# Patient Record
Sex: Male | Born: 1940 | Race: White | Hispanic: No | Marital: Married | Smoking: Former smoker
Health system: Southern US, Community
[De-identification: ages and names within clinical notes are randomized; demographics above are authoritative.]

## PROBLEM LIST (undated history)

## (undated) DIAGNOSIS — Z950 Presence of cardiac pacemaker: Secondary | ICD-10-CM

## (undated) DIAGNOSIS — K08109 Complete loss of teeth, unspecified cause, unspecified class: Secondary | ICD-10-CM

## (undated) DIAGNOSIS — Z95810 Presence of automatic (implantable) cardiac defibrillator: Secondary | ICD-10-CM

## (undated) DIAGNOSIS — I5189 Other ill-defined heart diseases: Secondary | ICD-10-CM

## (undated) DIAGNOSIS — N289 Disorder of kidney and ureter, unspecified: Secondary | ICD-10-CM

## (undated) DIAGNOSIS — E119 Type 2 diabetes mellitus without complications: Secondary | ICD-10-CM

## (undated) DIAGNOSIS — I10 Essential (primary) hypertension: Secondary | ICD-10-CM

## (undated) DIAGNOSIS — M1990 Unspecified osteoarthritis, unspecified site: Secondary | ICD-10-CM

## (undated) DIAGNOSIS — I639 Cerebral infarction, unspecified: Secondary | ICD-10-CM

## (undated) DIAGNOSIS — J449 Chronic obstructive pulmonary disease, unspecified: Secondary | ICD-10-CM

## (undated) DIAGNOSIS — C801 Malignant (primary) neoplasm, unspecified: Secondary | ICD-10-CM

## (undated) HISTORY — PX: THROAT SURGERY: SHX803

## (undated) HISTORY — PX: APPENDECTOMY: SHX54

---

## 2003-04-11 ENCOUNTER — Other Ambulatory Visit: Payer: Self-pay

## 2004-02-08 ENCOUNTER — Ambulatory Visit: Payer: Self-pay | Admitting: Family Medicine

## 2007-02-20 ENCOUNTER — Other Ambulatory Visit: Payer: Self-pay

## 2007-02-20 ENCOUNTER — Ambulatory Visit: Payer: Self-pay | Admitting: Internal Medicine

## 2007-02-20 ENCOUNTER — Inpatient Hospital Stay: Payer: Self-pay | Admitting: Internal Medicine

## 2007-04-18 ENCOUNTER — Ambulatory Visit: Payer: Self-pay | Admitting: Internal Medicine

## 2008-03-06 ENCOUNTER — Inpatient Hospital Stay: Payer: Self-pay | Admitting: Internal Medicine

## 2008-12-08 ENCOUNTER — Inpatient Hospital Stay: Payer: Self-pay | Admitting: Internal Medicine

## 2009-04-13 ENCOUNTER — Inpatient Hospital Stay: Payer: Self-pay | Admitting: Internal Medicine

## 2011-11-07 ENCOUNTER — Emergency Department: Payer: Self-pay | Admitting: *Deleted

## 2011-11-07 LAB — URINALYSIS, COMPLETE
Bacteria: NONE SEEN
Bilirubin,UR: NEGATIVE
Glucose,UR: 150 mg/dL (ref 0–75)
Ketone: NEGATIVE
Nitrite: NEGATIVE
Ph: 7 (ref 4.5–8.0)
Protein: 100
RBC,UR: <1 /HPF (ref 0–5)
Specific Gravity: 1.023 (ref 1.003–1.030)
WBC UR: <1 /HPF (ref 0–5)

## 2011-11-07 LAB — COMPREHENSIVE METABOLIC PANEL
Albumin: 3.6 g/dL (ref 3.4–5.0)
Alkaline Phosphatase: 98 U/L (ref 50–136)
BUN: 26 mg/dL — ABNORMAL HIGH (ref 7–18)
Bilirubin,Total: 0.2 mg/dL (ref 0.2–1.0)
Co2: 26 mmol/L (ref 21–32)
Creatinine: 1.94 mg/dL — ABNORMAL HIGH (ref 0.60–1.30)
EGFR (Non-African Amer.): 34 — ABNORMAL LOW
Glucose: 202 mg/dL — ABNORMAL HIGH (ref 65–99)
Osmolality: 292 (ref 275–301)
SGPT (ALT): 20 U/L
Sodium: 141 mmol/L (ref 136–145)
Total Protein: 7 g/dL (ref 6.4–8.2)

## 2011-11-07 LAB — CBC
HCT: 34.4 % — ABNORMAL LOW (ref 40.0–52.0)
HGB: 11.8 g/dL — ABNORMAL LOW (ref 13.0–18.0)
MCHC: 34.4 g/dL (ref 32.0–36.0)
MCV: 95 fL (ref 80–100)
RDW: 14 % (ref 11.5–14.5)
WBC: 8.7 10*3/uL (ref 3.8–10.6)

## 2012-12-08 ENCOUNTER — Emergency Department: Payer: Self-pay | Admitting: Emergency Medicine

## 2012-12-08 LAB — CBC
HGB: 11.4 g/dL — ABNORMAL LOW (ref 13.0–18.0)
MCH: 32.2 pg (ref 26.0–34.0)
MCHC: 35 g/dL (ref 32.0–36.0)
MCV: 92 fL (ref 80–100)
RBC: 3.55 10*6/uL — ABNORMAL LOW (ref 4.40–5.90)

## 2012-12-08 LAB — BASIC METABOLIC PANEL
Anion Gap: 6 — ABNORMAL LOW (ref 7–16)
BUN: 17 mg/dL (ref 7–18)
Chloride: 107 mmol/L (ref 98–107)
Creatinine: 1.62 mg/dL — ABNORMAL HIGH (ref 0.60–1.30)
Glucose: 167 mg/dL — ABNORMAL HIGH (ref 65–99)
Osmolality: 285 (ref 275–301)
Potassium: 4.3 mmol/L (ref 3.5–5.1)
Sodium: 140 mmol/L (ref 136–145)

## 2012-12-08 LAB — TROPONIN I
Troponin-I: <0.02 ng/mL
Troponin-I: <0.02 ng/mL

## 2013-02-16 ENCOUNTER — Observation Stay: Payer: Self-pay | Admitting: Student

## 2013-02-16 LAB — COMPREHENSIVE METABOLIC PANEL
Albumin: 3.1 g/dL — ABNORMAL LOW (ref 3.4–5.0)
Alkaline Phosphatase: 91 U/L (ref 50–136)
BUN: 18 mg/dL (ref 7–18)
Bilirubin,Total: 0.3 mg/dL (ref 0.2–1.0)
Calcium, Total: 8.2 mg/dL — ABNORMAL LOW (ref 8.5–10.1)
EGFR (African American): 48 — ABNORMAL LOW
EGFR (Non-African Amer.): 41 — ABNORMAL LOW
Glucose: 300 mg/dL — ABNORMAL HIGH (ref 65–99)
Osmolality: 291 (ref 275–301)
Potassium: 4.8 mmol/L (ref 3.5–5.1)
SGPT (ALT): 15 U/L (ref 12–78)
Sodium: 139 mmol/L (ref 136–145)
Total Protein: 6.1 g/dL — ABNORMAL LOW (ref 6.4–8.2)

## 2013-02-16 LAB — CBC
HCT: 34.8 % — ABNORMAL LOW (ref 40.0–52.0)
HGB: 11.8 g/dL — ABNORMAL LOW (ref 13.0–18.0)
MCHC: 33.9 g/dL (ref 32.0–36.0)
MCV: 95 fL (ref 80–100)
Platelet: 196 10*3/uL (ref 150–440)
RBC: 3.68 10*6/uL — ABNORMAL LOW (ref 4.40–5.90)
RDW: 14 % (ref 11.5–14.5)

## 2013-02-16 LAB — TROPONIN I: Troponin-I: <0.02 ng/mL

## 2013-02-17 LAB — CBC WITH DIFFERENTIAL/PLATELET
Basophil #: 0.1 10*3/uL (ref 0.0–0.1)
Basophil %: 1.2 %
Eosinophil %: 1.9 %
HGB: 10.9 g/dL — ABNORMAL LOW (ref 13.0–18.0)
Lymphocyte #: 1.4 10*3/uL (ref 1.0–3.6)
MCHC: 35.4 g/dL (ref 32.0–36.0)
MCV: 93 fL (ref 80–100)
Monocyte %: 8.8 %
Neutrophil #: 3.7 10*3/uL (ref 1.4–6.5)
RBC: 3.33 10*6/uL — ABNORMAL LOW (ref 4.40–5.90)
RDW: 13.9 % (ref 11.5–14.5)
WBC: 5.7 10*3/uL (ref 3.8–10.6)

## 2013-02-17 LAB — URINALYSIS, COMPLETE
Bacteria: NONE SEEN
Bilirubin,UR: NEGATIVE
Blood: NEGATIVE
Ketone: NEGATIVE
Leukocyte Esterase: NEGATIVE
Ph: 5 (ref 4.5–8.0)
Specific Gravity: 1.029 (ref 1.003–1.030)

## 2013-02-17 LAB — LIPID PANEL
Cholesterol: 105 mg/dL (ref 0–200)
HDL Cholesterol: 24 mg/dL — ABNORMAL LOW (ref 40–60)
Ldl Cholesterol, Calc: 47 mg/dL (ref 0–100)
Triglycerides: 171 mg/dL (ref 0–200)
VLDL Cholesterol, Calc: 34 mg/dL (ref 5–40)

## 2013-02-17 LAB — BASIC METABOLIC PANEL
Anion Gap: 7 (ref 7–16)
Chloride: 110 mmol/L — ABNORMAL HIGH (ref 98–107)
Creatinine: 1.44 mg/dL — ABNORMAL HIGH (ref 0.60–1.30)
EGFR (African American): 56 — ABNORMAL LOW
EGFR (Non-African Amer.): 48 — ABNORMAL LOW
Glucose: 166 mg/dL — ABNORMAL HIGH (ref 65–99)
Potassium: 3.9 mmol/L (ref 3.5–5.1)

## 2013-02-17 LAB — TSH: Thyroid Stimulating Horm: 1.08 u[IU]/mL

## 2013-02-17 LAB — HEMOGLOBIN A1C: Hemoglobin A1C: 9.2 % — ABNORMAL HIGH (ref 4.2–6.3)

## 2014-08-28 NOTE — Discharge Summary (Signed)
PATIENT NAME:  Phillip Reyes, Phillip Reyes MR#:  [id number] DATE OF BIRTH:  Apr 30, 1941  DATE OF ADMISSION:  02/16/2013 DATE OF DISCHARGE:   02/18/2013  CONSULTANTS: Dr. Melrose Nakayama from neurology, physical therapy.   PRIMARY CARE PHYSICIAN:  Dr. Brynda Greathouse.   CHIEF COMPLAINT:  Vertigo, nystagmus, ataxia, double vision.   DISCHARGE DIAGNOSES: 1.  Possible transient ischemic attack.  2.  Benign positional vertigo.  3.  Diabetes.  4.  Renal failure, likely chronic, stage III.  5.  Tobacco abuse.  6.  History of stroke in the past.  7.  Sinus bradycardia.  8.  Hypertension.  9.  Sleep apnea syndrome.  10.  Chronic lower back pain.  11.  Chronic obstructive pulmonary disease.  79.  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   DISCHARGE MEDICATIONS: Aggrenox 1 tab 2 times a day, loratadine 10 mg daily, atorvastatin 80 mg daily, temazepam 15 mg once a day, fludrocortisone 0.1 mg once a day, bisacodyl 2 tabs 5 mg delayed release at bedtime as needed for constipation, doxazosin 4 mg at bedtime, fluoxetine 40 mg once a day, gabapentin 400 mg 3 times a day, omeprazole 40 mg take 2 tabs 2 times a day, ropinirole 0.5 mg 2 tabs at bedtime, oxycodone 5 mg 2 tabs 3 times a day as needed for pain, Novolin R 16 units 3 times a day, Lantus 32 units at bedtime.   He will be going home with home health, PT, R.N.   DIET: Low sodium, low fat, low cholesterol, ADA diet.   ACTIVITY: As tolerated.   Please follow with PCP within 1 to 2 weeks.   DISPOSITION: Home.   CODE STATUS: Full code.   HISTORY OF PRESENT ILLNESS AND HOSPITAL COURSE: For full details of H and P, please see the dictation on October 12th by Dr. Berline Lopes, but briefly this is a 74 year old male with history of stroke with left-sided weakness, diabetes, hypertension, who came in with dizziness, double vision, headaches, unsteady gait. He also did have some headaches prior. He came into the hospital and had a CT of the head which was negative  for acute stroke, and given ongoing symptoms, was admitted to the hospitalist service. Stroke pathway was undertaken with MRI of the brain, ultrasound of the carotids and neurology was consulted as well. Lipids were checked, and the patient was continued on his Aggrenox and statin. MRI of the brain showed no acute stroke. Ultrasound of the carotids showed no significant stenosis. On arrival, he did have a creatinine of 1.63, which I believe is likely chronic. Lipids were checked, and LDL is within goal at 47. Hemoglobin A1c is high at 9.2 and his insulin regimen could be up titrated as an outpatient. He had a negative troponin. His TSH was 1.08. An echocardiogram was done as well showing EF of 50% to 55% and some mild aortic valve sclerosis. He was seen by PT and Dix-Hallpike  maneuver was performed, in addition to Epley maneuver. At this point, the patient has no  dizziness. He possibly had a TIA but no CVA was initially thought. Furthermore, the patient did have evidence for benign positional vertigo that did seem to resolve his symptoms through Dix-Hallpike maneuver and the Epley maneuver. At this point, he will be discharged with home health, PT per recommendation from physical therapy, in addition to a walker.   On the day of discharge, his temperature is 97.9, pulse rate is 51, respiratory rate 18, blood pressure 136/71 and orthostatic vitals were  negative. O2 sat is 97% on room air. Generally, the patient is an obese male, lying in bed, no obvious distress. Normocephalic, atraumatic. Pupils are equal and reactive. Sinus bradycardia on auscultation of the heart sounds, but the lungs are clear.  Abdomen is benign. Positive bowel sounds in all quadrants. He has no significant lower extremity edema.   At this point, he will be discharged with outpatient followup.   TOTAL TIME SPENT: 33 minutes.   ____________________________ Vivien Presto, MD sa:dmm D: 02/18/2013 12:47:45 ET T: 02/18/2013 13:04:31  ET JOB#: [id number]  cc: Vivien Presto, MD, <Dictator> Mikeal Hawthorne. Brynda Greathouse, MD Vivien Presto MD ELECTRONICALLY SIGNED 03/07/2013 14:51

## 2014-08-28 NOTE — H&P (Signed)
PATIENT NAME:  Phillip Reyes, Phillip Reyes MR#:  [id number] DATE OF BIRTH:  [birth year]  DATE OF ADMISSION:  02/16/2013  PRIMARY CARE PHYSICIAN:  Dr. Nicky Pugh.   REFERRING PHYSICIAN: Dr. Jasmine December.  CHIEF COMPLAINT:  Vertigo, nystagmus, ataxia and diplopia.   HISTORY OF PRESENT ILLNESS: The patient is a 74 year old Caucasian male with past medical history of old CVA with left-sided weakness, diabetes mellitus type 2, hypertension and other medical problems. He was brought into the ER, as he was feeling dizzy, associated with double vision, headache and unsteady gait. At around 6 to 7 p.m., this evening, the patient suddenly started feeling like the room was spinning around him. He almost passed out, but did not completely lose his consciousness. He was feeling dizzy, associated with headache. His vision was foggy and seeing double. His speech was normal, but he was unable to walk normally. Gait was unsteady, and he was swaying toward the right side. As he had similar complaints during his previous stroke, the patient was immediately brought into the ER. CAT scan of the head has revealed no acute findings. The patient is still feeling dizzy and has blurry vision. Denies any difficulty in swallowing or disturbed speech. No new weakness in the upper or lower extremities. Denies any chest pain or shortness of breath. No other complaints.   PAST MEDICAL HISTORY: Hypertension, sleep apnea syndrome, diabetes mellitus type 2, chronic low back pain, COPD, still smokes.  Old history of CVA with left-sided weakness.   PAST SURGICAL HISTORY: UPPT surgery for sleep apnea syndrome.   ALLERGIES: THE PATIENT IS ALLERGIC TO VICODIN.   PSYCHOSOCIAL HISTORY: Lives at home with his wife. Still smokes half pack a day. Denies alcohol or illicit drug usage.   FAMILY HISTORY: Father deceased at age 25 from congestive heart failure. Brother is healthy.   HOME MEDICATIONS: Temazepam 15 mg once daily, omeprazole 40 mg four times a  day, loratadine 10 mg once daily, gabapentin 400 mg 3 times a day, fluoxetine 40 mg once daily, fludrocortisone  0.1 mg once daily, doxazosin 4 mg once a day, bisacodyl 5 mg once daily, atorvastatin 80 mg once daily, aspirin 81 mg once a day, Aggrenox 25-200 mg one capsule 2 times a day.   REVIEW OF SYSTEMS: CONSTITUTIONAL:  Denies any fever or fatigue.  EYES: Complaining of blurry vision and double vision. Denies glaucoma.  ENT: Denies any tinnitus, epistaxis.  RESPIRATION: Denies cough. Has chronic smoking history. Denies any dyspnea.  CARDIOVASCULAR: Denies any chest pain or shortness of breath.  GASTROINTESTINAL: Denies nausea, vomiting, diarrhea, abdominal pain.  GENITOURINARY: No dysuria, hematuria.  ENDOCRINE: Denies polyuria, nocturia. Has chronic diabetes mellitus. HEMATOLOGIC AND LYMPHATIC: Denies anemia, easy bruising, or bleeding.  INTEGUMENTARY: No acne, rash, lesions.  MUSCULOSKELETAL: No joint pain in the neck and back. Denies gout. NEUROLOGIC:  Complaining of vertigo, ataxia, headache.  Denies any memory loss.  Has old history of stroke.  PSYCHIATRIC: Denies ADD, OCD.   PHYSICAL EXAMINATION:  VITAL SIGNS: Temperature 98 degrees Fahrenheit, pulse 46, respirations 18, blood pressure 186/78, pulse oximetry 99%.  GENERAL APPEARANCE: Not in acute distress. Moderately built and nourished.  HEENT: Normocephalic, atraumatic. Pupils are equally reacting to light and accommodation. No scleral icterus. No conjunctival injection. Positive nystagmus. Extraocular movements are intact; Positive nystagmus, both left and right side. No sinus tenderness. No postnasal drip. Moist mucous membranes.  External auditory canals are intact.  NECK: Supple. No JVD or thyromegaly. Range of motion is intact.  LUNGS: Clear to auscultation bilaterally.  No accessory muscle use and no anterior chest wall tenderness on palpation.  CARDIAC: S1, S2 normal. Regular rate and rhythm. No peripheral  edema. GASTROINTESTINAL: Soft. Bowel sounds are positive in all four quadrants. Nontender, nondistended. No hepatosplenomegaly. No masses felt. NEUROLOGIC: Awake, alert, oriented x3. Cranial nerves II through XII are grossly intact. Finger-nose test past pointing. Reflexes are 2+. Positive chronic left lower extremity weakness, but other extremities motor and sensory are grossly intact. Abnormal gait, with positive ataxia to the right side.  MUSCULOSKELETAL: No joint effusion, tenderness, erythema.  SKIN: Warm. Within normal limits. No rashes. No lesions.  PSYCHIATRIC: Normal mood and affect.   LABORATORY AND IMAGING STUDIES: CAT scan of the head: No acute findings. LFTs: Total protein 6.1, albumin is low at 3.1. The rest of the LFTs are normal. Troponin less than 0.02. CBC is normal except for hemoglobin at 11.8 and hematocrit 34.8. BMP: Glucose 300, BUN 18, creatinine 1.63, sodium 139, potassium 4.8, chloride 108, CO2 27, GFR 41, anion gap 4. Serum osmolality 291, calcium is 8.2. The patient's baseline creatinine is at 1.6.   ASSESSMENT AND PLAN: A 74 year old Caucasian male, brought into the ER with a chief complaint of vertigo, nystagmus, diplopia, ataxia, and headache, will be admitted with the following assessment and plan.  1.  Near syncope with diplopia, nystagmus, ataxia and vertigo.  Rule out stroke. Will admit him to telemetry. We will do a stroke work-up with MRA of the brain, carotid Dopplers and 2-D echocardiogram. Continue Aggrenox. PT consult. Bedside swallow evaluation. If it is normal, we will resume him on low salt, diabetic diet.  2.  Sinus bradycardia.  Will monitor him on telemetry. The patient is asymptomatic. Troponins are negative. The patient is currently not on any rate-limiting drugs.  3.  Diabetes mellitus: If the patient passes bedside swallow evaluation, will resume his home medications and also provide him with sliding scale insulin for stress induced hyperglycemia.  4.   Hypertension. Blood pressure is elevated. We will allow permissive hypertension, as there is a concern for stroke. Will monitor closely and resume his home medications.  5.  Old history of stroke, with chronic left lower extremity weakness. Continue Aggrenox.  6.  It looks like the patient has baseline renal insufficiency. Will monitor renal function closely.  7.  We will provide him GI and deep vein thrombosis prophylaxis.  The patient is full code. Wife is medical power of attorney.   Diagnosis and plan of care was discussed in detail with the patient. He is aware of the plan.   Total time spent on the admission is 45 minutes.   ____________________________ Nicholes Mango, MD ag:cg D: 02/16/2013 21:10:00 ET T: 02/16/2013 23:39:53 ET  JOB#: [id number]  cc: Mikeal Hawthorne. Brynda Greathouse, MD Nicholes Mango MD ELECTRONICALLY SIGNED 03/07/2013 1:37

## 2014-08-28 NOTE — Consult Note (Signed)
Referring Physician:  Nicholes Mango :   Reason for Consult: Admit Date: 16-Feb-2013  Chief Complaint: Vertigo, nystagmus, ataxia and diplopia.  Reason for Consult: Blurred/double vision   History of Present Illness: History of Present Illness:   74 year old man with history of prior stroke resulting left sided weakness and vasculopathic risk factors presents today with new symptoms of abrupt onset diplopia, headache, unsteady ambulation and worsening left arm and leg weakness.  Symptoms occurred yesterday evening.  Felt abrupt onset dizziness/vertigo.  Developed headache.  Noticed that he was having difficulty walking, staggering towards the right.  Occurred upon standing up immediately after eating per his wife today who is the primary historian.  Taken to ED immediately and HCT unremarkable.  Symptoms have persisted since that time.  No speech problems or facial droop.  Symptoms persistent.  Moderate severity.  Nothing made symptoms better or worse. MEDICAL HISTORY: apnea syndrome mellitus type 2 low back pain smokes  history of CVA with left-sided weakness  SURGICAL HISTORY: surgery for sleep apnea syndrome.   SOCIAL HISTORY:at home with his wife. smokes half pack a day.alcohol or illicit drug usage.  HISTORY: deceased at age 34 from congestive heart failure. Brother is healthy.  MEDICATIONS: 15 mg once daily, 40 mg four times a day,10 mg once daily, 400 mg 3 times a day,40 mg once daily, 0.1 mg once daily, 4 mg once a day, 5 mg once daily,80 mg once daily, aspirin 81 mg once a day, 25-200 mg one capsule 2 times a day.   ALLERGIES:     ROS:  General denies complaints   HEENT no complaints   Lungs no complaints   Cardiac no complaints   GI no complaints   GU no complaints   Musculoskeletal no complaints   Extremities no complaints   Skin no complaints   Endocrine no complaints   Psych no complaints   Past Medical/Surgical Hx:  Depression:   Sleep Apnea: surgical  intervention  Pneumonia:   Syncope:   CVA x2:   copd:   htn:   diabetes:   Home Medications: Medication Instructions Last Modified Date/Time  Aggrenox 25 mg-200 mg oral capsule, extended release 1 cap(s) orally 2 times a day 12-Oct-14 21:41  loratadine 10 mg oral tablet 1 tab(s) orally once a day 12-Oct-14 21:41  atorvastatin 80 mg oral tablet 1 tab(s) orally once a day (at bedtime) 12-Oct-14 21:41  temazepam 15 mg oral capsule 1 cap(s) orally once a day (at bedtime) 12-Oct-14 21:41  fludrocortisone 0.1 mg oral tablet 1 tab(s) orally once a day 12-Oct-14 21:41  aspirin 81 mg oral tablet, chewable 1 tab(s) orally once a day 12-Oct-14 21:41  bisacodyl 5 mg oral delayed release tablet 2 tabs (48m) orally once a day (at bedtime) as needed for constipation. 12-Oct-14 21:41  doxazosin 4 mg oral tablet 1 tab(s) orally once a day (at bedtime) 12-Oct-14 21:41  FLUoxetine 20 mg oral capsule 2 caps (461m orally once a day for anxiety/nerves/depression. 12-Oct-14 21:41  gabapentin 400 mg oral capsule 1 cap(s) orally 3 times a day 12-Oct-14 21:41  omeprazole 40 mg oral delayed release capsule 2 caps (8070morally 2 times a day 12-Oct-14 21:41  rOPINIRole 0.5 mg oral tablet 2 tabs (1mg11mrally once a day (at bedtime). 12-Oct-14 21:41  oxyCODONE 5 mg oral tablet 2 tabs (10mg13mally 3 times a day as needed for pain. 12-Oct-14 21:41  NovoLIN R human recombinant 100 units/mL injectable solution 16 unit(s) subcutaneous 3 times a  day 12-Oct-14 21:41  Lantus 100 units/mL subcutaneous solution 32 unit(s) subcutaneous once a day (at bedtime) 12-Oct-14 21:41   Allergies:  Vicodin: Resp. Distress  Vital Signs: **Vital Signs.:   13-Oct-14 08:29  Vital Signs Type Routine  Temperature Temperature (F) 97.8  Celsius 36.5  Temperature Source oral  Pulse Pulse 54  Respirations Respirations 17  Systolic BP Systolic BP 235  Diastolic BP (mmHg) Diastolic BP (mmHg) 71  Mean BP 100  Pulse Ox % Pulse Ox % 98   Pulse Ox Activity Level  At rest  Oxygen Delivery Room Air/ 21 %   EXAM: PHYSICAL EXAMINATION  GENERAL: Pleasant.  NAD.  Normocephalic and atraumatic.  EYES: Funduscopic exam shows normal disc size, appearance and C/D ratio without clear evidence of papilledema.  CARDIOVASCULAR: S1 and S2 sounds are within normal limits, without murmurs, gallops, or rubs.  MUSCULOSKELETAL: Bulk - Normal Tone - Normal Pronator Drift - Present in LUE. Ambulation - Gait and station are deferred due to falls precautions.  R/L 5/4    Shoulder abduction (deltoid/supraspinatus, axillary/suprascapular n, C5) 5/4    Elbow flexion (biceps brachii, musculoskeletal n, C5-6) 5/4    Elbow extension (triceps, radial n, C7) 5/4    Finger adduction (interossei, ulnar n, T1)   5/4    Hip flexion (iliopsoas, L1/L2) 5/4    Knee flexion (hamstrings, sciatic n, L5/S1) 5/4    Knee extension (quadriceps, femoral n, L3/4) 5/4    Ankle dorsiflexion (tibialis anterior, deep fibular n, L4/5) 5/4    Ankle plantarflexion (gastroc, tibial n, S1)  NEUROLOGICAL: MENTAL STATUS: Patient is oriented to person, place and time.  Recent and remote memory are intact.  Attention span and concentration are intact.  Naming, repetition, comprehension and expressive speech are within normal limits.  Patient's fund of knowledge is within normal limits for educational level.  CRANIAL NERVES: Normal    CN II (normal visual acuity and visual fields) Normal    CN III, IV, VI (extraocular muscles are intact) Normal    CN V (facial sensation is intact bilaterally) Normal    CN VII (facial strength is intact bilaterally) Normal    CN VIII (hearing is intact bilaterally) Normal    CN IX/X (palate elevates midline, normal phonation) Normal    CN XI (shoulder shrug strength is normal and symmetric) Normal    CN XII (tongue protrudes midline)   SENSATION: Intact to pain and temp bilaterally (spinothalamic tracts) Intact to position and  vibration bilaterally (dorsal columns)   REFLEXES: R/L 2+/2+    Biceps 2+/2+    Brachioradialis   2+/2+    Patellar 2+/2+    Achilles   COORDINATION/CEREBELLAR: Finger to nose testing is within normal limits..  Lab Results:  Thyroid:  13-Oct-14 04:17   Thyroid Stimulating Hormone 1.08 (0.45-4.50 (International Unit)  ----------------------- Pregnant patients have  different reference  ranges for TSH:  - - - - - - - - - -  Pregnant, first trimetser:  0.36 - 2.50 uIU/mL)  Hepatic:  12-Oct-14 18:47   Bilirubin, Total 0.3  Alkaline Phosphatase 91  SGPT (ALT) 15  SGOT (AST) 17  Total Protein, Serum  6.1  Albumin, Serum  3.1  Routine Chem:  13-Oct-14 04:17   Cholesterol, Serum 105  Triglycerides, Serum 171  HDL (INHOUSE)  24  VLDL Cholesterol Calculated 34  LDL Cholesterol Calculated 47 (Result(s) reported on 17 Feb 2013 at 04:59AM.)  Glucose, Serum  166  BUN 18  Creatinine (comp)  1.44  Sodium, Serum 142  Potassium, Serum 3.9  Chloride, Serum  110  CO2, Serum 25  Calcium (Total), Serum  8.0  Anion Gap 7  Osmolality (calc) 289  eGFR (African American)  56  eGFR (Non-African American)  48 (eGFR values <10m/min/1.73 m2 may be an indication of chronic kidney disease (CKD). Calculated eGFR is useful in patients with stable renal function. The eGFR calculation will not be reliable in acutely ill patients when serum creatinine is changing rapidly. It is not useful in  patients on dialysis. The eGFR calculation may not be applicable to patients at the low and high extremes of body sizes, pregnant women, and vegetarians.)  Magnesium, Serum  1.6 (1.8-2.4 THERAPEUTIC RANGE: 4-7 mg/dL TOXIC: > 10 mg/dL  -----------------------)  Hemoglobin A1c ([facility])  9.2 (The American Diabetes Association recommends that a primary goal of therapy should be <7% and that physicians should reevaluate the treatment regimen in patients with HbA1c values consistently >8%.)  Cardiac:   12-Oct-14 18:47   Troponin I < 0.02 (0.00-0.05 0.05 ng/mL or less: NEGATIVE  Repeat testing in 3-6 hrs  if clinically indicated. >0.05 ng/mL: POTENTIAL  MYOCARDIAL INJURY. Repeat  testing in 3-6 hrs if  clinically indicated. NOTE: An increase or decrease  of 30% or more on serial  testing suggests a  clinically important change)  Routine UA:  13-Oct-14 00:13   Color (UA) Yellow  Clarity (UA) Clear  Glucose (UA) >=500  Bilirubin (UA) Negative  Ketones (UA) Negative  Specific Gravity (UA) 1.029  Blood (UA) Negative  pH (UA) 5.0  Protein (UA) 100 mg/dL  Nitrite (UA) Negative  Leukocyte Esterase (UA) Negative (Result(s) reported on 17 Feb 2013 at 12:35AM.)  RBC (UA) <1 /HPF  WBC (UA) <1 /HPF  Bacteria (UA) NONE SEEN  Epithelial Cells (UA) NONE SEEN  Mucous (UA) PRESENT (Result(s) reported on 17 Feb 2013 at 12:35AM.)  Routine Hem:  13-Oct-14 04:17   WBC (CBC) 5.7  RBC (CBC)  3.33  Hemoglobin (CBC)  10.9  Hematocrit (CBC)  30.9  Platelet Count (CBC) 159  MCV 93  MCH 32.8  MCHC 35.4  RDW 13.9  Neutrophil % 64.1  Lymphocyte % 24.0  Monocyte % 8.8  Eosinophil % 1.9  Basophil % 1.2  Neutrophil # 3.7  Lymphocyte # 1.4  Monocyte # 0.5  Eosinophil # 0.1  Basophil # 0.1 (Result(s) reported on 17 Feb 2013 at 04:41AM.)   Radiology Results:  [place]    13-Oct-14 15:47, [place]  [place]ilateral   REASON FOR EXAM:    cva  COMMENTS:       PROCEDURE: [place] - [place] DOPPL[place] BILATERAL  - Feb 17 2013  3:47PM     RESULT:     Findings:  Visual evaluation of the right carotid system demonstrates   intimal thickening within the common carotid artery,carotid bulb and   internal carotid artery demonstrating less than 50% stenosis. Similar   findings are identified on the left.    Color flow and spectral waveform imaging is unremarkable within the right   and left carotid systems.  ICA/CCA ratios:    RIGHT: 1.37     LEFT:   0.97    Antegrade flow is identified within the vertebral arteries.    IMPRESSION:  No sonographic evidence of hemodynamically significant   stenosis within the right and left carotid systems.       Thank you for this opportunity to contribute to the care of  your patient.       Verified By: Mikki Santee, M.D., MD  MRI:    13-Oct-14 16:24, MRI Brain Without Contrast  MRI Brain Without Contrast   REASON FOR EXAM:    CVA  COMMENTS:       PROCEDURE: MR  - MR BRAIN WO CONTRAST  - Feb 17 2013  4:24PM     RESULT: History: CVA.    Comparison Study: Head CT of 02/16/2013. MRI brain 04/13/2009.    Findings: Multiplanar, multisequence images brain is obtained.   Diffusion-weighted images reveal no evidence of acute ischemia. Vascular   flow voids are normal. White matter changes are present suggesting   chronic ischemia.    IMPRESSION:  No acute abnormality identified. No evidence of acute     ischemia. Chronic white matter ischemic change noted, these changes are   mild.        Verified By: Osa Craver, M.D., MD  CT:    12-Oct-14 19:06, CT Head Without Contrast  CT Head Without Contrast   REASON FOR EXAM:    Weakness  COMMENTS:       PROCEDURE: CT  - CT HEAD WITHOUT CONTRAST  - Feb 16 2013  7:06PM     RESULT: Axial noncontrast CT scanning was performed through the brain   with reconstructions at 5 mm intervals and slice thicknesses. Comparison   is made to study of April 13, 2009.    The ventricles are normal in size and position. There is no intracranial   hemorrhage nor intracranial mass effect. Is very mild age-appropriate   diffuse cerebral atrophy. The cerebellum and brainstem are normal in   density. 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 At bone window settings the observed portions of the paranasal   sinuses and mastoid air cells are clear.  IMPRESSION:  There is no acute intracranial abnormality.    A preliminary  report was sent to the emergency department at the   conclusion of the study.     Dictation Site: 1        Verified By: DAVID A. Martinique, M.D., MD   Impression/Recommendations: Recommendations:   74 year old man with history of prior stroke resulting left sided weakness and vasculopathic risk factors presents today with new symptoms of abrupt onset diplopia, headache, unsteady ambulation and worsening left arm and leg weakness.   Patient with constellation of abrupt onset abnormal neurologic symptoms immediately upon standing up yesterday evening after a meal.  Abrupt onset dizziness may have represented an episode of severe orthostatic hypotension.  May have represented abrupt onset BPPV.  In any case, despite complaint of subjectively worse LUE and LLE weakness since event, patients Brain MRI is negative for stroke or other abnormalities.  Carotid ultrasound is wnl.  HCT unremarkable.  At this point, symptoms unlikely to be due to stroke though cannot entirely rule out TIA.  On admission, glucose at 300 indicating symptoms possibly result of hyperglycemia. on Aggrenox at same dose given concern for TIA, carotids and Brain MRI negative.orthostatic vital signs.and OT given complaint of difficulty walking upon admission with subjectively worse left sided weakness.followup to eval in more detail for possibility of BPPV.   have reviewed the results of the most recent imaging studies, tests and labs as outlined above and answered all related questions.  have personally viewed the patient's Brain MRI and it is unremarkable. and coordinated plan of care with hospitalist.   Electronic Signatures:  Anabel Bene (MD)  (Signed 14-Oct-14 00:33)  Authored: REFERRING PHYSICIAN, Consult, History of Present Illness, Review of Systems, PAST MEDICAL/SURGICAL HISTORY, HOME MEDICATIONS, ALLERGIES, NURSING VITAL SIGNS, Physical Exam-, LAB RESULTS, RADIOLOGY RESULTS, Recommendations   Last Updated: 14-Oct-14  00:33 by Anabel Bene (MD)

## 2015-06-11 ENCOUNTER — Emergency Department: Payer: Medicare HMO

## 2015-06-11 ENCOUNTER — Encounter: Payer: Self-pay | Admitting: Emergency Medicine

## 2015-06-11 ENCOUNTER — Inpatient Hospital Stay
Admission: EM | Admit: 2015-06-11 | Discharge: 2015-06-13 | DRG: 071 | Disposition: A | Discharge status: Home Health | Payer: Medicare HMO | Attending: Internal Medicine | Admitting: Internal Medicine

## 2015-06-11 DIAGNOSIS — Z9119 Patient's noncompliance with other medical treatment and regimen: Secondary | ICD-10-CM

## 2015-06-11 DIAGNOSIS — J449 Chronic obstructive pulmonary disease, unspecified: Secondary | ICD-10-CM | POA: Diagnosis present

## 2015-06-11 DIAGNOSIS — Z7951 Long term (current) use of inhaled steroids: Secondary | ICD-10-CM

## 2015-06-11 DIAGNOSIS — N183 Chronic kidney disease, stage 3 unspecified: Secondary | ICD-10-CM | POA: Diagnosis present

## 2015-06-11 DIAGNOSIS — Z794 Long term (current) use of insulin: Secondary | ICD-10-CM

## 2015-06-11 DIAGNOSIS — Z7902 Long term (current) use of antithrombotics/antiplatelets: Secondary | ICD-10-CM | POA: Diagnosis not present

## 2015-06-11 DIAGNOSIS — Z8673 Personal history of transient ischemic attack (TIA), and cerebral infarction without residual deficits: Secondary | ICD-10-CM

## 2015-06-11 DIAGNOSIS — G9340 Encephalopathy, unspecified: Secondary | ICD-10-CM | POA: Diagnosis present

## 2015-06-11 DIAGNOSIS — Z9114 Patient's other noncompliance with medication regimen: Secondary | ICD-10-CM | POA: Diagnosis not present

## 2015-06-11 DIAGNOSIS — I10 Essential (primary) hypertension: Secondary | ICD-10-CM | POA: Diagnosis present

## 2015-06-11 DIAGNOSIS — Z8546 Personal history of malignant neoplasm of prostate: Secondary | ICD-10-CM | POA: Diagnosis not present

## 2015-06-11 DIAGNOSIS — Z79899 Other long term (current) drug therapy: Secondary | ICD-10-CM | POA: Diagnosis not present

## 2015-06-11 DIAGNOSIS — E860 Dehydration: Secondary | ICD-10-CM | POA: Diagnosis present

## 2015-06-11 DIAGNOSIS — R4182 Altered mental status, unspecified: Secondary | ICD-10-CM | POA: Diagnosis present

## 2015-06-11 DIAGNOSIS — I129 Hypertensive chronic kidney disease with stage 1 through stage 4 chronic kidney disease, or unspecified chronic kidney disease: Secondary | ICD-10-CM | POA: Diagnosis present

## 2015-06-11 DIAGNOSIS — E785 Hyperlipidemia, unspecified: Secondary | ICD-10-CM | POA: Diagnosis present

## 2015-06-11 DIAGNOSIS — R42 Dizziness and giddiness: Secondary | ICD-10-CM

## 2015-06-11 DIAGNOSIS — F17210 Nicotine dependence, cigarettes, uncomplicated: Secondary | ICD-10-CM | POA: Diagnosis present

## 2015-06-11 DIAGNOSIS — M1990 Unspecified osteoarthritis, unspecified site: Secondary | ICD-10-CM | POA: Diagnosis present

## 2015-06-11 DIAGNOSIS — G459 Transient cerebral ischemic attack, unspecified: Secondary | ICD-10-CM | POA: Diagnosis not present

## 2015-06-11 DIAGNOSIS — E1122 Type 2 diabetes mellitus with diabetic chronic kidney disease: Secondary | ICD-10-CM | POA: Diagnosis present

## 2015-06-11 DIAGNOSIS — F329 Major depressive disorder, single episode, unspecified: Secondary | ICD-10-CM | POA: Diagnosis present

## 2015-06-11 DIAGNOSIS — E2749 Other adrenocortical insufficiency: Secondary | ICD-10-CM | POA: Diagnosis present

## 2015-06-11 DIAGNOSIS — E119 Type 2 diabetes mellitus without complications: Secondary | ICD-10-CM

## 2015-06-11 HISTORY — DX: Unspecified osteoarthritis, unspecified site: M19.90

## 2015-06-11 HISTORY — DX: Malignant (primary) neoplasm, unspecified: C80.1

## 2015-06-11 HISTORY — DX: Disorder of kidney and ureter, unspecified: N28.9

## 2015-06-11 HISTORY — DX: Essential (primary) hypertension: I10

## 2015-06-11 HISTORY — DX: Type 2 diabetes mellitus without complications: E11.9

## 2015-06-11 HISTORY — DX: Chronic obstructive pulmonary disease, unspecified: J44.9

## 2015-06-11 HISTORY — DX: Cerebral infarction, unspecified: I63.9

## 2015-06-11 LAB — HEPATIC FUNCTION PANEL
ALBUMIN: 3.1 g/dL — AB (ref 3.5–5.0)
ALT: 17 U/L (ref 17–63)
AST: 17 U/L (ref 15–41)
Alkaline Phosphatase: 67 U/L (ref 38–126)
BILIRUBIN INDIRECT: 0.4 mg/dL (ref 0.3–0.9)
Bilirubin, Direct: 0.1 mg/dL (ref 0.1–0.5)
TOTAL PROTEIN: 6 g/dL — AB (ref 6.5–8.1)
Total Bilirubin: 0.5 mg/dL (ref 0.3–1.2)

## 2015-06-11 LAB — BLOOD GAS, ARTERIAL
ACID-BASE EXCESS: 0.3 mmol/L (ref 0.0–3.0)
Allens test (pass/fail): POSITIVE — AB
Bicarbonate: 26 mEq/L (ref 21.0–28.0)
FIO2: 0.21
O2 SAT: 93.8 %
PATIENT TEMPERATURE: 37
pCO2 arterial: 45 mmHg (ref 32.0–48.0)
pH, Arterial: 7.37 (ref 7.350–7.450)
pO2, Arterial: 72 mmHg — ABNORMAL LOW (ref 83.0–108.0)

## 2015-06-11 LAB — CBC
HEMATOCRIT: 38.1 % — AB (ref 40.0–52.0)
HEMOGLOBIN: 13.3 g/dL (ref 13.0–18.0)
MCH: 31.6 pg (ref 26.0–34.0)
MCHC: 34.9 g/dL (ref 32.0–36.0)
MCV: 90.4 fL (ref 80.0–100.0)
Platelets: 165 10*3/uL (ref 150–440)
RBC: 4.21 MIL/uL — ABNORMAL LOW (ref 4.40–5.90)
RDW: 13.4 % (ref 11.5–14.5)
WBC: 5.2 10*3/uL (ref 3.8–10.6)

## 2015-06-11 LAB — TSH: TSH: 0.62 u[IU]/mL (ref 0.350–4.500)

## 2015-06-11 LAB — BASIC METABOLIC PANEL
ANION GAP: 8 (ref 5–15)
BUN: 24 mg/dL — ABNORMAL HIGH (ref 6–20)
CALCIUM: 8.6 mg/dL — AB (ref 8.9–10.3)
CHLORIDE: 104 mmol/L (ref 101–111)
CO2: 25 mmol/L (ref 22–32)
Creatinine, Ser: 1.5 mg/dL — ABNORMAL HIGH (ref 0.61–1.24)
GFR calc non Af Amer: 44 mL/min — ABNORMAL LOW (ref >60)
GFR, EST AFRICAN AMERICAN: 51 mL/min — AB (ref >60)
Glucose, Bld: 297 mg/dL — ABNORMAL HIGH (ref 65–99)
POTASSIUM: 3.8 mmol/L (ref 3.5–5.1)
Sodium: 137 mmol/L (ref 135–145)

## 2015-06-11 LAB — GLUCOSE, CAPILLARY: Glucose-Capillary: 278 mg/dL — ABNORMAL HIGH (ref 65–99)

## 2015-06-11 LAB — BRAIN NATRIURETIC PEPTIDE: B NATRIURETIC PEPTIDE 5: 28 pg/mL (ref 0.0–100.0)

## 2015-06-11 LAB — URINALYSIS COMPLETE WITH MICROSCOPIC (ARMC ONLY)
Bilirubin Urine: NEGATIVE
Glucose, UA: >500 mg/dL — AB
HGB URINE DIPSTICK: NEGATIVE
Ketones, ur: NEGATIVE mg/dL
Leukocytes, UA: NEGATIVE
NITRITE: NEGATIVE
PROTEIN: 100 mg/dL — AB
SPECIFIC GRAVITY, URINE: 1.022 (ref 1.005–1.030)
Squamous Epithelial / LPF: NONE SEEN
WBC UA: NONE SEEN WBC/hpf (ref 0–5)
pH: 5 (ref 5.0–8.0)

## 2015-06-11 LAB — CARBOXYHEMOGLOBIN
Carboxyhemoglobin: 2.3 % (ref 1.5–9.0)
Methemoglobin: 1.1 %
O2 SAT: 96.2 %
TOTAL OXYGEN CONTENT: 93 mL/dL

## 2015-06-11 LAB — TROPONIN I

## 2015-06-11 MED ORDER — FLUOXETINE HCL 20 MG PO CAPS
40.0000 mg | ORAL_CAPSULE | Freq: Every day | ORAL | Status: DC
  Administered 2015-06-12 – 2015-06-13 (×2): 40 mg via ORAL
  Filled 2015-06-11 (×2): qty 2

## 2015-06-11 MED ORDER — ATORVASTATIN CALCIUM 20 MG PO TABS
80.0000 mg | ORAL_TABLET | Freq: Every day | ORAL | Status: DC
  Administered 2015-06-12 (×2): 80 mg via ORAL
  Filled 2015-06-11 (×2): qty 4

## 2015-06-11 MED ORDER — IPRATROPIUM-ALBUTEROL 0.5-2.5 (3) MG/3ML IN SOLN
3.0000 mL | Freq: Once | RESPIRATORY_TRACT | Status: AC
  Administered 2015-06-11: 3 mL via RESPIRATORY_TRACT
  Filled 2015-06-11: qty 3

## 2015-06-11 MED ORDER — SODIUM CHLORIDE 0.9 % IV SOLN
Freq: Once | INTRAVENOUS | Status: AC
  Administered 2015-06-11: 20:00:00 via INTRAVENOUS

## 2015-06-11 MED ORDER — CLOPIDOGREL BISULFATE 75 MG PO TABS
75.0000 mg | ORAL_TABLET | Freq: Every day | ORAL | Status: DC
  Administered 2015-06-12 – 2015-06-13 (×2): 75 mg via ORAL
  Filled 2015-06-11 (×2): qty 1

## 2015-06-11 MED ORDER — ENOXAPARIN SODIUM 40 MG/0.4ML ~~LOC~~ SOLN
40.0000 mg | Freq: Every day | SUBCUTANEOUS | Status: DC
  Administered 2015-06-12 (×2): 40 mg via SUBCUTANEOUS
  Filled 2015-06-11 (×2): qty 0.4

## 2015-06-11 MED ORDER — INSULIN ASPART 100 UNIT/ML ~~LOC~~ SOLN
0.0000 [IU] | Freq: Every day | SUBCUTANEOUS | Status: DC
  Administered 2015-06-12: 2 [IU] via SUBCUTANEOUS
  Administered 2015-06-12: 4 [IU] via SUBCUTANEOUS
  Filled 2015-06-11: qty 4
  Filled 2015-06-11: qty 2

## 2015-06-11 MED ORDER — IOHEXOL 350 MG/ML SOLN
75.0000 mL | Freq: Once | INTRAVENOUS | Status: AC | PRN
  Administered 2015-06-11: 75 mL via INTRAVENOUS

## 2015-06-11 MED ORDER — INSULIN ASPART 100 UNIT/ML ~~LOC~~ SOLN
0.0000 [IU] | Freq: Three times a day (TID) | SUBCUTANEOUS | Status: DC
  Administered 2015-06-12 (×2): 3 [IU] via SUBCUTANEOUS
  Administered 2015-06-12: 5 [IU] via SUBCUTANEOUS
  Administered 2015-06-13: 2 [IU] via SUBCUTANEOUS
  Administered 2015-06-13: 5 [IU] via SUBCUTANEOUS
  Filled 2015-06-11: qty 5
  Filled 2015-06-11 (×2): qty 3
  Filled 2015-06-11: qty 5
  Filled 2015-06-11: qty 3
  Filled 2015-06-11: qty 2

## 2015-06-11 MED ORDER — IPRATROPIUM-ALBUTEROL 0.5-2.5 (3) MG/3ML IN SOLN: 3.0000 mL | Freq: Four times a day (QID) | RESPIRATORY_TRACT | Status: DC | PRN

## 2015-06-11 MED ORDER — STROKE: EARLY STAGES OF RECOVERY BOOK
Freq: Once | Status: AC
  Administered 2015-06-11: 1

## 2015-06-11 NOTE — ED Notes (Signed)
For discharge call Phillip Reyes.  Phone number [phone].

## 2015-06-11 NOTE — H&P (Signed)
[facility] [place] NAME: Phillip Reyes    MR#:  [id number]  DATE OF BIRTH:  06-Jun-1940  DATE OF ADMISSION:  06/11/2015  PRIMARY CARE PHYSICIAN: No primary care provider on file.   REQUESTING/REFERRING PHYSICIAN: Cinda Quest, MD  CHIEF COMPLAINT:   Chief Complaint  Patient presents with  . Weakness    HISTORY OF PRESENT ILLNESS:  Phillip Reyes  is a 75 y.o. male who presents with acute encephalopathy. Patient is only able to provide a limited amount of his history due to the fact that he does not remember most of the events of today. He is accompanied by his neighbors who provide most of the history. Patient lives at home with his wife, who requires home health nursing.  He home health nurse states that when she arrive to pts home today he was still asleep - this was around 3-4 pm.  Pts neighbors state that this is extremely atypical, as pt cares for his bedridden wife and is very diligent.  EMS was called and pat was brought to ED.  He was still altered here, though with stable vitals except elevated BP.  He slowly became more alert, but does not recall any of the events leading to his being brought to ED.  Initial workup is largely benign.  He does report a significant history of prior strokes.  These were typically involving motor function of his left side, which he does not have any issue with today.  He does report sensory deficit in his left hand.  Hospitalists were called for admission and further evaluation.  PAST MEDICAL HISTORY:   Past Medical History  Diagnosis Date  . Diabetes mellitus without complication ([place])   . Cancer ([place])   . COPD (chronic obstructive pulmonary disease) ([place])   . Hypertension   . Renal disorder   . Stroke ([place])   . Arthritis     PAST SURGICAL HISTORY:   Past Surgical History  Procedure Laterality Date  . Appendectomy      SOCIAL HISTORY:   Social History  Substance Use Topics  . Smoking  status: Current Every Day Smoker  . Smokeless tobacco: Not on file  . Alcohol Use: No    FAMILY HISTORY:   Family History  Problem Relation Age of Onset  . Congestive Heart Failure Father     DRUG ALLERGIES:  No Known Allergies  MEDICATIONS AT HOME:   Prior to Admission medications   Medication Sig Start Date End Date Taking? Authorizing Provider  albuterol (PROVENTIL) (2.5 MG/3ML) 0.083% nebulizer solution Take 2.5 mg by nebulization every 6 (six) hours as needed for wheezing or shortness of breath.   Yes Historical Provider, MD  amitriptyline (ELAVIL) 25 MG tablet Take 25 mg by mouth at bedtime.   Yes Historical Provider, MD  atorvastatin (LIPITOR) 80 MG tablet Take 80 mg by mouth at bedtime.   Yes Historical Provider, MD  cholecalciferol (VITAMIN D) 1000 units tablet Take 2,000 Units by mouth daily.   Yes Historical Provider, MD  clopidogrel (PLAVIX) 75 MG tablet Take 75 mg by mouth daily.   Yes Historical Provider, MD  doxazosin (CARDURA) 8 MG tablet Take 8 mg by mouth at bedtime.   Yes Historical Provider, MD  fludrocortisone (FLORINEF) 0.1 MG tablet Take 0.1 mg by mouth daily.   Yes Historical Provider, MD  FLUoxetine (PROZAC) 20 MG capsule Take 40 mg by mouth daily.   Yes Historical Provider, MD  gabapentin (NEURONTIN)  400 MG capsule Take 400 mg by mouth 3 (three) times daily.   Yes Historical Provider, MD  hydrocerin (EUCERIN) CREA Apply 1 application topically 2 (two) times daily.   Yes Historical Provider, MD  hydroxypropyl methylcellulose / hypromellose (ISOPTO TEARS / GONIOVISC) 2.5 % ophthalmic solution Place 1 drop into both eyes as needed for dry eyes.   Yes Historical Provider, MD  insulin NPH Human (HUMULIN N,NOVOLIN N) 100 UNIT/ML injection Inject 40 Units into the skin at bedtime.   Yes Historical Provider, MD  insulin regular (NOVOLIN R,HUMULIN R) 100 units/mL injection Inject 16 Units into the skin 3 (three) times daily with meals.   Yes Historical Provider, MD   ipratropium (ATROVENT) 0.02 % nebulizer solution Take 0.5 mg by nebulization every 6 (six) hours as needed for wheezing or shortness of breath.   Yes Historical Provider, MD  Ipratropium-Albuterol (COMBIVENT RESPIMAT) 20-100 MCG/ACT AERS respimat Inhale 1 puff into the lungs every 6 (six) hours as needed for wheezing or shortness of breath.   Yes Historical Provider, MD  loratadine (CLARITIN) 10 MG tablet Take 10 mg by mouth daily.   Yes Historical Provider, MD  ondansetron (ZOFRAN) 8 MG tablet Take 4 mg by mouth every 8 (eight) hours as needed for nausea or vomiting.   Yes Historical Provider, MD  vitamin B-12 (CYANOCOBALAMIN) 1000 MCG tablet Take 1,000 mcg by mouth daily.   Yes Historical Provider, MD    REVIEW OF SYSTEMS:  Review of Systems  Constitutional: Negative for fever, chills, weight loss and malaise/fatigue.  HENT: Negative for ear pain, hearing loss and tinnitus.   Eyes: Negative for blurred vision, double vision, pain and redness.  Respiratory: Negative for cough, hemoptysis and shortness of breath.   Cardiovascular: Negative for chest pain, palpitations, orthopnea and leg swelling.  Gastrointestinal: Negative for nausea, vomiting, abdominal pain, diarrhea and constipation.  Genitourinary: Negative for dysuria, frequency and hematuria.  Musculoskeletal: Negative for back pain, joint pain and neck pain.  Skin:       No acne, rash, or lesions  Neurological: Positive for sensory change. Negative for dizziness, tremors, focal weakness and weakness.       Lethargy, unresponsiveness  Endo/Heme/Allergies: Negative for polydipsia. Does not bruise/bleed easily.  Psychiatric/Behavioral: Negative for depression. The patient is not nervous/anxious and does not have insomnia.      VITAL SIGNS:   Filed Vitals:   06/11/15 1800 06/11/15 1830 06/11/15 1933 06/11/15 2140  BP: 166/75 156/97 106/78 168/75  Pulse: 55 56 68 59  Temp:      TempSrc:      Resp: 19 10 16 18   Height:       Weight:      SpO2: 98% 100% 100% 98%   Wt Readings from Last 3 Encounters:  06/11/15 82.464 kg (181 lb 12.8 oz)    PHYSICAL EXAMINATION:  Physical Exam  Vitals reviewed. Constitutional: He is oriented to person, place, and time. He appears well-developed and well-nourished. No distress.  HENT:  Head: Normocephalic and atraumatic.  Mouth/Throat: Oropharynx is clear and moist.  Eyes: Conjunctivae and EOM are normal. Pupils are equal, round, and reactive to light. No scleral icterus.  Neck: Normal range of motion. Neck supple. No JVD present. No thyromegaly present.  Cardiovascular: Normal rate, regular rhythm and intact distal pulses.  Exam reveals no gallop and no friction rub.   No murmur heard. Respiratory: Effort normal and breath sounds normal. No respiratory distress. He has no wheezes. He has no rales.  GI:  Soft. Bowel sounds are normal. He exhibits no distension. There is no tenderness.  Musculoskeletal: Normal range of motion. He exhibits no edema.  No arthritis, no gout  Lymphadenopathy:    He has no cervical adenopathy.  Neurological: He is alert and oriented to person, place, and time. No cranial nerve deficit.  Neurologic: Cranial nerves II-XII intact, Sensation intact to light touch/pinprick except feeling of numbness to light touch distal left upper extremity, 5/5 strength in all extremities, no dysarthria, no aphasia, no dysphagia, memory intact, no pronator drift, DTR intact, Babinski sign not present.   Skin: Skin is warm and dry. No rash noted. No erythema.  Psychiatric: He has a normal mood and affect. His behavior is normal. Judgment and thought content normal.    LABORATORY PANEL:   CBC  Recent Labs Lab 06/11/15 1753  WBC 5.2  HGB 13.3  HCT 38.1*  PLT 165   ------------------------------------------------------------------------------------------------------------------  Chemistries   Recent Labs Lab 06/11/15 1753  NA 137  K 3.8  CL 104  CO2  25  GLUCOSE 297*  BUN 24*  CREATININE 1.50*  CALCIUM 8.6*  AST 17  ALT 17  ALKPHOS 67  BILITOT 0.5   ------------------------------------------------------------------------------------------------------------------  Cardiac Enzymes  Recent Labs Lab 06/11/15 1753  TROPONINI <0.03   ------------------------------------------------------------------------------------------------------------------  RADIOLOGY:  Ct Head Wo Contrast  06/11/2015  CLINICAL DATA:  Altered mental status. Nonproductive cough. Weakness and lethargy. EXAM: CT HEAD WITHOUT CONTRAST TECHNIQUE: Contiguous axial images were obtained from the base of the skull through the vertex without intravenous contrast. COMPARISON:  02/16/2013 FINDINGS: Mild generalized atrophy and chronic small vessel ischemia.No intracranial hemorrhage, mass effect, or midline shift. No hydrocephalus. The basilar cisterns are patent. No evidence of territorial infarct. No intracranial fluid collection. Calvarium is intact. Included paranasal sinuses and mastoid air cells are well aerated. IMPRESSION: No acute intracranial abnormality. Stable atrophy and chronic small vessel ischemia. Electronically Signed   By: Jeb Levering M.D.   On: 06/11/2015 19:07   Ct Angio Chest Pe W/cm &/or Wo Cm  06/11/2015  CLINICAL DATA:  Weakness and lethargy. EXAM: CT ANGIOGRAPHY CHEST WITH CONTRAST TECHNIQUE: Multidetector CT imaging of the chest was performed using the standard protocol during bolus administration of intravenous contrast. Multiplanar CT image reconstructions and MIPs were obtained to evaluate the vascular anatomy. CONTRAST:  39mL OMNIPAQUE IOHEXOL 350 MG/ML SOLN COMPARISON:  None FINDINGS: Mediastinum: The heart size is normal. There is aortic atherosclerosis as well as LAD coronary artery calcifications. The trachea is patent and midline. Normal appearance of the esophagus. No axillary or supraclavicular adenopathy. The main pulmonary artery is  patent. No saddle embolus identified. There is no lobar or segmental pulmonary artery filling defects identified. Lungs/Pleura: No pleural fluid identified. No airspace consolidation or atelectasis identified. Mild changes of paraseptal emphysema. Diffuse bronchial wall thickening noted. Upper Abdomen: There is diffuse hepatic steatosis noted. No focal liver abnormality. The adrenal glands are unremarkable. The visualized portions of the spleen normal. Musculoskeletal: Mild multi level degenerative disc disease is identified within the thoracic spine. No aggressive lytic or sclerotic bone lesions identified. Review of the MIP images confirms the above findings. IMPRESSION: 1. No acute cardiopulmonary abnormalities. No evidence for acute pulmonary embolus. 2. Aortic atherosclerosis and LAD coronary artery calcification 3. Diffuse bronchial wall thickening with emphysema, as above; imaging findings suggestive of underlying COPD. Electronically Signed   By: Kerby Moors M.D.   On: 06/11/2015 19:09    EKG:   Orders placed or performed during the  hospital encounter of 06/11/15  . ED EKG  . ED EKG  . EKG 12-Lead  . EKG 12-Lead  . ED EKG  . ED EKG    IMPRESSION AND PLAN:  Principal Problem:   Acute encephalopathy - no history consistent with seizure,no active sign of any infection, suspicion for possible recurrent.will admit for workup for stroke.monitor on telemetry tonight, rehydrate with gentle fluids. Active Problems:   COPD ([place]) - continue home meds   Type 2 diabetes mellitus (HCC) - sliding scale insulin with corresponding glucose checks andheart healthy/carb modified diet   HTN (hypertension) - permissive hypertension tonight goal less than 220/110.   H/O: stroke - reports for prior strokes. No permanent residual deficits. Workup for stroke as above.   CKD (chronic kidney disease), stage III - monitor, currently at baseline avoid nephrotoxins  All the records are reviewed and case  discussed with ED provider. Management plans discussed with the patient and/or family.  DVT PROPHYLAXIS: SubQ lovenox  GI PROPHYLAXIS: None  ADMISSION STATUS: Inpatient  CODE STATUS: Full Code Status History    This patient does not have a recorded code status. Please follow your organizational policy for patients in this situation.    Advance Directive Documentation        Most Recent Value   Type of Advance Directive  Living will   Pre-existing out of facility DNR order (yellow form or pink MOST form)     "MOST" Form in Place?        TOTAL TIME TAKING CARE OF THIS PATIENT: 50 minutes.    Marise Knapper Magnet 06/11/2015, 10:16 PM  [facility]  Office  [phone]  CC: Primary care physician; No primary care provider on file.

## 2015-06-11 NOTE — ED Notes (Signed)
Patient brought in by [facility] from home for c/o weakness and lethargy. Patient was seen at the [facility] recently for cough but says he was not diagnosed with anything. Per EMS when they arrived patient was in bed, BP lying was [id number] systolic, when he stood it dropped to 0000000 systolic. Patient was given 300cc bolus of NS

## 2015-06-11 NOTE — ED Provider Notes (Signed)
[facility] Emergency Department Provider Note  ____________________________________________  Time seen: Approximately 6:04 PM  I have reviewed the triage vital signs and the nursing notes.   HISTORY  Chief Complaint Weakness    HPI Phillip Reyes is a 75 y.o. male patient reports he feels weak and has no energy. Patient's been having a productive cough bringing up yellow phlegm for about a month. Patient really doesn't feel too short of breath. Patient was seen at the [facility] given a pill to help with the cough was not an antibiotic. Patient has history of hypertension COPD. Patient still smokes. Patient reports he may be feeling a little depressed since his wife had a stroke recently and she stuck in the bed and he has a caring for her.  Home health med alert went to check on the patient's wife today and found her in bed. But said that he was lethargic and not responding well so they called Adult Protective Services who sent the gentleman here to be evaluated.   Neighbors arrived at approximately 7 PM. The neighbors say that last night the patient had a blood sugar in the 500s HEENT shot went to bed and went up to this afternoon when home health got there his sugar was 300+. Patient was still not acting right. They report now the patient still not quite himself talking his speech is much more quiet than usual he is a again hasn't reached back to his baseline. He has a history of prostate cancer which she has told his wife is in remission. They report he sometimes does not tell his wife everything so as not to worry her since she's had the stroke. Patient reports she usually has high blood pressure but cannot tell me what it usually runs. Patient does not remember what blood pressure medication he takes.  Past Medical History  Diagnosis Date  . Diabetes mellitus without complication ([place])   . Cancer ([place])   . COPD (chronic obstructive pulmonary disease) ([place])   .  Hypertension   . Renal disorder   . Stroke ([place])   . Arthritis     Patient Active Problem List   Diagnosis Date Noted  . Acute encephalopathy 06/11/2015  . Type 2 diabetes mellitus ([place]) 06/11/2015  . HTN (hypertension) 06/11/2015  . COPD (chronic obstructive pulmonary disease) ([place]) 06/11/2015  . CKD (chronic kidney disease), stage III 06/11/2015  . H/O: stroke 06/11/2015    Past Surgical History  Procedure Laterality Date  . Appendectomy      Current Outpatient Rx  Name  Route  Sig  Dispense  Refill  . albuterol (PROVENTIL) (2.5 MG/3ML) 0.083% nebulizer solution   Nebulization   Take 2.5 mg by nebulization every 6 (six) hours as needed for wheezing or shortness of breath.         Marland Kitchen amitriptyline (ELAVIL) 25 MG tablet   Oral   Take 25 mg by mouth at bedtime.         Marland Kitchen atorvastatin (LIPITOR) 80 MG tablet   Oral   Take 80 mg by mouth at bedtime.         . benzonatate (TESSALON) 100 MG capsule   Oral   Take by mouth 3 (three) times daily as needed for cough.         . cholecalciferol (VITAMIN D) 1000 units tablet   Oral   Take 2,000 Units by mouth daily.         . clopidogrel (PLAVIX) 75 MG tablet  Oral   Take 75 mg by mouth daily.         . cyclobenzaprine (FLEXERIL) 10 MG tablet   Oral   Take 10 mg by mouth 3 (three) times daily as needed for muscle spasms.         Marland Kitchen dipyridamole-aspirin (AGGRENOX) 200-25 MG 12hr capsule   Oral   Take 1 capsule by mouth 2 (two) times daily.         Marland Kitchen doxazosin (CARDURA) 8 MG tablet   Oral   Take 8 mg by mouth at bedtime.         . fludrocortisone (FLORINEF) 0.1 MG tablet   Oral   Take 0.1 mg by mouth daily.         Marland Kitchen FLUoxetine (PROZAC) 20 MG capsule   Oral   Take 40 mg by mouth daily.         Marland Kitchen gabapentin (NEURONTIN) 400 MG capsule   Oral   Take 400 mg by mouth 3 (three) times daily.         . hydrocerin (EUCERIN) CREA   Topical   Apply 1 application topically 2 (two) times daily.          . hydroxypropyl methylcellulose / hypromellose (ISOPTO TEARS / GONIOVISC) 2.5 % ophthalmic solution   Both Eyes   Place 1 drop into both eyes as needed for dry eyes.         . insulin NPH Human (HUMULIN N,NOVOLIN N) 100 UNIT/ML injection   Subcutaneous   Inject 40 Units into the skin at bedtime.         . insulin regular (NOVOLIN R,HUMULIN R) 100 units/mL injection   Subcutaneous   Inject 16 Units into the skin 3 (three) times daily with meals.         Marland Kitchen ipratropium (ATROVENT) 0.02 % nebulizer solution   Nebulization   Take 0.5 mg by nebulization every 6 (six) hours as needed for wheezing or shortness of breath.         . Ipratropium-Albuterol (COMBIVENT RESPIMAT) 20-100 MCG/ACT AERS respimat   Inhalation   Inhale 1 puff into the lungs every 6 (six) hours as needed for wheezing or shortness of breath.         . loratadine (CLARITIN) 10 MG tablet   Oral   Take 10 mg by mouth daily.         . ondansetron (ZOFRAN) 8 MG tablet   Oral   Take 4 mg by mouth every 8 (eight) hours as needed for nausea or vomiting.         . vitamin B-12 (CYANOCOBALAMIN) 1000 MCG tablet   Oral   Take 1,000 mcg by mouth daily.           Allergies Review of patient's allergies indicates no known allergies.  Family History  Problem Relation Age of Onset  . Congestive Heart Failure Father     Social History Social History  Substance Use Topics  . Smoking status: Current Every Day Smoker  . Smokeless tobacco: None  . Alcohol Use: No    Review of Systems Constitutional: No fever/chills Eyes: No visual changes. ENT: No sore throat. Cardiovascular: Denies chest pain. Respiratory: Denies shortness of breath. Gastrointestinal: No abdominal pain.  No nausea, no vomiting.  No diarrhea.  No constipation. Genitourinary: Negative for dysuria. Musculoskeletal: Negative for back pain. Skin: Negative for rash. Neurological: Negative for headaches, focal weakness or  numbness.  10-point ROS otherwise negative.  ____________________________________________  PHYSICAL EXAM:  VITAL SIGNS: ED Triage Vitals  Enc Vitals Group     BP 06/11/15 1734 188/70 mmHg     Pulse --      Resp 06/11/15 1734 14     Temp 06/11/15 1734 97.7 F (36.5 C)     Temp Source 06/11/15 1734 Oral     SpO2 06/11/15 1730 96 %     Weight 06/11/15 1734 181 lb 12.8 oz (82.464 kg)     Height 06/11/15 1734 5\' 7"  (1.702 m)     Head Cir --      Peak Flow --      Pain Score 06/11/15 1736 0     Pain Loc --      Pain Edu? --      Excl. in [place]? --     Constitutional: Alert and oriented. Well appearing and in no acute distress. Eyes: Conjunctivae are normal. PERRL. EOMI. Head: Atraumatic. Nose: No congestion/rhinnorhea. Mouth/Throat: Mucous membranes are moist.  Oropharynx non-erythematous. Neck: No stridor.   Cardiovascular: Normal rate, regular rhythm. Grossly normal heart sounds.  Good peripheral circulation. Respiratory: Normal respiratory effort.  No retractions. Lungs scattered wheezes. Gastrointestinal: Soft and nontender. No distention. No abdominal bruits. No CVA tenderness. Musculoskeletal: No lower extremity tenderness nor edema.  No joint effusions. Neurologic:  Normal speech and language. No gross focal neurologic deficits are appreciated. No gait instability. Skin:  Skin is warm, dry and intact. No rash noted. Psychiatric: Mood and affect are normal. Speech and behavior are normal.  ____________________________________________   LABS (all labs ordered are listed, but only abnormal results are displayed)  Labs Reviewed  BASIC METABOLIC PANEL - Abnormal; Notable for the following:    Glucose, Bld 297 (*)    BUN 24 (*)    Creatinine, Ser 1.50 (*)    Calcium 8.6 (*)    GFR calc non Af Amer 44 (*)    GFR calc Af Amer 51 (*)    All other components within normal limits  CBC - Abnormal; Notable for the following:    RBC 4.21 (*)    HCT 38.1 (*)    All other  components within normal limits  GLUCOSE, CAPILLARY - Abnormal; Notable for the following:    Glucose-Capillary 278 (*)    All other components within normal limits  URINALYSIS COMPLETEWITH MICROSCOPIC ([facility] ONLY) - Abnormal; Notable for the following:    Color, Urine YELLOW (*)    APPearance CLEAR (*)    Glucose, UA >500 (*)    Protein, ur 100 (*)    Bacteria, UA RARE (*)    All other components within normal limits  HEPATIC FUNCTION PANEL - Abnormal; Notable for the following:    Total Protein 6.0 (*)    Albumin 3.1 (*)    All other components within normal limits  BLOOD GAS, ARTERIAL - Abnormal; Notable for the following:    pO2, Arterial 72 (*)    [place] test (pass/fail) POSITIVE (*)    All other components within normal limits  BRAIN NATRIURETIC PEPTIDE  TROPONIN I  TSH  CARBOXYHEMOGLOBIN  CBG MONITORING, ED   ____________________________________________  EKG  EKG read and interpreted by me shows eyes bradycardia at a rate of 56 left axis abnormal R-wave progression which may be due to lead placement nonspecific ST-T wave changes ____________________________________________  RADIOLOGY  CT of the chest and head read by radiology as no acute disease CT of the chest only shows bronchitis ____________________________________________   PROCEDURES  ____________________________________________   INITIAL IMPRESSION / ASSESSMENT AND PLAN / ED COURSE  Pertinent labs & imaging results that were available during my care of the patient were reviewed by me and considered in my medical decision making (see chart for details).   ____________________________________________   FINAL CLINICAL IMPRESSION(S) / ED DIAGNOSES  Final diagnoses:  Altered mental status, unspecified altered mental status type      Nena Polio, MD 06/11/15 [phone]

## 2015-06-11 NOTE — ED Notes (Signed)
AAOx3.  Skin warm and dry.  NAD.  Continue to monitor.

## 2015-06-11 NOTE — ED Notes (Signed)
Dinner tray given to patient.  Patient tolerating well.

## 2015-06-12 ENCOUNTER — Inpatient Hospital Stay: Payer: Medicare HMO

## 2015-06-12 ENCOUNTER — Inpatient Hospital Stay (HOSPITAL_COMMUNITY)
Admit: 2015-06-12 | Discharge: 2015-06-12 | Disposition: A | Discharge status: Home / Self Care | Payer: Medicare HMO | Attending: Internal Medicine | Admitting: Internal Medicine

## 2015-06-12 DIAGNOSIS — G459 Transient cerebral ischemic attack, unspecified: Secondary | ICD-10-CM

## 2015-06-12 LAB — GLUCOSE, CAPILLARY
GLUCOSE-CAPILLARY: 214 mg/dL — AB (ref 65–99)
GLUCOSE-CAPILLARY: 283 mg/dL — AB (ref 65–99)
GLUCOSE-CAPILLARY: 322 mg/dL — AB (ref 65–99)
Glucose-Capillary: 246 mg/dL — ABNORMAL HIGH (ref 65–99)
Glucose-Capillary: 236 mg/dL — ABNORMAL HIGH (ref 65–99)

## 2015-06-12 LAB — HEMOGLOBIN A1C: HEMOGLOBIN A1C: 15.2 % — AB (ref 4.0–6.0)

## 2015-06-12 LAB — LIPID PANEL
CHOL/HDL RATIO: 7.3 ratio
CHOLESTEROL: 167 mg/dL (ref 0–200)
HDL: 23 mg/dL — AB (ref >40)
LDL Cholesterol: UNDETERMINED mg/dL (ref 0–99)
TRIGLYCERIDES: 544 mg/dL — AB (ref <150)
VLDL: UNDETERMINED mg/dL (ref 0–40)

## 2015-06-12 MED ORDER — DOXAZOSIN MESYLATE 4 MG PO TABS
8.0000 mg | ORAL_TABLET | Freq: Every day | ORAL | Status: DC
  Administered 2015-06-12: 8 mg via ORAL
  Filled 2015-06-12: qty 2

## 2015-06-12 MED ORDER — FLUDROCORTISONE ACETATE 0.1 MG PO TABS
0.1000 mg | ORAL_TABLET | Freq: Every day | ORAL | Status: DC
Start: 2015-06-12 — End: 2015-06-13
  Administered 2015-06-12 – 2015-06-13 (×2): 0.1 mg via ORAL
  Filled 2015-06-12 (×2): qty 1

## 2015-06-12 MED ORDER — MECLIZINE HCL 12.5 MG PO TABS: 25.0000 mg | ORAL_TABLET | Freq: Two times a day (BID) | ORAL | Status: DC | PRN

## 2015-06-12 MED ORDER — AMITRIPTYLINE HCL 50 MG PO TABS
25.0000 mg | ORAL_TABLET | Freq: Every day | ORAL | Status: DC
  Administered 2015-06-12: 25 mg via ORAL
  Filled 2015-06-12: qty 1

## 2015-06-12 MED ORDER — OXYCODONE-ACETAMINOPHEN 5-325 MG PO TABS
1.0000 | ORAL_TABLET | Freq: Three times a day (TID) | ORAL | Status: DC | PRN
Start: 2015-06-12 — End: 2015-06-13
  Administered 2015-06-12: 1 via ORAL
  Filled 2015-06-12: qty 1

## 2015-06-12 MED ORDER — HYDROCODONE-ACETAMINOPHEN 5-325 MG PO TABS
1.0000 | ORAL_TABLET | Freq: Four times a day (QID) | ORAL | Status: DC | PRN
  Filled 2015-06-12: qty 1

## 2015-06-12 MED ORDER — SODIUM CHLORIDE 0.9 % IV BOLUS (SEPSIS)
500.0000 mL | Freq: Once | INTRAVENOUS | Status: AC
  Administered 2015-06-12: 500 mL via INTRAVENOUS

## 2015-06-12 NOTE — Progress Notes (Signed)
[facility] [place] NAME: Phillip Reyes    MR#:  [id number]  DATE OF BIRTH:  10/01/1940  SUBJECTIVE: Admitted for acute encephalopathy, for possible stroke. Patient stopped taking all his medications for the past 1-2 weeks. Family is very alert and oriented. Has some weakness on the left side due to previous strokes. Denies any recent sickness. Complains of dizziness more with  Head movement.   CHIEF COMPLAINT:   Chief Complaint  Patient presents with  . Weakness    REVIEW OF SYSTEMS:    Review of Systems  Constitutional: Negative for fever and chills.  HENT: Negative for hearing loss.   Eyes: Negative for blurred vision, double vision and photophobia.  Respiratory: Negative for cough, hemoptysis and shortness of breath.   Cardiovascular: Negative for palpitations, orthopnea and leg swelling.  Gastrointestinal: Negative for vomiting, abdominal pain and diarrhea.  Genitourinary: Negative for dysuria and urgency.  Musculoskeletal: Negative for myalgias and neck pain.  Skin: Negative for rash.  Neurological: Negative for dizziness, focal weakness, seizures, weakness and headaches.  Psychiatric/Behavioral: Negative for memory loss. The patient does not have insomnia.     Nutrition:  Tolerating Diet: Tolerating PT:      DRUG ALLERGIES:  No Known Allergies  VITALS:  Blood pressure 147/75, pulse 69, temperature 98.1 F (36.7 C), temperature source Oral, resp. rate 16, height 5\' 7"  (1.702 m), weight 90.084 kg (198 lb 9.6 oz), SpO2 97 %.  PHYSICAL EXAMINATION:   Physical Exam  GENERAL:  75 y.o.-year-old patient lying in the bed with no acute distress.  EYES: Pupils equal, round, reactive to light and accommodation. No scleral icterus. Extraocular muscles intact.  HEENT: Head atraumatic, normocephalic. Oropharynx and nasopharynx clear.  NECK:  Supple, no jugular venous distention. No thyroid enlargement, no tenderness.   LUNGS: Normal breath sounds bilaterally, no wheezing, rales,rhonchi or crepitation. No use of accessory muscles of respiration.  CARDIOVASCULAR: S1, S2 normal. No murmurs, rubs, or gallops.  ABDOMEN: Soft, nontender, nondistended. Bowel sounds present. No organomegaly or mass.  EXTREMITIES: No pedal edema, cyanosis, or clubbing.  NEUROLOGIC: Cranial nerves II through XII are intact. Muscle strength 5/5 in all extremities. Sensation intact. Gait not checked.  PSYCHIATRIC: The patient is alert and oriented x 3.  SKIN: No obvious rash, lesion, or ulcer.    LABORATORY PANEL:   CBC  Recent Labs Lab 06/11/15 1753  WBC 5.2  HGB 13.3  HCT 38.1*  PLT 165   ------------------------------------------------------------------------------------------------------------------  Chemistries   Recent Labs Lab 06/11/15 1753  NA 137  K 3.8  CL 104  CO2 25  GLUCOSE 297*  BUN 24*  CREATININE 1.50*  CALCIUM 8.6*  AST 17  ALT 17  ALKPHOS 67  BILITOT 0.5   ------------------------------------------------------------------------------------------------------------------  Cardiac Enzymes  Recent Labs Lab 06/11/15 1753  TROPONINI <0.03   ------------------------------------------------------------------------------------------------------------------  RADIOLOGY:  Ct Head Wo Contrast  06/11/2015  CLINICAL DATA:  Altered mental status. Nonproductive cough. Weakness and lethargy. EXAM: CT HEAD WITHOUT CONTRAST TECHNIQUE: Contiguous axial images were obtained from the base of the skull through the vertex without intravenous contrast. COMPARISON:  02/16/2013 FINDINGS: Mild generalized atrophy and chronic small vessel ischemia.No intracranial hemorrhage, mass effect, or midline shift. No hydrocephalus. The basilar cisterns are patent. No evidence of territorial infarct. No intracranial fluid collection. Calvarium is intact. Included paranasal sinuses and mastoid air cells are well aerated.  IMPRESSION: No acute intracranial abnormality. Stable atrophy and chronic small vessel ischemia. Electronically Signed  By: Jeb Levering M.D.   On: 06/11/2015 19:07   Ct Angio Chest Pe W/cm &/or Wo Cm  06/11/2015  CLINICAL DATA:  Weakness and lethargy. EXAM: CT ANGIOGRAPHY CHEST WITH CONTRAST TECHNIQUE: Multidetector CT imaging of the chest was performed using the standard protocol during bolus administration of intravenous contrast. Multiplanar CT image reconstructions and MIPs were obtained to evaluate the vascular anatomy. CONTRAST:  44mL OMNIPAQUE IOHEXOL 350 MG/ML SOLN COMPARISON:  None FINDINGS: Mediastinum: The heart size is normal. There is aortic atherosclerosis as well as LAD coronary artery calcifications. The trachea is patent and midline. Normal appearance of the esophagus. No axillary or supraclavicular adenopathy. The main pulmonary artery is patent. No saddle embolus identified. There is no lobar or segmental pulmonary artery filling defects identified. Lungs/Pleura: No pleural fluid identified. No airspace consolidation or atelectasis identified. Mild changes of paraseptal emphysema. Diffuse bronchial wall thickening noted. Upper Abdomen: There is diffuse hepatic steatosis noted. No focal liver abnormality. The adrenal glands are unremarkable. The visualized portions of the spleen normal. Musculoskeletal: Mild multi level degenerative disc disease is identified within the thoracic spine. No aggressive lytic or sclerotic bone lesions identified. Review of the MIP images confirms the above findings. IMPRESSION: 1. No acute cardiopulmonary abnormalities. No evidence for acute pulmonary embolus. 2. Aortic atherosclerosis and LAD coronary artery calcification 3. Diffuse bronchial wall thickening with emphysema, as above; imaging findings suggestive of underlying COPD. Electronically Signed   By: Kerby Moors M.D.   On: 06/11/2015 19:09   Mr Brain Wo Contrast  06/12/2015  CLINICAL DATA:   75 year old male, found with decreased mental status at home. Acute encephalopathy. Initial encounter. EXAM: MRI HEAD WITHOUT CONTRAST MRA HEAD WITHOUT CONTRAST TECHNIQUE: Multiplanar, multiecho pulse sequences of the brain and surrounding structures were obtained without intravenous contrast. Angiographic images of the head were obtained using MRA technique without contrast. COMPARISON:  Head CT without contrast 06/11/2015. Brain MRI 06/03/2008 FINDINGS: MRI HEAD FINDINGS Major intracranial vascular flow voids are stable. Cerebral volume has not significantly changed since 2010. No restricted diffusion to suggest acute infarction. No midline shift, mass effect, evidence of mass lesion, ventriculomegaly, extra-axial collection or acute intracranial hemorrhage. Cervicomedullary junction and pituitary are within normal limits. Negative visualized cervical spine. Chronic lacunar infarct in the right thalamus is unchanged since 2010. Suggestion of chronic micro hemorrhage in the right parietal lobe (series 14, image 16). Otherwise gray and white matter signal is stable and within normal limits for age throughout the brain. Visible internal auditory structures appear normal. Mastoids are clear. Trace paranasal sinus mucosal thickening, improved since 2010. Negative orbit and scalp soft tissues. Visualized bone marrow signal is within normal limits. MRA HEAD FINDINGS Antegrade flow in the posterior circulation with codominant distal vertebral arteries. Fenestration of the distal left vertebral artery (normal variant). Normal right PICA origin. Patent vertebrobasilar junction. Patent dominant appearing left AICA origin. No basilar stenosis. SCA and left PCA origins are normal. Fetal type right PCA origin. Small left posterior communicating artery. Bilateral PCA branches are within normal limits. Antegrade flow in both ICA siphons. Moderate to severe irregularity in the distal cavernous segment of the right siphon best seen  on series 104, image 5. Moderate stenosis there. Mild stenosis of the left siphon at the anterior genu. Normal ophthalmic and posterior communicating artery origins. Patent carotid termini. Dominant left ACA A1 segment. MCA and ACA origins appear within normal limits. Anterior communicating artery and visualized bilateral ACA branches are within normal limits. Left MCA M1 segment, bifurcation,  and left MCA branches are within normal limits. Right MCA M1 segment, bifurcation, and visualized right MCA branches are within normal limits. IMPRESSION: *No acute intracranial abnormality. 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 *Negative posterior circulation. Bilateral ICA siphon atherosclerosis, moderate to severe in the distal right cavernous segment with subsequent moderate stenosis. Otherwise negative anterior circulation. Electronically Signed   By: Genevie Ann M.D.   On: 06/12/2015 11:39   Mr Mra Head/brain Wo Cm  06/12/2015  CLINICAL DATA:  75 year old male, found with decreased mental status at home. Acute encephalopathy. Initial encounter. EXAM: MRI HEAD WITHOUT CONTRAST MRA HEAD WITHOUT CONTRAST TECHNIQUE: Multiplanar, multiecho pulse sequences of the brain and surrounding structures were obtained without intravenous contrast. Angiographic images of the head were obtained using MRA technique without contrast. COMPARISON:  Head CT without contrast 06/11/2015. Brain MRI 06/03/2008 FINDINGS: MRI HEAD FINDINGS Major intracranial vascular flow voids are stable. Cerebral volume has not significantly changed since 2010. No restricted diffusion to suggest acute infarction. No midline shift, mass effect, evidence of mass lesion, ventriculomegaly, extra-axial collection or acute intracranial hemorrhage. Cervicomedullary junction and pituitary are within normal limits. Negative visualized cervical spine. Chronic lacunar infarct in the right thalamus is unchanged since 2010. Suggestion of chronic micro  hemorrhage in the right parietal lobe (series 14, image 16). Otherwise gray and white matter signal is stable and within normal limits for age throughout the brain. Visible internal auditory structures appear normal. Mastoids are clear. Trace paranasal sinus mucosal thickening, improved since 2010. Negative orbit and scalp soft tissues. Visualized bone marrow signal is within normal limits. MRA HEAD FINDINGS Antegrade flow in the posterior circulation with codominant distal vertebral arteries. Fenestration of the distal left vertebral artery (normal variant). Normal right PICA origin. Patent vertebrobasilar junction. Patent dominant appearing left AICA origin. No basilar stenosis. SCA and left PCA origins are normal. Fetal type right PCA origin. Small left posterior communicating artery. Bilateral PCA branches are within normal limits. Antegrade flow in both ICA siphons. Moderate to severe irregularity in the distal cavernous segment of the right siphon best seen on series 104, image 5. Moderate stenosis there. Mild stenosis of the left siphon at the anterior genu. Normal ophthalmic and posterior communicating artery origins. Patent carotid termini. Dominant left ACA A1 segment. MCA and ACA origins appear within normal limits. Anterior communicating artery and visualized bilateral ACA branches are within normal limits. Left MCA M1 segment, bifurcation, and left MCA branches are within normal limits. Right MCA M1 segment, bifurcation, and visualized right MCA branches are within normal limits. IMPRESSION: *No acute intracranial abnormality. 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 *Negative posterior circulation. Bilateral ICA siphon atherosclerosis, moderate to severe in the distal right cavernous segment with subsequent moderate stenosis. Otherwise negative anterior circulation. Electronically Signed   By: Genevie Ann M.D.   On: 06/12/2015 11:39     ASSESSMENT AND PLAN:   Principal  Problem:   Acute encephalopathy Active Problems:   Type 2 diabetes mellitus (HCC)   HTN (hypertension)   COPD (chronic obstructive pulmonary disease) (HCC)   CKD (chronic kidney disease), stage III   H/O: stroke   #1 acute encephalopathy likely secondary to dehydration. Has acute on chronic renal insufficiency, now he is already alert and oriented. MRI of the brain did not show any acute new stroke. Patient noncompliant with medications. #2 history of previous strokes with left-sided weakness: Has been having some dizziness so check carotid ultrasound, physical therapy evaluation, started  on meclizine. MRI of the brain is negative for acute strokes MRA of the brain also showed no circulation issues. Follow-up echocardiogram results and also carotid ultrasound report. Has severe hyperlipidemia LDL unable to be calculated, patient supposed to be on Lipitor 80 mg daily but not taking it for last 1 week or 2.t advised to be complaint with medications. And he should continue aspirin, Plavix due to his previous strokes. EKG showed normal sinus rhythm with no atrial fibrillations. #3.dmii; 4,Mineralocorticoid deficiency;patient takes fludrocortisone 0.1 mg by mouth daily restart that. 5. History of COPD: No wheezing. #6. history of depression: Patient takes Prozac: Restart that.   All the records are reviewed and case discussed with Care Management/Social Workerr. Management plans discussed with the patient, family and they are in agreement.  CODE STATUS:full  TOTAL TIME TAKING CARE OF THIS PATIENT: 48minutes.   POSSIBLE D/C IN 1-2 DAYS, DEPENDING ON CLINICAL CONDITION.   Epifanio Lesches M.D on 06/12/2015 at 1:18 PM  Between 7am to 6pm - Pager - [phone]  After 6pm go to [email or web address] - password [email or web address]  Office  [phone]  CC: Primary care physician; No primary care provider on file.

## 2015-06-12 NOTE — Evaluation (Signed)
Physical Therapy Evaluation Patient Details Name: Phillip Reyes MRN: [id number] DOB: [birth year] Today's Date: 06/12/2015   History of Present Illness  Patient is a 75 y/o male that presents with increased lethargy and elevated BP. No acute abnormalities noted, though patient has some residual L sided weakness from previous CVA.   Clinical Impression  Patient demonstrates global weakness and deconditioning in this session, with an acute onset of apparent orthostasis. He performs sit to stand transfer reasonably well, then begins to noticeably sway anteriorly-posteriorly, closing his eyes and generally complaining of dizziness/light headed sensation which resolved in roughly 20-30 seconds. Afterwards patient was obviously off balance and required a RW to maintain his balance while ambulating in the hallway. Decreased stride length and stance time on RLE noted during ambulation indicating he is at increased risk of falling, particularly in light of orthostatics. Patient would benefit from STR after discharge to continue to address the above deficits.     Follow Up Recommendations SNF    Equipment Recommendations  Rolling walker with 5" wheels    Recommendations for Other Services       Precautions / Restrictions Precautions Precautions: Fall Precaution Comments: Hx of left foot drop, Pt reports dizziness in standing  Restrictions Weight Bearing Restrictions: Yes Other Position/Activity Restrictions: dizziness in standing      Mobility  Bed Mobility Overal bed mobility: Needs Assistance Bed Mobility: Supine to Sit              Transfers Overall transfer level: Needs assistance   Transfers: Sit to/from Stand Sit to Stand: Min guard            Ambulation/Gait Ambulation/Gait assistance: Min guard Ambulation Distance (Feet): 100 Feet   Gait Pattern/deviations: Decreased stance time - left;Trunk flexed;Narrow base of support   Gait velocity interpretation: <1.8 ft/sec,  indicative of risk for recurrent falls    Stairs            Wheelchair Mobility    Modified Rankin (Stroke Patients Only)       Balance Overall balance assessment: Needs assistance;History of Falls Sitting-balance support: No upper extremity supported Sitting balance-Leahy Scale: Good     Standing balance support: Bilateral upper extremity supported Standing balance-Leahy Scale: Fair Standing balance comment: Initially patient reports symptoms consistent with orthostatic hypotension, these last roughly 20 seconds before he states he is able to ambulate. Dizziness/light headed sensation.                              Pertinent Vitals/Pain Pain Assessment:  (Patient reports he feels like he has "been hit by a [employer]" all over, agrees to attempt mobility)    [place] expects to be discharged to:: Private residence Living Arrangements: Spouse/significant other (Pt is caregiver for wife , and has help with household tasks ) Available Help at Discharge: Available PRN/intermittently Type of Home: [place]: [employer]: Environmental consultant - 2 wheels;Cane - single point;Bedside commode;Tub bench;Crutches;Hand held shower head      Prior Function Level of Independence: Needs assistance (modified independent)   Gait / Transfers Assistance Needed: Patient reports he has not recently been using any ADs.   ADL's / Homemaking Assistance Needed: Pt has assistance withhouse hold tasks        Hand Dominance   Dominant Hand: Right    Extremity/Trunk Assessment   Upper Extremity Assessment: Overall WFL for tasks  assessed           Lower Extremity Assessment: Overall WFL for tasks assessed         Communication   Communication: No difficulties  Cognition Arousal/Alertness: Awake/alert Behavior During Therapy: WFL for tasks assessed/performed;Flat affect Overall Cognitive Status: Within  Functional Limits for tasks assessed                      General Comments      Exercises        Assessment/Plan    PT Assessment Patient needs continued PT services  PT Diagnosis Difficulty walking;Generalized weakness   PT Problem List Decreased strength;Decreased knowledge of use of DME;Decreased safety awareness;Decreased activity tolerance;Decreased mobility;Decreased balance  PT Treatment Interventions DME instruction;Gait training;Stair training;Therapeutic activities;Therapeutic exercise;Balance training   PT Goals (Current goals can be found in the Care Plan section) Acute Rehab PT Goals Patient Stated Goal: To get better  PT Goal Formulation: With patient Time For Goal Achievement: 06/26/15 Potential to Achieve Goals: Good    Frequency Min 2X/week   Barriers to discharge Inaccessible home environment;Decreased caregiver support Patient is primary caregiver for his wife who is bedbound at baseline.     Co-evaluation               End of Session Equipment Utilized During Treatment: Gait belt Activity Tolerance: Patient tolerated treatment well Patient left: in chair;with call bell/phone within reach;with chair alarm set Nurse Communication: Mobility status         Time: [id number] PT Time Calculation (min) (ACUTE ONLY): 15 min   Charges:   PT Evaluation $PT Eval Moderate Complexity: 1 Procedure     PT G Codes:       Kerman Passey, PT, DPT    06/12/2015, 5:49 PM

## 2015-06-12 NOTE — Progress Notes (Signed)
Chaplain rounded in the unit and offered a compassionate presence and support to the patient inclusive of prayer. Phillip Reyes [phone]

## 2015-06-12 NOTE — Evaluation (Signed)
Clinical/Bedside Swallow Evaluation Patient Details  Name: Manmeet Mcquaide MRN: [id number] Date of Birth: [birth year]  Today's Date: 06/12/2015 Time: SLP Start Time (ACUTE ONLY): 1200 SLP Stop Time (ACUTE ONLY): 1240 SLP Time Calculation (min) (ACUTE ONLY): 40 min  Past Medical History:  Past Medical History  Diagnosis Date  . Diabetes mellitus without complication ([place])   . Cancer ([place])   . COPD (chronic obstructive pulmonary disease) ([place])   . Hypertension   . Renal disorder   . Stroke ([place])   . Arthritis    Past Surgical History:  Past Surgical History  Procedure Laterality Date  . Appendectomy     HPI:  Vanson Soldan is a 75 y.o. male who presents with acute encephalopathy. Patient is only able to provide a limited amount of his history due to the fact that he does not remember most of the events of today. He is accompanied by his neighbors who provide most of the history. Patient lives at home with his wife, who requires home health nursing. He home health nurse states that when she arrive to pts home today he was still asleep - this was around 3-4 pm. Pts neighbors state that this is extremely atypical, as pt cares for his bedridden wife and is very diligent. EMS was called and pat was brought to ED. He was still altered here, though with stable vitals except elevated BP. He slowly became more alert, but does not recall any of the events leading to his being brought to ED. Initial workup is largely benign. He does report a significant history of prior strokes. These were typically involving motor function of his left side, which he does not have any issue with today. He does report sensory deficit in his left hand. Hospitalists were called for admission and further evaluation.   Assessment / Plan / Recommendation Clinical Impression  Pt presents w/no apparent oropharyngeal dysphagia. Pt given thin, puree and solid consistencies. No overt s/s of aspiration were observed with  any tested consistency. Laryngeal elevation appeared adequate and vocal quality remained clear throughout. Oral phase appeared [facility]. Pt able to adequately masticate and clear solid consistency. No pocketing or holding was observed. Oral mech exam revealed oral motor abillities to be [facility]. Recommend continue w/regular diet w/thin liquids. Pt also denies any speech/language deficits and was able to converse appropriately with ST.     Aspiration Risk  No limitations    Diet Recommendation Regular;Thin liquid   Liquid Administration via: Straw;Cup Medication Administration: Whole meds with liquid Supervision: Patient able to self feed Compensations: Minimize environmental distractions;Slow rate;Small sips/bites Postural Changes: Seated upright at 90 degrees    Other  Recommendations Oral Care Recommendations: Oral care BID;Patient independent with oral care   Follow up Recommendations  None    Frequency and Duration            Prognosis Prognosis for Safe Diet Advancement: Good      Swallow Study   General Date of Onset: 06/12/15 HPI: Johnnie Zeimet is a 75 y.o. male who presents with acute encephalopathy. Patient is only able to provide a limited amount of his history due to the fact that he does not remember most of the events of today. He is accompanied by his neighbors who provide most of the history. Patient lives at home with his wife, who requires home health nursing. He home health nurse states that when she arrive to pts home today he was still asleep - this was around 3-4 pm. Pts neighbors  state that this is extremely atypical, as pt cares for his bedridden wife and is very diligent. EMS was called and pat was brought to ED. He was still altered here, though with stable vitals except elevated BP. He slowly became more alert, but does not recall any of the events leading to his being brought to ED. Initial workup is largely benign. He does report a significant history of prior  strokes. These were typically involving motor function of his left side, which he does not have any issue with today. He does report sensory deficit in his left hand. Hospitalists were called for admission and further evaluation. Type of Study: Bedside Swallow Evaluation Previous Swallow Assessment: none reported Diet Prior to this Study: Regular;Thin liquids Temperature Spikes Noted: No Respiratory Status: Room air History of Recent Intubation: No Behavior/Cognition: Cooperative;Alert;Pleasant mood Oral Cavity Assessment: Within Functional Limits Oral Care Completed by SLP: No Oral Cavity - Dentition: Dentures, top;Dentures, bottom Vision: Functional for self-feeding Self-Feeding Abilities: Able to feed self Patient Positioning: Upright in bed Baseline Vocal Quality: Normal Volitional Cough: Strong Volitional Swallow: Able to elicit    Oral/Motor/Sensory Function Overall Oral Motor/Sensory Function: Within functional limits   Ice Chips Ice chips: Not tested   Thin Liquid Thin Liquid: Within functional limits Presentation: Cup;Straw;Self Fed    Nectar Thick Nectar Thick Liquid: Not tested   Honey Thick Honey Thick Liquid: Not tested   Puree Puree: Within functional limits Presentation: Self Fed;Spoon   Solid   GO   Solid: Within functional limits Presentation: Self Fed        [place],Maaran 06/12/2015,12:40 PM

## 2015-06-12 NOTE — Patient Instructions (Signed)
Pt instructed in fall precautions - not to get up without assistance  , and use of call bell for assistance. Pt able to verbalize and demonstrate understanding of precautions

## 2015-06-12 NOTE — Progress Notes (Signed)
*  PRELIMINARY RESULTS* Echocardiogram 2D Echocardiogram has been performed.  Phillip Reyes 06/12/2015, 10:10 AM

## 2015-06-12 NOTE — Progress Notes (Signed)
PT Cancellation Note  Patient Details Name: Phillip Reyes MRN: [id number] DOB: 1941-02-10   Cancelled Treatment:    Reason Eval/Treat Not Completed: Patient at procedure or test/unavailable. Patient is currently off the floor for MRI and unavailable for mobility evaluation. PT will re-attempt as patient is available and appropriate.   Kerman Passey, PT, DPT    06/12/2015, 11:15 AM

## 2015-06-12 NOTE — Evaluation (Signed)
Occupational Therapy Evaluation Patient Details Name: Javiar Tuy MRN: [id number] DOB: 1941/01/25 Today's Date: 06/12/2015    History of Present Illness   Vadin Shelly is a 75 y.o. male who presents with acute encephalopathy   Clinical Impression   Pt had no c/o of dizziness or decrease balance in sitting . Pt modified independent in sitting with ADLs.  Pt has very limited tolerance for standing position  - c/o dizziness. Pt unable to perform activities that  require standing /functional mobility.    Follow Up Recommendations  Supervision/Assistance - 24 hour;SNF    Equipment Recommendations       Recommendations for Other Services PT consult     Precautions / Restrictions Precautions Precautions: Fall Precaution Comments: Hx of left foot drop, Pt reports dizziness in standing  Restrictions Weight Bearing Restrictions: Yes Other Position/Activity Restrictions: dizziness in standing      Mobility Bed Mobility Overal bed mobility: Modified Independent                Transfers Overall transfer level: Needs assistance (Unable to maintain standing position - dizziness in standing, In sitting or supine no c/o dizziness)                    Balance Overall balance assessment: Needs assistance;History of Falls                                          ADL Overall ADL's : Modified independent (in sitting , dizziness in standing /decrease balance)                                             Vision  Pt wears glasses and has a hx of poor/decrease vision   Perception Perception Perception Tested?: No   Praxis      Pertinent Vitals/Pain Pain Assessment: Faces (Pt reports HX of constant back pain Pt back pain is not new ) Faces Pain Scale: Hurts little more Pain Descriptors / Indicators: Constant     Hand Dominance Right   Extremity/Trunk Assessment Upper Extremity Assessment Upper Extremity Assessment: Overall  WFL for tasks assessed (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)   Lower Extremity Assessment Lower Extremity Assessment: Defer to PT evaluation       Communication Communication Communication: No difficulties   Cognition Arousal/Alertness: Awake/alert Behavior During Therapy: WFL for tasks assessed/performed Overall Cognitive Status: Within Functional Limits for tasks assessed                     General Comments    Pt would benefit from skilled OT to increase functional mobility /ADLs status    Exercises       Shoulder Instructions      Home Living Family/patient expects to be discharged to:: Private residence Living Arrangements: Spouse/significant other (Pt is caregiver for wife , and has help with household tasks ) Available Help at Discharge: Available PRN/intermittently Type of Home: House Home Access: Ramped entrance     Home Layout: One level     Bathroom Shower/Tub: Teacher, early years/pre: Standard     Home Equipment: Environmental consultant - 2 wheels;Cane - single point;Bedside commode;Tub bench;Crutches;Hand held shower head          Prior  Functioning/Environment Level of Independence: Needs assistance (modified independent)    ADL's / Homemaking Assistance Needed: Pt has assistance withhouse hold tasks        OT Diagnosis: Generalized weakness   OT Problem List: Decreased activity tolerance;Impaired balance (sitting and/or standing);Other (comment);Decreased safety awareness (decrease functional mobility, decrease ADL status )   OT Treatment/Interventions: Self-care/ADL training;Therapeutic exercise;Neuromuscular education;Energy conservation;DME and/or AE instruction;Patient/family education;Therapeutic activities;Balance training    OT Goals(Current goals can be found in the care plan section) Acute Rehab OT Goals Patient Stated Goal: To get better  OT Goal Formulation: With patient  OT Frequency: Min 1X/week   Barriers to D/C:             Co-evaluation              End of Session Equipment Utilized During Treatment: Gait belt Nurse Communication: Precautions;Other (comment) (dizziness when up in standing)  Activity Tolerance: Patient tolerated treatment well (in sitting, unable to tolerate activities in standing) Patient left: in bed;with call bell/phone within reach;with bed alarm set   Time: [id number] OT Time Calculation (min): 28 min Charges:  OT Evaluation $OT Eval Low Complexity: 1 Procedure G-Codes: OT G-codes **NOT FOR INPATIENT CLASS** Functional Limitation: Self care Self Care Current Status [id number]): At least 20 percent but less than 40 percent impaired, limited or restricted Self Care Goal Status [id number]): At least 20 percent but less than 40 percent impaired, limited or restricted  Zorianna Taliaferro W 06/12/2015, 11:39 AM

## 2015-06-13 LAB — GLUCOSE, CAPILLARY
GLUCOSE-CAPILLARY: 154 mg/dL — AB (ref 65–99)
GLUCOSE-CAPILLARY: 298 mg/dL — AB (ref 65–99)

## 2015-06-13 NOTE — Care Management Note (Signed)
Case Management Note  Patient Details  Name: Pearley Thresher MRN: [id number] Date of Birth: 1941/03/29  Subjective/Objective:     A referral for home health PT was faxed and called to [facility].                Action/Plan:   Expected Discharge Date:                  Expected Discharge Plan:     In-House Referral:     Discharge planning Services     Post Acute Care Choice:    Choice offered to:     DME Arranged:    DME Agency:     HH Arranged:    [place] Agency:     Status of Service:     Medicare Important Message Given:    Date Medicare IM Given:    Medicare IM give by:    Date Additional Medicare IM Given:    Additional Medicare Important Message give by:     If discussed at [place] of Stay Meetings, dates discussed:    Additional Comments:  Avaya Mcjunkins A, RN 06/13/2015, 12:20 PM

## 2015-06-13 NOTE — Progress Notes (Signed)
  Clinical Education officer, museum (CSW) received SNF consult. Patient is refusing SNF, wants to discharge home. RN Case Manager aware of above and will assist with any discharge needs. CSW signing off.  Casimer Lanius. [place] Work Department [phone] 1:06 PM

## 2015-06-13 NOTE — Progress Notes (Signed)
[facility] NAME: Phillip Reyes    MR#:  [id number]  DATE OF BIRTH:  11-13-1940  SUBJECTIVE: Stroke workup is negative. He says he feels better. And wants to go home. Physical therapy recommended SNF placement but today he walked aroundthe station with minimal assistance. Patient says that he has 5 walkers worsened 9 cane's at home. And he does not use any of them at home. He walks without assistance at home. And he wants to go home to take care of his bedridden wife.   CHIEF COMPLAINT:   Chief Complaint  Patient presents with  . Weakness    REVIEW OF SYSTEMS:    Review of Systems  Constitutional: Negative for fever and chills.  HENT: Negative for hearing loss.   Eyes: Negative for blurred vision, double vision and photophobia.  Respiratory: Negative for cough, hemoptysis and shortness of breath.   Cardiovascular: Negative for palpitations, orthopnea and leg swelling.  Gastrointestinal: Negative for vomiting, abdominal pain and diarrhea.  Genitourinary: Negative for dysuria and urgency.  Musculoskeletal: Negative for myalgias and neck pain.  Skin: Negative for rash.  Neurological: Negative for dizziness, focal weakness, seizures, weakness and headaches.  Psychiatric/Behavioral: Negative for memory loss. The patient does not have insomnia.     Nutrition:  Tolerating Diet: Tolerating PT:      DRUG ALLERGIES:  No Known Allergies  VITALS:  Blood pressure 156/76, pulse 53, temperature 98 F (36.7 C), temperature source Oral, resp. rate 18, height 5\' 7"  (1.702 m), weight 90.084 kg (198 lb 9.6 oz), SpO2 96 %.  PHYSICAL EXAMINATION:   Physical Exam  GENERAL:  75 y.o.-year-old patient lying in the bed with no acute distress.  EYES: Pupils equal, round, reactive to light and accommodation. No scleral icterus. Extraocular muscles intact.  HEENT: Head atraumatic, normocephalic. Oropharynx and nasopharynx clear.  NECK:   Supple, no jugular venous distention. No thyroid enlargement, no tenderness.  LUNGS: Normal breath sounds bilaterally, no wheezing, rales,rhonchi or crepitation. No use of accessory muscles of respiration.  CARDIOVASCULAR: S1, S2 normal. No murmurs, rubs, or gallops.  ABDOMEN: Soft, nontender, nondistended. Bowel sounds present. No organomegaly or mass.  EXTREMITIES: No pedal edema, cyanosis, or clubbing.  NEUROLOGIC: Cranial nerves II through XII are intact. Muscle strength 5/5 in all extremities. Sensation intact. Gait not checked.  PSYCHIATRIC: The patient is alert and oriented x 3.  SKIN: No obvious rash, lesion, or ulcer.    LABORATORY PANEL:   CBC  Recent Labs Lab 06/11/15 1753  WBC 5.2  HGB 13.3  HCT 38.1*  PLT 165   ------------------------------------------------------------------------------------------------------------------  Chemistries   Recent Labs Lab 06/11/15 1753  NA 137  K 3.8  CL 104  CO2 25  GLUCOSE 297*  BUN 24*  CREATININE 1.50*  CALCIUM 8.6*  AST 17  ALT 17  ALKPHOS 67  BILITOT 0.5   ------------------------------------------------------------------------------------------------------------------  Cardiac Enzymes  Recent Labs Lab 06/11/15 1753  TROPONINI <0.03   ------------------------------------------------------------------------------------------------------------------  RADIOLOGY:  Ct Head Wo Contrast  06/11/2015  CLINICAL DATA:  Altered mental status. Nonproductive cough. Weakness and lethargy. EXAM: CT HEAD WITHOUT CONTRAST TECHNIQUE: Contiguous axial images were obtained from the base of the skull through the vertex without intravenous contrast. COMPARISON:  02/16/2013 FINDINGS: Mild generalized atrophy and chronic small vessel ischemia.No intracranial hemorrhage, mass effect, or midline shift. No hydrocephalus. The basilar cisterns are patent. No evidence of territorial infarct. No intracranial fluid collection. Calvarium is  intact. Included paranasal sinuses  and mastoid air cells are well aerated. IMPRESSION: No acute intracranial abnormality. Stable atrophy and chronic small vessel ischemia. Electronically Signed   By: Jeb Levering M.D.   On: 06/11/2015 19:07   Ct Angio Chest Pe W/cm &/or Wo Cm  06/11/2015  CLINICAL DATA:  Weakness and lethargy. EXAM: CT ANGIOGRAPHY CHEST WITH CONTRAST TECHNIQUE: Multidetector CT imaging of the chest was performed using the standard protocol during bolus administration of intravenous contrast. Multiplanar CT image reconstructions and MIPs were obtained to evaluate the vascular anatomy. CONTRAST:  44mL OMNIPAQUE IOHEXOL 350 MG/ML SOLN COMPARISON:  None FINDINGS: Mediastinum: The heart size is normal. There is aortic atherosclerosis as well as LAD coronary artery calcifications. The trachea is patent and midline. Normal appearance of the esophagus. No axillary or supraclavicular adenopathy. The main pulmonary artery is patent. No saddle embolus identified. There is no lobar or segmental pulmonary artery filling defects identified. Lungs/Pleura: No pleural fluid identified. No airspace consolidation or atelectasis identified. Mild changes of paraseptal emphysema. Diffuse bronchial wall thickening noted. Upper Abdomen: There is diffuse hepatic steatosis noted. No focal liver abnormality. The adrenal glands are unremarkable. The visualized portions of the spleen normal. Musculoskeletal: Mild multi level degenerative disc disease is identified within the thoracic spine. No aggressive lytic or sclerotic bone lesions identified. Review of the MIP images confirms the above findings. IMPRESSION: 1. No acute cardiopulmonary abnormalities. No evidence for acute pulmonary embolus. 2. Aortic atherosclerosis and LAD coronary artery calcification 3. Diffuse bronchial wall thickening with emphysema, as above; imaging findings suggestive of underlying COPD. Electronically Signed   By: Kerby Moors M.D.   On:  06/11/2015 19:09   Mr Brain Wo Contrast  06/12/2015  CLINICAL DATA:  75 year old male, found with decreased mental status at home. Acute encephalopathy. Initial encounter. EXAM: MRI HEAD WITHOUT CONTRAST MRA HEAD WITHOUT CONTRAST TECHNIQUE: Multiplanar, multiecho pulse sequences of the brain and surrounding structures were obtained without intravenous contrast. Angiographic images of the head were obtained using MRA technique without contrast. COMPARISON:  Head CT without contrast 06/11/2015. Brain MRI 06/03/2008 FINDINGS: MRI HEAD FINDINGS Major intracranial vascular flow voids are stable. Cerebral volume has not significantly changed since 2010. No restricted diffusion to suggest acute infarction. No midline shift, mass effect, evidence of mass lesion, ventriculomegaly, extra-axial collection or acute intracranial hemorrhage. Cervicomedullary junction and pituitary are within normal limits. Negative visualized cervical spine. Chronic lacunar infarct in the right thalamus is unchanged since 2010. Suggestion of chronic micro hemorrhage in the right parietal lobe (series 14, image 16). Otherwise gray and white matter signal is stable and within normal limits for age throughout the brain. Visible internal auditory structures appear normal. Mastoids are clear. Trace paranasal sinus mucosal thickening, improved since 2010. Negative orbit and scalp soft tissues. Visualized bone marrow signal is within normal limits. MRA HEAD FINDINGS Antegrade flow in the posterior circulation with codominant distal vertebral arteries. Fenestration of the distal left vertebral artery (normal variant). Normal right PICA origin. Patent vertebrobasilar junction. Patent dominant appearing left AICA origin. No basilar stenosis. SCA and left PCA origins are normal. Fetal type right PCA origin. Small left posterior communicating artery. Bilateral PCA branches are within normal limits. Antegrade flow in both ICA siphons. Moderate to severe  irregularity in the distal cavernous segment of the right siphon best seen on series 104, image 5. Moderate stenosis there. Mild stenosis of the left siphon at the anterior genu. Normal ophthalmic and posterior communicating artery origins. Patent carotid termini. Dominant left ACA A1 segment. MCA and  ACA origins appear within normal limits. Anterior communicating artery and visualized bilateral ACA branches are within normal limits. Left MCA M1 segment, bifurcation, and left MCA branches are within normal limits. Right MCA M1 segment, bifurcation, and visualized right MCA branches are within normal limits. IMPRESSION: *No acute intracranial abnormality. 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 *Negative posterior circulation. Bilateral ICA siphon atherosclerosis, moderate to severe in the distal right cavernous segment with subsequent moderate stenosis. Otherwise negative anterior circulation. Electronically Signed   By: Genevie Ann M.D.   On: 06/12/2015 11:39   US Carotid Bilateral  06/12/2015  CLINICAL DATA:  Dizziness. EXAM: BILATERAL CAROTID DUPLEX ULTRASOUND TECHNIQUE: Pearline Cables scale imaging, color Doppler and duplex ultrasound were performed of bilateral carotid and vertebral arteries in the neck. COMPARISON:  None. FINDINGS: Criteria: Quantification of carotid stenosis is based on velocity parameters that correlate the residual internal carotid diameter with NASCET-based stenosis levels, using the diameter of the distal internal carotid lumen as the denominator for stenosis measurement. The following velocity measurements were obtained: RIGHT ICA:  95/15 cm/sec CCA:  0000000 cm/sec SYSTOLIC ICA/CCA RATIO:  0.9 DIASTOLIC ICA/CCA RATIO:  1.5 ECA:  117 cm/sec LEFT ICA:  105/26 cm/sec CCA:  [id number] cm/sec SYSTOLIC ICA/CCA RATIO:  1.2 DIASTOLIC ICA/CCA RATIO:  2.2 ECA:  304 cm/sec RIGHT CAROTID ARTERY: Minimal plaque formation is noted in the right carotid bulb and proximal right internal carotid  artery. Findings are consistent with less than 50% diameter stenosis based on ultrasound and Doppler criteria. RIGHT VERTEBRAL ARTERY:  Antegrade flow is noted. LEFT CAROTID ARTERY: Mild calcified plaque formation is noted in the proximal left internal carotid artery consistent with less than 50% diameter stenosis based on ultrasound and Doppler criteria. LEFT VERTEBRAL ARTERY:  Antegrade flow is noted. IMPRESSION: Minimal plaque formation is noted in the right carotid bulb and proximal right internal carotid artery consistent with less than 50% diameter stenosis based on ultrasound and Doppler criteria. Mild calcified plaque formation is noted in the proximal left internal carotid artery consistent with less than 50% diameter stenosis based on ultrasound and Doppler criteria. Electronically Signed   By: Marijo Conception, M.D.   On: 06/12/2015 16:36   Mr Mra Head/brain Wo Cm  06/12/2015  CLINICAL DATA:  75 year old male, found with decreased mental status at home. Acute encephalopathy. Initial encounter. EXAM: MRI HEAD WITHOUT CONTRAST MRA HEAD WITHOUT CONTRAST TECHNIQUE: Multiplanar, multiecho pulse sequences of the brain and surrounding structures were obtained without intravenous contrast. Angiographic images of the head were obtained using MRA technique without contrast. COMPARISON:  Head CT without contrast 06/11/2015. Brain MRI 06/03/2008 FINDINGS: MRI HEAD FINDINGS Major intracranial vascular flow voids are stable. Cerebral volume has not significantly changed since 2010. No restricted diffusion to suggest acute infarction. No midline shift, mass effect, evidence of mass lesion, ventriculomegaly, extra-axial collection or acute intracranial hemorrhage. Cervicomedullary junction and pituitary are within normal limits. Negative visualized cervical spine. Chronic lacunar infarct in the right thalamus is unchanged since 2010. Suggestion of chronic micro hemorrhage in the right parietal lobe (series 14, image  16). Otherwise gray and white matter signal is stable and within normal limits for age throughout the brain. Visible internal auditory structures appear normal. Mastoids are clear. Trace paranasal sinus mucosal thickening, improved since 2010. Negative orbit and scalp soft tissues. Visualized bone marrow signal is within normal limits. MRA HEAD FINDINGS Antegrade flow in the posterior circulation with codominant distal vertebral arteries. Fenestration of the distal left vertebral artery (  normal variant). Normal right PICA origin. Patent vertebrobasilar junction. Patent dominant appearing left AICA origin. No basilar stenosis. SCA and left PCA origins are normal. Fetal type right PCA origin. Small left posterior communicating artery. Bilateral PCA branches are within normal limits. Antegrade flow in both ICA siphons. Moderate to severe irregularity in the distal cavernous segment of the right siphon best seen on series 104, image 5. Moderate stenosis there. Mild stenosis of the left siphon at the anterior genu. Normal ophthalmic and posterior communicating artery origins. Patent carotid termini. Dominant left ACA A1 segment. MCA and ACA origins appear within normal limits. Anterior communicating artery and visualized bilateral ACA branches are within normal limits. Left MCA M1 segment, bifurcation, and left MCA branches are within normal limits. Right MCA M1 segment, bifurcation, and visualized right MCA branches are within normal limits. IMPRESSION: *No acute intracranial abnormality. 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 *Negative posterior circulation. Bilateral ICA siphon atherosclerosis, moderate to severe in the distal right cavernous segment with subsequent moderate stenosis. Otherwise negative anterior circulation. Electronically Signed   By: Genevie Ann M.D.   On: 06/12/2015 11:39     ASSESSMENT AND PLAN:   Principal Problem:   Acute encephalopathy Active Problems:   Type 2  diabetes mellitus (HCC)   HTN (hypertension)   COPD (chronic obstructive pulmonary disease) (HCC)   CKD (chronic kidney disease), stage III   H/O: stroke   #1 acute encephalopathy likely secondary to dehydration. Has acute on chronic renal insufficiency, now he is already alert and oriented. MRI of the brain did not show any acute new stroke. Patient noncompliant with medications. #2 history of previous strokes with left-sided weakness: Ultrasound did not show any hemodynamically significant stenosis., physical therapy evaluation, recommended SNF placement but that today patient feels stronger and able to walk to the nurses station without any help. I requested physical therapy to come by and see the patient , probably can discharge home with home physical therapy. started on meclizine. MRI of the brain is negative for acute strokes MRA of the brain also showed no circulation issues. Cor study of more than 55%.  report. Has severe hyperlipidemia LDL unable to be calculated, patient supposed to be on Lipitor 80 mg daily but not taking it for last 1 week or 2.t advised to be complaint with medications. And he should continue aspirin, Plavix due to his previous strokes. EKG showed normal sinus rhythm with no atrial fibrillations. #3.dmii; 4,Mineralocorticoid deficiency;patient takes fludrocortisone 0.1 mg by mouth daily restart that. 5. History of COPD: No wheezing. #6. history of depression: Patient takes Prozac: Restart that.  Likely discharged today with home physical therapy pending reevaluation by physical therapy here Discussed the same with the patient All the records are reviewed and case discussed with Care Management/Social Workerr. Management plans discussed with the patient, family and they are in agreement.  CODE STATUS:full  TOTAL TIME TAKING CARE OF THIS PATIENT: 81minutes.   POSSIBLE D/C IN 1-2 DAYS, DEPENDING ON CLINICAL CONDITION.   Epifanio Lesches M.D on 06/13/2015 at  11:04 AM  Between 7am to 6pm - Pager - [phone]  After 6pm go to [email or web address] - password [email or web address] [facility]  [facility]  Office  [phone]  CC: Primary care physician; No primary care provider on file.

## 2015-06-13 NOTE — Discharge Summary (Signed)
Phillip Reyes, is a 75 y.o. male  DOB 25-Jan-1941  MRN [id number].  Admission date:  06/11/2015  Admitting Physician  Lance Coon, MD  Discharge Date:  06/13/2015   Primary MD  No primary care provider on file.  Recommendations for primary care physician for things to follow:  Patient to follow up  With  Dr. Laurell Josephs his primary doctor,   Admission Diagnosis  Altered mental status, unspecified altered mental status type [R41.82]   Discharge Diagnosis  Altered mental status, unspecified altered mental status type [R41.82]    Principal Problem:   Acute encephalopathy Active Problems:   Type 2 diabetes mellitus (HCC)   HTN (hypertension)   COPD (chronic obstructive pulmonary disease) ([place])   CKD (chronic kidney disease), stage III   H/O: stroke      Past Medical History  Diagnosis Date  . Diabetes mellitus without complication ([place])   . Cancer ([place])   . COPD (chronic obstructive pulmonary disease) ([place])   . Hypertension   . Renal disorder   . Stroke ([place])   . Arthritis     Past Surgical History  Procedure Laterality Date  . Appendectomy         History of present illness and  Hospital Course:     Kindly see H&P for history of present illness and admission details, please review complete Labs, Consult reports and Test reports for all details in brief  HPI  from the history and physical done on the day of admission 75 year old male patient with history of  previous strokes, diabetes mellitus type 2, hypertension admitted because of Weakness, altered mental status. Patient was brought in by neighbors. Usually he is very alert and diligent takes care of his bedridden wife. Because of change in mental status he was admitted for evaluation of possible stroke.  Hospital Course  On altered mental status  secondary to dehydration rather than stroke. History of workup including CT of the head, MRI of the brain, MRA of the brain did not show any stroke or intracranial stenosis. Carotid ultrasound did not show any hemodynamically significant stenosis. Echo showed EF more than 55%. Patient has no weakness on the right side or left side. He feels weak on left little bit because of previous strokes. Patient says that he stopped taking aspirin and Plavix weeks before he came. Advised him to be complaint with his medications, we'll resume the aspirin, Plavix, high intensity statins. Patient remained in normal sinus rhythm without any atrial fibrillation the monitor. She'll need physical therapy recommended SNF placement but today patient ambulated around the hall and the he had a steady gait he did not have any dizziness she did not require any  walker also. We will discharge him home with home health physical therapy to make sure he is stable at home. #2 history of mineralocorticoid deficiency: Patient takes fludrocortisone 0.1 mg daily. #3 history of COPD: No wheezing. Continue home medications. #4 history of depression: No symptoms of depression today. Continue Prozac.  Severe hyperlipidemia patient LDL was unable to be calculated, patient said that he was not taking lipitor.advised him to be compliant with meds. #6 diabetes mellitus type 2: Patient takes the NPH insulin, sliding scale scale coverage also.    Discharge Condition: stable   Follow UP In the primary doctor is Dr. Laurell Josephs but did not see him recently advised him to follow up with him in the next 1 month regarding his diabetes, hypertension, previous strokes, hyperlipidemia, he needs repeat  lipid panel checkup in 3 months.     Discharge Instructions  and  Discharge Medications     Discharge Instructions    Face-to-face encounter (required for Medicare/Medicaid patients)    Complete by:  As directed   I Elaynah Virginia certify  that this patient is under my care and that I, or a nurse practitioner or physician's assistant working with me, had a face-to-face encounter that meets the physician face-to-face encounter requirements with this patient on 06/13/2015. The encounter with the patient was in whole, or in part for the following medical condition(s) which is the primary reason for home health care 1.previous strokes htn hlp Deconditioning dizziness  The encounter with the patient was in whole, or in part, for the following medical condition, which is the primary reason for home health care:  whole  I certify that, based on my findings, the following services are medically necessary home health services:  Physical therapy  Reason for Medically Necessary Home Health Services:  Therapy- Personnel officer, Public librarian  My clinical findings support the need for the above services:  Unsafe ambulation due to balance issues  Further, I certify that my clinical findings support that this patient is homebound due to:  Unsafe ambulation due to balance issues     Home Health    Complete by:  As directed   To provide the following care/treatments:  PT            Medication List    STOP taking these medications        cyclobenzaprine 10 MG tablet  Commonly known as:  FLEXERIL     dipyridamole-aspirin 200-25 MG 12hr capsule  Commonly known as:  AGGRENOX      TAKE these medications        albuterol (2.5 MG/3ML) 0.083% nebulizer solution  Commonly known as:  PROVENTIL  Take 2.5 mg by nebulization every 6 (six) hours as needed for wheezing or shortness of breath.     amitriptyline 25 MG tablet  Commonly known as:  ELAVIL  Take 25 mg by mouth at bedtime.     atorvastatin 80 MG tablet  Commonly known as:  LIPITOR  Take 80 mg by mouth at bedtime.     benzonatate 100 MG capsule  Commonly known as:  TESSALON  Take by mouth 3 (three) times daily as needed for cough.     cholecalciferol 1000  units tablet  Commonly known as:  VITAMIN D  Take 2,000 Units by mouth daily.     clopidogrel 75 MG tablet  Commonly known as:  PLAVIX  Take 75 mg by mouth daily.     COMBIVENT RESPIMAT 20-100 MCG/ACT Aers respimat  Generic drug:  Ipratropium-Albuterol  Inhale 1 puff into the lungs every 6 (six) hours as needed for wheezing or shortness of breath.     doxazosin 8 MG tablet  Commonly known as:  CARDURA  Take 8 mg by mouth at bedtime.     fludrocortisone 0.1 MG tablet  Commonly known as:  FLORINEF  Take 0.1 mg by mouth daily.     FLUoxetine 20 MG capsule  Commonly known as:  PROZAC  Take 40 mg by mouth daily.     gabapentin 400 MG capsule  Commonly known as:  NEURONTIN  Take 400 mg by mouth 3 (three) times daily.     hydrocerin Crea  Apply 1 application topically 2 (two) times daily.     hydroxypropyl methylcellulose / hypromellose 2.5 %  ophthalmic solution  Commonly known as:  ISOPTO TEARS / GONIOVISC  Place 1 drop into both eyes as needed for dry eyes.     insulin NPH Human 100 UNIT/ML injection  Commonly known as:  HUMULIN N,NOVOLIN N  Inject 40 Units into the skin at bedtime.     insulin regular 100 units/mL injection  Commonly known as:  NOVOLIN R,HUMULIN R  Inject 16 Units into the skin 3 (three) times daily with meals.     ipratropium 0.02 % nebulizer solution  Commonly known as:  ATROVENT  Take 0.5 mg by nebulization every 6 (six) hours as needed for wheezing or shortness of breath.     loratadine 10 MG tablet  Commonly known as:  CLARITIN  Take 10 mg by mouth daily.     ondansetron 8 MG tablet  Commonly known as:  ZOFRAN  Take 4 mg by mouth every 8 (eight) hours as needed for nausea or vomiting.     vitamin B-12 1000 MCG tablet  Commonly known as:  CYANOCOBALAMIN  Take 1,000 mcg by mouth daily.          Diet and Activity recommendation: See Discharge Instructions above   Consults obtained - Physical Therapy, occupational therapy   Major  procedures and Radiology Reports - PLEASE review detailed and final reports for all details, in brief -      Ct Head Wo Contrast  06/11/2015  CLINICAL DATA:  Altered mental status. Nonproductive cough. Weakness and lethargy. EXAM: CT HEAD WITHOUT CONTRAST TECHNIQUE: Contiguous axial images were obtained from the base of the skull through the vertex without intravenous contrast. COMPARISON:  02/16/2013 FINDINGS: Mild generalized atrophy and chronic small vessel ischemia.No intracranial hemorrhage, mass effect, or midline shift. No hydrocephalus. The basilar cisterns are patent. No evidence of territorial infarct. No intracranial fluid collection. Calvarium is intact. Included paranasal sinuses and mastoid air cells are well aerated. IMPRESSION: No acute intracranial abnormality. Stable atrophy and chronic small vessel ischemia. Electronically Signed   By: Jeb Levering M.D.   On: 06/11/2015 19:07   Ct Angio Chest Pe W/cm &/or Wo Cm  06/11/2015  CLINICAL DATA:  Weakness and lethargy. EXAM: CT ANGIOGRAPHY CHEST WITH CONTRAST TECHNIQUE: Multidetector CT imaging of the chest was performed using the standard protocol during bolus administration of intravenous contrast. Multiplanar CT image reconstructions and MIPs were obtained to evaluate the vascular anatomy. CONTRAST:  19mL OMNIPAQUE IOHEXOL 350 MG/ML SOLN COMPARISON:  None FINDINGS: Mediastinum: The heart size is normal. There is aortic atherosclerosis as well as LAD coronary artery calcifications. The trachea is patent and midline. Normal appearance of the esophagus. No axillary or supraclavicular adenopathy. The main pulmonary artery is patent. No saddle embolus identified. There is no lobar or segmental pulmonary artery filling defects identified. Lungs/Pleura: No pleural fluid identified. No airspace consolidation or atelectasis identified. Mild changes of paraseptal emphysema. Diffuse bronchial wall thickening noted. Upper Abdomen: There is diffuse  hepatic steatosis noted. No focal liver abnormality. The adrenal glands are unremarkable. The visualized portions of the spleen normal. Musculoskeletal: Mild multi level degenerative disc disease is identified within the thoracic spine. No aggressive lytic or sclerotic bone lesions identified. Review of the MIP images confirms the above findings. IMPRESSION: 1. No acute cardiopulmonary abnormalities. No evidence for acute pulmonary embolus. 2. Aortic atherosclerosis and LAD coronary artery calcification 3. Diffuse bronchial wall thickening with emphysema, as above; imaging findings suggestive of underlying COPD. Electronically Signed   By: Kerby Moors M.D.   On: 06/11/2015  19:09   Mr Herby Abraham Contrast  06/12/2015  CLINICAL DATA:  75 year old male, found with decreased mental status at home. Acute encephalopathy. Initial encounter. EXAM: MRI HEAD WITHOUT CONTRAST MRA HEAD WITHOUT CONTRAST TECHNIQUE: Multiplanar, multiecho pulse sequences of the brain and surrounding structures were obtained without intravenous contrast. Angiographic images of the head were obtained using MRA technique without contrast. COMPARISON:  Head CT without contrast 06/11/2015. Brain MRI 06/03/2008 FINDINGS: MRI HEAD FINDINGS Major intracranial vascular flow voids are stable. Cerebral volume has not significantly changed since 2010. No restricted diffusion to suggest acute infarction. No midline shift, mass effect, evidence of mass lesion, ventriculomegaly, extra-axial collection or acute intracranial hemorrhage. Cervicomedullary junction and pituitary are within normal limits. Negative visualized cervical spine. Chronic lacunar infarct in the right thalamus is unchanged since 2010. Suggestion of chronic micro hemorrhage in the right parietal lobe (series 14, image 16). Otherwise gray and white matter signal is stable and within normal limits for age throughout the brain. Visible internal auditory structures appear normal. Mastoids are  clear. Trace paranasal sinus mucosal thickening, improved since 2010. Negative orbit and scalp soft tissues. Visualized bone marrow signal is within normal limits. MRA HEAD FINDINGS Antegrade flow in the posterior circulation with codominant distal vertebral arteries. Fenestration of the distal left vertebral artery (normal variant). Normal right PICA origin. Patent vertebrobasilar junction. Patent dominant appearing left AICA origin. No basilar stenosis. SCA and left PCA origins are normal. Fetal type right PCA origin. Small left posterior communicating artery. Bilateral PCA branches are within normal limits. Antegrade flow in both ICA siphons. Moderate to severe irregularity in the distal cavernous segment of the right siphon best seen on series 104, image 5. Moderate stenosis there. Mild stenosis of the left siphon at the anterior genu. Normal ophthalmic and posterior communicating artery origins. Patent carotid termini. Dominant left ACA A1 segment. MCA and ACA origins appear within normal limits. Anterior communicating artery and visualized bilateral ACA branches are within normal limits. Left MCA M1 segment, bifurcation, and left MCA branches are within normal limits. Right MCA M1 segment, bifurcation, and visualized right MCA branches are within normal limits. IMPRESSION: *No acute intracranial abnormality. 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 *Negative posterior circulation. Bilateral ICA siphon atherosclerosis, moderate to severe in the distal right cavernous segment with subsequent moderate stenosis. Otherwise negative anterior circulation. Electronically Signed   By: Genevie Ann M.D.   On: 06/12/2015 11:39   US Carotid Bilateral  06/12/2015  CLINICAL DATA:  Dizziness. EXAM: BILATERAL CAROTID DUPLEX ULTRASOUND TECHNIQUE: Pearline Cables scale imaging, color Doppler and duplex ultrasound were performed of bilateral carotid and vertebral arteries in the neck. COMPARISON:  None. FINDINGS:  Criteria: Quantification of carotid stenosis is based on velocity parameters that correlate the residual internal carotid diameter with NASCET-based stenosis levels, using the diameter of the distal internal carotid lumen as the denominator for stenosis measurement. The following velocity measurements were obtained: RIGHT ICA:  95/15 cm/sec CCA:  0000000 cm/sec SYSTOLIC ICA/CCA RATIO:  0.9 DIASTOLIC ICA/CCA RATIO:  1.5 ECA:  117 cm/sec LEFT ICA:  105/26 cm/sec CCA:  [id number] cm/sec SYSTOLIC ICA/CCA RATIO:  1.2 DIASTOLIC ICA/CCA RATIO:  2.2 ECA:  304 cm/sec RIGHT CAROTID ARTERY: Minimal plaque formation is noted in the right carotid bulb and proximal right internal carotid artery. Findings are consistent with less than 50% diameter stenosis based on ultrasound and Doppler criteria. RIGHT VERTEBRAL ARTERY:  Antegrade flow is noted. LEFT CAROTID ARTERY: Mild calcified plaque formation is noted in  the proximal left internal carotid artery consistent with less than 50% diameter stenosis based on ultrasound and Doppler criteria. LEFT VERTEBRAL ARTERY:  Antegrade flow is noted. IMPRESSION: Minimal plaque formation is noted in the right carotid bulb and proximal right internal carotid artery consistent with less than 50% diameter stenosis based on ultrasound and Doppler criteria. Mild calcified plaque formation is noted in the proximal left internal carotid artery consistent with less than 50% diameter stenosis based on ultrasound and Doppler criteria. Electronically Signed   By: Marijo Conception, M.D.   On: 06/12/2015 16:36   Mr Mra Head/brain Wo Cm  06/12/2015  CLINICAL DATA:  75 year old male, found with decreased mental status at home. Acute encephalopathy. Initial encounter. EXAM: MRI HEAD WITHOUT CONTRAST MRA HEAD WITHOUT CONTRAST TECHNIQUE: Multiplanar, multiecho pulse sequences of the brain and surrounding structures were obtained without intravenous contrast. Angiographic images of the head were obtained using MRA  technique without contrast. COMPARISON:  Head CT without contrast 06/11/2015. Brain MRI 06/03/2008 FINDINGS: MRI HEAD FINDINGS Major intracranial vascular flow voids are stable. Cerebral volume has not significantly changed since 2010. No restricted diffusion to suggest acute infarction. No midline shift, mass effect, evidence of mass lesion, ventriculomegaly, extra-axial collection or acute intracranial hemorrhage. Cervicomedullary junction and pituitary are within normal limits. Negative visualized cervical spine. Chronic lacunar infarct in the right thalamus is unchanged since 2010. Suggestion of chronic micro hemorrhage in the right parietal lobe (series 14, image 16). Otherwise gray and white matter signal is stable and within normal limits for age throughout the brain. Visible internal auditory structures appear normal. Mastoids are clear. Trace paranasal sinus mucosal thickening, improved since 2010. Negative orbit and scalp soft tissues. Visualized bone marrow signal is within normal limits. MRA HEAD FINDINGS Antegrade flow in the posterior circulation with codominant distal vertebral arteries. Fenestration of the distal left vertebral artery (normal variant). Normal right PICA origin. Patent vertebrobasilar junction. Patent dominant appearing left AICA origin. No basilar stenosis. SCA and left PCA origins are normal. Fetal type right PCA origin. Small left posterior communicating artery. Bilateral PCA branches are within normal limits. Antegrade flow in both ICA siphons. Moderate to severe irregularity in the distal cavernous segment of the right siphon best seen on series 104, image 5. Moderate stenosis there. Mild stenosis of the left siphon at the anterior genu. Normal ophthalmic and posterior communicating artery origins. Patent carotid termini. Dominant left ACA A1 segment. MCA and ACA origins appear within normal limits. Anterior communicating artery and visualized bilateral ACA branches are within  normal limits. Left MCA M1 segment, bifurcation, and left MCA branches are within normal limits. Right MCA M1 segment, bifurcation, and visualized right MCA branches are within normal limits. IMPRESSION: *No acute intracranial abnormality. 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 *Negative posterior circulation. Bilateral ICA siphon atherosclerosis, moderate to severe in the distal right cavernous segment with subsequent moderate stenosis. Otherwise negative anterior circulation. Electronically Signed   By: Genevie Ann M.D.   On: 06/12/2015 11:39    Micro Results  No results found for this or any previous visit (from the past 240 hour(s)).     Today   Subjective:   Phillip Reyes today has no headache,no chest abdominal pain,no new weakness tingling or numbness, feels much better wants to go home today.   Objective:   Blood pressure 156/76, pulse 53, temperature 98 F (36.7 C), temperature source Oral, resp. rate 18, height 5\' 7"  (1.702 m), weight 90.084 kg (198 lb  9.6 oz), SpO2 96 %.   Intake/Output Summary (Last 24 hours) at 06/13/15 1202 Last data filed at 06/13/15 0622  Gross per 24 hour  Intake      0 ml  Output   1150 ml  Net  -1150 ml    Exam Awake Alert, Oriented x 3, No new F.N deficits, Normal affect .AT,PERRAL Supple Neck,No JVD, No cervical lymphadenopathy appriciated.  Symmetrical Chest wall movement, Good air movement bilaterally, CTAB RRR,No Gallops,Rubs or new Murmurs, No Parasternal Heave +ve B.Sounds, Abd Soft, Non tender, No organomegaly appriciated, No rebound -guarding or rigidity. No Cyanosis, Clubbing or edema, No new Rash or bruise Intact neurological exam. Data Review   CBC w Diff: Lab Results  Component Value Date   WBC 5.2 06/11/2015   WBC 5.7 02/17/2013   HGB 13.3 06/11/2015   HGB 10.9* 02/17/2013   HCT 38.1* 06/11/2015   HCT 30.9* 02/17/2013   PLT 165 06/11/2015   PLT 159 02/17/2013   LYMPHOPCT 24.0 02/17/2013    MONOPCT 8.8 02/17/2013   EOSPCT 1.9 02/17/2013   BASOPCT 1.2 02/17/2013    CMP: Lab Results  Component Value Date   NA 137 06/11/2015   NA 142 02/17/2013   K 3.8 06/11/2015   K 3.9 02/17/2013   CL 104 06/11/2015   CL 110* 02/17/2013   CO2 25 06/11/2015   CO2 25 02/17/2013   BUN 24* 06/11/2015   BUN 18 02/17/2013   CREATININE 1.50* 06/11/2015   CREATININE 1.44* 02/17/2013   PROT 6.0* 06/11/2015   PROT 6.1* 02/16/2013   ALBUMIN 3.1* 06/11/2015   ALBUMIN 3.1* 02/16/2013   BILITOT 0.5 06/11/2015   BILITOT 0.3 02/16/2013   ALKPHOS 67 06/11/2015   ALKPHOS 91 02/16/2013   AST 17 06/11/2015   AST 17 02/16/2013   ALT 17 06/11/2015   ALT 15 02/16/2013  .   Total Time in preparing paper work, data evaluation and todays exam - 1 minutes  Phillip Reyes M.D on 06/13/2015 at 12:02 PM    Note: This dictation was prepared with Dragon dictation along with smaller phrase technology. Any transcriptional errors that result from this process are unintentional.

## 2015-06-13 NOTE — Progress Notes (Signed)
Patient ambulated hall around nursing station this am with nursing assistant, steady gait.  Ambulated now with nurse. Was able to go from lying in bed to sitting to standing without complaints of dizziness or swaying, was steady on feet and ambulated hall around nursing station with steady gait without walker. Lauris Poag, RN June 13, 2015 at 11:37.

## 2015-08-04 DIAGNOSIS — I69351 Hemiplegia and hemiparesis following cerebral infarction affecting right dominant side: Secondary | ICD-10-CM | POA: Insufficient documentation

## 2015-08-08 ENCOUNTER — Emergency Department
Admission: EM | Admit: 2015-08-08 | Discharge: 2015-08-09 | Disposition: A | Discharge status: Home / Self Care | Payer: Medicare HMO | Attending: Emergency Medicine | Admitting: Emergency Medicine

## 2015-08-08 ENCOUNTER — Emergency Department: Payer: Medicare HMO

## 2015-08-08 DIAGNOSIS — Z794 Long term (current) use of insulin: Secondary | ICD-10-CM | POA: Diagnosis not present

## 2015-08-08 DIAGNOSIS — I129 Hypertensive chronic kidney disease with stage 1 through stage 4 chronic kidney disease, or unspecified chronic kidney disease: Secondary | ICD-10-CM | POA: Diagnosis not present

## 2015-08-08 DIAGNOSIS — J449 Chronic obstructive pulmonary disease, unspecified: Secondary | ICD-10-CM | POA: Insufficient documentation

## 2015-08-08 DIAGNOSIS — Z859 Personal history of malignant neoplasm, unspecified: Secondary | ICD-10-CM | POA: Insufficient documentation

## 2015-08-08 DIAGNOSIS — N183 Chronic kidney disease, stage 3 (moderate): Secondary | ICD-10-CM | POA: Diagnosis not present

## 2015-08-08 DIAGNOSIS — E119 Type 2 diabetes mellitus without complications: Secondary | ICD-10-CM | POA: Insufficient documentation

## 2015-08-08 DIAGNOSIS — Z8673 Personal history of transient ischemic attack (TIA), and cerebral infarction without residual deficits: Secondary | ICD-10-CM | POA: Diagnosis not present

## 2015-08-08 DIAGNOSIS — R1110 Vomiting, unspecified: Secondary | ICD-10-CM | POA: Diagnosis not present

## 2015-08-08 DIAGNOSIS — R42 Dizziness and giddiness: Secondary | ICD-10-CM | POA: Insufficient documentation

## 2015-08-08 DIAGNOSIS — Z79899 Other long term (current) drug therapy: Secondary | ICD-10-CM | POA: Insufficient documentation

## 2015-08-08 DIAGNOSIS — Z7982 Long term (current) use of aspirin: Secondary | ICD-10-CM | POA: Insufficient documentation

## 2015-08-08 DIAGNOSIS — R0602 Shortness of breath: Secondary | ICD-10-CM | POA: Diagnosis not present

## 2015-08-08 LAB — COMPREHENSIVE METABOLIC PANEL
ALK PHOS: 75 U/L (ref 38–126)
ALT: 26 U/L (ref 17–63)
AST: 26 U/L (ref 15–41)
Albumin: 3.5 g/dL (ref 3.5–5.0)
Anion gap: 5 (ref 5–15)
BUN: 12 mg/dL (ref 6–20)
CALCIUM: 8.4 mg/dL — AB (ref 8.9–10.3)
CHLORIDE: 107 mmol/L (ref 101–111)
CO2: 24 mmol/L (ref 22–32)
CREATININE: 1.42 mg/dL — AB (ref 0.61–1.24)
GFR, EST AFRICAN AMERICAN: 55 mL/min — AB (ref >60)
GFR, EST NON AFRICAN AMERICAN: 47 mL/min — AB (ref >60)
Glucose, Bld: 120 mg/dL — ABNORMAL HIGH (ref 65–99)
Potassium: 3.5 mmol/L (ref 3.5–5.1)
Sodium: 136 mmol/L (ref 135–145)
Total Bilirubin: 0.4 mg/dL (ref 0.3–1.2)
Total Protein: 6.7 g/dL (ref 6.5–8.1)

## 2015-08-08 LAB — CBC
HEMATOCRIT: 31.7 % — AB (ref 40.0–52.0)
Hemoglobin: 11 g/dL — ABNORMAL LOW (ref 13.0–18.0)
MCH: 31.6 pg (ref 26.0–34.0)
MCHC: 34.6 g/dL (ref 32.0–36.0)
MCV: 91.3 fL (ref 80.0–100.0)
PLATELETS: 234 10*3/uL (ref 150–440)
RBC: 3.47 MIL/uL — ABNORMAL LOW (ref 4.40–5.90)
RDW: 13.9 % (ref 11.5–14.5)
WBC: 6 10*3/uL (ref 3.8–10.6)

## 2015-08-08 LAB — DIFFERENTIAL
BASOS ABS: 0.1 10*3/uL (ref 0–0.1)
BASOS PCT: 1 %
EOS ABS: 0.2 10*3/uL (ref 0–0.7)
Eosinophils Relative: 4 %
Lymphocytes Relative: 19 %
Lymphs Abs: 1.1 10*3/uL (ref 1.0–3.6)
MONO ABS: 0.7 10*3/uL (ref 0.2–1.0)
MONOS PCT: 12 %
NEUTROS ABS: 3.8 10*3/uL (ref 1.4–6.5)
Neutrophils Relative %: 64 %

## 2015-08-08 LAB — ETHANOL: Alcohol, Ethyl (B): <5 mg/dL (ref <5)

## 2015-08-08 LAB — PROTIME-INR
INR: 1.03
PROTHROMBIN TIME: 13.7 s (ref 11.4–15.0)

## 2015-08-08 LAB — APTT: APTT: 34 s (ref 24–36)

## 2015-08-08 LAB — GLUCOSE, CAPILLARY: GLUCOSE-CAPILLARY: 113 mg/dL — AB (ref 65–99)

## 2015-08-08 MED ORDER — DILTIAZEM LOAD VIA INFUSION
20.0000 mg | Freq: Once | INTRAVENOUS | Status: DC
  Filled 2015-08-08: qty 20

## 2015-08-08 MED ORDER — MECLIZINE HCL 25 MG PO TABS
25.0000 mg | ORAL_TABLET | Freq: Once | ORAL | Status: AC
  Administered 2015-08-08: 25 mg via ORAL
  Filled 2015-08-08: qty 1

## 2015-08-08 MED ORDER — ONDANSETRON HCL 4 MG/2ML IJ SOLN
4.0000 mg | Freq: Once | INTRAMUSCULAR | Status: AC
  Administered 2015-08-08: 4 mg via INTRAVENOUS
  Filled 2015-08-08: qty 2

## 2015-08-08 MED ORDER — ASPIRIN 81 MG PO CHEW
324.0000 mg | CHEWABLE_TABLET | Freq: Once | ORAL | Status: DC
Start: 2015-08-08 — End: 2015-08-08

## 2015-08-08 MED ORDER — FUROSEMIDE 10 MG/ML IJ SOLN: 60.0000 mg | Freq: Once | INTRAMUSCULAR | Status: DC

## 2015-08-08 MED ORDER — IPRATROPIUM-ALBUTEROL 0.5-2.5 (3) MG/3ML IN SOLN
3.0000 mL | Freq: Once | RESPIRATORY_TRACT | Status: AC
  Administered 2015-08-08: 3 mL via RESPIRATORY_TRACT
  Filled 2015-08-08: qty 3

## 2015-08-08 NOTE — ED Notes (Signed)
EMS pt from [facility] after experiencing sudden onset of dizziness with vomiting; pt's speech thick, stating he was at [facility] last week for CVA and received TPA there; pt says he's had 5 CVA's; thick speech is not new; right sided weakness following that CVA which he does not feel is worsened; c/o headache and dizziness currently; answering questions coherently but with soft speech;

## 2015-08-08 NOTE — ED Notes (Signed)
Pt's skin color improved to pink and dry; resting; says nausea is gone but dizziness remains

## 2015-08-08 NOTE — ED Notes (Addendum)
Daughter at bedside; states she does not observe any new deficits since previous stroke approximately one week ago

## 2015-08-08 NOTE — ED Notes (Addendum)
Family x 1 remains at bedside; pt and family understand MD wants to let pt rest about 45-60 minutes and then make a decision to discharge or admit depending on pt's response to medication; pt continues to report dizziness

## 2015-08-08 NOTE — ED Notes (Signed)
Dr. McShane at bedside.  

## 2015-08-08 NOTE — ED Provider Notes (Addendum)
[facility] Emergency Department Provider Note  ____________________________________________   I have reviewed the triage vital signs and the nursing notes.   HISTORY  Chief Complaint Dizziness and Emesis    HPI Phillip Reyes is a 75 y.o. male who has a history of recent CVA with right facial droop right-sided upper and lower extremity weakness, as well asdysarthria. He is reported to have had TPA at [facility] recently. He presents today however with symptoms he believes are unrelated. 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 His been going on for years. Predates his stroke. He states he has paroxysmal episodes of vertigo which come out of the bloody. He states nothing makes it better or worse. He states this is going on for a very long time. Patient denies any headache or any change in his post CVA symptoms which she is currently in rehabilitation. He states he has a true sensation of vertigo associated with nausea. His been going on since around 8:30. No other associated symptoms. Today, patient denies headache although there is indication of the triage note that he may have told them he had it.     Past Medical History  Diagnosis Date  . Diabetes mellitus without complication ([place])   . Cancer ([place])   . COPD (chronic obstructive pulmonary disease) ([place])   . Hypertension   . Renal disorder   . Stroke ([place])   . Arthritis     Patient Active Problem List   Diagnosis Date Noted  . Acute encephalopathy 06/11/2015  . Type 2 diabetes mellitus ([place]) 06/11/2015  . HTN (hypertension) 06/11/2015  . COPD (chronic obstructive pulmonary disease) ([place]) 06/11/2015  . CKD (chronic kidney disease), stage III 06/11/2015  . H/O: stroke 06/11/2015    Past Surgical History  Procedure Laterality Date  . Appendectomy      Current Outpatient Rx  Name  Route  Sig  Dispense  Refill  . amitriptyline (ELAVIL) 25 MG tablet   Oral   Take 25 mg by mouth at bedtime.          Marland Kitchen amLODipine (NORVASC) 5 MG tablet   Oral   Take 5 mg by mouth daily.         Marland Kitchen aspirin EC 81 MG tablet   Oral   Take 81 mg by mouth daily.         Marland Kitchen atorvastatin (LIPITOR) 80 MG tablet   Oral   Take 80 mg by mouth at bedtime.         . cholecalciferol (VITAMIN D) 1000 units tablet   Oral   Take 2,000 Units by mouth daily.         . citalopram (CELEXA) 10 MG tablet   Oral   Take 10 mg by mouth daily.         . clopidogrel (PLAVIX) 75 MG tablet   Oral   Take 75 mg by mouth daily.         Marland Kitchen doxazosin (CARDURA) 8 MG tablet   Oral   Take 8 mg by mouth at bedtime.         . furosemide (LASIX) 40 MG tablet   Oral   Take 40 mg by mouth.         . gabapentin (NEURONTIN) 400 MG capsule   Oral   Take 400 mg by mouth 3 (three) times daily.         . hydrocerin (EUCERIN) CREA   Topical   Apply 1 application  topically 2 (two) times daily.         . insulin glargine (LANTUS) 100 UNIT/ML injection   Subcutaneous   Inject 40-55 Units into the skin See admin instructions. Inject 40 units at bedtime and 55 units in the morning with breakfast         . insulin lispro (HUMALOG) 100 UNIT/ML injection   Subcutaneous   Inject 8 Units into the skin 3 (three) times daily with meals.         . Ipratropium-Albuterol (COMBIVENT RESPIMAT) 20-100 MCG/ACT AERS respimat   Inhalation   Inhale 1 puff into the lungs every 6 (six) hours as needed for wheezing or shortness of breath.         Marland Kitchen ipratropium-albuterol (DUONEB) 0.5-2.5 (3) MG/3ML SOLN   Nebulization   Take 3 mLs by nebulization every 6 (six) hours as needed.         . loratadine (CLARITIN) 10 MG tablet   Oral   Take 10 mg by mouth daily.         . ondansetron (ZOFRAN) 8 MG tablet   Oral   Take 4 mg by mouth every 8 (eight) hours as needed for nausea or vomiting.         . potassium chloride (K-DUR,KLOR-CON) 10 MEQ tablet   Oral   Take 10 mEq by mouth daily.         .  sennosides-docusate sodium (SENOKOT-S) 8.6-50 MG tablet   Oral   Take 1 tablet by mouth daily.         Marland Kitchen terbinafine (LAMISIL) 1 % cream   Topical   Apply 1 application topically See admin instructions. Apply to feet and hands every Sunday and Friday         . vitamin B-12 (CYANOCOBALAMIN) 1000 MCG tablet   Oral   Take 1,000 mcg by mouth daily.           Allergies Norco  Family History  Problem Relation Age of Onset  . Congestive Heart Failure Father     Social History Social History  Substance Use Topics  . Smoking status: Current Every Day Smoker  . Smokeless tobacco: Not on file  . Alcohol Use: No    Review of Systems Constitutional: No fever/chills Eyes: No visual changes. ENT: No sore throat. No stiff neck no neck pain Cardiovascular: Denies chest pain. Respiratory: Denies shortness of breath. Gastrointestinal:   Positive vomiting.  No diarrhea.  No constipation. Genitourinary: Negative for dysuria. Musculoskeletal: Negative lower extremity swelling Skin: Negative for rash. Neurological: Negative for headaches, focal weakness or numbness. 10-point ROS otherwise negative.  ____________________________________________   PHYSICAL EXAM:  VITAL SIGNS: ED Triage Vitals  Enc Vitals Group     BP 08/08/15 2054 195/88 mmHg     Pulse Rate 08/08/15 2054 77     Resp 08/08/15 2054 19     Temp 08/08/15 2054 97.5 F (36.4 C)     Temp Source 08/08/15 2054 Oral     SpO2 08/08/15 2054 97 %     Weight 08/08/15 2054 190 lb (86.183 kg)     Height 08/08/15 2054 5\' 7"  (1.702 m)     Head Cir --      Peak Flow --      Pain Score 08/08/15 2054 10     Pain Loc --      Pain Edu? --      Excl. in [place]? --     Constitutional: Alert and oriented. In  no acute distress Eyes: Conjunctivae are normal. PERRL. EOMI. Head: Atraumatic. Nose: No congestion/rhinnorhea. Mouth/Throat: Mucous membranes are moist.  Oropharynx non-erythematous. Neck: No stridor.   Nontender with no  meningismus Cardiovascular: Normal rate, regular rhythm. Grossly normal heart sounds.  Good peripheral circulation. Respiratory: Normal respiratory effort.  No retractions. Occasional slight wheeze noted, Abdominal: Soft and nontender. No distention. No guarding no rebound Back:  There is no focal tenderness or step off there is no midline tenderness there are no lesions noted. there is no CVA tenderness Musculoskeletal: No lower extremity tenderness. No joint effusions, no DVT signs strong distal pulses no edema Neurologic: Patient with an expressive aphasia, right-sided facial droop, right upper and lower extremity weakness versus left. Finger to nose is limited by patient generalized weakness. Skin:  Skin is warm, dry and intact. No rash noted. Psychiatric: Mood and affect are normal. Speech and behavior are normal.  ____________________________________________   LABS (all labs ordered are listed, but only abnormal results are displayed)  Labs Reviewed  CBC - Abnormal; Notable for the following:    RBC 3.47 (*)    Hemoglobin 11.0 (*)    HCT 31.7 (*)    All other components within normal limits  COMPREHENSIVE METABOLIC PANEL - Abnormal; Notable for the following:    Glucose, Bld 120 (*)    Creatinine, Ser 1.42 (*)    Calcium 8.4 (*)    GFR calc non Af Amer 47 (*)    GFR calc Af Amer 55 (*)    All other components within normal limits  GLUCOSE, CAPILLARY - Abnormal; Notable for the following:    Glucose-Capillary 113 (*)    All other components within normal limits  ETHANOL  PROTIME-INR  APTT  DIFFERENTIAL  TROPONIN I   ____________________________________________  EKG  I personally interpreted any EKGs ordered by me or triage Normal sinus rhythm rate 66 bpm no acute ST elevation or acute ST depression on specific ST changes noted ____________________________________________  RADIOLOGY  I reviewed any imaging ordered by me or triage that were performed during my shift  and, if possible, patient and/or family made aware of any abnormal findings. ____________________________________________   PROCEDURES  Procedure(s) performed: None  Critical Care performed: None  ____________________________________________   INITIAL IMPRESSION / ASSESSMENT AND PLAN / ED COURSE  Pertinent labs & imaging results that were available during my care of the patient were reviewed by me and considered in my medical decision making (see chart for details).  Patient with true vertigo symptoms which are chronic for "years". We did call a code stroke because of the acute nature of the symptoms and our initial unawareness of their recurrent chronicity. Patient at this time is feeling much better. At this time his blood pressure is trending down, he has baseline neurologic deficits which patient and family state are unchanged. He does eat thickened feeds we are able to get him Antivert and antinausea medication. Because he has missed his evening given as we gave him a DuoNeb and his lungs at this time much clear, he has fallen peacefully asleep at this time, my plan is to observe him and see if when he wakes up he feels better and if he does we will try to get him back to rehabilitation and if he does not remaining to admit the patient for further evaluation. His daughter at the bedside is very comfortable with this plan.  ----------------------------------------- 11:10 PM on 08/08/2015 -----------------------------------------  Signed out to dr. Dahlia Client at the end  of my shift. ____________________________________________   FINAL CLINICAL IMPRESSION(S) / ED DIAGNOSES  Final diagnoses:  SOB (shortness of breath)      This chart was dictated using voice recognition software.  Despite best efforts to proofread,  errors can occur which can change meaning.     Schuyler Amor, MD 08/08/15 [place], MD 08/08/15 [phone]

## 2015-08-08 NOTE — ED Notes (Signed)
Daughter reports pt on nectar thick liquids at the nursing home;

## 2015-08-08 NOTE — ED Notes (Signed)
Radiology tech at bedside for chest x-ray

## 2015-08-09 LAB — TROPONIN I: Troponin I: <0.03 ng/mL (ref <0.031)

## 2015-08-09 MED ORDER — LORAZEPAM 1 MG PO TABS: 1.0000 mg | ORAL_TABLET | Freq: Three times a day (TID) | ORAL | Status: DC | PRN

## 2015-08-09 MED ORDER — LORAZEPAM 2 MG/ML IJ SOLN
1.0000 mg | Freq: Once | INTRAMUSCULAR | Status: AC
  Administered 2015-08-09: 1 mg via INTRAVENOUS
  Filled 2015-08-09: qty 1

## 2015-08-09 NOTE — ED Notes (Signed)
Pt assisted with urinal; unable to void; ativan has been given for continued c/o dizziness

## 2015-08-09 NOTE — ED Notes (Signed)
Pt easily awakened; denies dizziness at this time; MD aware

## 2015-08-09 NOTE — Discharge Instructions (Signed)
Dizziness Dizziness is a common problem. It is a feeling of unsteadiness or light-headedness. You may feel like you are about to faint. Dizziness can lead to injury if you stumble or fall. Anyone can become dizzy, but dizziness is more common in older adults. This condition can be caused by a number of things, including medicines, dehydration, or illness. HOME CARE INSTRUCTIONS Taking these steps may help with your condition: Eating and Drinking  Drink enough fluid to keep your urine clear or pale yellow. This helps to keep you from becoming dehydrated. Try to drink more clear fluids, such as water.  Do not drink alcohol.  Limit your caffeine intake if directed by your health care provider.  Limit your salt intake if directed by your health care provider. Activity  Avoid making quick movements.  Rise slowly from chairs and steady yourself until you feel okay.  In the morning, first sit up on the side of the bed. When you feel okay, stand slowly while you hold onto something until you know that your balance is fine.  Move your legs often if you need to stand in one place for a long time. Tighten and relax your muscles in your legs while you are standing.  Do not drive or operate heavy machinery if you feel dizzy.  Avoid bending down if you feel dizzy. Place items in your home so that they are easy for you to reach without leaning over. Lifestyle  Do not use any tobacco products, including cigarettes, chewing tobacco, or electronic cigarettes. If you need help quitting, ask your health care provider.  Try to reduce your stress level, such as with yoga or meditation. Talk with your health care provider if you need help. General Instructions  Watch your dizziness for any changes.  Take medicines only as directed by your health care provider. Talk with your health care provider if you think that your dizziness is caused by a medicine that you are taking.  Tell a friend or a family  member that you are feeling dizzy. If he or she notices any changes in your behavior, have this person call your health care provider.  Keep all follow-up visits as directed by your health care provider. This is important. SEEK MEDICAL CARE IF:  Your dizziness does not go away.  Your dizziness or light-headedness gets worse.  You feel nauseous.  You have reduced hearing.  You have new symptoms.  You are unsteady on your feet or you feel like the room is spinning. SEEK IMMEDIATE MEDICAL CARE IF:  You vomit or have diarrhea and are unable to eat or drink anything.  You have problems talking, walking, swallowing, or using your arms, hands, or legs.  You feel generally weak.  You are not thinking clearly or you have trouble forming sentences. It may take a friend or family member to notice this.  You have chest pain, abdominal pain, shortness of breath, or sweating.  Your vision changes.  You notice any bleeding.  You have a headache.  You have neck pain or a stiff neck.  You have a fever.   This information is not intended to replace advice given to you by your health care provider. Make sure you discuss any questions you have with your health care provider.   Document Released: 10/18/2000 Document Revised: 09/08/2014 Document Reviewed: 04/20/2014 Elsevier Interactive Patient Education 2016 [place] of Breath Shortness of breath means you have trouble breathing. Shortness of breath needs medical care right  away. HOME CARE   Do not smoke.  Avoid being around chemicals or things (paint fumes, dust) that may bother your breathing.  Rest as needed. Slowly begin your normal activities.  Only take medicines as told by your doctor.  Keep all doctor visits as told. GET HELP RIGHT AWAY IF:   Your shortness of breath gets worse.  You feel lightheaded, pass out (faint), or have a cough that is not helped by medicine.  You cough up blood.  You have  pain with breathing.  You have pain in your chest, arms, shoulders, or belly (abdomen).  You have a fever.  You cannot walk up stairs or exercise the way you normally do.  You do not get better in the time expected.  You have a hard time doing normal activities even with rest.  You have problems with your medicines.  You have any new symptoms. MAKE SURE YOU:  Understand these instructions.  Will watch your condition.  Will get help right away if you are not doing well or get worse.   This information is not intended to replace advice given to you by your health care provider. Make sure you discuss any questions you have with your health care provider.   Document Released: 10/11/2007 Document Revised: 04/29/2013 Document Reviewed: 07/10/2011 Elsevier Interactive Patient Education [employer].

## 2015-08-09 NOTE — ED Provider Notes (Signed)
-----------------------------------------   3:17 AM on 08/09/2015 -----------------------------------------   Blood pressure 141/85, pulse 65, temperature 97.5 F (36.4 C), temperature source Oral, resp. rate 18, height 5\' 7"  (1.702 m), weight 190 lb (86.183 kg), SpO2 96 %.  Assuming care from Dr. Burlene Arnt.  In short, Phillip Reyes is a 75 y.o. male with a chief complaint of Dizziness and Emesis .  Refer to the original H&P for additional details.  The current plan of care is to reassess the patient after the meclizine.  Patient did continue to have some dizziness and room spinning after the meclizine. I did order a dose of Ativan 1 mg IV for the patient. After the Ativan the patient's dizziness had resolved. He was sleeping without any difficulty. I will discharge the patient to home and have him follow back up with his neurologist. I discussed this with the patient's family and they have no further questions or concerns. The patient will be discharged back to rehabilitation facility.   Loney Hering, MD 08/09/15 [phone]

## 2015-11-14 ENCOUNTER — Emergency Department: Payer: Medicare HMO

## 2015-11-14 ENCOUNTER — Emergency Department
Admission: EM | Admit: 2015-11-14 | Discharge: 2015-11-14 | Payer: Medicare HMO | Attending: Emergency Medicine | Admitting: Emergency Medicine

## 2015-11-14 ENCOUNTER — Encounter: Payer: Self-pay | Admitting: Emergency Medicine

## 2015-11-14 DIAGNOSIS — R079 Chest pain, unspecified: Secondary | ICD-10-CM | POA: Diagnosis not present

## 2015-11-14 DIAGNOSIS — Z7982 Long term (current) use of aspirin: Secondary | ICD-10-CM | POA: Insufficient documentation

## 2015-11-14 DIAGNOSIS — Z794 Long term (current) use of insulin: Secondary | ICD-10-CM | POA: Diagnosis not present

## 2015-11-14 DIAGNOSIS — E1122 Type 2 diabetes mellitus with diabetic chronic kidney disease: Secondary | ICD-10-CM | POA: Diagnosis not present

## 2015-11-14 DIAGNOSIS — Z79899 Other long term (current) drug therapy: Secondary | ICD-10-CM | POA: Diagnosis not present

## 2015-11-14 DIAGNOSIS — N183 Chronic kidney disease, stage 3 (moderate): Secondary | ICD-10-CM | POA: Insufficient documentation

## 2015-11-14 DIAGNOSIS — Z859 Personal history of malignant neoplasm, unspecified: Secondary | ICD-10-CM | POA: Diagnosis not present

## 2015-11-14 DIAGNOSIS — I129 Hypertensive chronic kidney disease with stage 1 through stage 4 chronic kidney disease, or unspecified chronic kidney disease: Secondary | ICD-10-CM | POA: Diagnosis not present

## 2015-11-14 DIAGNOSIS — Z87891 Personal history of nicotine dependence: Secondary | ICD-10-CM | POA: Insufficient documentation

## 2015-11-14 DIAGNOSIS — J449 Chronic obstructive pulmonary disease, unspecified: Secondary | ICD-10-CM | POA: Diagnosis not present

## 2015-11-14 DIAGNOSIS — I160 Hypertensive urgency: Secondary | ICD-10-CM | POA: Insufficient documentation

## 2015-11-14 DIAGNOSIS — R0602 Shortness of breath: Secondary | ICD-10-CM

## 2015-11-14 DIAGNOSIS — Z8673 Personal history of transient ischemic attack (TIA), and cerebral infarction without residual deficits: Secondary | ICD-10-CM | POA: Diagnosis not present

## 2015-11-14 DIAGNOSIS — M1990 Unspecified osteoarthritis, unspecified site: Secondary | ICD-10-CM | POA: Diagnosis not present

## 2015-11-14 DIAGNOSIS — R1013 Epigastric pain: Secondary | ICD-10-CM

## 2015-11-14 LAB — URINALYSIS COMPLETE WITH MICROSCOPIC (ARMC ONLY)
BACTERIA UA: NONE SEEN
Bilirubin Urine: NEGATIVE
HGB URINE DIPSTICK: NEGATIVE
Ketones, ur: NEGATIVE mg/dL
Leukocytes, UA: NEGATIVE
NITRITE: NEGATIVE
PH: 7 (ref 5.0–8.0)
PROTEIN: 100 mg/dL — AB
Specific Gravity, Urine: 1.016 (ref 1.005–1.030)
Squamous Epithelial / LPF: NONE SEEN

## 2015-11-14 LAB — COMPREHENSIVE METABOLIC PANEL
ALT: 14 U/L — AB (ref 17–63)
AST: 14 U/L — AB (ref 15–41)
Albumin: 3.7 g/dL (ref 3.5–5.0)
Alkaline Phosphatase: 72 U/L (ref 38–126)
Anion gap: 7 (ref 5–15)
BUN: 18 mg/dL (ref 6–20)
CHLORIDE: 99 mmol/L — AB (ref 101–111)
CO2: 28 mmol/L (ref 22–32)
CREATININE: 1.87 mg/dL — AB (ref 0.61–1.24)
Calcium: 9 mg/dL (ref 8.9–10.3)
GFR calc non Af Amer: 34 mL/min — ABNORMAL LOW (ref >60)
GFR, EST AFRICAN AMERICAN: 39 mL/min — AB (ref >60)
Glucose, Bld: 306 mg/dL — ABNORMAL HIGH (ref 65–99)
POTASSIUM: 4.3 mmol/L (ref 3.5–5.1)
SODIUM: 134 mmol/L — AB (ref 135–145)
Total Bilirubin: 0.7 mg/dL (ref 0.3–1.2)
Total Protein: 7.1 g/dL (ref 6.5–8.1)

## 2015-11-14 LAB — TROPONIN I: Troponin I: <0.03 ng/mL (ref <0.03)

## 2015-11-14 LAB — CBC
HEMATOCRIT: 40.3 % (ref 40.0–52.0)
HEMOGLOBIN: 14.4 g/dL (ref 13.0–18.0)
MCH: 32.3 pg (ref 26.0–34.0)
MCHC: 35.8 g/dL (ref 32.0–36.0)
MCV: 90.3 fL (ref 80.0–100.0)
Platelets: 230 10*3/uL (ref 150–440)
RBC: 4.46 MIL/uL (ref 4.40–5.90)
RDW: 13.8 % (ref 11.5–14.5)
WBC: 6.4 10*3/uL (ref 3.8–10.6)

## 2015-11-14 LAB — LIPASE, BLOOD: LIPASE: 24 U/L (ref 11–51)

## 2015-11-14 MED ORDER — NITROGLYCERIN 2 % TD OINT
TOPICAL_OINTMENT | TRANSDERMAL | Status: AC
  Administered 2015-11-14: 1 [in_us] via TOPICAL
  Filled 2015-11-14: qty 1

## 2015-11-14 MED ORDER — HYDROMORPHONE HCL 1 MG/ML IJ SOLN
INTRAMUSCULAR | Status: AC
  Administered 2015-11-14: 1 mg via INTRAVENOUS
  Filled 2015-11-14: qty 1

## 2015-11-14 MED ORDER — HYDROMORPHONE HCL 1 MG/ML IJ SOLN
1.0000 mg | Freq: Once | INTRAMUSCULAR | Status: AC
  Administered 2015-11-14: 1 mg via INTRAVENOUS

## 2015-11-14 MED ORDER — NITROGLYCERIN 0.4 MG SL SUBL
0.4000 mg | SUBLINGUAL_TABLET | SUBLINGUAL | Status: DC | PRN
Start: 2015-11-14 — End: 2015-11-15
  Administered 2015-11-14: 0.4 mg via SUBLINGUAL
  Filled 2015-11-14: qty 1

## 2015-11-14 MED ORDER — ASPIRIN EC 325 MG PO TBEC
325.0000 mg | DELAYED_RELEASE_TABLET | Freq: Once | ORAL | Status: AC
  Administered 2015-11-14: 325 mg via ORAL
  Filled 2015-11-14: qty 1

## 2015-11-14 MED ORDER — NITROGLYCERIN 2 % TD OINT
1.0000 [in_us] | TOPICAL_OINTMENT | Freq: Once | TRANSDERMAL | Status: AC
  Administered 2015-11-14: 1 [in_us] via TOPICAL

## 2015-11-14 NOTE — ED Notes (Signed)
Patient transported to Ultrasound 

## 2015-11-14 NOTE — ED Notes (Signed)
Called [facility] in [place] to let them know pt. Was on his way to there facility.

## 2015-11-14 NOTE — ED Provider Notes (Addendum)
[facility] Emergency Department Provider Note  ____________________________________________  Time seen: Approximately 5:22 PM  I have reviewed the triage vital signs and the nursing notes.   HISTORY  Chief Complaint Chest Pain and Abdominal Pain    HPI Phillip Reyes is a 75 y.o. male with a history of HTN, DM, COPD, CVA on Plavix presenting with constant chest pain for two weeks. The patient reports the pain started when he was lying in bed as a severe central chest pain "under the breast bone." It did not radiate, and he did not have any associated shortness of breath, palpitations, lightheadedness or syncope. He does have some mild shortness of breath with exertion. He denies any lower extremity swelling or calf pain, long car trips. The pain is nonpleuritic, and the pain has been constant. It is not worse with food but does worsen with positional changes. He reports he has been to the [facility] 6 times over the past 2 weeks for this pain and been admitted 3 times. He does not remember having stress testing or cardiac catheterization there.     Past Medical History  Diagnosis Date  . Diabetes mellitus without complication ([place])   . Cancer ([place])   . COPD (chronic obstructive pulmonary disease) ([place])   . Hypertension   . Renal disorder   . Stroke ([place])   . Arthritis     Patient Active Problem List   Diagnosis Date Noted  . Acute encephalopathy 06/11/2015  . Type 2 diabetes mellitus ([place]) 06/11/2015  . HTN (hypertension) 06/11/2015  . COPD (chronic obstructive pulmonary disease) ([place]) 06/11/2015  . CKD (chronic kidney disease), stage III 06/11/2015  . H/O: stroke 06/11/2015    Past Surgical History  Procedure Laterality Date  . Appendectomy    . Throat surgery      Current Outpatient Rx  Name  Route  Sig  Dispense  Refill  . amitriptyline (ELAVIL) 25 MG tablet   Oral   Take 25 mg by mouth at bedtime.         Marland Kitchen amLODipine (NORVASC) 5 MG  tablet   Oral   Take 5 mg by mouth daily.         Marland Kitchen aspirin EC 81 MG tablet   Oral   Take 81 mg by mouth daily.         Marland Kitchen atorvastatin (LIPITOR) 80 MG tablet   Oral   Take 80 mg by mouth at bedtime.         . cholecalciferol (VITAMIN D) 1000 units tablet   Oral   Take 2,000 Units by mouth daily.         . citalopram (CELEXA) 10 MG tablet   Oral   Take 10 mg by mouth daily.         . clopidogrel (PLAVIX) 75 MG tablet   Oral   Take 75 mg by mouth daily.         Marland Kitchen doxazosin (CARDURA) 8 MG tablet   Oral   Take 8 mg by mouth at bedtime.         . furosemide (LASIX) 40 MG tablet   Oral   Take 40 mg by mouth.         . gabapentin (NEURONTIN) 400 MG capsule   Oral   Take 400 mg by mouth 3 (three) times daily.         . hydrocerin (EUCERIN) CREA   Topical   Apply 1 application topically 2 (two)  times daily.         . insulin glargine (LANTUS) 100 UNIT/ML injection   Subcutaneous   Inject 40-55 Units into the skin See admin instructions. Inject 40 units at bedtime and 55 units in the morning with breakfast         . insulin lispro (HUMALOG) 100 UNIT/ML injection   Subcutaneous   Inject 8 Units into the skin 3 (three) times daily with meals.         . Ipratropium-Albuterol (COMBIVENT RESPIMAT) 20-100 MCG/ACT AERS respimat   Inhalation   Inhale 1 puff into the lungs every 6 (six) hours as needed for wheezing or shortness of breath.         Marland Kitchen ipratropium-albuterol (DUONEB) 0.5-2.5 (3) MG/3ML SOLN   Nebulization   Take 3 mLs by nebulization every 6 (six) hours as needed.         . loratadine (CLARITIN) 10 MG tablet   Oral   Take 10 mg by mouth daily.         Marland Kitchen LORazepam (ATIVAN) 1 MG tablet   Oral   Take 1 tablet (1 mg total) by mouth every 8 (eight) hours as needed (dizziness).   12 tablet   0   . ondansetron (ZOFRAN) 8 MG tablet   Oral   Take 4 mg by mouth every 8 (eight) hours as needed for nausea or vomiting.         .  potassium chloride (K-DUR,KLOR-CON) 10 MEQ tablet   Oral   Take 10 mEq by mouth daily.         . sennosides-docusate sodium (SENOKOT-S) 8.6-50 MG tablet   Oral   Take 1 tablet by mouth daily.         Marland Kitchen terbinafine (LAMISIL) 1 % cream   Topical   Apply 1 application topically See admin instructions. Apply to feet and hands every Sunday and Friday         . vitamin B-12 (CYANOCOBALAMIN) 1000 MCG tablet   Oral   Take 1,000 mcg by mouth daily.           Allergies Norco  Family History  Problem Relation Age of Onset  . Congestive Heart Failure Father     Social History Social History  Substance Use Topics  . Smoking status: Former Smoker    Quit date: 10/31/2015  . Smokeless tobacco: None  . Alcohol Use: No    Review of Systems Constitutional: No fever/chills.No lightheadedness or syncope. Eyes: No visual changes. ENT: No sore throat. No congestion or rhinorrhea. Cardiovascular: Positive chest pain. Denies palpitations. Respiratory: Positive exertional shortness of breath.  No cough. Gastrointestinal: No abdominal pain.  No nausea, no vomiting.  No diarrhea.  No constipation. Genitourinary: Negative for dysuria. Musculoskeletal: Negative for back pain. Skin: Negative for rash. Neurological: Negative for headaches. No focal numbness, tingling or weakness.   10-point ROS otherwise negative.  ____________________________________________   PHYSICAL EXAM:  VITAL SIGNS: ED Triage Vitals  Enc Vitals Group     BP 11/14/15 1508 168/66 mmHg     Pulse Rate 11/14/15 1508 65     Resp 11/14/15 1508 20     Temp 11/14/15 1508 97.5 F (36.4 C)     Temp Source 11/14/15 1508 Oral     SpO2 11/14/15 1508 98 %     Weight 11/14/15 1508 184 lb (83.462 kg)     Height 11/14/15 1508 5' 8.5" (1.74 m)     Head Cir --  Peak Flow --      Pain Score 11/14/15 1509 10     Pain Loc --      Pain Edu? --      Excl. in [place]? --     Constitutional: Alert and oriented.  Uncomfortable appearing but nontoxic. Answers questions appropriately. Eyes: Conjunctivae are normal.  EOMI. No scleral icterus. Head: Atraumatic. Nose: No congestion/rhinnorhea. Mouth/Throat: Mucous membranes are moist.  Neck: No stridor.  Supple.  No JVD. No meningismus. Cardiovascular: Normal rate, regular rhythm. No murmurs, rubs or gallops.  Respiratory: Normal respiratory effort.  No accessory muscle use or retractions. Lungs CTAB.  No wheezes, rales or ronchi. Gastrointestinal: Soft, nontender and nondistended.  No guarding or rebound.  No peritoneal signs. Musculoskeletal: No LE edema. No ttp in the calves or palpable cords.  Negative Homan's sign. Neurologic:  A&Ox3.  Speech is clear.  Face and smile are symmetric.  EOMI.  Moves all extremities well. Skin:  Skin is warm, dry and intact. No rash noted. Psychiatric: Mood and affect are normal. Speech and behavior are normal.    ____________________________________________   LABS (all labs ordered are listed, but only abnormal results are displayed)  Labs Reviewed  COMPREHENSIVE METABOLIC PANEL - Abnormal; Notable for the following:    Sodium 134 (*)    Chloride 99 (*)    Glucose, Bld 306 (*)    Creatinine, Ser 1.87 (*)    AST 14 (*)    ALT 14 (*)    GFR calc non Af Amer 34 (*)    GFR calc Af Amer 39 (*)    All other components within normal limits  URINALYSIS COMPLETEWITH MICROSCOPIC ([facility] ONLY) - Abnormal; Notable for the following:    Color, Urine YELLOW (*)    APPearance CLEAR (*)    Glucose, UA >500 (*)    Protein, ur 100 (*)    All other components within normal limits  CBC  TROPONIN I  LIPASE, BLOOD   ____________________________________________  EKG  ED ECG REPORT I, Eula Listen, the attending physician, personally viewed and interpreted this ECG.   Date: 11/14/2015  EKG Time: 1628  Rate: 61  Rhythm: normal sinus rhythm  Axis: leftward  Intervals:none  ST&T Change: no  STEMI  ____________________________________________  RADIOLOGY  Dg Chest 2 View  11/14/2015  CLINICAL DATA:  Chest pain for 6 days EXAM: CHEST  2 VIEW COMPARISON:  08/08/2015 FINDINGS: Normal mediastinum and cardiac silhouette. Normal pulmonary vasculature. No evidence of effusion, infiltrate, or pneumothorax. No acute bony abnormality. Degenerative osteophytosis of the thoracic spine. IMPRESSION: No acute cardiopulmonary process. Electronically Signed   By: Suzy Bouchard M.D.   On: 11/14/2015 16:52   US Abdomen Limited Ruq  11/14/2015  CLINICAL DATA:  75 year old male with epigastric pain EXAM: US ABDOMEN LIMITED - RIGHT UPPER QUADRANT COMPARISON:  None. FINDINGS: Gallbladder: There multiple small stones within the gallbladder. A 4 mm echogenic focus along the gallbladder wall may represent a sludge ball, small stone, or a polyp. There is no gallbladder wall thickening or pericholecystic fluid. Negative sonographic Murphy's sign. Common bile duct: Diameter: 4 mm Liver: There is mild diffuse fatty infiltration of the liver. IMPRESSION: Cholelithiasis without sonographic evidence of acute cholecystitis. A hepatobiliary scintigraphy may provide better evaluation of the gallbladder if there is high clinical concern for an acute cholecystitis. Electronically Signed   By: Anner Crete M.D.   On: 11/14/2015 18:52    ____________________________________________   PROCEDURES  Procedure(s) performed: None  Critical Care  performed: No ____________________________________________   INITIAL IMPRESSION / ASSESSMENT AND PLAN / ED COURSE  Pertinent labs & imaging results that were available during my care of the patient were reviewed by me and considered in my medical decision making (see chart for details).  75 y.o. male with a history of HTN, COPD and DM presenting for 2 weeks of ongoing chest pain with 3 prior hospitalizations at the [facility] for the same. Overall, the patient is mildly  hypertensive but otherwise has stable vital signs and an EKG that does not have ischemic changes. His troponin is negative and his pain has been constant for 2 weeks. The pain is nonpleuritic, he is not tachycardic, and he has normal oxygenation without any evidence of DVT so PE is an unlikely cause for the patient's pain.  I will plan to treat the patient's pain with nitroglycerin, and tried to reach the hospitalist on call for the [facility] to have a better sense of the workup that was completed for this patient.  ----------------------------------------- 5:51 PM on 11/14/2015 -----------------------------------------  I was able to speak with a physician in the emergency department who pulled up the patient's recent charts. The patient has had multiple admissions (6/25, 6/29, and 7/7) where he presented with epigastric pain radiating to the back.  A CT angiogram showed some mild gallbladder wall prominence, but ruled out PE or aortic pathology at the time, 10/31/15. The patient also had an admission requiring ICU admission for hyper tensive or urgency. He did not have any risk stratification studies, including stress test or cardiac catheterization. He did have an echo but the results are unavailable. The patient was discharged with omeprazole for possible gastric ulcer. During all of his hospitalizations, his troponins have always been negative.  ----------------------------------------- 5:55 PM on 11/14/2015 -----------------------------------------  After a single SL nitroglycerin, the patient's chest pain completely disappeared, but now he is having 10/10 back pain. I will place 1 inch of Nitropaste on his chest, and give the patient Dilaudid for his pain. The patient has a new renal insufficiency today, and recently was evaluated for aortic pathology which was negative. When the risk-benefit ratio of renal failure due to IV contrast dye versus the risk that he has aortic dissection causing pain  for this length of time with a negative previous study, I do not think CT angiogram is indicated at this time. We'll continue to treat him symptomatically, get an ultrasound to evaluate for gallbladder disease, and then likely proceed with transfer for admission to the [place].  ----------------------------------------- 8:12 PM on 11/14/2015 -----------------------------------------  Patient's workup here has been reassuring. He has normal LFTs and his right upper quadrant ultrasound does not show any gallbladder pathology. However, I am still concerned about his chest pain given his cardiac risk factors and the fact that he has not had any wrist or medication study. I have initiated transfer to the [facility] for further evaluation. At this time, the patient remains hemodynamically stable and his blood pressure significantly improved with nitroglycerin glycerin.   At this time, the patient is resting comfortably and understands and agrees with the plan.  9:10 PM  The patient has been accepted for transfer to the [place].  ____________________________________________  FINAL CLINICAL IMPRESSION(S) / ED DIAGNOSES  Final diagnoses:  Epigastric pain  Hypertensive urgency  Chest pain, unspecified chest pain type  Exertional shortness of breath      NEW MEDICATIONS STARTED DURING THIS VISIT:  New Prescriptions   No medications on file  Eula Listen, MD 11/14/15 2015  Eula Listen, MD 11/14/15 2110

## 2015-11-14 NOTE — ED Notes (Addendum)
Patient presents to the ED with left upper quadrant pain radiating into his left chest x 3 days.  Patient reports some shortness of breath and nausea.  Denies vomiting.  Patient states, "I've been to the [place] six times for this and they can't find nothing wrong but something's got to give."

## 2015-11-14 NOTE — ED Notes (Signed)
Wife called for update on pt.  Gave phone to pt. So he could give her update.

## 2015-11-23 ENCOUNTER — Emergency Department
Admission: EM | Admit: 2015-11-23 | Discharge: 2015-11-23 | Disposition: A | Discharge status: Home / Self Care | Payer: Medicare HMO | Attending: Emergency Medicine | Admitting: Emergency Medicine

## 2015-11-23 ENCOUNTER — Emergency Department: Payer: Medicare HMO

## 2015-11-23 DIAGNOSIS — Z7982 Long term (current) use of aspirin: Secondary | ICD-10-CM | POA: Insufficient documentation

## 2015-11-23 DIAGNOSIS — Z794 Long term (current) use of insulin: Secondary | ICD-10-CM | POA: Diagnosis not present

## 2015-11-23 DIAGNOSIS — J449 Chronic obstructive pulmonary disease, unspecified: Secondary | ICD-10-CM | POA: Diagnosis not present

## 2015-11-23 DIAGNOSIS — M1990 Unspecified osteoarthritis, unspecified site: Secondary | ICD-10-CM | POA: Insufficient documentation

## 2015-11-23 DIAGNOSIS — K2970 Gastritis, unspecified, without bleeding: Secondary | ICD-10-CM | POA: Diagnosis not present

## 2015-11-23 DIAGNOSIS — Z8673 Personal history of transient ischemic attack (TIA), and cerebral infarction without residual deficits: Secondary | ICD-10-CM | POA: Diagnosis not present

## 2015-11-23 DIAGNOSIS — E119 Type 2 diabetes mellitus without complications: Secondary | ICD-10-CM | POA: Insufficient documentation

## 2015-11-23 DIAGNOSIS — R1084 Generalized abdominal pain: Secondary | ICD-10-CM

## 2015-11-23 DIAGNOSIS — I10 Essential (primary) hypertension: Secondary | ICD-10-CM | POA: Insufficient documentation

## 2015-11-23 DIAGNOSIS — Z87891 Personal history of nicotine dependence: Secondary | ICD-10-CM | POA: Diagnosis not present

## 2015-11-23 LAB — URINALYSIS COMPLETE WITH MICROSCOPIC (ARMC ONLY)
BILIRUBIN URINE: NEGATIVE
Bacteria, UA: NONE SEEN
GLUCOSE, UA: 50 mg/dL — AB
HGB URINE DIPSTICK: NEGATIVE
Ketones, ur: NEGATIVE mg/dL
Leukocytes, UA: NEGATIVE
Nitrite: NEGATIVE
PH: 5 (ref 5.0–8.0)
Protein, ur: 100 mg/dL — AB
SPECIFIC GRAVITY, URINE: 1.028 (ref 1.005–1.030)

## 2015-11-23 LAB — COMPREHENSIVE METABOLIC PANEL
ALBUMIN: 3.4 g/dL — AB (ref 3.5–5.0)
ALK PHOS: 63 U/L (ref 38–126)
ALT: 12 U/L — AB (ref 17–63)
AST: 17 U/L (ref 15–41)
Anion gap: 8 (ref 5–15)
BUN: 28 mg/dL — AB (ref 6–20)
CALCIUM: 8.7 mg/dL — AB (ref 8.9–10.3)
CHLORIDE: 105 mmol/L (ref 101–111)
CO2: 23 mmol/L (ref 22–32)
CREATININE: 2.17 mg/dL — AB (ref 0.61–1.24)
GFR calc non Af Amer: 28 mL/min — ABNORMAL LOW (ref >60)
GFR, EST AFRICAN AMERICAN: 33 mL/min — AB (ref >60)
GLUCOSE: 201 mg/dL — AB (ref 65–99)
Potassium: 4 mmol/L (ref 3.5–5.1)
SODIUM: 136 mmol/L (ref 135–145)
Total Bilirubin: 0.3 mg/dL (ref 0.3–1.2)
Total Protein: 6.4 g/dL — ABNORMAL LOW (ref 6.5–8.1)

## 2015-11-23 LAB — CBC
HCT: 35.9 % — ABNORMAL LOW (ref 40.0–52.0)
Hemoglobin: 12.8 g/dL — ABNORMAL LOW (ref 13.0–18.0)
MCH: 32 pg (ref 26.0–34.0)
MCHC: 35.7 g/dL (ref 32.0–36.0)
MCV: 89.8 fL (ref 80.0–100.0)
PLATELETS: 203 10*3/uL (ref 150–440)
RBC: 4 MIL/uL — ABNORMAL LOW (ref 4.40–5.90)
RDW: 13.9 % (ref 11.5–14.5)
WBC: 8.1 10*3/uL (ref 3.8–10.6)

## 2015-11-23 LAB — LIPASE, BLOOD: Lipase: 26 U/L (ref 11–51)

## 2015-11-23 MED ORDER — FAMOTIDINE IN NACL 20-0.9 MG/50ML-% IV SOLN
20.0000 mg | Freq: Once | INTRAVENOUS | Status: AC
  Administered 2015-11-23: 20 mg via INTRAVENOUS
  Filled 2015-11-23: qty 50

## 2015-11-23 MED ORDER — SUCRALFATE 1 G PO TABS: 1.0000 g | ORAL_TABLET | Freq: Four times a day (QID) | ORAL | Status: DC

## 2015-11-23 MED ORDER — DIATRIZOATE MEGLUMINE & SODIUM 66-10 % PO SOLN
15.0000 mL | ORAL | Status: AC
  Administered 2015-11-23: 30 mL via ORAL

## 2015-11-23 MED ORDER — LIDOCAINE VISCOUS 2 % MT SOLN
15.0000 mL | Freq: Once | OROMUCOSAL | Status: AC
  Administered 2015-11-23: 15 mL via OROMUCOSAL
  Filled 2015-11-23: qty 15

## 2015-11-23 MED ORDER — FAMOTIDINE 20 MG PO TABS: 20.0000 mg | ORAL_TABLET | Freq: Two times a day (BID) | ORAL | Status: DC

## 2015-11-23 MED ORDER — ALUM & MAG HYDROXIDE-SIMETH 200-200-20 MG/5ML PO SUSP
30.0000 mL | Freq: Once | ORAL | Status: AC
  Administered 2015-11-23: 30 mL via ORAL
  Filled 2015-11-23: qty 30

## 2015-11-23 NOTE — ED Provider Notes (Signed)
[facility] Emergency Department Provider Note  ____________________________________________  Time seen: 3:30 PM  I have reviewed the triage vital signs and the nursing notes.   HISTORY  Chief Complaint Abdominal Pain    HPI Phillip Reyes is a 75 y.o. male who complains of generalized abdominal pain, specifically in the periumbilical area. This started about a week ago and has been mostly constant though waxing waning. He also reports he's been having nausea vomiting and diarrhea. He has been able to eat and drink although he's had somewhat decreased oral intake. He was recently hospitalized at the [place] where he underwent cardiac evaluation including cardiac catheterization. He brings a discharge summary with him which shows hospital course a medication list and confirms that his workup was negative. He has stopped taking Plavix according to their instructions and is awaiting scheduling for an upper endoscopy with gastroenterology for further evaluation of his symptoms.  Patient reports he last had a colonoscopy 1 year ago worked for polyps were excised. Pathology on this was benign according to the patient. No rectal bleeding, no history of GI bleeding.   Past Medical History  Diagnosis Date  . Diabetes mellitus without complication ([place])   . Cancer ([place])   . COPD (chronic obstructive pulmonary disease) ([place])   . Hypertension   . Renal disorder   . Stroke ([place])   . Arthritis      Patient Active Problem List   Diagnosis Date Noted  . Acute encephalopathy 06/11/2015  . Type 2 diabetes mellitus ([place]) 06/11/2015  . HTN (hypertension) 06/11/2015  . COPD (chronic obstructive pulmonary disease) ([place]) 06/11/2015  . CKD (chronic kidney disease), stage III 06/11/2015  . H/O: stroke 06/11/2015     Past Surgical History  Procedure Laterality Date  . Appendectomy    . Throat surgery       Current Outpatient Rx  Name  Route  Sig  Dispense  Refill  .  amitriptyline (ELAVIL) 25 MG tablet   Oral   Take 25 mg by mouth at bedtime.         Marland Kitchen amLODipine (NORVASC) 5 MG tablet   Oral   Take 5 mg by mouth daily.         Marland Kitchen aspirin EC 81 MG tablet   Oral   Take 81 mg by mouth daily.         Marland Kitchen atorvastatin (LIPITOR) 80 MG tablet   Oral   Take 80 mg by mouth at bedtime.         . cholecalciferol (VITAMIN D) 1000 units tablet   Oral   Take 2,000 Units by mouth daily.         . citalopram (CELEXA) 10 MG tablet   Oral   Take 10 mg by mouth daily.         . clopidogrel (PLAVIX) 75 MG tablet   Oral   Take 75 mg by mouth daily.         Marland Kitchen doxazosin (CARDURA) 8 MG tablet   Oral   Take 8 mg by mouth at bedtime.         . famotidine (PEPCID) 20 MG tablet   Oral   Take 1 tablet (20 mg total) by mouth 2 (two) times daily.   60 tablet   0   . furosemide (LASIX) 40 MG tablet   Oral   Take 40 mg by mouth.         . gabapentin (NEURONTIN) 400 MG capsule  Oral   Take 400 mg by mouth 3 (three) times daily.         . hydrocerin (EUCERIN) CREA   Topical   Apply 1 application topically 2 (two) times daily.         . insulin glargine (LANTUS) 100 UNIT/ML injection   Subcutaneous   Inject 40-55 Units into the skin See admin instructions. Inject 40 units at bedtime and 55 units in the morning with breakfast         . insulin lispro (HUMALOG) 100 UNIT/ML injection   Subcutaneous   Inject 8 Units into the skin 3 (three) times daily with meals.         . Ipratropium-Albuterol (COMBIVENT RESPIMAT) 20-100 MCG/ACT AERS respimat   Inhalation   Inhale 1 puff into the lungs every 6 (six) hours as needed for wheezing or shortness of breath.         Marland Kitchen ipratropium-albuterol (DUONEB) 0.5-2.5 (3) MG/3ML SOLN   Nebulization   Take 3 mLs by nebulization every 6 (six) hours as needed.         . loratadine (CLARITIN) 10 MG tablet   Oral   Take 10 mg by mouth daily.         . ondansetron (ZOFRAN) 8 MG tablet    Oral   Take 4 mg by mouth every 8 (eight) hours as needed for nausea or vomiting.         . potassium chloride (K-DUR,KLOR-CON) 10 MEQ tablet   Oral   Take 10 mEq by mouth daily.         . sennosides-docusate sodium (SENOKOT-S) 8.6-50 MG tablet   Oral   Take 1 tablet by mouth daily.         . sucralfate (CARAFATE) 1 g tablet   Oral   Take 1 tablet (1 g total) by mouth 4 (four) times daily.   120 tablet   1   . vitamin B-12 (CYANOCOBALAMIN) 1000 MCG tablet   Oral   Take 1,000 mcg by mouth daily.            Allergies Norco   Family History  Problem Relation Age of Onset  . Congestive Heart Failure Father     Social History Social History  Substance Use Topics  . Smoking status: Former Smoker    Quit date: 10/31/2015  . Smokeless tobacco: None  . Alcohol Use: No    Review of Systems  Constitutional:   No fever or chills.  ENT:   No sore throat. No rhinorrhea. Cardiovascular:   No chest pain. Respiratory:   No dyspnea or cough. Gastrointestinal:   Positive for generalized abdominal pain with vomiting and diarrhea.  Genitourinary:   Negative for dysuria or difficulty urinating. Musculoskeletal:   Negative for focal pain or swelling Neurological:   Negative for headaches 10-point ROS otherwise negative.  ____________________________________________   PHYSICAL EXAM:  VITAL SIGNS: ED Triage Vitals  Enc Vitals Group     BP 11/23/15 1523 140/62 mmHg     Pulse Rate 11/23/15 1525 61     Resp 11/23/15 1525 16     Temp 11/23/15 1527 97.2 F (36.2 C)     Temp src --      SpO2 11/23/15 1525 98 %     Weight --      Height --      Head Cir --      Peak Flow --      Pain Score 11/23/15 1521 9  Pain Loc --      Pain Edu? --      Excl. in [place]? --     Vital signs reviewed, nursing assessments reviewed.   Constitutional:   Alert and oriented. No distress. Eyes:   No scleral icterus. No conjunctival pallor. PERRL. EOMI.  No nystagmus. ENT    Head:   Normocephalic and atraumatic.   Nose:   No congestion/rhinnorhea. No septal hematoma   Mouth/Throat:   Dry mucous membranes, no pharyngeal erythema. No peritonsillar mass.    Neck:   No stridor. No SubQ emphysema. No meningismus. Hematological/Lymphatic/Immunilogical:   No cervical lymphadenopathy. Cardiovascular:   RRR. Symmetric bilateral radial and DP pulses.  No murmurs.  Respiratory:   Normal respiratory effort without tachypnea nor retractions. Breath sounds are clear and equal bilaterally. No wheezes/rales/rhonchi. Gastrointestinal:   Soft with generalized tenderness. Nondistended. No hernia mass.. There is no CVA tenderness.  No rebound, rigidity, or guarding. Rectal exam shows brown stool, trace Hemoccult-positive Genitourinary:   deferred Musculoskeletal:   Nontender with normal range of motion in all extremities. No joint effusions.  No lower extremity tenderness.  No edema. Neurologic:   Normal speech and language.  CN 2-10 normal. Motor with relative weakness on the left which is his chronic baseline. No acute findings. No gross focal neurologic deficits are appreciated.  Skin:    Skin is warm, dry and intact. No rash noted.  No petechiae, purpura, or bullae.  ____________________________________________    LABS (pertinent positives/negatives) (all labs ordered are listed, but only abnormal results are displayed) Labs Reviewed  COMPREHENSIVE METABOLIC PANEL - Abnormal; Notable for the following:    Glucose, Bld 201 (*)    BUN 28 (*)    Creatinine, Ser 2.17 (*)    Calcium 8.7 (*)    Total Protein 6.4 (*)    Albumin 3.4 (*)    ALT 12 (*)    GFR calc non Af Amer 28 (*)    GFR calc Af Amer 33 (*)    All other components within normal limits  CBC - Abnormal; Notable for the following:    RBC 4.00 (*)    Hemoglobin 12.8 (*)    HCT 35.9 (*)    All other components within normal limits  URINALYSIS COMPLETEWITH MICROSCOPIC ([facility] ONLY) - Abnormal; Notable  for the following:    Color, Urine YELLOW (*)    APPearance CLEAR (*)    Glucose, UA 50 (*)    Protein, ur 100 (*)    Squamous Epithelial / LPF 0-5 (*)    All other components within normal limits  LIPASE, BLOOD   ____________________________________________   EKG    ____________________________________________    RADIOLOGY  CT scan abdomen pelvis unremarkable. Cholelithiasis without evidence of cholecystitis or other acute biliary disease.  ____________________________________________   PROCEDURES   ____________________________________________   INITIAL IMPRESSION / ASSESSMENT AND PLAN / ED COURSE  Pertinent labs & imaging results that were available during my care of the patient were reviewed by me and considered in my medical decision making (see chart for details).  Patient well appearing not in distress, vital signs unremarkable, presents with generalized abdominal pain nausea vomiting diarrhea. Given IV fluids, antiemetics, antacids. Labs unremarkable, we'll get a CT scan to further evaluate.    ----------------------------------------- 7:53 PM on 11/23/2015 -----------------------------------------  Patient feeling better after GI cocktail. Vital signs remained stable. Workup unremarkable. We'll discharge home to follow up with his GI doctor when scheduled. Abdomen Carafate and Pepcid to  his current PPI therapy.Considering the patient's symptoms, medical history, and physical examination today, I have low suspicion for cholecystitis or biliary pathology, pancreatitis, perforation or bowel obstruction, hernia, intra-abdominal abscess, AAA or dissection, volvulus or intussusception, mesenteric ischemia, or appendicitis.     ____________________________________________   FINAL CLINICAL IMPRESSION(S) / ED DIAGNOSES  Final diagnoses:  Generalized abdominal pain  Gastritis       Portions of this note were generated with dragon dictation software.  Dictation errors may occur despite best attempts at proofreading.   Carrie Mew, MD 11/23/15 [phone]

## 2015-11-23 NOTE — ED Notes (Signed)
CT called, pt finished contrast at this time.

## 2015-11-23 NOTE — Discharge Instructions (Signed)
Your blood tests, urine test, and CT scan of the abdomen today were unremarkable.  We were unable to find an exact cause of your symptoms but it is likely due to your stomach issues.  Add Carafate and Famotidine (pepcid) to your current medications until you can follow up with the gastroenterologist.  Abdominal Pain, Adult Many things can cause abdominal pain. Usually, abdominal pain is not caused by a disease and will improve without treatment. It can often be observed and treated at home. Your health care provider will do a physical exam and possibly order blood tests and X-rays to help determine the seriousness of your pain. However, in many cases, more time must pass before a clear cause of the pain can be found. Before that point, your health care provider may not know if you need more testing or further treatment. HOME CARE INSTRUCTIONS Monitor your abdominal pain for any changes. The following actions may help to alleviate any discomfort you are experiencing:  Only take over-the-counter or prescription medicines as directed by your health care provider.  Do not take laxatives unless directed to do so by your health care provider.  Try a clear liquid diet (broth, tea, or water) as directed by your health care provider. Slowly move to a bland diet as tolerated. SEEK MEDICAL CARE IF:  You have unexplained abdominal pain.  You have abdominal pain associated with nausea or diarrhea.  You have pain when you urinate or have a bowel movement.  You experience abdominal pain that wakes you in the night.  You have abdominal pain that is worsened or improved by eating food.  You have abdominal pain that is worsened with eating fatty foods.  You have a fever. SEEK IMMEDIATE MEDICAL CARE IF:  Your pain does not go away within 2 hours.  You keep throwing up (vomiting).  Your pain is felt only in portions of the abdomen, such as the right side or the left lower portion of the abdomen.  You  pass bloody or black tarry stools. MAKE SURE YOU:  Understand these instructions.  Will watch your condition.  Will get help right away if you are not doing well or get worse.   This information is not intended to replace advice given to you by your health care provider. Make sure you discuss any questions you have with your health care provider.   Document Released: 02/01/2005 Document Revised: 01/13/2015 Document Reviewed: 01/01/2013 Elsevier Interactive Patient Education 2016 Elsevier Inc.  Gastritis, Adult Gastritis is soreness and swelling (inflammation) of the lining of the stomach. Gastritis can develop as a sudden onset (acute) or long-term (chronic) condition. If gastritis is not treated, it can lead to stomach bleeding and ulcers. CAUSES  Gastritis occurs when the stomach lining is weak or damaged. Digestive juices from the stomach then inflame the weakened stomach lining. The stomach lining may be weak or damaged due to viral or bacterial infections. One common bacterial infection is the Helicobacter pylori infection. Gastritis can also result from excessive alcohol consumption, taking certain medicines, or having too much acid in the stomach.  SYMPTOMS  In some cases, there are no symptoms. When symptoms are present, they may include:  Pain or a burning sensation in the upper abdomen.  Nausea.  Vomiting.  An uncomfortable feeling of fullness after eating. DIAGNOSIS  Your caregiver may suspect you have gastritis based on your symptoms and a physical exam. To determine the cause of your gastritis, your caregiver may perform the following:  Blood or stool tests to check for the H pylori bacterium.  Gastroscopy. A thin, flexible tube (endoscope) is passed down the esophagus and into the stomach. The endoscope has a light and camera on the end. Your caregiver uses the endoscope to view the inside of the stomach.  Taking a tissue sample (biopsy) from the stomach to examine  under a microscope. TREATMENT  Depending on the cause of your gastritis, medicines may be prescribed. If you have a bacterial infection, such as an H pylori infection, antibiotics may be given. If your gastritis is caused by too much acid in the stomach, H2 blockers or antacids may be given. Your caregiver may recommend that you stop taking aspirin, ibuprofen, or other nonsteroidal anti-inflammatory drugs (NSAIDs). HOME CARE INSTRUCTIONS  Only take over-the-counter or prescription medicines as directed by your caregiver.  If you were given antibiotic medicines, take them as directed. Finish them even if you start to feel better.  Drink enough fluids to keep your urine clear or pale yellow.  Avoid foods and drinks that make your symptoms worse, such as:  Caffeine or alcoholic drinks.  Chocolate.  Peppermint or mint flavorings.  Garlic and onions.  Spicy foods.  Citrus fruits, such as oranges, lemons, or limes.  Tomato-based foods such as sauce, chili, salsa, and pizza.  Fried and fatty foods.  Eat small, frequent meals instead of large meals. SEEK IMMEDIATE MEDICAL CARE IF:   You have black or dark red stools.  You vomit blood or material that looks like coffee grounds.  You are unable to keep fluids down.  Your abdominal pain gets worse.  You have a fever.  You do not feel better after 1 week.  You have any other questions or concerns. MAKE SURE YOU:  Understand these instructions.  Will watch your condition.  Will get help right away if you are not doing well or get worse.   This information is not intended to replace advice given to you by your health care provider. Make sure you discuss any questions you have with your health care provider.   Document Released: 04/18/2001 Document Revised: 10/24/2011 Document Reviewed: 06/07/2011 Elsevier Interactive Patient Education [employer].

## 2015-11-23 NOTE — ED Notes (Signed)
Pt placed in lobby at this time awaiting for ride to come. First nurse made aware and will assist pt to car if needed.

## 2015-11-23 NOTE — ED Notes (Signed)
MD stafford at bedside.

## 2015-11-23 NOTE — ED Notes (Signed)
Pt frm home via EMS, initally C/O chest pain and also R sided abd pain for the past week with N/V/D. 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

## 2015-11-23 NOTE — ED Notes (Signed)
Lab called, will start to process blood work at this time

## 2016-03-09 ENCOUNTER — Observation Stay: Payer: Medicare HMO

## 2016-03-09 ENCOUNTER — Observation Stay
Admission: EM | Admit: 2016-03-09 | Discharge: 2016-03-09 | Disposition: A | Discharge status: Other Acute Inpatient Hospital | Payer: Medicare HMO | Attending: Internal Medicine | Admitting: Internal Medicine

## 2016-03-09 ENCOUNTER — Emergency Department: Payer: Medicare HMO

## 2016-03-09 DIAGNOSIS — Z87891 Personal history of nicotine dependence: Secondary | ICD-10-CM | POA: Insufficient documentation

## 2016-03-09 DIAGNOSIS — R531 Weakness: Principal | ICD-10-CM | POA: Insufficient documentation

## 2016-03-09 DIAGNOSIS — W19XXXA Unspecified fall, initial encounter: Secondary | ICD-10-CM

## 2016-03-09 DIAGNOSIS — Z7982 Long term (current) use of aspirin: Secondary | ICD-10-CM | POA: Insufficient documentation

## 2016-03-09 DIAGNOSIS — N183 Chronic kidney disease, stage 3 (moderate): Secondary | ICD-10-CM | POA: Diagnosis not present

## 2016-03-09 DIAGNOSIS — I129 Hypertensive chronic kidney disease with stage 1 through stage 4 chronic kidney disease, or unspecified chronic kidney disease: Secondary | ICD-10-CM | POA: Diagnosis not present

## 2016-03-09 DIAGNOSIS — E1122 Type 2 diabetes mellitus with diabetic chronic kidney disease: Secondary | ICD-10-CM | POA: Insufficient documentation

## 2016-03-09 DIAGNOSIS — Z79899 Other long term (current) drug therapy: Secondary | ICD-10-CM | POA: Diagnosis not present

## 2016-03-09 DIAGNOSIS — J449 Chronic obstructive pulmonary disease, unspecified: Secondary | ICD-10-CM | POA: Insufficient documentation

## 2016-03-09 DIAGNOSIS — Z794 Long term (current) use of insulin: Secondary | ICD-10-CM | POA: Insufficient documentation

## 2016-03-09 DIAGNOSIS — Z7902 Long term (current) use of antithrombotics/antiplatelets: Secondary | ICD-10-CM | POA: Insufficient documentation

## 2016-03-09 DIAGNOSIS — M1990 Unspecified osteoarthritis, unspecified site: Secondary | ICD-10-CM | POA: Diagnosis not present

## 2016-03-09 DIAGNOSIS — R4182 Altered mental status, unspecified: Secondary | ICD-10-CM | POA: Diagnosis present

## 2016-03-09 DIAGNOSIS — Z8673 Personal history of transient ischemic attack (TIA), and cerebral infarction without residual deficits: Secondary | ICD-10-CM | POA: Insufficient documentation

## 2016-03-09 LAB — COMPREHENSIVE METABOLIC PANEL
ALBUMIN: 3.6 g/dL (ref 3.5–5.0)
ALT: 22 U/L (ref 17–63)
AST: 25 U/L (ref 15–41)
Alkaline Phosphatase: 61 U/L (ref 38–126)
Anion gap: 7 (ref 5–15)
BUN: 26 mg/dL — AB (ref 6–20)
CHLORIDE: 108 mmol/L (ref 101–111)
CO2: 23 mmol/L (ref 22–32)
Calcium: 8.5 mg/dL — ABNORMAL LOW (ref 8.9–10.3)
Creatinine, Ser: 1.84 mg/dL — ABNORMAL HIGH (ref 0.61–1.24)
GFR calc Af Amer: 40 mL/min — ABNORMAL LOW (ref >60)
GFR calc non Af Amer: 34 mL/min — ABNORMAL LOW (ref >60)
GLUCOSE: 200 mg/dL — AB (ref 65–99)
POTASSIUM: 4.4 mmol/L (ref 3.5–5.1)
Sodium: 138 mmol/L (ref 135–145)
Total Bilirubin: 0.5 mg/dL (ref 0.3–1.2)
Total Protein: 6.3 g/dL — ABNORMAL LOW (ref 6.5–8.1)

## 2016-03-09 LAB — URINE DRUG SCREEN, QUALITATIVE (ARMC ONLY)
Amphetamines, Ur Screen: NOT DETECTED
BARBITURATES, UR SCREEN: NOT DETECTED
BENZODIAZEPINE, UR SCRN: NOT DETECTED
COCAINE METABOLITE, UR ~~LOC~~: NOT DETECTED
Cannabinoid 50 Ng, Ur ~~LOC~~: NOT DETECTED
MDMA (Ecstasy)Ur Screen: NOT DETECTED
Methadone Scn, Ur: NOT DETECTED
OPIATE, UR SCREEN: NOT DETECTED
PHENCYCLIDINE (PCP) UR S: NOT DETECTED
Tricyclic, Ur Screen: NOT DETECTED

## 2016-03-09 LAB — CBC WITH DIFFERENTIAL/PLATELET
BASOS PCT: 1 %
Basophils Absolute: 0.1 10*3/uL (ref 0–0.1)
EOS ABS: 0.1 10*3/uL (ref 0–0.7)
EOS PCT: 1 %
HEMATOCRIT: 33.4 % — AB (ref 40.0–52.0)
Hemoglobin: 11.6 g/dL — ABNORMAL LOW (ref 13.0–18.0)
Lymphocytes Relative: 9 %
Lymphs Abs: 1.1 10*3/uL (ref 1.0–3.6)
MCH: 31.4 pg (ref 26.0–34.0)
MCHC: 34.7 g/dL (ref 32.0–36.0)
MCV: 90.4 fL (ref 80.0–100.0)
MONO ABS: 1.1 10*3/uL — AB (ref 0.2–1.0)
MONOS PCT: 9 %
NEUTROS ABS: 9.5 10*3/uL — AB (ref 1.4–6.5)
Neutrophils Relative %: 80 %
PLATELETS: 198 10*3/uL (ref 150–440)
RBC: 3.7 MIL/uL — ABNORMAL LOW (ref 4.40–5.90)
RDW: 13.8 % (ref 11.5–14.5)
WBC: 12 10*3/uL — ABNORMAL HIGH (ref 3.8–10.6)

## 2016-03-09 LAB — BLOOD GAS, ARTERIAL
ACID-BASE DEFICIT: 1.3 mmol/L (ref 0.0–2.0)
Bicarbonate: 22.7 mmol/L (ref 20.0–28.0)
FIO2: 0.21
O2 SAT: 95.7 %
PATIENT TEMPERATURE: 37
PH ART: 7.42 (ref 7.350–7.450)
pCO2 arterial: 35 mmHg (ref 32.0–48.0)
pO2, Arterial: 78 mmHg — ABNORMAL LOW (ref 83.0–108.0)

## 2016-03-09 LAB — URINALYSIS COMPLETE WITH MICROSCOPIC (ARMC ONLY)
BACTERIA UA: NONE SEEN
BILIRUBIN URINE: NEGATIVE
Glucose, UA: 150 mg/dL — AB
Hgb urine dipstick: NEGATIVE
Ketones, ur: NEGATIVE mg/dL
LEUKOCYTES UA: NEGATIVE
Nitrite: NEGATIVE
PH: 7 (ref 5.0–8.0)
PROTEIN: 100 mg/dL — AB
RBC / HPF: NONE SEEN RBC/hpf (ref 0–5)
SQUAMOUS EPITHELIAL / LPF: NONE SEEN
Specific Gravity, Urine: 1.016 (ref 1.005–1.030)

## 2016-03-09 LAB — CK: CK TOTAL: 55 U/L (ref 49–397)

## 2016-03-09 LAB — GLUCOSE, CAPILLARY: GLUCOSE-CAPILLARY: 209 mg/dL — AB (ref 65–99)

## 2016-03-09 LAB — AMMONIA: Ammonia: 26 umol/L (ref 9–35)

## 2016-03-09 LAB — PROTIME-INR
INR: 1
PROTHROMBIN TIME: 13.2 s (ref 11.4–15.2)

## 2016-03-09 LAB — BRAIN NATRIURETIC PEPTIDE: B Natriuretic Peptide: 120 pg/mL — ABNORMAL HIGH (ref 0.0–100.0)

## 2016-03-09 LAB — TROPONIN I: Troponin I: <0.03 ng/mL (ref <0.03)

## 2016-03-09 LAB — ETHANOL

## 2016-03-09 MED ORDER — ONDANSETRON HCL 4 MG PO TABS
4.0000 mg | ORAL_TABLET | Freq: Four times a day (QID) | ORAL | Status: DC | PRN
Start: 2016-03-09 — End: 2016-03-09

## 2016-03-09 MED ORDER — IPRATROPIUM-ALBUTEROL 20-100 MCG/ACT IN AERS: 1.0000 | INHALATION_SPRAY | Freq: Four times a day (QID) | RESPIRATORY_TRACT | Status: DC | PRN

## 2016-03-09 MED ORDER — CITALOPRAM HYDROBROMIDE 20 MG PO TABS: 10.0000 mg | ORAL_TABLET | Freq: Every day | ORAL | Status: DC

## 2016-03-09 MED ORDER — CLOPIDOGREL BISULFATE 75 MG PO TABS: 75.0000 mg | ORAL_TABLET | Freq: Every day | ORAL | Status: DC

## 2016-03-09 MED ORDER — METHYLPREDNISOLONE SODIUM SUCC 125 MG IJ SOLR: 60.0000 mg | INTRAMUSCULAR | Status: DC

## 2016-03-09 MED ORDER — FAMOTIDINE 20 MG PO TABS: 20.0000 mg | ORAL_TABLET | Freq: Two times a day (BID) | ORAL | Status: DC

## 2016-03-09 MED ORDER — DOXAZOSIN MESYLATE 8 MG PO TABS: 8.0000 mg | ORAL_TABLET | Freq: Every day | ORAL | Status: DC

## 2016-03-09 MED ORDER — VITAMIN B-12 1000 MCG PO TABS: 1000.0000 ug | ORAL_TABLET | Freq: Every day | ORAL | Status: DC

## 2016-03-09 MED ORDER — ASPIRIN EC 81 MG PO TBEC: 81.0000 mg | DELAYED_RELEASE_TABLET | Freq: Every day | ORAL | Status: DC

## 2016-03-09 MED ORDER — TRAZODONE HCL 50 MG PO TABS: 25.0000 mg | ORAL_TABLET | Freq: Every evening | ORAL | Status: DC | PRN

## 2016-03-09 MED ORDER — SUCRALFATE 1 G PO TABS: 1.0000 g | ORAL_TABLET | Freq: Four times a day (QID) | ORAL | Status: DC

## 2016-03-09 MED ORDER — HEPARIN SODIUM (PORCINE) 5000 UNIT/ML IJ SOLN: 5000.0000 [IU] | Freq: Three times a day (TID) | INTRAMUSCULAR | Status: DC

## 2016-03-09 MED ORDER — IPRATROPIUM-ALBUTEROL 0.5-2.5 (3) MG/3ML IN SOLN: 3.0000 mL | Freq: Four times a day (QID) | RESPIRATORY_TRACT | Status: DC | PRN

## 2016-03-09 MED ORDER — DOCUSATE SODIUM 100 MG PO CAPS: 100.0000 mg | ORAL_CAPSULE | Freq: Two times a day (BID) | ORAL | Status: DC

## 2016-03-09 MED ORDER — BISACODYL 5 MG PO TBEC: 5.0000 mg | DELAYED_RELEASE_TABLET | Freq: Every day | ORAL | Status: DC | PRN

## 2016-03-09 MED ORDER — SODIUM CHLORIDE 0.9 % IV BOLUS (SEPSIS)
1000.0000 mL | Freq: Once | INTRAVENOUS | Status: AC
  Administered 2016-03-09: 1000 mL via INTRAVENOUS

## 2016-03-09 MED ORDER — SODIUM CHLORIDE 0.9 % IV SOLN: INTRAVENOUS | Status: DC

## 2016-03-09 MED ORDER — GABAPENTIN 300 MG PO CAPS: 400.0000 mg | ORAL_CAPSULE | Freq: Three times a day (TID) | ORAL | Status: DC

## 2016-03-09 MED ORDER — ONDANSETRON HCL 4 MG/2ML IJ SOLN: 4.0000 mg | Freq: Four times a day (QID) | INTRAMUSCULAR | Status: DC | PRN

## 2016-03-09 MED ORDER — AMLODIPINE BESYLATE 5 MG PO TABS: 5.0000 mg | ORAL_TABLET | Freq: Every day | ORAL | Status: DC

## 2016-03-09 NOTE — ED Notes (Signed)
Report give Doctor, hospital at [facility]

## 2016-03-09 NOTE — ED Notes (Signed)
CBG with EMS 189 

## 2016-03-09 NOTE — Care Management Obs Status (Signed)
[place] NOTIFICATION   Patient Details  Name: Jahmar Mckelvy MRN: [id number] Date of Birth: 04/01/1941   Medicare Observation Status Notification Given:  Yes Signed by caregiver at bedside.    Beau Fanny, RN 03/09/2016, 11:49 AM

## 2016-03-09 NOTE — Care Management Note (Signed)
Case Management Note  Patient Details  Name: Phillip Reyes MRN: [id number] Date of Birth: 03-23-1941  Subjective/Objective:    Pt. Normally seen at [facility] in [place]. Referral is in process. Will await dispo.                Action/Plan:   Expected Discharge Date:                  Expected Discharge Plan:     In-House Referral:     Discharge planning Services     Post Acute Care Choice:    Choice offered to:     DME Arranged:    DME Agency:     HH Arranged:    HH Agency:     Status of Service:     If discussed at [employer] of Stay Meetings, dates discussed:    Additional Comments:  Beau Fanny, RN 03/09/2016, 11:49 AM

## 2016-03-09 NOTE — ED Notes (Signed)
Pt unable to provide urine sample with urinal.

## 2016-03-09 NOTE — Progress Notes (Addendum)
H&P is  not done because patient is from  [facility] is in   Process. Radiologist called  And mentioned that pt has acute left pontine stroke, Got bed offer at [facility].I will discontinue my admit orders,.

## 2016-03-09 NOTE — ED Notes (Signed)
Pt in CT at this time.

## 2016-03-09 NOTE — ED Notes (Signed)
Pt arrives to ER via EMS after unwitnessed fall this AM. Pt denies pain. Pt alert and oriented X4 but slow to answer questions. Pt appears tired, fatigued. Caregiver this AM left and had not fallen at 7AM. So fall between 7AM and time of EMS being called.

## 2016-03-09 NOTE — ED Notes (Signed)
RT at bedside for ABG

## 2016-03-09 NOTE — ED Provider Notes (Addendum)
[facility] Emergency Department Provider Note  ____________________________________________   I have reviewed the triage vital signs and the nursing notes.   HISTORY  Chief Complaint Fall and Weakness    HPI Shey Bartmess is a 75 y.o. male with a history of prior CVA, COPD, hypertension and renal disorder, who lives at home. He has been a little unsteady on his feet according to family over the last couple days, and last night he was having some wheeze. This is not unusual for him. He was seen normal during the night a few times when he went up to go to the bathroom, and he was in bed asleep at 7:30, at 8:00 someone else came by the house and found him on the floor. He himself does not recall how he got there. No evidence of trauma was noted. Patient did not bite his tongue or any was not incontinent of bowel or bladder. No known seizure history. The patient denies any pain or injury. He is very slow to respond however. When he does respond he is oriented.  He himself cannot give any further history, Level 5 chart caveat; no further history available due to patient status.  Past Medical History:  Diagnosis Date  . Arthritis   . Cancer ([place])   . COPD (chronic obstructive pulmonary disease) ([place])   . Diabetes mellitus without complication ([place])   . Hypertension   . Renal disorder   . Stroke [facility])     Patient Active Problem List   Diagnosis Date Noted  . Acute encephalopathy 06/11/2015  . Type 2 diabetes mellitus ([place]) 06/11/2015  . HTN (hypertension) 06/11/2015  . COPD (chronic obstructive pulmonary disease) ([place]) 06/11/2015  . CKD (chronic kidney disease), stage III 06/11/2015  . H/O: stroke 06/11/2015    Past Surgical History:  Procedure Laterality Date  . APPENDECTOMY    . THROAT SURGERY      Prior to Admission medications   Medication Sig Start Date End Date Taking? Authorizing Provider  amitriptyline (ELAVIL) 25 MG tablet Take 25 mg by mouth  at bedtime.    Historical Provider, MD  amLODipine (NORVASC) 5 MG tablet Take 5 mg by mouth daily.    Historical Provider, MD  aspirin EC 81 MG tablet Take 81 mg by mouth daily.    Historical Provider, MD  atorvastatin (LIPITOR) 80 MG tablet Take 80 mg by mouth at bedtime.    Historical Provider, MD  cholecalciferol (VITAMIN D) 1000 units tablet Take 2,000 Units by mouth daily.    Historical Provider, MD  citalopram (CELEXA) 10 MG tablet Take 10 mg by mouth daily.    Historical Provider, MD  clopidogrel (PLAVIX) 75 MG tablet Take 75 mg by mouth daily.    Historical Provider, MD  doxazosin (CARDURA) 8 MG tablet Take 8 mg by mouth at bedtime.    Historical Provider, MD  famotidine (PEPCID) 20 MG tablet Take 1 tablet (20 mg total) by mouth 2 (two) times daily. 11/23/15   Carrie Mew, MD  furosemide (LASIX) 40 MG tablet Take 40 mg by mouth.    Historical Provider, MD  gabapentin (NEURONTIN) 400 MG capsule Take 400 mg by mouth 3 (three) times daily.    Historical Provider, MD  hydrocerin (EUCERIN) CREA Apply 1 application topically 2 (two) times daily.    Historical Provider, MD  insulin glargine (LANTUS) 100 UNIT/ML injection Inject 40-55 Units into the skin See admin instructions. Inject 40 units at bedtime and 55 units in the morning  with breakfast    Historical Provider, MD  insulin lispro (HUMALOG) 100 UNIT/ML injection Inject 8 Units into the skin 3 (three) times daily with meals.    Historical Provider, MD  Ipratropium-Albuterol (COMBIVENT RESPIMAT) 20-100 MCG/ACT AERS respimat Inhale 1 puff into the lungs every 6 (six) hours as needed for wheezing or shortness of breath.    Historical Provider, MD  ipratropium-albuterol (DUONEB) 0.5-2.5 (3) MG/3ML SOLN Take 3 mLs by nebulization every 6 (six) hours as needed.    Historical Provider, MD  loratadine (CLARITIN) 10 MG tablet Take 10 mg by mouth daily.    Historical Provider, MD  ondansetron (ZOFRAN) 8 MG tablet Take 4 mg by mouth every 8 (eight)  hours as needed for nausea or vomiting.    Historical Provider, MD  potassium chloride (K-DUR,KLOR-CON) 10 MEQ tablet Take 10 mEq by mouth daily.    Historical Provider, MD  sennosides-docusate sodium (SENOKOT-S) 8.6-50 MG tablet Take 1 tablet by mouth daily.    Historical Provider, MD  sucralfate (CARAFATE) 1 g tablet Take 1 tablet (1 g total) by mouth 4 (four) times daily. 11/23/15   Carrie Mew, MD  vitamin B-12 (CYANOCOBALAMIN) 1000 MCG tablet Take 1,000 mcg by mouth daily.    Historical Provider, MD    Allergies Norco [hydrocodone-acetaminophen]  Family History  Problem Relation Age of Onset  . Congestive Heart Failure Father     Social History Social History  Substance Use Topics  . Smoking status: Former Smoker    Quit date: 10/31/2015  . Smokeless tobacco: Not on file  . Alcohol use No    Review of Systems Level 5 chart caveat; no further history available due to patient status.  ____________________________________________   PHYSICAL EXAM:  VITAL SIGNS: ED Triage Vitals  Enc Vitals Group     BP 03/09/16 0856 (!) 178/91     Pulse Rate 03/09/16 0856 (!) 59     Resp 03/09/16 0856 (!) 22     Temp 03/09/16 0856 98.4 F (36.9 C)     Temp Source 03/09/16 0856 Oral     SpO2 03/09/16 0856 95 %     Weight 03/09/16 0857 196 lb 13.9 oz (89.3 kg)     Height --      Head Circumference --      Peak Flow --      Pain Score --      Pain Loc --      Pain Edu? --      Excl. in [place]? --     Constitutional: Patient is very somnolent but arouses to verbal stimuli, he is in no acute distress, he knows where he is at and he knows his name and the year. He will follow commands and a limited manner. Eyes: Conjunctivae are normal. PERRL. EOMI. Head: Atraumatic. Nose: No congestion/rhinnorhea. Mouth/Throat: Mucous membranes are moist.  Oropharynx non-erythematous. Neck: No stridor.   Nontender with no meningismus Cardiovascular: Normal rate, regular rhythm. Grossly normal  heart sounds.  Good peripheral circulation. Respiratory: Normal respiratory effort.  No retractions. Lungs CTAB. Abdominal: Soft and nontender. No distention. No guarding no rebound Back:  There is no focal tenderness or step off.  there is no midline tenderness there are no lesions noted. there is no CVA tenderness Musculoskeletal: No lower extremity tenderness, no upper extremity tenderness. No joint effusions, no DVT signs strong distal pulses no edema Neurologic: Very difficult limited neurologic exam. Patient will wiggle his toes for me, he will not however participate in  the neurologic exam which is a very difficult for me to determine if there is any focality to his exam. Is also unclear from family what his baseline is. Patient does have weakness diffusely with a bilateral grip strength is appears equal but again quite limited. Skin:  Skin is warm, dry and intact. No rash noted. Psychiatric: Mood and affect are normal. Speech and behavior are normal.  ____________________________________________   LABS (all labs ordered are listed, but only abnormal results are displayed)  Labs Reviewed  BRAIN NATRIURETIC PEPTIDE - Abnormal; Notable for the following:       Result Value   B Natriuretic Peptide 120.0 (*)    All other components within normal limits  CBC WITH DIFFERENTIAL/PLATELET - Abnormal; Notable for the following:    WBC 12.0 (*)    RBC 3.70 (*)    Hemoglobin 11.6 (*)    HCT 33.4 (*)    Neutro Abs 9.5 (*)    Monocytes Absolute 1.1 (*)    All other components within normal limits  URINALYSIS COMPLETEWITH MICROSCOPIC ([facility] ONLY) - Abnormal; Notable for the following:    Color, Urine YELLOW (*)    APPearance CLEAR (*)    Glucose, UA 150 (*)    Protein, ur 100 (*)    All other components within normal limits  COMPREHENSIVE METABOLIC PANEL - Abnormal; Notable for the following:    Glucose, Bld 200 (*)    BUN 26 (*)    Creatinine, Ser 1.84 (*)    Calcium 8.5 (*)    Total  Protein 6.3 (*)    GFR calc non Af Amer 34 (*)    GFR calc Af Amer 40 (*)    All other components within normal limits  BLOOD GAS, ARTERIAL - Abnormal; Notable for the following:    pO2, Arterial 78 (*)    All other components within normal limits  GLUCOSE, CAPILLARY - Abnormal; Notable for the following:    Glucose-Capillary 209 (*)    All other components within normal limits  ETHANOL  TROPONIN I  PROTIME-INR  CK  AMMONIA  CBG MONITORING, ED   ____________________________________________  EKG  I personally interpreted any EKGs ordered by me or triage EKG shows sinus rhythm rate 62 bpm no acute ischemia noted normal axis, some wandering artifact limits interpretation ____________________________________________  RADIOLOGY  I reviewed any imaging ordered by me or triage that were performed during my shift and, if possible, patient and/or family made aware of any abnormal findings. ____________________________________________   PROCEDURES  Procedure(s) performed: None  Procedures  Critical Care performed: None  ____________________________________________   INITIAL IMPRESSION / ASSESSMENT AND PLAN / ED COURSE  Pertinent labs & imaging results that were available during my care of the patient were reviewed by me and considered in my medical decision making (see chart for details).  Patient here with somnolence after being found on the floor. His somnolence persists here he is guarding his airway at this time. CT scan is negative blood work is negative no evidence of CO2 retention no evidence of alcohol intoxication no evidence of acute traumatic injury, no evidence of sepsis, no evidence of rhabdomyolysis. Difficult to evaluate for injury as he is not fully compliant with exam but no clear evidence of pelvic fracture or other injury. CT of the cervical spine was negative. Ammonia level is pending. Patient will require [place] for further  evaluation.  ----------------------------------------- 1:07 PM on 03/09/2016 -----------------------------------------  Patient would prefer to go to the [place] he  states. This is difficult to arrange but we are trying to do so. Patient has insurance that covers here we have offered admission here. Patient remains somnolent but easily arousable, he has had I think an improvement in his level of alertness. He remains nonfocal. Awaiting word back from the [place].  ----------------------------------------- 3:36 PM on 03/09/2016 -----------------------------------------  History remains in no acute distress, he is somewhat more alert. Dr. Rolla Flatten, at the [facility]  accepts patient ED to ED transferred. Signed out to Dr. Mariea Clonts at the end of my shift  Clinical Course   ____________________________________________   FINAL CLINICAL IMPRESSION(S) / ED DIAGNOSES  Final diagnoses:  None      This chart was dictated using voice recognition software.  Despite best efforts to proofread,  errors can occur which can change meaning.      Schuyler Amor, MD 03/09/16 1032    Schuyler Amor, MD 03/09/16 [place], MD 03/09/16 1536

## 2017-05-08 DIAGNOSIS — I639 Cerebral infarction, unspecified: Secondary | ICD-10-CM

## 2017-05-08 HISTORY — DX: Cerebral infarction, unspecified: I63.9

## 2017-09-28 ENCOUNTER — Other Ambulatory Visit: Payer: Self-pay

## 2017-09-28 ENCOUNTER — Ambulatory Visit
Admission: EM | Admit: 2017-09-28 | Discharge: 2017-09-28 | Disposition: A | Discharge status: Other Acute Inpatient Hospital | Payer: Medicare HMO | Attending: Family Medicine | Admitting: Family Medicine

## 2017-09-28 DIAGNOSIS — R55 Syncope and collapse: Secondary | ICD-10-CM | POA: Diagnosis not present

## 2017-09-28 DIAGNOSIS — R5383 Other fatigue: Secondary | ICD-10-CM

## 2017-09-28 NOTE — ED Provider Notes (Signed)
[facility]    CSN: [id number] Arrival date & time: 09/28/17  1133  History   Chief Complaint Chief Complaint  Patient presents with  . Loss of Consciousness  . Weakness   HPI  77 year old male with a comp gated past medical history including COPD, DM 2, stroke, hypertension, CKD, hypertension presents for evaluation of syncope.  History is provided by the caregiver as well as patient.  Caregiver is the best friend of his daughter.  He states that he was called to patient's house last night after he had passed out after going to the bathroom.  Per the patient, he went to the restroom and had a coughing fit.  He subsequently woke up on the floor in the hallway.  The caregiver states that he examined the patient there is no evidence of acute head trauma.  He seemed to be doing okay.  No other reported issues last night.  Per the patient there were no preceding symptoms other than his coughing.  This morning he is not his normal self.  He seems to be slow to respond.  He did eat breakfast normally.  Patient states that he just feels fatigued and has a lack of energy.  No reports of chest pain or shortness of breath.  Patient just does not feel well.  Caregiver agrees that he is not his normal self.  No other reported symptoms.  No other complaints.  Past Medical History:  Diagnosis Date  . Arthritis   . Cancer ([place])   . COPD (chronic obstructive pulmonary disease) ([place])   . Diabetes mellitus without complication ([place])   . Hypertension   . Renal disorder   . Stroke [facility])    Patient Active Problem List   Diagnosis Date Noted  . Altered mental status 03/09/2016  . Acute encephalopathy 06/11/2015  . Type 2 diabetes mellitus ([place]) 06/11/2015  . HTN (hypertension) 06/11/2015  . COPD (chronic obstructive pulmonary disease) ([place]) 06/11/2015  . CKD (chronic kidney disease), stage III ([place]) 06/11/2015  . H/O: stroke 06/11/2015   Past Surgical History:  Procedure Laterality  Date  . APPENDECTOMY    . THROAT SURGERY     Home Medications    Prior to Admission medications   Medication Sig Start Date End Date Taking? Authorizing Provider  acetaminophen (TYLENOL) 325 MG tablet Take 650 mg by mouth every 6 (six) hours as needed.    [provider]  amLODipine (NORVASC) 10 MG tablet Take 10 mg by mouth daily.     [provider]  aspirin EC 81 MG tablet Take 81 mg by mouth daily.    [provider]  atorvastatin (LIPITOR) 80 MG tablet Take 80 mg by mouth at bedtime.    [provider]  cholecalciferol (VITAMIN D) 1000 units tablet Take 2,000 Units by mouth daily.    [provider]  citalopram (CELEXA) 40 MG tablet Take 20 mg by mouth daily.     [provider]  clopidogrel (PLAVIX) 75 MG tablet Take 75 mg by mouth daily.    [provider]  doxazosin (CARDURA) 4 MG tablet Take 4 mg by mouth at bedtime.     [provider]  famotidine (PEPCID) 20 MG tablet Take 1 tablet (20 mg total) by mouth 2 (two) times daily. Patient not taking: Reported on 03/09/2016 11/23/15   Carrie Mew, MD  gabapentin (NEURONTIN) 400 MG capsule Take 400 mg by mouth daily.     [provider]  insulin  glargine (LANTUS) 100 UNIT/ML injection Inject 55 Units into the skin at bedtime.     [provider]  insulin lispro (HUMALOG) 100 UNIT/ML injection Inject 12 Units into the skin 3 (three) times daily with meals. Use sliding scale to increase amount of insulin needed based on blood sugar.    [provider]  Ipratropium-Albuterol (COMBIVENT RESPIMAT) 20-100 MCG/ACT AERS respimat Inhale 1 puff into the lungs every 6 (six) hours as needed for wheezing or shortness of breath.    [provider]  ipratropium-albuterol (DUONEB) 0.5-2.5 (3) MG/3ML SOLN Take 3 mLs by nebulization every 6 (six) hours as needed.    [provider]  lisinopril (PRINIVIL,ZESTRIL) 5 MG tablet Take 2.5 mg by  mouth daily.    [provider]  Melatonin 3 MG TABS Take 3 mg by mouth daily.    [provider]  omeprazole (PRILOSEC) 20 MG capsule Take 20 mg by mouth daily.    [provider]  oxyCODONE (OXY IR/ROXICODONE) 5 MG immediate release tablet Take 5 mg by mouth every 6 (six) hours as needed for severe pain.    [provider]  sucralfate (CARAFATE) 1 g tablet Take 1 tablet (1 g total) by mouth 4 (four) times daily. Patient not taking: Reported on 03/09/2016 11/23/15   Carrie Mew, MD  vitamin B-12 (CYANOCOBALAMIN) 1000 MCG tablet Take 1,000 mcg by mouth daily.    [provider]    Family History Family History  Problem Relation Age of Onset  . Congestive Heart Failure Father     Social History Social History   Tobacco Use  . Smoking status: Former Smoker    Last attempt to quit: 10/31/2015    Years since quitting: 1.9  . Smokeless tobacco: Never Used  Substance Use Topics  . Alcohol use: No  . Drug use: No     Allergies   Norco [hydrocodone-acetaminophen]   Review of Systems Review of Systems  Constitutional: Positive for fatigue.  Neurological: Positive for syncope and weakness.   Physical Exam Triage Vital Signs ED Triage Vitals  Enc Vitals Group     BP 09/28/17 1146 (!) 160/57     Pulse Rate 09/28/17 1146 (!) 56     Resp 09/28/17 1146 16     Temp 09/28/17 1146 (!) 97.5 F (36.4 C)     Temp Source 09/28/17 1146 Oral     SpO2 09/28/17 1146 97 %     Weight 09/28/17 1147 179 lb (81.2 kg)     Height 09/28/17 1147 5\' 8"  (1.727 m)     Head Circumference --      Peak Flow --      Pain Score 09/28/17 1147 0     Pain Loc --      Pain Edu? --      Excl. in [place]? --    Updated Vital Signs BP (!) 160/57 (BP Location: Left Arm)   Pulse (!) 56   Temp (!) 97.5 F (36.4 C) (Oral)   Resp 16   Ht 5\' 8"  (1.727 m)   Wt 179 lb (81.2 kg)   SpO2 97%   BMI 27.22 kg/m     Physical Exam  Constitutional: He appears  well-developed.  Appears very fatigued but in no apparent distress.  HENT:  Head: Normocephalic and atraumatic.  Nose: Nose normal.  Mouth/Throat: Oropharynx is clear and moist.  Eyes: Conjunctivae are normal. Right eye exhibits no discharge. Left eye exhibits no discharge.  Cardiovascular:  Bradycardic. 2/6 systolic murmur.  Pulmonary/Chest: Effort normal and breath sounds normal. He has no wheezes. He has no rales.  Neurological: He is alert.  Alert but slow to respond.  Nursing note and vitals reviewed.  UC Treatments / Results  Labs (all labs ordered are listed, but only abnormal results are displayed) Labs Reviewed - No data to display  EKG None  Radiology No results found.  Procedures Procedures (including critical care time)  Medications Ordered in UC Medications - No data to display  Initial Impression / Assessment and Plan / UC Course  I have reviewed the triage vital signs and the nursing notes.  Pertinent labs & imaging results that were available during my care of the patient were reviewed by me and considered in my medical decision making (see chart for details).    77 year old male with multiple risk factors presents for evaluation after suffering a syncopal episode last night.  I had a lengthy discussion with the caregiver as well as the patient.  I recommend that he go to the hospital for complete evaluation with labs and imaging as well as monitoring.  I consider him to be at least intermediate and possibly high risk.  He may benefit from hospital admission for observation.  Final Clinical Impressions(s) / UC Diagnoses   Final diagnoses:  Syncope, unspecified syncope type     Discharge Instructions     Take him urgently to the [place].  Take care  Dr. Lacinda Axon    ED Prescriptions    None     Controlled Substance Prescriptions [place] Controlled Substance Registry consulted? Not Applicable   Coral Spikes, DO 09/28/17 1328

## 2017-09-28 NOTE — ED Triage Notes (Signed)
Pt is here with a family friend that helps care for him, states he went to routine appts at the [place] yesterday and last night the pt states around 2am that he went to the [facility] and had a coughing fit and had a syncopal episode and woke up in the hallway. States today he just feels tired and wornout. Pt is a/ox4 on arrival,

## 2017-09-28 NOTE — Discharge Instructions (Signed)
Take him urgently to the [place].  Take care  Dr. Lacinda Axon

## 2018-01-01 ENCOUNTER — Emergency Department
Admission: EM | Admit: 2018-01-01 | Discharge: 2018-01-01 | Disposition: A | Discharge status: Home / Self Care | Payer: Medicare HMO | Attending: Emergency Medicine | Admitting: Emergency Medicine

## 2018-01-01 ENCOUNTER — Other Ambulatory Visit: Payer: Self-pay

## 2018-01-01 DIAGNOSIS — Z794 Long term (current) use of insulin: Secondary | ICD-10-CM | POA: Diagnosis not present

## 2018-01-01 DIAGNOSIS — E1122 Type 2 diabetes mellitus with diabetic chronic kidney disease: Secondary | ICD-10-CM | POA: Insufficient documentation

## 2018-01-01 DIAGNOSIS — Z79899 Other long term (current) drug therapy: Secondary | ICD-10-CM | POA: Diagnosis not present

## 2018-01-01 DIAGNOSIS — F17210 Nicotine dependence, cigarettes, uncomplicated: Secondary | ICD-10-CM | POA: Diagnosis not present

## 2018-01-01 DIAGNOSIS — Z7982 Long term (current) use of aspirin: Secondary | ICD-10-CM | POA: Diagnosis not present

## 2018-01-01 DIAGNOSIS — Z7902 Long term (current) use of antithrombotics/antiplatelets: Secondary | ICD-10-CM | POA: Insufficient documentation

## 2018-01-01 DIAGNOSIS — I129 Hypertensive chronic kidney disease with stage 1 through stage 4 chronic kidney disease, or unspecified chronic kidney disease: Secondary | ICD-10-CM | POA: Diagnosis present

## 2018-01-01 DIAGNOSIS — N183 Chronic kidney disease, stage 3 (moderate): Secondary | ICD-10-CM | POA: Diagnosis not present

## 2018-01-01 DIAGNOSIS — I10 Essential (primary) hypertension: Secondary | ICD-10-CM

## 2018-01-01 NOTE — ED Provider Notes (Signed)
[facility] Emergency Department Provider Note   ____________________________________________    I have reviewed the triage vital signs and the nursing notes.   HISTORY  Chief Complaint Hypertension     HPI Phillip Reyes is a 77 y.o. male who presents with complaints of high blood pressure.  Patient reports he saw his PCP at the [facility] yesterday and she adjusted his blood pressure medication.  He reports he took one dose last night but when he checked his blood pressure this morning it was elevated so he became concerned and came to the emergency department.  He reports he has had multiple strokes in the past.  Denies new weakness, headache, chest pain, shortness of breath.  Overall feels well   Past Medical History:  Diagnosis Date  . Arthritis   . Cancer ([place])   . COPD (chronic obstructive pulmonary disease) ([place])   . Diabetes mellitus without complication ([place])   . Hypertension   . Renal disorder   . Stroke [facility])     Patient Active Problem List   Diagnosis Date Noted  . Altered mental status 03/09/2016  . Acute encephalopathy 06/11/2015  . Type 2 diabetes mellitus ([place]) 06/11/2015  . HTN (hypertension) 06/11/2015  . COPD (chronic obstructive pulmonary disease) ([place]) 06/11/2015  . CKD (chronic kidney disease), stage III ([place]) 06/11/2015  . H/O: stroke 06/11/2015    Past Surgical History:  Procedure Laterality Date  . APPENDECTOMY    . THROAT SURGERY      Prior to Admission medications   Medication Sig Start Date End Date Taking? Authorizing Provider  acetaminophen (TYLENOL) 325 MG tablet Take 650 mg by mouth every 6 (six) hours as needed.    [provider]  amLODipine (NORVASC) 10 MG tablet Take 10 mg by mouth daily.     [provider]  aspirin EC 81 MG tablet Take 81 mg by mouth daily.    [provider]  atorvastatin (LIPITOR) 80 MG tablet Take 80 mg by mouth at bedtime.    [provider]    cholecalciferol (VITAMIN D) 1000 units tablet Take 2,000 Units by mouth daily.    [provider]  citalopram (CELEXA) 40 MG tablet Take 20 mg by mouth daily.     [provider]  clopidogrel (PLAVIX) 75 MG tablet Take 75 mg by mouth daily.    [provider]  doxazosin (CARDURA) 4 MG tablet Take 4 mg by mouth at bedtime.     [provider]  famotidine (PEPCID) 20 MG tablet Take 1 tablet (20 mg total) by mouth 2 (two) times daily. Patient not taking: Reported on 03/09/2016 11/23/15   Carrie Mew, MD  gabapentin (NEURONTIN) 400 MG capsule Take 400 mg by mouth daily.     [provider]  insulin glargine (LANTUS) 100 UNIT/ML injection Inject 55 Units into the skin at bedtime.     [provider]  insulin lispro (HUMALOG) 100 UNIT/ML injection Inject 12 Units into the skin 3 (three) times daily with meals. Use sliding scale to increase amount of insulin needed based on blood sugar.    [provider]  Ipratropium-Albuterol (COMBIVENT RESPIMAT) 20-100 MCG/ACT AERS respimat Inhale 1 puff into the lungs every 6 (six) hours as needed for wheezing or shortness of breath.    [provider]  ipratropium-albuterol (DUONEB) 0.5-2.5 (3) MG/3ML SOLN Take 3 mLs by nebulization every 6 (six) hours as needed.    [provider]  lisinopril (PRINIVIL,ZESTRIL)  5 MG tablet Take 2.5 mg by mouth daily.    [provider]  Melatonin 3 MG TABS Take 3 mg by mouth daily.    [provider]  omeprazole (PRILOSEC) 20 MG capsule Take 20 mg by mouth daily.    [provider]  oxyCODONE (OXY IR/ROXICODONE) 5 MG immediate release tablet Take 5 mg by mouth every 6 (six) hours as needed for severe pain.    [provider]  sucralfate (CARAFATE) 1 g tablet Take 1 tablet (1 g total) by mouth 4 (four) times daily. Patient not taking: Reported on 03/09/2016 11/23/15   Carrie Mew, MD  vitamin B-12  (CYANOCOBALAMIN) 1000 MCG tablet Take 1,000 mcg by mouth daily.    [provider]     Allergies Norco [hydrocodone-acetaminophen]  Family History  Problem Relation Age of Onset  . Congestive Heart Failure Father     Social History Social History   Tobacco Use  . Smoking status: Current Every Day Smoker    Packs/day: 0.50    Years: 61.00    Pack years: 30.50    Types: Cigarettes  . Smokeless tobacco: Never Used  Substance Use Topics  . Alcohol use: No  . Drug use: No    Review of Systems  Constitutional: No fever/chills Eyes: No visual changes.  ENT: No neck pain Cardiovascular: Denies chest pain. Respiratory: Denies shortness of breath. Gastrointestinal: No abdominal pain. Genitourinary: Negative for dysuria. Musculoskeletal: Negative for back pain. Skin: Negative for rash. Neurological: Negative for headaches   ____________________________________________   PHYSICAL EXAM:  VITAL SIGNS: ED Triage Vitals  Enc Vitals Group     BP 01/01/18 1409 (!) 186/47     Pulse Rate 01/01/18 1409 (!) 52     Resp 01/01/18 1409 (!) 21     Temp 01/01/18 1409 98.2 F (36.8 C)     Temp Source 01/01/18 1409 Oral     SpO2 01/01/18 1409 99 %     Weight 01/01/18 1410 80.5 kg (177 lb 6.4 oz)     Height 01/01/18 1410 1.727 m (5\' 8" )     Head Circumference --      Peak Flow --      Pain Score 01/01/18 1409 0     Pain Loc --      Pain Edu? --      Excl. in [place]? --     Constitutional: Alert and oriented.  Eyes: Conjunctivae are normal.  PERRLA  Nose: No congestion/rhinnorhea. Mouth/Throat: Mucous membranes are moist.    Cardiovascular: Normal rate, regular rhythm. Grossly normal heart sounds.  Good peripheral circulation. Respiratory: Normal respiratory effort.  No retractions.  Gastrointestinal: Soft and nontender. No distention.    Musculoskeletal: No lower extremity tenderness nor edema.  Warm and well perfused Neurologic:  Normal speech and language. .  Mild  chronic weakness on the right, otherwise strength is normal Skin:  Skin is warm, dry and intact. No rash noted. Psychiatric: Mood and affect are normal. Speech and behavior are normal.  ____________________________________________   LABS (all labs ordered are listed, but only abnormal results are displayed)  Labs Reviewed - No data to display ____________________________________________  EKG  ED ECG REPORT I, Lavonia Drafts, the attending physician, personally viewed and interpreted this ECG.  Date: 01/01/2018  Rhythm: normal sinus rhythm QRS Axis: normal Intervals: normal ST/T Wave abnormalities: normal Narrative Interpretation: no evidence of acute ischemia  ____________________________________________  RADIOLOGY  None ____________________________________________   PROCEDURES  Procedure(s) performed: No  Procedures   Critical Care performed: No ____________________________________________   INITIAL IMPRESSION / ASSESSMENT AND PLAN / ED COURSE  Pertinent labs & imaging results that were available during my care of the patient were reviewed by me and considered in my medical decision making (see chart for details).  Patient well-appearing and in no acute distress.  Initial blood pressure was over 176 systolic but this improved on recheck without intervention.  I discussed with patient the need to continue taking blood pressure medications and that may take some time to see improvement.  He will follow-up with PCP for recheck and adjustment    ____________________________________________   FINAL CLINICAL IMPRESSION(S) / ED DIAGNOSES  Final diagnoses:  Essential hypertension        Note:  This document was prepared using Dragon voice recognition software and may include unintentional dictation errors.    Lavonia Drafts, MD 01/01/18 [phone]

## 2018-01-01 NOTE — ED Triage Notes (Signed)
Pt arrived via EMS from home c/o hypertension states that he has a hx of HTN and his doctor wanted him to begin taking hydralazine 3 times a day instead of once. Pt states that his blood pressure remains high.

## 2018-04-09 ENCOUNTER — Encounter: Payer: Self-pay | Admitting: Emergency Medicine

## 2018-04-09 ENCOUNTER — Ambulatory Visit
Admission: EM | Admit: 2018-04-09 | Discharge: 2018-04-09 | Disposition: A | Discharge status: Home / Self Care | Payer: No Typology Code available for payment source | Attending: Family Medicine | Admitting: Family Medicine

## 2018-04-09 ENCOUNTER — Ambulatory Visit (INDEPENDENT_AMBULATORY_CARE_PROVIDER_SITE_OTHER): Payer: No Typology Code available for payment source

## 2018-04-09 ENCOUNTER — Other Ambulatory Visit: Payer: Self-pay

## 2018-04-09 DIAGNOSIS — R5383 Other fatigue: Secondary | ICD-10-CM

## 2018-04-09 DIAGNOSIS — R638 Other symptoms and signs concerning food and fluid intake: Secondary | ICD-10-CM

## 2018-04-09 DIAGNOSIS — N179 Acute kidney failure, unspecified: Secondary | ICD-10-CM | POA: Diagnosis not present

## 2018-04-09 LAB — COMPREHENSIVE METABOLIC PANEL WITH GFR
ALT: 12 U/L (ref 0–44)
AST: 18 U/L (ref 15–41)
Albumin: 4.1 g/dL (ref 3.5–5.0)
Alkaline Phosphatase: 77 U/L (ref 38–126)
Anion gap: 10 (ref 5–15)
BUN: 22 mg/dL (ref 8–23)
CO2: 25 mmol/L (ref 22–32)
Calcium: 9.3 mg/dL (ref 8.9–10.3)
Chloride: 103 mmol/L (ref 98–111)
Creatinine, Ser: 2 mg/dL — ABNORMAL HIGH (ref 0.61–1.24)
GFR calc Af Amer: 36 mL/min — ABNORMAL LOW
GFR calc non Af Amer: 31 mL/min — ABNORMAL LOW
Glucose, Bld: 269 mg/dL — ABNORMAL HIGH (ref 70–99)
Potassium: 4.1 mmol/L (ref 3.5–5.1)
Sodium: 138 mmol/L (ref 135–145)
Total Bilirubin: 0.4 mg/dL (ref 0.3–1.2)
Total Protein: 7.4 g/dL (ref 6.5–8.1)

## 2018-04-09 LAB — CBC WITH DIFFERENTIAL/PLATELET
Abs Immature Granulocytes: 0.02 K/uL (ref 0.00–0.07)
Basophils Absolute: 0.1 K/uL (ref 0.0–0.1)
Basophils Relative: 1 %
Eosinophils Absolute: 0.2 K/uL (ref 0.0–0.5)
Eosinophils Relative: 3 %
HCT: 39.8 % (ref 39.0–52.0)
Hemoglobin: 13.5 g/dL (ref 13.0–17.0)
Immature Granulocytes: 0 %
Lymphocytes Relative: 19 %
Lymphs Abs: 1.4 K/uL (ref 0.7–4.0)
MCH: 30.3 pg (ref 26.0–34.0)
MCHC: 33.9 g/dL (ref 30.0–36.0)
MCV: 89.4 fL (ref 80.0–100.0)
Monocytes Absolute: 0.5 K/uL (ref 0.1–1.0)
Monocytes Relative: 7 %
Neutro Abs: 5.2 K/uL (ref 1.7–7.7)
Neutrophils Relative %: 70 %
Platelets: 270 K/uL (ref 150–400)
RBC: 4.45 MIL/uL (ref 4.22–5.81)
RDW: 13.4 % (ref 11.5–15.5)
WBC: 7.3 K/uL (ref 4.0–10.5)
nRBC: 0 % (ref 0.0–0.2)

## 2018-04-09 MED ORDER — SODIUM CHLORIDE 0.9 % IV BOLUS
1000.0000 mL | Freq: Once | INTRAVENOUS | Status: AC
  Administered 2018-04-09: 1000 mL via INTRAVENOUS

## 2018-04-09 NOTE — Discharge Instructions (Signed)
Lots of fluids.  If he fails to improve or worsens, please take him to the ER.  Take care  Dr. Lacinda Axon

## 2018-04-09 NOTE — ED Triage Notes (Signed)
Patient in today with his caregiver who states patient has been lethargic x 3 days. Caregiver denies fever. Patient has not been eating like normal although did eat better this morning at breakfast.

## 2018-04-09 NOTE — ED Provider Notes (Signed)
[facility]    CSN: [id number] Arrival date & time: 04/09/18  1242  History   Chief Complaint Chief Complaint  Patient presents with  . Fatigue   HPI  77 year old male presents with his caregiver for evaluation of lethargy.  Caregiver is the primary historian.  Patient gives some information but not a plethora of information.  Caregiver states that he has had an ongoing cold for the past 1.5 weeks.  Has had cough.  He has been doing okay but for the past few days has been lethargic.  Not eating his normal amount.  Seems fatigued.  Patient endorses fatigue.  States that he does not feel well.  Denies pain.  No fever.  No chills. No other reported symptoms.  No other complaints.  PMH, Surgical Hx, Family Hx, Social History reviewed and updated as below.  Past Medical History:  Diagnosis Date  . Arthritis   . Cancer ([place])   . COPD (chronic obstructive pulmonary disease) ([place])   . Diabetes mellitus without complication ([place])   . Hypertension   . Renal disorder   . Stroke [facility])    Patient Active Problem List   Diagnosis Date Noted  . Altered mental status 03/09/2016  . Acute encephalopathy 06/11/2015  . Type 2 diabetes mellitus ([place]) 06/11/2015  . HTN (hypertension) 06/11/2015  . COPD (chronic obstructive pulmonary disease) ([place]) 06/11/2015  . CKD (chronic kidney disease), stage III ([place]) 06/11/2015  . H/O: stroke 06/11/2015   Past Surgical History:  Procedure Laterality Date  . APPENDECTOMY    . THROAT SURGERY     Home Medications    Prior to Admission medications   Medication Sig Start Date End Date Taking? Authorizing Provider  acetaminophen (TYLENOL) 325 MG tablet Take 650 mg by mouth every 6 (six) hours as needed.   Yes [provider]  albuterol (PROVENTIL) (2.5 MG/3ML) 0.083% nebulizer solution Take 2.5 mg by nebulization every 6 (six) hours as needed for wheezing or shortness of breath.   Yes [provider]  amLODipine (NORVASC) 10  MG tablet Take 10 mg by mouth daily.    Yes [provider]  aspirin EC 81 MG tablet Take 81 mg by mouth daily.   Yes [provider]  atorvastatin (LIPITOR) 80 MG tablet Take 80 mg by mouth at bedtime.   Yes [provider]  cholecalciferol (VITAMIN D) 1000 units tablet Take 2,000 Units by mouth daily.   Yes [provider]  citalopram (CELEXA) 40 MG tablet Take 20 mg by mouth daily.    Yes [provider]  doxazosin (CARDURA) 4 MG tablet Take 4 mg by mouth at bedtime.    Yes [provider]  gabapentin (NEURONTIN) 400 MG capsule Take 400 mg by mouth daily.    Yes [provider]  insulin glargine (LANTUS) 100 UNIT/ML injection Inject 55 Units into the skin at bedtime.    Yes [provider]  insulin lispro (HUMALOG) 100 UNIT/ML injection Inject 12 Units into the skin 3 (three) times daily with meals. Use sliding scale to increase amount of insulin needed based on blood sugar.   Yes [provider]  Ipratropium-Albuterol (COMBIVENT RESPIMAT) 20-100 MCG/ACT AERS respimat Inhale 1 puff into the lungs every 6 (six) hours as needed for wheezing or shortness of breath.   Yes [provider]  ipratropium-albuterol (DUONEB) 0.5-2.5 (3) MG/3ML SOLN Take 3 mLs by nebulization every 6 (six) hours as needed.   Yes [provider]  lisinopril (  PRINIVIL,ZESTRIL) 5 MG tablet Take 2.5 mg by mouth daily.   Yes [provider]  sucralfate (CARAFATE) 1 g tablet Take 1 tablet (1 g total) by mouth 4 (four) times daily. 11/23/15  Yes Carrie Mew, MD  vitamin B-12 (CYANOCOBALAMIN) 1000 MCG tablet Take 1,000 mcg by mouth daily.   Yes [provider]  omeprazole (PRILOSEC) 20 MG capsule Take 20 mg by mouth daily.    [provider]    Family History Family History  Problem Relation Age of Onset  . Congestive Heart Failure Father   . Other Mother        unknown medical history    Social  History Social History   Tobacco Use  . Smoking status: Current Every Day Smoker    Packs/day: 0.50    Years: 61.00    Pack years: 30.50    Types: Cigarettes  . Smokeless tobacco: Never Used  Substance Use Topics  . Alcohol use: No  . Drug use: No     Allergies   Norco [hydrocodone-acetaminophen]   Review of Systems Review of Systems  Constitutional: Positive for appetite change and fever.  Respiratory: Positive for cough.      Physical Exam Triage Vital Signs ED Triage Vitals  Enc Vitals Group     BP 04/09/18 1258 (!) 119/56     Pulse Rate 04/09/18 1258 (!) 55     Resp 04/09/18 1258 16     Temp 04/09/18 1258 97.9 F (36.6 C)     Temp Source 04/09/18 1258 Oral     SpO2 04/09/18 1258 99 %     Weight 04/09/18 1259 169 lb (76.7 kg)     Height 04/09/18 1259 5\' 8"  (1.727 m)     Head Circumference --      Peak Flow --      Pain Score 04/09/18 1259 0     Pain Loc --      Pain Edu? --      Excl. in [place]? --    Updated Vital Signs BP (!) 119/56 (BP Location: Left Arm)   Pulse (!) 55   Temp 97.9 F (36.6 C) (Oral)   Resp 16   Ht 5\' 8"  (1.727 m)   Wt 76.7 kg   SpO2 99%   BMI 25.70 kg/m   Visual Acuity Right Eye Distance:   Left Eye Distance:   Bilateral Distance:    Right Eye Near:   Left Eye Near:    Bilateral Near:     Physical Exam  Constitutional: He appears well-developed.  Sitting in a wheelchair.  Hunched over.  Appears fatigued but in no acute distress.  HENT:  Head: Normocephalic and atraumatic.  Mouth/Throat: Oropharynx is clear and moist.  Eyes: Conjunctivae are normal. Right eye exhibits no discharge. Left eye exhibits no discharge.  Cardiovascular:  Regular rhythm.  Bradycardic.  Pulmonary/Chest: Effort normal and breath sounds normal. He has no wheezes. He has no rales.  Abdominal: Soft. He exhibits no distension. There is no tenderness.  Neurological:  Alert but slow to answer questions.  Psychiatric: His behavior is normal.  Flat  affect.  Nursing note and vitals reviewed.  UC Treatments / Results  Labs (all labs ordered are listed, but only abnormal results are displayed) Labs Reviewed  COMPREHENSIVE METABOLIC PANEL - Abnormal; Notable for the following components:      Result Value   Glucose, Bld 269 (*)    Creatinine, Ser 2.00 (*)    GFR calc  non Af Amer 31 (*)    GFR calc Af Amer 36 (*)    All other components within normal limits  CBC WITH DIFFERENTIAL/PLATELET    EKG None  Radiology Dg Chest 2 View  Result Date: 04/09/2018 CLINICAL DATA:  Lethargy. EXAM: CHEST - 2 VIEW COMPARISON:  03/09/2016 and prior exams FINDINGS: The cardiomediastinal silhouette is unremarkable. There is no evidence of focal airspace disease, pulmonary edema, suspicious pulmonary nodule/mass, pleural effusion, or pneumothorax. No acute bony abnormalities are identified. IMPRESSION: No active cardiopulmonary disease. Electronically Signed   By: Margarette Canada M.D.   On: 04/09/2018 14:08    Procedures Procedures (including critical care time)  Medications Ordered in UC Medications  sodium chloride 0.9 % bolus 1,000 mL (0 mLs Intravenous Stopped 04/09/18 1445)    Initial Impression / Assessment and Plan / UC Course  I have reviewed the triage vital signs and the nursing notes.  Pertinent labs & imaging results that were available during my care of the patient were reviewed by me and considered in my medical decision making (see chart for details).    77 year old male presents for evaluation of fatigue and lethargy.  Exam was unrevealing.  Laboratory studies revealed mild acute kidney injury.  Creatinine was 2.  Above his baseline of 1.5-1.6.  IV fluids given.  Stable at time of discharge.  Supportive care.  Final Clinical Impressions(s) / UC Diagnoses   Final diagnoses:  AKI (acute kidney injury) [facility])     Discharge Instructions     Lots of fluids.  If he fails to improve or worsens, please take him to the ER.  Take  care  Dr. Lacinda Axon    ED Prescriptions    None     Controlled Substance Prescriptions [place] Controlled Substance Registry consulted? Not Applicable   Coral Spikes, DO 04/09/18 2113

## 2018-10-17 DIAGNOSIS — R296 Repeated falls: Secondary | ICD-10-CM | POA: Insufficient documentation

## 2018-10-18 ENCOUNTER — Ambulatory Visit
Admission: EM | Admit: 2018-10-18 | Discharge: 2018-10-18 | Disposition: A | Discharge status: Other Acute Inpatient Hospital | Payer: Medicare HMO

## 2019-01-27 ENCOUNTER — Encounter: Payer: Self-pay | Admitting: Emergency Medicine

## 2019-01-27 ENCOUNTER — Emergency Department: Payer: Medicare HMO

## 2019-01-27 ENCOUNTER — Inpatient Hospital Stay
Admission: EM | Admit: 2019-01-27 | Discharge: 2019-01-29 | DRG: 193 | Disposition: A | Discharge status: Home Health | Payer: Medicare HMO | Attending: Specialist | Admitting: Specialist

## 2019-01-27 ENCOUNTER — Other Ambulatory Visit: Payer: Self-pay

## 2019-01-27 DIAGNOSIS — Z8249 Family history of ischemic heart disease and other diseases of the circulatory system: Secondary | ICD-10-CM

## 2019-01-27 DIAGNOSIS — Z66 Do not resuscitate: Secondary | ICD-10-CM | POA: Diagnosis present

## 2019-01-27 DIAGNOSIS — E860 Dehydration: Secondary | ICD-10-CM | POA: Diagnosis present

## 2019-01-27 DIAGNOSIS — Z79899 Other long term (current) drug therapy: Secondary | ICD-10-CM

## 2019-01-27 DIAGNOSIS — I129 Hypertensive chronic kidney disease with stage 1 through stage 4 chronic kidney disease, or unspecified chronic kidney disease: Secondary | ICD-10-CM | POA: Diagnosis present

## 2019-01-27 DIAGNOSIS — E1165 Type 2 diabetes mellitus with hyperglycemia: Secondary | ICD-10-CM | POA: Diagnosis present

## 2019-01-27 DIAGNOSIS — A084 Viral intestinal infection, unspecified: Secondary | ICD-10-CM | POA: Diagnosis present

## 2019-01-27 DIAGNOSIS — J440 Chronic obstructive pulmonary disease with acute lower respiratory infection: Secondary | ICD-10-CM | POA: Diagnosis present

## 2019-01-27 DIAGNOSIS — M1990 Unspecified osteoarthritis, unspecified site: Secondary | ICD-10-CM | POA: Diagnosis present

## 2019-01-27 DIAGNOSIS — K853 Drug induced acute pancreatitis without necrosis or infection: Secondary | ICD-10-CM | POA: Diagnosis present

## 2019-01-27 DIAGNOSIS — Z20828 Contact with and (suspected) exposure to other viral communicable diseases: Secondary | ICD-10-CM | POA: Diagnosis present

## 2019-01-27 DIAGNOSIS — Z7982 Long term (current) use of aspirin: Secondary | ICD-10-CM

## 2019-01-27 DIAGNOSIS — J189 Pneumonia, unspecified organism: Secondary | ICD-10-CM | POA: Diagnosis not present

## 2019-01-27 DIAGNOSIS — Z885 Allergy status to narcotic agent status: Secondary | ICD-10-CM | POA: Diagnosis not present

## 2019-01-27 DIAGNOSIS — F17210 Nicotine dependence, cigarettes, uncomplicated: Secondary | ICD-10-CM | POA: Diagnosis present

## 2019-01-27 DIAGNOSIS — Z794 Long term (current) use of insulin: Secondary | ICD-10-CM | POA: Diagnosis not present

## 2019-01-27 DIAGNOSIS — F329 Major depressive disorder, single episode, unspecified: Secondary | ICD-10-CM | POA: Diagnosis present

## 2019-01-27 DIAGNOSIS — E785 Hyperlipidemia, unspecified: Secondary | ICD-10-CM | POA: Diagnosis present

## 2019-01-27 DIAGNOSIS — E1122 Type 2 diabetes mellitus with diabetic chronic kidney disease: Secondary | ICD-10-CM | POA: Diagnosis present

## 2019-01-27 DIAGNOSIS — T50905A Adverse effect of unspecified drugs, medicaments and biological substances, initial encounter: Secondary | ICD-10-CM | POA: Diagnosis present

## 2019-01-27 DIAGNOSIS — E119 Type 2 diabetes mellitus without complications: Secondary | ICD-10-CM

## 2019-01-27 DIAGNOSIS — Z7951 Long term (current) use of inhaled steroids: Secondary | ICD-10-CM

## 2019-01-27 DIAGNOSIS — N183 Chronic kidney disease, stage 3 (moderate): Secondary | ICD-10-CM | POA: Diagnosis present

## 2019-01-27 DIAGNOSIS — N179 Acute kidney failure, unspecified: Secondary | ICD-10-CM | POA: Diagnosis present

## 2019-01-27 DIAGNOSIS — R748 Abnormal levels of other serum enzymes: Secondary | ICD-10-CM | POA: Diagnosis present

## 2019-01-27 DIAGNOSIS — R0602 Shortness of breath: Secondary | ICD-10-CM | POA: Diagnosis present

## 2019-01-27 DIAGNOSIS — Z8673 Personal history of transient ischemic attack (TIA), and cerebral infarction without residual deficits: Secondary | ICD-10-CM | POA: Diagnosis not present

## 2019-01-27 LAB — TROPONIN I (HIGH SENSITIVITY)
Troponin I (High Sensitivity): 26 ng/L — ABNORMAL HIGH (ref <18)
Troponin I (High Sensitivity): 21 ng/L — ABNORMAL HIGH (ref <18)

## 2019-01-27 LAB — CBC WITH DIFFERENTIAL/PLATELET
Abs Immature Granulocytes: 0.03 10*3/uL (ref 0.00–0.07)
Basophils Absolute: 0 10*3/uL (ref 0.0–0.1)
Basophils Relative: 1 %
Eosinophils Absolute: 0.2 10*3/uL (ref 0.0–0.5)
Eosinophils Relative: 2 %
HCT: 36.4 % — ABNORMAL LOW (ref 39.0–52.0)
Hemoglobin: 12 g/dL — ABNORMAL LOW (ref 13.0–17.0)
Immature Granulocytes: 0 %
Lymphocytes Relative: 20 %
Lymphs Abs: 1.4 10*3/uL (ref 0.7–4.0)
MCH: 29.9 pg (ref 26.0–34.0)
MCHC: 33 g/dL (ref 30.0–36.0)
MCV: 90.8 fL (ref 80.0–100.0)
Monocytes Absolute: 0.7 10*3/uL (ref 0.1–1.0)
Monocytes Relative: 10 %
Neutro Abs: 4.6 10*3/uL (ref 1.7–7.7)
Neutrophils Relative %: 67 %
Platelets: 197 10*3/uL (ref 150–400)
RBC: 4.01 MIL/uL — ABNORMAL LOW (ref 4.22–5.81)
RDW: 14.6 % (ref 11.5–15.5)
WBC: 7 10*3/uL (ref 4.0–10.5)
nRBC: 0 % (ref 0.0–0.2)

## 2019-01-27 LAB — URINALYSIS, ROUTINE W REFLEX MICROSCOPIC
Bilirubin Urine: NEGATIVE
Glucose, UA: 50 mg/dL — AB
Hgb urine dipstick: NEGATIVE
Ketones, ur: NEGATIVE mg/dL
Leukocytes,Ua: NEGATIVE
Nitrite: NEGATIVE
Protein, ur: NEGATIVE mg/dL
Specific Gravity, Urine: 1.009 (ref 1.005–1.030)
pH: 7 (ref 5.0–8.0)

## 2019-01-27 LAB — MAGNESIUM: Magnesium: 2.1 mg/dL (ref 1.7–2.4)

## 2019-01-27 LAB — COMPREHENSIVE METABOLIC PANEL
ALT: 26 U/L (ref 0–44)
AST: 18 U/L (ref 15–41)
Albumin: 4.1 g/dL (ref 3.5–5.0)
Alkaline Phosphatase: 78 U/L (ref 38–126)
Anion gap: 10 (ref 5–15)
BUN: 33 mg/dL — ABNORMAL HIGH (ref 8–23)
CO2: 27 mmol/L (ref 22–32)
Calcium: 9.3 mg/dL (ref 8.9–10.3)
Chloride: 104 mmol/L (ref 98–111)
Creatinine, Ser: 2.21 mg/dL — ABNORMAL HIGH (ref 0.61–1.24)
GFR calc Af Amer: 32 mL/min — ABNORMAL LOW (ref >60)
GFR calc non Af Amer: 28 mL/min — ABNORMAL LOW (ref >60)
Glucose, Bld: 219 mg/dL — ABNORMAL HIGH (ref 70–99)
Potassium: 4.3 mmol/L (ref 3.5–5.1)
Sodium: 141 mmol/L (ref 135–145)
Total Bilirubin: 0.8 mg/dL (ref 0.3–1.2)
Total Protein: 7.1 g/dL (ref 6.5–8.1)

## 2019-01-27 LAB — LIPASE, BLOOD: Lipase: 83 U/L — ABNORMAL HIGH (ref 11–51)

## 2019-01-27 LAB — BETA-HYDROXYBUTYRIC ACID: Beta-Hydroxybutyric Acid: 0.06 mmol/L (ref 0.05–0.27)

## 2019-01-27 LAB — GLUCOSE, CAPILLARY: Glucose-Capillary: 113 mg/dL — ABNORMAL HIGH (ref 70–99)

## 2019-01-27 LAB — SARS CORONAVIRUS 2 BY RT PCR (HOSPITAL ORDER, PERFORMED IN ~~LOC~~ HOSPITAL LAB): SARS Coronavirus 2: NEGATIVE

## 2019-01-27 MED ORDER — INSULIN ASPART 100 UNIT/ML ~~LOC~~ SOLN: 0.0000 [IU] | Freq: Every day | SUBCUTANEOUS | Status: DC

## 2019-01-27 MED ORDER — SODIUM CHLORIDE 0.9 % IV SOLN
1.0000 g | Freq: Once | INTRAVENOUS | Status: AC
  Administered 2019-01-27: 1 g via INTRAVENOUS
  Filled 2019-01-27: qty 10

## 2019-01-27 MED ORDER — SODIUM CHLORIDE 0.9 % IV BOLUS
1000.0000 mL | Freq: Once | INTRAVENOUS | Status: AC
  Administered 2019-01-27: 1000 mL via INTRAVENOUS

## 2019-01-27 MED ORDER — AZITHROMYCIN 500 MG PO TABS
500.0000 mg | ORAL_TABLET | Freq: Once | ORAL | Status: AC
  Administered 2019-01-27: 18:00:00 500 mg via ORAL
  Filled 2019-01-27: qty 1

## 2019-01-27 NOTE — ED Notes (Signed)
CBG checked at 113. Pt provided with a diabetic sandwich tray at this time and water.

## 2019-01-27 NOTE — ED Notes (Addendum)
This Rn in pt's room 19 attempting to call Melissa Robsinson at [phone] to rpvide report. Redirected to voicemail a this time pt with cell phone on hand attempting to call daughter as well.

## 2019-01-27 NOTE — ED Notes (Signed)
This RN offered a gown to pt. Pt st "i'm ok right now".

## 2019-01-27 NOTE — ED Notes (Signed)
This Rn spoke with daughter information on file. This RN provided status update to daughter via pt's cell phone. This Rn addresses al questions and pt on cell phone talking to family.

## 2019-01-27 NOTE — ED Notes (Signed)
Care giver: Burman Nieves [phone]

## 2019-01-27 NOTE — ED Provider Notes (Signed)
[facility] Emergency Department Provider Note  ____________________________________________   First MD Initiated Contact with Patient 01/27/19 1433     (approximate)  I have reviewed the triage vital signs and the nursing notes.   HISTORY  Chief Complaint Weakness, Diarrhea, and Nausea    HPI Phillip Reyes is a 78 y.o. male with COPD, diabetes, hypertension, renal disorder who presents with care giver for weakness.  Patient has had 4 episodes of nonbloody diarrhea.  He then just feels overall weakness.  Patient did have a cough as well per report of caregiver.  His sugars were also elevated.  Weakness started today, constant, nothing makes better, nothing makes it worse.  Report is that patient's heart rates are normally in the 50s.    Past Medical History:  Diagnosis Date  . Arthritis   . Cancer ([place])   . COPD (chronic obstructive pulmonary disease) ([place])   . Diabetes mellitus without complication ([place])   . Hypertension   . Renal disorder   . Stroke [facility])     Patient Active Problem List   Diagnosis Date Noted  . Altered mental status 03/09/2016  . Acute encephalopathy 06/11/2015  . Type 2 diabetes mellitus ([place]) 06/11/2015  . HTN (hypertension) 06/11/2015  . COPD (chronic obstructive pulmonary disease) ([place]) 06/11/2015  . CKD (chronic kidney disease), stage III ([place]) 06/11/2015  . H/O: stroke 06/11/2015    Past Surgical History:  Procedure Laterality Date  . APPENDECTOMY    . THROAT SURGERY      Prior to Admission medications   Medication Sig Start Date End Date Taking? Authorizing Provider  acetaminophen (TYLENOL) 325 MG tablet Take 650 mg by mouth every 6 (six) hours as needed.    [provider]  albuterol (PROVENTIL) (2.5 MG/3ML) 0.083% nebulizer solution Take 2.5 mg by nebulization every 6 (six) hours as needed for wheezing or shortness of breath.    [provider]  amLODipine (NORVASC) 10 MG tablet Take 10 mg  by mouth daily.     [provider]  aspirin EC 81 MG tablet Take 81 mg by mouth daily.    [provider]  atorvastatin (LIPITOR) 80 MG tablet Take 80 mg by mouth at bedtime.    [provider]  cholecalciferol (VITAMIN D) 1000 units tablet Take 2,000 Units by mouth daily.    [provider]  citalopram (CELEXA) 40 MG tablet Take 20 mg by mouth daily.     [provider]  doxazosin (CARDURA) 4 MG tablet Take 4 mg by mouth at bedtime.     [provider]  gabapentin (NEURONTIN) 400 MG capsule Take 400 mg by mouth daily.     [provider]  insulin glargine (LANTUS) 100 UNIT/ML injection Inject 55 Units into the skin at bedtime.     [provider]  insulin lispro (HUMALOG) 100 UNIT/ML injection Inject 12 Units into the skin 3 (three) times daily with meals. Use sliding scale to increase amount of insulin needed based on blood sugar.    [provider]  Ipratropium-Albuterol (COMBIVENT RESPIMAT) 20-100 MCG/ACT AERS respimat Inhale 1 puff into the lungs every 6 (six) hours as needed for wheezing or shortness of breath.    [provider]  ipratropium-albuterol (DUONEB) 0.5-2.5 (3) MG/3ML SOLN Take 3 mLs by nebulization every 6 (six) hours as needed.    [provider]  lisinopril (PRINIVIL,ZESTRIL) 5 MG tablet Take 2.5 mg by mouth daily.    [provider]  omeprazole (PRILOSEC) 20 MG capsule Take 20 mg by mouth daily.    [provider]  sucralfate (CARAFATE) 1 g tablet Take 1 tablet (1 g total) by mouth 4 (four) times daily. 11/23/15   Carrie Mew, MD  vitamin B-12 (CYANOCOBALAMIN) 1000 MCG tablet Take 1,000 mcg by mouth daily.    [provider]    Allergies Norco [hydrocodone-acetaminophen]  Family History  Problem Relation Age of Onset  . Congestive Heart Failure Father   . Other Mother        unknown medical history    Social History Social History    Tobacco Use  . Smoking status: Current Every Day Smoker    Packs/day: 0.50    Years: 61.00    Pack years: 30.50    Types: Cigarettes  . Smokeless tobacco: Never Used  Substance Use Topics  . Alcohol use: No  . Drug use: No      Review of Systems Constitutional: No fever/chills, weakness Eyes: No visual changes. ENT: No sore throat. Cardiovascular: Denies chest pain. Respiratory: Denies shortness of breath.  Positive cough Gastrointestinal: No abdominal pain.  Positive nausea and diarrhea Genitourinary: Negative for dysuria. Musculoskeletal: Negative for back pain. Skin: Negative for rash. Neurological: Negative for headaches, focal weakness or numbness. All other ROS negative ____________________________________________   PHYSICAL EXAM:  VITAL SIGNS: Blood pressure (!) 156/63, pulse (!) 46, temperature (!) 97.4 F (36.3 C), temperature source Oral, resp. rate 18, height 5\' 8"  (1.727 m), weight 73.9 kg, SpO2 100 %.   Constitutional: Alert and oriented. Well appearing and in no acute distress. Eyes: Conjunctivae are normal. EOMI. Head: Atraumatic. Nose: No congestion/rhinnorhea. Mouth/Throat: Mucous membranes are moist.   Neck: No stridor. Trachea Midline. FROM Cardiovascular: Bradycardic, regular rhythm. Grossly normal heart sounds.  Good peripheral circulation. Respiratory: Normal respiratory effort.  No retractions. Lungs CTAB. Gastrointestinal: Soft and nontender. No distention. No abdominal bruits.  Musculoskeletal: No lower extremity tenderness nor edema.  No joint effusions. Neurologic:  Normal speech and language. No gross focal neurologic deficits are appreciated.  Skin:  Skin is warm, dry and intact. No rash noted. Psychiatric: Mood and affect are normal. Speech and behavior are normal. GU: Deferred   ____________________________________________   LABS (all labs ordered are listed, but only abnormal results are displayed)  Labs Reviewed  CBC WITH  DIFFERENTIAL/PLATELET - Abnormal; Notable for the following components:      Result Value   RBC 4.01 (*)    Hemoglobin 12.0 (*)    HCT 36.4 (*)    All other components within normal limits  COMPREHENSIVE METABOLIC PANEL - Abnormal; Notable for the following components:   Glucose, Bld 219 (*)    BUN 33 (*)    Creatinine, Ser 2.21 (*)    GFR calc non Af Amer 28 (*)    GFR calc Af Amer 32 (*)    All other components within normal limits  LIPASE, BLOOD - Abnormal; Notable for the following components:   Lipase 83 (*)    All other components within normal limits  URINALYSIS, ROUTINE W REFLEX MICROSCOPIC - Abnormal; Notable for the following components:   Color, Urine STRAW (*)    APPearance CLEAR (*)    Glucose, UA 50 (*)    All other components within normal limits  TROPONIN I (HIGH SENSITIVITY) - Abnormal; Notable for the following components:   Troponin I (High Sensitivity) 26 (*)    All other components within normal limits  TROPONIN I (HIGH SENSITIVITY) -  Abnormal; Notable for the following components:   Troponin I (High Sensitivity) 21 (*)    All other components within normal limits  SARS CORONAVIRUS 2 (HOSPITAL ORDER, [facility])  C DIFFICILE QUICK SCREEN W PCR REFLEX  MAGNESIUM  BETA-HYDROXYBUTYRIC ACID  GI PATHOGEN PANEL BY PCR, STOOL   ____________________________________________   ED ECG REPORT I, Vanessa Kinney, the attending physician, personally viewed and interpreted this ECG.  EKG is sinus bradycardia, no ST elevation, T wave inversion in aVL and V2 through V4, normal intervals.  T wave inversions appear new from prior ____________________________________________  RADIOLOGY Robert Bellow, personally viewed and evaluated these images (plain radiographs) as part of my medical decision making, as well as reviewing the written report by the radiologist.  ED MD interpretation:  Bilateral infiltrates   Official radiology report(s): Dg  Chest 1 View  Result Date: 01/27/2019 CLINICAL DATA:  Shortness of breath. Generalized weakness. Nausea and diarrhea. EXAM: CHEST  1 VIEW COMPARISON:  Two-view chest x-ray 04/09/2018. FINDINGS: The heart size is normal. Lung volumes are low. Ill-defined airspace opacities are present in the upper lobes bilaterally. There is no significant consolidation. Small effusions may be present. IMPRESSION: 1. Ill-defined airspace opacities in the upper lobes bilaterally. Given underlying interstitial coarsening, this could represent edema, but infection is suspected. 2. Small effusions. 3. Low lung volumes. Electronically Signed   By: San Morelle M.D.   On: 01/27/2019 15:35    ____________________________________________   PROCEDURES  Procedure(s) performed (including Critical Care):  Procedures   ____________________________________________   INITIAL IMPRESSION / ASSESSMENT AND PLAN / ED COURSE  Rishav Rockefeller was evaluated in Emergency Department on 01/27/2019 for the symptoms described in the history of present illness. He was evaluated in the context of the global COVID-19 pandemic, which necessitated consideration that the patient might be at risk for infection with the SARS-CoV-2 virus that causes COVID-19. Institutional protocols and algorithms that pertain to the evaluation of patients at risk for COVID-19 are in a state of rapid change based on information released by regulatory bodies including the CDC and federal and state organizations. These policies and algorithms were followed during the patient's care in the ED.    Patient presents with weakness and diarrhea.  Patient has no abdominal tenderness to suggest diverticulitis, perforation, SBO.  Will see if patient can give a stool sample to evaluate for C. difficile or other causes of diarrhea.  Will check electrolytes.  Given report of cough at home will get chest x-ray to evaluate for pneumonia.  Labs are reassuring with hemoglobin  around baseline.  Creatinine is slightly elevated at 2.21 last time it was 2.  Troponin was elevated 26.  Will get repeat. More then likely from his CKD.  No evidence of DKA.  Patient's x-ray does show concern for bilateral pneumonia.  Patient did have a negative coronavirus test.  On reevaluation patient he has been having a cough for 2 weeks.  He has been having generalized weakness to the point where today he slid down when standing up.  Did not hit his head.  Denies any pain anywhere else.  However given this your fall with new  pneumonia will discuss with the hospital team for admission.  Will cover with ceftriaxone and azithromycin.  Patient does not meet sirs criteria.  Discussed to the hospital team and they will admit patient.   ____________________________________________   FINAL CLINICAL IMPRESSION(S) / ED DIAGNOSES   Final diagnoses:  Pneumonia  of both upper lobes due to infectious organism [facility])      MEDICATIONS GIVEN DURING THIS VISIT:  Medications  cefTRIAXone (ROCEPHIN) 1 g in sodium chloride 0.9 % 100 mL IVPB (has no administration in time range)  azithromycin (ZITHROMAX) tablet 500 mg (has no administration in time range)  sodium chloride 0.9 % bolus 1,000 mL (0 mLs Intravenous Stopped 01/27/19 1615)     ED Discharge Orders    None       Note:  This document was prepared using Dragon voice recognition software and may include unintentional dictation errors.   Vanessa Rexford, MD 01/27/19 [phone]

## 2019-01-27 NOTE — ED Notes (Signed)
ED TO INPATIENT HANDOFF REPORT  ED Nurse Name and Phone #: [phone]  S Name/Age/Gender Phillip Reyes 78 y.o. male Room/Bed: ED19A/ED19A  Code Status   Code Status: Prior  Home/SNF/Other Home A/Ox4 Is this baseline? Yes   Triage Complete: Triage complete  Chief Complaint Weakness  Triage Note Pt from home via [place]. Per EMS, pt has a caregiver at home who st pt c/o general weakness this am, nausea, diarrhea for a few days. Denies fever at home. Pt denies CP/SHOB, denies malena in stool.   Productive cough for "few weeks, coughing up green stuff". Pt A/Ox4 upon arrival.     Allergies Allergies  Allergen Reactions  . Norco [Hydrocodone-Acetaminophen] Shortness Of Breath    Level of Care/Admitting Diagnosis ED Disposition    ED Disposition Condition Comment   Admit  The patient appears reasonably stabilized for admission considering the current resources, flow, and capabilities available in the ED at this time, and I doubt any other [facility] requiring further screening and/or treatment in the ED prior to admission is  present.       B Medical/Surgery History Past Medical History:  Diagnosis Date  . Arthritis   . Cancer ([place])   . COPD (chronic obstructive pulmonary disease) ([place])   . Diabetes mellitus without complication ([place])   . Hypertension   . Renal disorder   . Stroke [facility])    Past Surgical History:  Procedure Laterality Date  . APPENDECTOMY    . THROAT SURGERY       A IV Location/Drains/Wounds Patient Lines/Drains/Airways Status   Active Line/Drains/Airways    Name:   Placement date:   Placement time:   Site:   Days:   Peripheral IV 03/09/16 Right Hand   03/09/16    0900    Hand   1054   Peripheral IV 01/27/19 Left Forearm   01/27/19    1454    Forearm   less than 1          Intake/Output Last 24 hours No intake or output data in the 24 hours ending 01/27/19 1823  Labs/Imaging Results for orders placed or performed during the hospital encounter of  01/27/19 (from the past 48 hour(s))  CBC with Differential     Status: Abnormal   Collection Time: 01/27/19  2:53 PM  Result Value Ref Range   WBC 7.0 4.0 - 10.5 K/uL   RBC 4.01 (L) 4.22 - 5.81 MIL/uL   Hemoglobin 12.0 (L) 13.0 - 17.0 g/dL   HCT 36.4 (L) 39.0 - 52.0 %   MCV 90.8 80.0 - 100.0 fL   MCH 29.9 26.0 - 34.0 pg   MCHC 33.0 30.0 - 36.0 g/dL   RDW 14.6 11.5 - 15.5 %   Platelets 197 150 - 400 K/uL   nRBC 0.0 0.0 - 0.2 %   Neutrophils Relative % 67 %   Neutro Abs 4.6 1.7 - 7.7 K/uL   Lymphocytes Relative 20 %   Lymphs Abs 1.4 0.7 - 4.0 K/uL   Monocytes Relative 10 %   Monocytes Absolute 0.7 0.1 - 1.0 K/uL   Eosinophils Relative 2 %   Eosinophils Absolute 0.2 0.0 - 0.5 K/uL   Basophils Relative 1 %   Basophils Absolute 0.0 0.0 - 0.1 K/uL   Immature Granulocytes 0 %   Abs Immature Granulocytes 0.03 0.00 - 0.07 K/uL    Comment: Performed at [facility], [address], [place], [place] [address]  Comprehensive metabolic panel  Status: Abnormal   Collection Time: 01/27/19  2:53 PM  Result Value Ref Range   Sodium 141 135 - 145 mmol/L   Potassium 4.3 3.5 - 5.1 mmol/L   Chloride 104 98 - 111 mmol/L   CO2 27 22 - 32 mmol/L   Glucose, Bld 219 (H) 70 - 99 mg/dL   BUN 33 (H) 8 - 23 mg/dL   Creatinine, Ser 2.21 (H) 0.61 - 1.24 mg/dL   Calcium 9.3 8.9 - 10.3 mg/dL   Total Protein 7.1 6.5 - 8.1 g/dL   Albumin 4.1 3.5 - 5.0 g/dL   AST 18 15 - 41 U/L   ALT 26 0 - 44 U/L   Alkaline Phosphatase 78 38 - 126 U/L   Total Bilirubin 0.8 0.3 - 1.2 mg/dL   GFR calc non Af Amer 28 (L) >60 mL/min   GFR calc Af Amer 32 (L) >60 mL/min   Anion gap 10 5 - 15    Comment: Performed at [facility], [address] [place], [place] [address]  Lipase, blood     Status: Abnormal   Collection Time: 01/27/19  2:53 PM  Result Value Ref Range   Lipase 83 (H) 11 - 51 U/L    Comment: Performed at [facility], [place], [place] [address]  Troponin I  (High Sensitivity)     Status: Abnormal   Collection Time: 01/27/19  2:53 PM  Result Value Ref Range   Troponin I (High Sensitivity) 26 (H) <18 ng/L    Comment: (NOTE) Elevated high sensitivity troponin I (hsTnI) values and significant  changes across serial measurements may suggest ACS but many other  chronic and acute conditions are known to elevate hsTnI results.  Refer to the "Links" section for chest pain algorithms and additional  guidance. Performed at [facility], [address] [place], [place] [address]   Magnesium     Status: None   Collection Time: 01/27/19  2:53 PM  Result Value Ref Range   Magnesium 2.1 1.7 - 2.4 mg/dL    Comment: Performed at [facility], [address] [place], [place] [address]  Urinalysis, Routine w reflex microscopic     Status: Abnormal   Collection Time: 01/27/19  2:53 PM  Result Value Ref Range   Color, Urine STRAW (A) YELLOW   APPearance CLEAR (A) CLEAR   Specific Gravity, Urine 1.009 1.005 - 1.030   pH 7.0 5.0 - 8.0   Glucose, UA 50 (A) NEGATIVE mg/dL   Hgb urine dipstick NEGATIVE NEGATIVE   Bilirubin Urine NEGATIVE NEGATIVE   Ketones, ur NEGATIVE NEGATIVE mg/dL   Protein, ur NEGATIVE NEGATIVE mg/dL   Nitrite NEGATIVE NEGATIVE   Leukocytes,Ua NEGATIVE NEGATIVE    Comment: Performed at [facility], [address], [place], [place] [address]  Beta-hydroxybutyric acid     Status: None   Collection Time: 01/27/19  2:53 PM  Result Value Ref Range   Beta-Hydroxybutyric Acid 0.06 0.05 - 0.27 mmol/L    Comment: Performed at [facility], [address], [place], [place] [address]  SARS Coronavirus 2 [facility] order, Performed in [facility] hospital lab) Nasopharyngeal Nasopharyngeal Swab     Status: None   Collection Time: 01/27/19  3:09 PM   Specimen: Nasopharyngeal Swab  Result Value Ref Range   SARS Coronavirus 2 NEGATIVE NEGATIVE    Comment: (NOTE) If result is NEGATIVE SARS-CoV-2 target  nucleic acids are NOT DETECTED. The SARS-CoV-2 RNA is generally  detectable in upper and lower  respiratory specimens during the acute phase of infection. The lowest  concentration of SARS-CoV-2 viral copies this assay can detect is 250  copies / mL. A negative result does not preclude SARS-CoV-2 infection  and should not be used as the sole basis for treatment or other  patient management decisions.  A negative result may occur with  improper specimen collection / handling, submission of specimen other  than nasopharyngeal swab, presence of viral mutation(s) within the  areas targeted by this assay, and inadequate number of viral copies  (<250 copies / mL). A negative result must be combined with clinical  observations, patient history, and epidemiological information. If result is POSITIVE SARS-CoV-2 target nucleic acids are DETECTED. The SARS-CoV-2 RNA is generally detectable in upper and lower  respiratory specimens dur ing the acute phase of infection.  Positive  results are indicative of active infection with SARS-CoV-2.  Clinical  correlation with patient history and other diagnostic information is  necessary to determine patient infection status.  Positive results do  not rule out bacterial infection or co-infection with other viruses. If result is PRESUMPTIVE POSTIVE SARS-CoV-2 nucleic acids MAY BE PRESENT.   A presumptive positive result was obtained on the submitted specimen  and confirmed on repeat testing.  While 2019 novel coronavirus  (SARS-CoV-2) nucleic acids may be present in the submitted sample  additional confirmatory testing may be necessary for epidemiological  and / or clinical management purposes  to differentiate between  SARS-CoV-2 and other Sarbecovirus currently known to infect humans.  If clinically indicated additional testing with an alternate test  methodology [id number]) is advised. The SARS-CoV-2 RNA is generally  detectable in upper and lower  respiratory sp ecimens during the acute  phase of infection. The expected result is Negative. Fact Sheet for Patients:  StrictlyIdeas.no Fact Sheet for Healthcare Providers: BankingDealers.co.za This test is not yet approved or cleared by the [place] FDA and has been authorized for detection and/or diagnosis of SARS-CoV-2 by FDA under an Emergency Use Authorization (EUA).  This EUA will remain in effect (meaning this test can be used) for the duration of the COVID-19 declaration under Section 564(b)(1) of the Act, 21 U.S.C. section 360bbb-3(b)(1), unless the authorization is terminated or revoked sooner. Performed at [facility], [place], [place] [address]   Troponin I (High Sensitivity)     Status: Abnormal   Collection Time: 01/27/19  5:25 PM  Result Value Ref Range   Troponin I (High Sensitivity) 21 (H) <18 ng/L    Comment: (NOTE) Elevated high sensitivity troponin I (hsTnI) values and significant  changes across serial measurements may suggest ACS but many other  chronic and acute conditions are known to elevate hsTnI results.  Refer to the "Links" section for chest pain algorithms and additional  guidance. Performed at [facility], [address], [place], [place] [address]    Dg Chest 1 View  Result Date: 01/27/2019 CLINICAL DATA:  Shortness of breath. Generalized weakness. Nausea and diarrhea. EXAM: CHEST  1 VIEW COMPARISON:  Two-view chest x-ray 04/09/2018. FINDINGS: The heart size is normal. Lung volumes are low. Ill-defined airspace opacities are present in the upper lobes bilaterally. There is no significant consolidation. Small effusions may be present. IMPRESSION: 1. Ill-defined airspace opacities in the upper lobes bilaterally. Given underlying interstitial coarsening, this could represent edema, but infection is suspected. 2. Small effusions. 3. Low lung volumes. Electronically  Signed   By: San Morelle  M.D.   On: 01/27/2019 15:35    Pending Labs Unresulted Labs (From admission, onward)    Start     Ordered   01/27/19 1445  GI pathogen panel by PCR, stool  (Gastrointestinal Panel by PCR, Stool                                                                                                                                                     *Does Not include CLOSTRIDIUM DIFFICILE testing.**If CDIFF testing is needed, select the C Difficile Quick Screen w PCR reflex order below)  Once,   STAT     01/27/19 1444   01/27/19 1445  C difficile quick scan w PCR reflex  (C Difficile quick screen w PCR reflex panel)  Once, for 24 hours,   STAT     01/27/19 1444          Vitals/Pain Today's Vitals   01/27/19 1600 01/27/19 1615 01/27/19 1655 01/27/19 1700  BP: (!) 161/60   (!) 155/54  Pulse:  (!) 47  (!) 51  Resp: 16 15  (!) 21  Temp:      TempSrc:      SpO2:  100% 100% 100%  Weight:      Height:      PainSc:        Isolation Precautions No active isolations  Medications Medications  cefTRIAXone (ROCEPHIN) 1 g in sodium chloride 0.9 % 100 mL IVPB (has no administration in time range)  azithromycin (ZITHROMAX) tablet 500 mg (has no administration in time range)  sodium chloride 0.9 % bolus 1,000 mL (0 mLs Intravenous Stopped 01/27/19 1615)    Mobility walks with device Low fall risk      R Recommendations: See Admitting Provider Note  Report given to:   Additional Notes:

## 2019-01-27 NOTE — ED Notes (Signed)
Patient currently resting in bed.  No distress noted. Will continue to monitor.

## 2019-01-27 NOTE — ED Notes (Signed)
ED TO INPATIENT HANDOFF REPORT  ED Nurse Name and Phone #: Joelene Millin [phone]  S Name/Age/Gender Phillip Reyes 78 y.o. male Room/Bed: ED19A/ED19A  Code Status   Code Status: Prior  Home/SNF/Other Home Patient oriented to: self, place, time and situation Is this baseline? Yes   Triage Complete: Triage complete  Chief Complaint Weakness  Triage Note Pt from home via [place]. Per EMS, pt has a caregiver at home who st pt c/o general weakness this am, nausea, diarrhea for a few days. Denies fever at home. Pt denies CP/SHOB, denies malena in stool.   Productive cough for "few weeks, coughing up green stuff". Pt A/Ox4 upon arrival.     Allergies Allergies  Allergen Reactions  . Norco [Hydrocodone-Acetaminophen] Shortness Of Breath    Level of Care/Admitting Diagnosis ED Disposition    ED Disposition Condition [facility] Area: [place] [[id number]]  Level of Care: Med-Surg [16]  Covid Evaluation: Asymptomatic Screening Protocol (No Symptoms)  Diagnosis: CAP (community acquired pneumonia) [144315]  Admitting Physician: Hyman Bible DODD [[id number]]  Attending Physician: Hyman Bible DODD [[id number]]  Estimated length of stay: past midnight tomorrow  Certification:: I certify this patient will need inpatient services for at least 2 midnights  PT Class (Do Not Modify): Inpatient [101]  PT Acc Code (Do Not Modify): Private [1]       B Medical/Surgery History Past Medical History:  Diagnosis Date  . Arthritis   . Cancer ([place])   . COPD (chronic obstructive pulmonary disease) ([place])   . Diabetes mellitus without complication ([place])   . Hypertension   . Renal disorder   . Stroke [facility])    Past Surgical History:  Procedure Laterality Date  . APPENDECTOMY    . THROAT SURGERY       A IV Location/Drains/Wounds Patient Lines/Drains/Airways Status   Active Line/Drains/Airways    Name:   Placement date:   Placement time:   Site:   Days:    Peripheral IV 03/09/16 Right Hand   03/09/16    0900    Hand   1054   Peripheral IV 01/27/19 Left Forearm   01/27/19    1454    Forearm   less than 1          Intake/Output Last 24 hours  Intake/Output Summary (Last 24 hours) at 01/27/2019 2356 Last data filed at 01/27/2019 2236 Gross per 24 hour  Intake -  Output 1150 ml  Net -1150 ml    Labs/Imaging Results for orders placed or performed during the hospital encounter of 01/27/19 (from the past 48 hour(s))  CBC with Differential     Status: Abnormal   Collection Time: 01/27/19  2:53 PM  Result Value Ref Range   WBC 7.0 4.0 - 10.5 K/uL   RBC 4.01 (L) 4.22 - 5.81 MIL/uL   Hemoglobin 12.0 (L) 13.0 - 17.0 g/dL   HCT 36.4 (L) 39.0 - 52.0 %   MCV 90.8 80.0 - 100.0 fL   MCH 29.9 26.0 - 34.0 pg   MCHC 33.0 30.0 - 36.0 g/dL   RDW 14.6 11.5 - 15.5 %   Platelets 197 150 - 400 K/uL   nRBC 0.0 0.0 - 0.2 %   Neutrophils Relative % 67 %   Neutro Abs 4.6 1.7 - 7.7 K/uL   Lymphocytes Relative 20 %   Lymphs Abs 1.4 0.7 - 4.0 K/uL   Monocytes Relative 10 %   Monocytes Absolute 0.7 0.1 - 1.0 K/uL  Eosinophils Relative 2 %   Eosinophils Absolute 0.2 0.0 - 0.5 K/uL   Basophils Relative 1 %   Basophils Absolute 0.0 0.0 - 0.1 K/uL   Immature Granulocytes 0 %   Abs Immature Granulocytes 0.03 0.00 - 0.07 K/uL    Comment: Performed at [facility], [address] [place], [place] [address]  Comprehensive metabolic panel     Status: Abnormal   Collection Time: 01/27/19  2:53 PM  Result Value Ref Range   Sodium 141 135 - 145 mmol/L   Potassium 4.3 3.5 - 5.1 mmol/L   Chloride 104 98 - 111 mmol/L   CO2 27 22 - 32 mmol/L   Glucose, Bld 219 (H) 70 - 99 mg/dL   BUN 33 (H) 8 - 23 mg/dL   Creatinine, Ser 2.21 (H) 0.61 - 1.24 mg/dL   Calcium 9.3 8.9 - 10.3 mg/dL   Total Protein 7.1 6.5 - 8.1 g/dL   Albumin 4.1 3.5 - 5.0 g/dL   AST 18 15 - 41 U/L   ALT 26 0 - 44 U/L   Alkaline Phosphatase 78 38 - 126 U/L   Total Bilirubin 0.8 0.3 -  1.2 mg/dL   GFR calc non Af Amer 28 (L) >60 mL/min   GFR calc Af Amer 32 (L) >60 mL/min   Anion gap 10 5 - 15    Comment: Performed at [facility], [address] [place], [place] [address]  Lipase, blood     Status: Abnormal   Collection Time: 01/27/19  2:53 PM  Result Value Ref Range   Lipase 83 (H) 11 - 51 U/L    Comment: Performed at [facility], [place], [place] [address]  Troponin I (High Sensitivity)     Status: Abnormal   Collection Time: 01/27/19  2:53 PM  Result Value Ref Range   Troponin I (High Sensitivity) 26 (H) <18 ng/L    Comment: (NOTE) Elevated high sensitivity troponin I (hsTnI) values and significant  changes across serial measurements may suggest ACS but many other  chronic and acute conditions are known to elevate hsTnI results.  Refer to the "Links" section for chest pain algorithms and additional  guidance. Performed at [facility], [address] [place], [place] [address]   Magnesium     Status: None   Collection Time: 01/27/19  2:53 PM  Result Value Ref Range   Magnesium 2.1 1.7 - 2.4 mg/dL    Comment: Performed at [facility], [address] [place], [place] [address]  Urinalysis, Routine w reflex microscopic     Status: Abnormal   Collection Time: 01/27/19  2:53 PM  Result Value Ref Range   Color, Urine STRAW (A) YELLOW   APPearance CLEAR (A) CLEAR   Specific Gravity, Urine 1.009 1.005 - 1.030   pH 7.0 5.0 - 8.0   Glucose, UA 50 (A) NEGATIVE mg/dL   Hgb urine dipstick NEGATIVE NEGATIVE   Bilirubin Urine NEGATIVE NEGATIVE   Ketones, ur NEGATIVE NEGATIVE mg/dL   Protein, ur NEGATIVE NEGATIVE mg/dL   Nitrite NEGATIVE NEGATIVE   Leukocytes,Ua NEGATIVE NEGATIVE    Comment: Performed at [facility], [address] [place], [place] [address]  Beta-hydroxybutyric acid     Status: None   Collection Time: 01/27/19  2:53 PM  Result Value Ref Range   Beta-Hydroxybutyric Acid 0.06  0.05 - 0.27 mmol/L    Comment: Performed at [facility], [address] [place], [place]  [place] [facility] order, Performed in [facility] hospital lab) Nasopharyngeal Nasopharyngeal Swab     Status: None   Collection Time: 01/27/19  3:09 PM   Specimen: Nasopharyngeal Swab  Result Value Ref Range   SARS Coronavirus 2 NEGATIVE NEGATIVE    Comment: (NOTE) If result is NEGATIVE SARS-CoV-2 target nucleic acids are NOT DETECTED. The SARS-CoV-2 RNA is generally detectable in upper and lower  respiratory specimens during the acute phase of infection. The lowest  concentration of SARS-CoV-2 viral copies this assay can detect is 250  copies / mL. A negative result does not preclude SARS-CoV-2 infection  and should not be used as the sole basis for treatment or other  patient management decisions.  A negative result may occur with  improper specimen collection / handling, submission of specimen other  than nasopharyngeal swab, presence of viral mutation(s) within the  areas targeted by this assay, and inadequate number of viral copies  (<250 copies / mL). A negative result must be combined with clinical  observations, patient history, and epidemiological information. If result is POSITIVE SARS-CoV-2 target nucleic acids are DETECTED. The SARS-CoV-2 RNA is generally detectable in upper and lower  respiratory specimens dur ing the acute phase of infection.  Positive  results are indicative of active infection with SARS-CoV-2.  Clinical  correlation with patient history and other diagnostic information is  necessary to determine patient infection status.  Positive results do  not rule out bacterial infection or co-infection with other viruses. If result is PRESUMPTIVE POSTIVE SARS-CoV-2 nucleic acids MAY BE PRESENT.   A presumptive positive result was obtained on the submitted specimen  and confirmed on repeat testing.  While 2019 novel coronavirus  (SARS-CoV-2)  nucleic acids may be present in the submitted sample  additional confirmatory testing may be necessary for epidemiological  and / or clinical management purposes  to differentiate between  SARS-CoV-2 and other Sarbecovirus currently known to infect humans.  If clinically indicated additional testing with an alternate test  methodology [phone]) is advised. The SARS-CoV-2 RNA is generally  detectable in upper and lower respiratory sp ecimens during the acute  phase of infection. The expected result is Negative. Fact Sheet for Patients:  StrictlyIdeas.no Fact Sheet for Healthcare Providers: BankingDealers.co.za This test is not yet approved or cleared by the [place] FDA and has been authorized for detection and/or diagnosis of SARS-CoV-2 by FDA under an Emergency Use Authorization (EUA).  This EUA will remain in effect (meaning this test can be used) for the duration of the COVID-19 declaration under Section 564(b)(1) of the Act, 21 U.S.C. section 360bbb-3(b)(1), unless the authorization is terminated or revoked sooner. Performed at [facility], [place], [place] [address]   Troponin I (High Sensitivity)     Status: Abnormal   Collection Time: 01/27/19  5:25 PM  Result Value Ref Range   Troponin I (High Sensitivity) 21 (H) <18 ng/L    Comment: (NOTE) Elevated high sensitivity troponin I (hsTnI) values and significant  changes across serial measurements may suggest ACS but many other  chronic and acute conditions are known to elevate hsTnI results.  Refer to the "Links" section for chest pain algorithms and additional  guidance. Performed at [facility] [address] [place],  [address]   Glucose, capillary     Status: Abnormal   Collection Time: 01/27/19  9:44 PM  Result Value Ref Range   Glucose-Capillary 113 (H) 70 - 99 mg/dL  Dg Chest 1 View  Result Date: 01/27/2019 CLINICAL  DATA:  Shortness of breath. Generalized weakness. Nausea and diarrhea. EXAM: CHEST  1 VIEW COMPARISON:  Two-view chest x-ray 04/09/2018. FINDINGS: The heart size is normal. Lung volumes are low. Ill-defined airspace opacities are present in the upper lobes bilaterally. There is no significant consolidation. Small effusions may be present. IMPRESSION: 1. Ill-defined airspace opacities in the upper lobes bilaterally. Given underlying interstitial coarsening, this could represent edema, but infection is suspected. 2. Small effusions. 3. Low lung volumes. Electronically Signed   By: San Morelle M.D.   On: 01/27/2019 15:35    Pending Labs Unresulted Labs (From admission, onward)    Start     Ordered   01/27/19 1445  GI pathogen panel by PCR, stool  (Gastrointestinal Panel by PCR, Stool                                                                                                                                                     *Does Not include CLOSTRIDIUM DIFFICILE testing.**If CDIFF testing is needed, select the C Difficile Quick Screen w PCR reflex order below)  Once,   STAT     01/27/19 1444   01/27/19 1445  C difficile quick scan w PCR reflex  (C Difficile quick screen w PCR reflex panel)  Once, for 24 hours,   STAT     01/27/19 1444   Signed and Held  Procalcitonin - Baseline  Add-on,   STAT     Signed and Held   Signed and Held  Respiratory Panel by PCR  (Respiratory virus panel with precautions)  Once,   R     Signed and Held   Signed and Held  Hemoglobin A1c  Once,   R    Comments: To assess prior glycemic control    Signed and Held   Signed and Held  Brain natriuretic peptide  Add-on,   R     Signed and Held          Vitals/Pain Today's Vitals   01/27/19 2100 01/27/19 2130 01/27/19 2239 01/27/19 2240  BP: (!) 168/67 (!) 179/72 (!) 167/58   Pulse: (!) 51 (!) 52  61  Resp: 15 16 20  (!) 22  Temp:      TempSrc:      SpO2: 98% 99%  100%  Weight:      Height:       PainSc:        Isolation Precautions No active isolations  Medications Medications  insulin aspart (novoLOG) injection 0-5 Units (0 Units Subcutaneous Not Given 01/27/19 2155)  sodium chloride 0.9 % bolus 1,000 mL (0 mLs Intravenous Stopped 01/27/19 1615)  cefTRIAXone (ROCEPHIN) 1 g in sodium chloride 0.9 % 100 mL IVPB (0 g Intravenous Stopped 01/27/19 2141)  azithromycin (ZITHROMAX) tablet 500 mg (  500 mg Oral Given 01/27/19 1826)    Mobility walks with device Moderate fall risk   Focused Assessments Weakness   R Recommendations: See Admitting Provider Note  Report given to:   Additional Notes:

## 2019-01-27 NOTE — ED Triage Notes (Addendum)
Pt from home via [facility]. Per EMS, pt has a caregiver at home who st pt c/o general weakness this am, nausea, diarrhea for a few days. Denies fever at home. Pt denies CP/SHOB, denies malena in stool.   Productive cough for "few weeks, coughing up green stuff". Pt A/Ox4 upon arrival.

## 2019-01-27 NOTE — H&P (Signed)
[facility] [place] NAME: Phillip Reyes    MR#:  [id number]  DATE OF BIRTH:  11-12-1940  DATE OF ADMISSION:  01/27/2019  PRIMARY CARE PHYSICIAN: [facility]   REQUESTING/REFERRING PHYSICIAN: Marjean Donna, MD  CHIEF COMPLAINT:   Chief Complaint  Patient presents with   Weakness   Diarrhea   Nausea    HISTORY OF PRESENT ILLNESS:  Phillip Reyes  is a 78 y.o. male with a known history of COPD, hypertension, type 2 diabetes, history of stroke presented to the ED with generalized weakness and shortness of breath that started earlier today.  Patient also endorses cough productive of yellow sputum.  He did have 4 episodes of diarrhea this morning.  He denies any abdominal pain, hematochezia, melena.  He endorses nausea, but denies vomiting.  He denies any fevers or chills.  He endorses chest pain that "feels like a cramp in the chest".  He denies any lower extremity edema.  In the ED, he was bradycardic with heart rates in the 40s and mildly hypertensive.  Labs were significant for creatinine 2.21, lipase 83, hemoglobin 12.0, troponin 26 > 21.  UA was unremarkable.  Chest x-ray with airspace opacities in the upper lobes bilaterally, could be edema versus infection.  He was given some broad-spectrum antibiotics.  Hospitalists were called for admission.  PAST MEDICAL HISTORY:   Past Medical History:  Diagnosis Date   Arthritis    Cancer ([place])    COPD (chronic obstructive pulmonary disease) ([place])    Diabetes mellitus without complication ([place])    Hypertension    Renal disorder    Stroke ([place])     PAST SURGICAL HISTORY:   Past Surgical History:  Procedure Laterality Date   APPENDECTOMY     THROAT SURGERY      SOCIAL HISTORY:   Social History   Tobacco Use   Smoking status: Current Every Day Smoker    Packs/day: 0.50    Years: 61.00    Pack years: 30.50    Types: Cigarettes   Smokeless tobacco: Never  Used  Substance Use Topics   Alcohol use: No    FAMILY HISTORY:   Family History  Problem Relation Age of Onset   Congestive Heart Failure Father    Other Mother        unknown medical history    DRUG ALLERGIES:   Allergies  Allergen Reactions   Norco [Hydrocodone-Acetaminophen] Shortness Of Breath    REVIEW OF SYSTEMS:   Review of Systems  Constitutional: Negative for chills and fever.  HENT: Negative for congestion and sore throat.   Eyes: Negative for blurred vision and double vision.  Respiratory: Positive for cough, sputum production and shortness of breath. Negative for wheezing.   Cardiovascular: Positive for chest pain. Negative for leg swelling.  Gastrointestinal: Positive for diarrhea and nausea. Negative for abdominal pain, blood in stool, constipation, melena and vomiting.  Genitourinary: Negative for dysuria and urgency.  Musculoskeletal: Negative for back pain and neck pain.  Neurological: Negative for dizziness and headaches.  Psychiatric/Behavioral: Negative for depression. The patient is not nervous/anxious.     MEDICATIONS AT HOME:   Prior to Admission medications   Medication Sig Start Date End Date Taking? Authorizing Provider  acetaminophen (TYLENOL) 325 MG tablet Take 650 mg by mouth every 6 (six) hours as needed.   Yes [provider]  albuterol (PROVENTIL) (2.5 MG/3ML) 0.083% nebulizer solution Take 2.5 mg by nebulization every  6 (six) hours as needed for wheezing or shortness of breath.   Yes [provider]  amLODipine (NORVASC) 10 MG tablet Take 10 mg by mouth daily.    Yes [provider]  aspirin EC 81 MG tablet Take 81 mg by mouth daily.   Yes [provider]  atorvastatin (LIPITOR) 80 MG tablet Take 80 mg by mouth at bedtime.   Yes [provider]  budesonide-formoterol (SYMBICORT) 160-4.5 MCG/ACT inhaler Inhale 2 puffs into the lungs 2 (two) times daily.   Yes [provider]    citalopram (CELEXA) 40 MG tablet Take 20 mg by mouth daily.    Yes [provider]  doxazosin (CARDURA) 4 MG tablet Take 4 mg by mouth at bedtime.    Yes [provider]  gabapentin (NEURONTIN) 400 MG capsule Take 400 mg by mouth daily.    Yes [provider]  insulin glargine (LANTUS) 100 UNIT/ML injection Inject 55 Units into the skin at bedtime.    Yes [provider]  insulin lispro (HUMALOG) 100 UNIT/ML injection Inject 12 Units into the skin 3 (three) times daily with meals. Use sliding scale to increase amount of insulin needed based on blood sugar.   Yes [provider]  ipratropium-albuterol (DUONEB) 0.5-2.5 (3) MG/3ML SOLN Take 3 mLs by nebulization every 6 (six) hours as needed.   Yes [provider]  lisinopril (PRINIVIL,ZESTRIL) 5 MG tablet Take 2.5 mg by mouth daily.   Yes [provider]  omeprazole (PRILOSEC) 20 MG capsule Take 20 mg by mouth daily.   Yes [provider]  vitamin B-12 (CYANOCOBALAMIN) 1000 MCG tablet Take 1,000 mcg by mouth daily.   Yes [provider]  Vitamin D, Ergocalciferol, (DRISDOL) 1.25 MG (50000 UT) CAPS capsule Take 50,000 Units by mouth every 7 (seven) days.   Yes [provider]      VITAL SIGNS:  Blood pressure (!) 155/54, pulse (!) 51, temperature (!) 97.4 F (36.3 C), temperature source Oral, resp. rate (!) 21, height 5\' 8"  (1.727 m), weight 73.9 kg, SpO2 100 %.  PHYSICAL EXAMINATION:  Physical Exam  GENERAL:  78 y.o.-year-old patient lying in the bed with no acute distress.  EYES: Pupils equal, round, reactive to light and accommodation. No scleral icterus. Extraocular muscles intact.  HEENT: Head atraumatic, normocephalic. Oropharynx and nasopharynx clear.  NECK:  Supple, no jugular venous distention. No thyroid enlargement, no tenderness.  LUNGS: Normal breath sounds bilaterally, no wheezing, rales,rhonchi or crepitation. No use of accessory muscles of  respiration.  Able to speak in full sentences. CARDIOVASCULAR: bradycardic, regular rhythm, S1, S2 normal. No murmurs, rubs, or gallops.  ABDOMEN: Soft, nontender, nondistended. Bowel sounds present. No organomegaly or mass.  EXTREMITIES: No pedal edema, cyanosis, or clubbing.  NEUROLOGIC: Cranial nerves II through XII are intact. +global weakness. Sensation intact. Gait not checked.  PSYCHIATRIC: The patient is alert and oriented x 3.  SKIN: No obvious rash, lesion, or ulcer.   LABORATORY PANEL:   CBC Recent Labs  Lab 01/27/19 1453  WBC 7.0  HGB 12.0*  HCT 36.4*  PLT 197   ------------------------------------------------------------------------------------------------------------------  Chemistries  Recent Labs  Lab 01/27/19 1453  NA 141  K 4.3  CL 104  CO2 27  GLUCOSE 219*  BUN 33*  CREATININE 2.21*  CALCIUM 9.3  MG 2.1  AST 18  ALT 26  ALKPHOS 78  BILITOT 0.8   ------------------------------------------------------------------------------------------------------------------  Cardiac Enzymes No results for input(s): TROPONINI in the last 168  hours. ------------------------------------------------------------------------------------------------------------------  RADIOLOGY:  Dg Chest 1 View  Result Date: 01/27/2019 CLINICAL DATA:  Shortness of breath. Generalized weakness. Nausea and diarrhea. EXAM: CHEST  1 VIEW COMPARISON:  Two-view chest x-ray 04/09/2018. FINDINGS: The heart size is normal. Lung volumes are low. Ill-defined airspace opacities are present in the upper lobes bilaterally. There is no significant consolidation. Small effusions may be present. IMPRESSION: 1. Ill-defined airspace opacities in the upper lobes bilaterally. Given underlying interstitial coarsening, this could represent edema, but infection is suspected. 2. Small effusions. 3. Low lung volumes. Electronically Signed   By: San Morelle M.D.   On: 01/27/2019 15:35      IMPRESSION  AND PLAN:   Community-acquired pneumonia- chest x-ray with bilateral upper lobe opacities and patient endorses shortness of breath and cough productive of yellow sputum.  No signs of sepsis on admission. -COVID test negative -Continue ceftriaxone and azithromycin -Check respiratory viral panel, given patient's associated diarrhea -Check procalcitonin -Check BNP, as the opacities on his CXR could be edema (althought patient does not appear volume overloaded)  Diarrhea- may be due to a viral gastroenteritis.  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 Baseline creatinine is 1.5-1.8. -Gentle IVFs -Holding home lisinopril  -Check renal US  Generalized weakness-may be due to above -Treatment as above -PT consult  Elevated lipase- patient denies any abdominal pain, so doubt acute pancreatitis. -Monitor  Hypertension- BP mildly elevated in the ED -Continue home Norvasc, doxazosin  Type 2 diabetes- blood sugar elevated in the ED -Continue lantus at half home dose -Moderate SSI  Hyperlipidemia- stable -Continue home Lipitor  Depression- stable -Continue home Celexa  All the records are reviewed and case discussed with ED provider. Management plans discussed with the patient, family and they are in agreement.  CODE STATUS: DNR  TOTAL TIME TAKING CARE OF THIS PATIENT: 45 minutes.    Berna Spare Labella Zahradnik M.D on 01/27/2019 at 7:47 PM  Between 7am to 6pm - Pager - [phone]  After 6pm go to [email or web address] - Proofreader  [facility]  Office  [phone]  CC: Primary care physician; [place]   Note: This dictation was prepared with Dragon dictation along with smaller phrase technology. Any transcriptional errors that result from this process are unintentional.

## 2019-01-27 NOTE — ED Notes (Signed)
This RN spoke with Avante, pt's caregiver to provide update. CBG 224 given 10 units of lantus at 1030am at home; normal PO/fluid.

## 2019-01-27 NOTE — ED Notes (Signed)
Pt unable to void/stool at this time.

## 2019-01-27 NOTE — ED Notes (Signed)
Pt st urgency to pee but unable to at this time. MD Jari Pigg notified.

## 2019-01-27 NOTE — Progress Notes (Signed)
Family Meeting Note  Advance Directive:no  Today a meeting took place with the Patient.  Patient is able to participate.  The following clinical team members were present during this meeting:MD  The following were discussed:Patient's diagnosis: CAP, Patient's progosis: Unable to determine and Goals for treatment: DNR  Additional follow-up to be provided: prn  Time spent during discussion:20 minutes  Evette Doffing, MD

## 2019-01-28 ENCOUNTER — Inpatient Hospital Stay: Payer: Medicare HMO

## 2019-01-28 LAB — BASIC METABOLIC PANEL
Anion gap: 11 (ref 5–15)
BUN: 24 mg/dL — ABNORMAL HIGH (ref 8–23)
CO2: 22 mmol/L (ref 22–32)
Calcium: 8.5 mg/dL — ABNORMAL LOW (ref 8.9–10.3)
Chloride: 107 mmol/L (ref 98–111)
Creatinine, Ser: 2.06 mg/dL — ABNORMAL HIGH (ref 0.61–1.24)
GFR calc Af Amer: 35 mL/min — ABNORMAL LOW (ref >60)
GFR calc non Af Amer: 30 mL/min — ABNORMAL LOW (ref >60)
Glucose, Bld: 250 mg/dL — ABNORMAL HIGH (ref 70–99)
Potassium: 3.4 mmol/L — ABNORMAL LOW (ref 3.5–5.1)
Sodium: 140 mmol/L (ref 135–145)

## 2019-01-28 LAB — GLUCOSE, CAPILLARY
Glucose-Capillary: 121 mg/dL — ABNORMAL HIGH (ref 70–99)
Glucose-Capillary: 243 mg/dL — ABNORMAL HIGH (ref 70–99)
Glucose-Capillary: 202 mg/dL — ABNORMAL HIGH (ref 70–99)
Glucose-Capillary: 174 mg/dL — ABNORMAL HIGH (ref 70–99)

## 2019-01-28 LAB — RESPIRATORY PANEL BY PCR

## 2019-01-28 LAB — HEMOGLOBIN A1C
Hgb A1c MFr Bld: 6.9 % — ABNORMAL HIGH (ref 4.8–5.6)
Mean Plasma Glucose: 151.33 mg/dL

## 2019-01-28 LAB — BRAIN NATRIURETIC PEPTIDE: B Natriuretic Peptide: 227 pg/mL — ABNORMAL HIGH (ref 0.0–100.0)

## 2019-01-28 LAB — PROCALCITONIN: Procalcitonin: <0.1 ng/mL

## 2019-01-28 MED ORDER — ALBUTEROL SULFATE (2.5 MG/3ML) 0.083% IN NEBU
2.5000 mg | INHALATION_SOLUTION | RESPIRATORY_TRACT | Status: DC | PRN
  Administered 2019-01-29: 08:00:00 2.5 mg via RESPIRATORY_TRACT
  Filled 2019-01-28: qty 3

## 2019-01-28 MED ORDER — SODIUM CHLORIDE 0.9 % IV SOLN
INTRAVENOUS | Status: DC
  Administered 2019-01-28 – 2019-01-29 (×4): via INTRAVENOUS

## 2019-01-28 MED ORDER — SODIUM CHLORIDE 0.9 % IV SOLN
1.0000 g | INTRAVENOUS | Status: DC
  Administered 2019-01-28: 1 g via INTRAVENOUS
  Filled 2019-01-28 (×2): qty 10

## 2019-01-28 MED ORDER — PANTOPRAZOLE SODIUM 40 MG PO TBEC
40.0000 mg | DELAYED_RELEASE_TABLET | Freq: Every day | ORAL | Status: DC
  Administered 2019-01-28 – 2019-01-29 (×2): 40 mg via ORAL
  Filled 2019-01-28 (×2): qty 1

## 2019-01-28 MED ORDER — POTASSIUM CHLORIDE CRYS ER 20 MEQ PO TBCR
20.0000 meq | EXTENDED_RELEASE_TABLET | Freq: Once | ORAL | Status: AC
  Administered 2019-01-28: 15:00:00 20 meq via ORAL
  Filled 2019-01-28: qty 1

## 2019-01-28 MED ORDER — IPRATROPIUM-ALBUTEROL 0.5-2.5 (3) MG/3ML IN SOLN
RESPIRATORY_TRACT | Status: AC
  Administered 2019-01-28: 01:00:00 3 mL
  Filled 2019-01-28: qty 3

## 2019-01-28 MED ORDER — INSULIN ASPART 100 UNIT/ML ~~LOC~~ SOLN
0.0000 [IU] | Freq: Three times a day (TID) | SUBCUTANEOUS | Status: DC
  Administered 2019-01-28: 5 [IU] via SUBCUTANEOUS
  Administered 2019-01-28: 09:00:00 2 [IU] via SUBCUTANEOUS
  Administered 2019-01-28: 12:00:00 5 [IU] via SUBCUTANEOUS
  Administered 2019-01-29: 3 [IU] via SUBCUTANEOUS
  Filled 2019-01-28 (×4): qty 1

## 2019-01-28 MED ORDER — IPRATROPIUM-ALBUTEROL 0.5-2.5 (3) MG/3ML IN SOLN
3.0000 mL | Freq: Four times a day (QID) | RESPIRATORY_TRACT | Status: DC
  Administered 2019-01-28 (×3): 3 mL via RESPIRATORY_TRACT
  Filled 2019-01-28 (×3): qty 3

## 2019-01-28 MED ORDER — INSULIN GLARGINE 100 UNIT/ML ~~LOC~~ SOLN
20.0000 [IU] | Freq: Every day | SUBCUTANEOUS | Status: DC
  Administered 2019-01-28: 20 [IU] via SUBCUTANEOUS
  Filled 2019-01-28 (×2): qty 0.2

## 2019-01-28 MED ORDER — ENOXAPARIN SODIUM 30 MG/0.3ML ~~LOC~~ SOLN
30.0000 mg | SUBCUTANEOUS | Status: DC
  Administered 2019-01-28: 09:00:00 30 mg via SUBCUTANEOUS
  Filled 2019-01-28: qty 0.3

## 2019-01-28 MED ORDER — ASPIRIN EC 81 MG PO TBEC
81.0000 mg | DELAYED_RELEASE_TABLET | Freq: Every day | ORAL | Status: DC
  Administered 2019-01-28 – 2019-01-29 (×2): 81 mg via ORAL
  Filled 2019-01-28 (×2): qty 1

## 2019-01-28 MED ORDER — ATORVASTATIN CALCIUM 20 MG PO TABS
80.0000 mg | ORAL_TABLET | Freq: Every day | ORAL | Status: DC
  Administered 2019-01-28 (×2): 80 mg via ORAL
  Filled 2019-01-28 (×2): qty 4

## 2019-01-28 MED ORDER — CITALOPRAM HYDROBROMIDE 20 MG PO TABS
20.0000 mg | ORAL_TABLET | Freq: Every day | ORAL | Status: DC
  Administered 2019-01-28 – 2019-01-29 (×2): 20 mg via ORAL
  Filled 2019-01-28 (×2): qty 1

## 2019-01-28 MED ORDER — AMLODIPINE BESYLATE 5 MG PO TABS
10.0000 mg | ORAL_TABLET | Freq: Every day | ORAL | Status: DC
  Administered 2019-01-28 – 2019-01-29 (×2): 10 mg via ORAL
  Filled 2019-01-28 (×2): qty 2

## 2019-01-28 MED ORDER — SODIUM CHLORIDE 0.9 % IV SOLN
500.0000 mg | INTRAVENOUS | Status: DC
  Administered 2019-01-28: 19:00:00 500 mg via INTRAVENOUS
  Filled 2019-01-28 (×2): qty 500

## 2019-01-28 MED ORDER — GABAPENTIN 100 MG PO CAPS
100.0000 mg | ORAL_CAPSULE | Freq: Every day | ORAL | Status: DC
  Administered 2019-01-28 (×2): 100 mg via ORAL
  Filled 2019-01-28 (×2): qty 1

## 2019-01-28 MED ORDER — MOMETASONE FURO-FORMOTEROL FUM 200-5 MCG/ACT IN AERO
2.0000 | INHALATION_SPRAY | Freq: Two times a day (BID) | RESPIRATORY_TRACT | Status: DC
  Administered 2019-01-28 – 2019-01-29 (×4): 2 via RESPIRATORY_TRACT
  Filled 2019-01-28: qty 8.8

## 2019-01-28 MED ORDER — DOXAZOSIN MESYLATE 4 MG PO TABS
4.0000 mg | ORAL_TABLET | Freq: Every day | ORAL | Status: DC
  Administered 2019-01-28 (×2): 4 mg via ORAL
  Filled 2019-01-28 (×3): qty 1

## 2019-01-28 MED ORDER — VITAMIN B-12 1000 MCG PO TABS
1000.0000 ug | ORAL_TABLET | Freq: Every day | ORAL | Status: DC
  Administered 2019-01-28 – 2019-01-29 (×2): 1000 ug via ORAL
  Filled 2019-01-28 (×2): qty 1

## 2019-01-28 NOTE — Evaluation (Signed)
Physical Therapy Evaluation Patient Details Name: Phillip Reyes MRN: [id number] DOB: Feb 19, 1941 Today's Date: 01/28/2019   History of Present Illness  78 y.o. male with a known history of COPD, hypertension, type 2 diabetes, history of multiple strokes and an MI, presented to the ED with generalized weakness and shortness of breath.  Pt has having diarrhea that is resolved at time of PT exam.  Pt admitted with pneumonia.  Clinical Impression  Pt showed good effort and confidence with bed mobility and during ambulation.  He had some fleeting issue with getting to standing, but with appropriate cuing for UE use and positioning he easily attained standing.  Pt's O2 remained in the high 90s on room air t/o PT exam and subsequent gait training, HR also remained stable t/o the effort. He reports he feels only minimally different from his baseline, he has been working with [facility] and this PT agrees that he is safe to go home and to continue PT services.      Follow Up Recommendations Home health PT    Equipment Recommendations  None recommended by PT    Recommendations for Other Services       Precautions / Restrictions Precautions Precautions: Fall Restrictions Weight Bearing Restrictions: No      Mobility  Bed Mobility Overal bed mobility: Independent             General bed mobility comments: Pt easily gets himself to EOB w/o assist  Transfers Overall transfer level: Independent Equipment used: Rolling walker (2 wheeled)             General transfer comment: Initially attempted (unsuccessfully) to stand w/o UEs, using bed to push up he easily gets to standing  Ambulation/Gait Ambulation/Gait assistance: Supervision Gait Distance (Feet): 200 Feet Assistive device: Rolling walker (2 wheeled);None       General Gait Details: Pt was able to ambulate with confident and consistent cadence.  Showed no LOBs or safety issues, vitals stayed appropriate and stable the entire  time and pt reports feeling essentially at his baseline.  He was able to ambulate ~25 ft w/o AD with only slighly slower gait.  May trial with [facility] next PT session as he did well with walker.  Stairs            Wheelchair Mobility    Modified Rankin (Stroke Patients Only)       Balance Overall balance assessment: Modified Independent                                           Pertinent Vitals/Pain Pain Assessment: No/denies pain    Home Living Family/patient expects to be discharged to:: Private residence Living Arrangements: Spouse/significant other Available Help at Discharge: Personal care attendant(caregiver 9-5 QD)   Home Access: Ramped entrance       Home Equipment: [place] - 2 wheels;Cane - single point;Bedside commode;Tub bench;Hand held shower head;Crutches      Prior Function Level of Independence: Independent with assistive device(s)         Comments: reports he uses SPC most of the time, occasionally RW.  Able to get out of the home a few times a week (pt does not drive).  He does reports ~12 falls in the last 6 months (usually while helping his w/c bound wife?)     Hand Dominance        Extremity/Trunk Assessment  Upper Extremity Assessment Upper Extremity Assessment: Overall WFL for tasks assessed(minimal chronic R sided weakness, functional t/o)    Lower Extremity Assessment Lower Extremity Assessment: Overall WFL for tasks assessed(minimal chronic R sided defecits, WFL t/o )       Communication   Communication: No difficulties  Cognition Arousal/Alertness: Awake/alert Behavior During Therapy: WFL for tasks assessed/performed Overall Cognitive Status: Within Functional Limits for tasks assessed                                        General Comments      Exercises     Assessment/Plan    PT Assessment Patient needs continued PT services  PT Problem List Decreased strength;Decreased range of  motion;Decreased activity tolerance;Decreased balance;Decreased mobility;Decreased coordination;Decreased cognition;Decreased knowledge of use of DME;Decreased safety awareness;Cardiopulmonary status limiting activity       PT Treatment Interventions DME instruction;Gait training;Stair training;Functional mobility training;Therapeutic activities;Therapeutic exercise;Balance training;Neuromuscular re-education;Patient/family education    PT Goals (Current goals can be found in the Care Plan section)  Acute Rehab PT Goals Patient Stated Goal: go home PT Goal Formulation: With patient Time For Goal Achievement: 02/11/19 Potential to Achieve Goals: Good    Frequency Min 2X/week   Barriers to discharge        Co-evaluation               AM-PAC PT "6 Clicks" Mobility  Outcome Measure Help needed turning from your back to your side while in a flat bed without using bedrails?: None Help needed moving from lying on your back to sitting on the side of a flat bed without using bedrails?: None Help needed moving to and from a bed to a chair (including a wheelchair)?: None Help needed standing up from a chair using your arms (e.g., wheelchair or bedside chair)?: A Little Help needed to walk in hospital room?: A Little Help needed climbing 3-5 steps with a railing? : A Little 6 Click Score: 21    End of Session Equipment Utilized During Treatment: Gait belt Activity Tolerance: Patient tolerated treatment well Patient left: with call bell/phone within reach;with chair alarm set Nurse Communication: Mobility status PT Visit Diagnosis: Muscle weakness (generalized) (M62.81);Difficulty in walking, not elsewhere classified (R26.2)    Time: 3845-3646 PT Time Calculation (min) (ACUTE ONLY): 24 min   Charges:   PT Evaluation $PT Eval Low Complexity: 1 Low PT Treatments $Gait Training: 8-22 mins        Kreg Shropshire, DPT 01/28/2019, 5:15 PM

## 2019-01-28 NOTE — Progress Notes (Signed)
[facility] [place] NAME: Phillip Reyes    MR#:  [id number]  DATE OF BIRTH:  March 30, 1941  SUBJECTIVE:   Patient presented to the hospital due to nausea vomiting and diarrhea but also incidentally noted to have pneumonia.  Diarrhea has resolved, patient denies any nausea vomiting.  Denies any worsening cough congestion.  REVIEW OF SYSTEMS:    Review of Systems  Constitutional: Negative for chills and fever.  HENT: Negative for congestion and tinnitus.   Eyes: Negative for blurred vision and double vision.  Respiratory: Positive for shortness of breath. Negative for cough and wheezing.   Cardiovascular: Negative for chest pain, orthopnea and PND.  Gastrointestinal: Negative for abdominal pain, diarrhea, nausea and vomiting.  Genitourinary: Negative for dysuria and hematuria.  Neurological: Positive for weakness. Negative for dizziness, sensory change and focal weakness.  All other systems reviewed and are negative.   Nutrition: Heart Healthy/Carb control.  Tolerating Diet: Yes Tolerating PT: Await Eval.   DRUG ALLERGIES:   Allergies  Allergen Reactions  . Norco [Hydrocodone-Acetaminophen] Shortness Of Breath    VITALS:  Blood pressure 138/67, pulse (!) 59, temperature 97.7 F (36.5 C), temperature source Oral, resp. rate 20, height 5\' 8"  (1.727 m), weight 73.9 kg, SpO2 97 %.  PHYSICAL EXAMINATION:   Physical Exam  GENERAL:  78 y.o.-year-old patient lying in bed in no acute distress.  EYES: Pupils equal, round, reactive to light and accommodation. No scleral icterus. Extraocular muscles intact.  HEENT: Head atraumatic, normocephalic. Oropharynx and nasopharynx clear.  NECK:  Supple, no jugular venous distention. No thyroid enlargement, no tenderness.  LUNGS: Prolonged inspiratory and expiratory phase, negative suggested muscles.  No rhonchi rales or wheezing.  CARDIOVASCULAR: S1, S2 normal. No murmurs, rubs, or gallops.   ABDOMEN: Soft, nontender, nondistended. Bowel sounds present. No organomegaly or mass.  EXTREMITIES: No cyanosis, clubbing or edema b/l.    NEUROLOGIC: Cranial nerves II through XII are intact. No focal Motor or sensory deficits b/l.   PSYCHIATRIC: The patient is alert and oriented x 3.  SKIN: No obvious rash, lesion, or ulcer.    LABORATORY PANEL:   CBC Recent Labs  Lab 01/27/19 1453  WBC 7.0  HGB 12.0*  HCT 36.4*  PLT 197   ------------------------------------------------------------------------------------------------------------------  Chemistries  Recent Labs  Lab 01/27/19 1453 01/28/19 1014  NA 141 140  K 4.3 3.4*  CL 104 107  CO2 27 22  GLUCOSE 219* 250*  BUN 33* 24*  CREATININE 2.21* 2.06*  CALCIUM 9.3 8.5*  MG 2.1  --   AST 18  --   ALT 26  --   ALKPHOS 78  --   BILITOT 0.8  --    ------------------------------------------------------------------------------------------------------------------  Cardiac Enzymes No results for input(s): TROPONINI in the last 168 hours. ------------------------------------------------------------------------------------------------------------------  RADIOLOGY:  Dg Chest 1 View  Result Date: 01/27/2019 CLINICAL DATA:  Shortness of breath. Generalized weakness. Nausea and diarrhea. EXAM: CHEST  1 VIEW COMPARISON:  Two-view chest x-ray 04/09/2018. FINDINGS: The heart size is normal. Lung volumes are low. Ill-defined airspace opacities are present in the upper lobes bilaterally. There is no significant consolidation. Small effusions may be present. IMPRESSION: 1. Ill-defined airspace opacities in the upper lobes bilaterally. Given underlying interstitial coarsening, this could represent edema, but infection is suspected. 2. Small effusions. 3. Low lung volumes. Electronically Signed   By: San Morelle M.D.   On: 01/27/2019 15:35   US Renal  Result Date: 01/28/2019 CLINICAL DATA:  Acute kidney injury. EXAM: RENAL /  URINARY TRACT ULTRASOUND COMPLETE COMPARISON:  CT abdomen pelvis dated November 23, 2015. FINDINGS: Right Kidney: Renal measurements: 9.3 x 4.4 x 4.4 cm = volume: 93 mL . Echogenicity within normal limits. No mass or hydronephrosis visualized. Left Kidney: Renal measurements: 10.0 x 3.8 x 4.2 cm = volume: 84 mL. Increased echogenicity. No mass or hydronephrosis visualized. Bladder: Appears normal for degree of bladder distention. IMPRESSION: 1. Increased echogenicity of the left kidney, nonspecific, but consistent with underlying medical renal disease. Electronically Signed   By: Titus Dubin M.D.   On: 01/28/2019 09:58     ASSESSMENT AND PLAN:   78 year old male with past medical history of COPD, diabetes, hypertension, history of previous CVA, osteoarthritis who presented to the hospital due to nausea vomiting diarrhea and shortness of breath.   ** Community-acquired pneumonia- chest x-ray with bilateral upper lobe opacities and patient endorses shortness of breath and cough productive of yellow sputum.  No signs of sepsis on admission. -COVID test negative -Continue ceftriaxone and azithromycin -Respiratory panel still pending.  Procalcitonin level was normal.  Clinically patient does not appear to be in congestive heart failure. -Follow cultures.  Clinically improving.  **Diarrhea- may be due to a viral gastroenteritis.  -Resolved, will DC enteric precautions as patient has had no diarrhea overnight.  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 Baseline creatinine is 1.5-1.8. -Continue gentle IV fluids, creatinine down to 2.0 today.  We will continue to monitor.  Renal ultrasound structuring. -Continue to hold lisinopril for now.  ** Generalized weakness-may be due to above - await PT consult.   ** Elevated lipase- patient denies any abdominal pain, so doubt acute pancreatitis. - no acute symptoms.   ** Hypertension- BP mildly elevated in the ED -  Continue home Norvasc, doxazosin  ** Type 2 diabetes- blood sugar elevated in the ED -Continue Lantus, sliding scale insulin.  Follow blood sugars.  ** Hyperlipidemia- stable -Continue home Lipitor  ** Depression- stable -Continue home Celexa     All the records are reviewed and case discussed with Care Management/Social Worker. Management plans discussed with the patient, family and they are in agreement.  CODE STATUS: DNR  DVT Prophylaxis: Lovenox  TOTAL TIME TAKING CARE OF THIS PATIENT: 30 minutes.   POSSIBLE D/C IN 2-3 DAYS, DEPENDING ON CLINICAL CONDITION.   Henreitta Leber M.D on 01/28/2019 at 1:27 PM  Between 7am to 6pm - Pager - [phone]  After 6pm go to [email or web address] - [facility]  Office  [phone]  CC: Primary care physician; [facility], [place]

## 2019-01-29 LAB — GLUCOSE, CAPILLARY
Glucose-Capillary: 95 mg/dL (ref 70–99)
Glucose-Capillary: 173 mg/dL — ABNORMAL HIGH (ref 70–99)

## 2019-01-29 LAB — BASIC METABOLIC PANEL
Anion gap: 8 (ref 5–15)
BUN: 22 mg/dL (ref 8–23)
CO2: 24 mmol/L (ref 22–32)
Calcium: 8.6 mg/dL — ABNORMAL LOW (ref 8.9–10.3)
Chloride: 111 mmol/L (ref 98–111)
Creatinine, Ser: 1.9 mg/dL — ABNORMAL HIGH (ref 0.61–1.24)
GFR calc Af Amer: 39 mL/min — ABNORMAL LOW (ref >60)
GFR calc non Af Amer: 33 mL/min — ABNORMAL LOW (ref >60)
Glucose, Bld: 95 mg/dL (ref 70–99)
Potassium: 3.9 mmol/L (ref 3.5–5.1)
Sodium: 143 mmol/L (ref 135–145)

## 2019-01-29 MED ORDER — DOXYCYCLINE HYCLATE 100 MG PO TABS: 100.0000 mg | ORAL_TABLET | Freq: Two times a day (BID) | ORAL | 0 refills | Status: AC

## 2019-01-29 MED ORDER — ENOXAPARIN SODIUM 40 MG/0.4ML ~~LOC~~ SOLN
40.0000 mg | SUBCUTANEOUS | Status: DC
  Administered 2019-01-29: 40 mg via SUBCUTANEOUS
  Filled 2019-01-29: qty 0.4

## 2019-01-29 MED ORDER — AZITHROMYCIN 250 MG PO TABS: 500.0000 mg | ORAL_TABLET | Freq: Every day | ORAL | Status: DC

## 2019-01-29 MED ORDER — IPRATROPIUM-ALBUTEROL 0.5-2.5 (3) MG/3ML IN SOLN: 3.0000 mL | Freq: Two times a day (BID) | RESPIRATORY_TRACT | Status: DC

## 2019-01-29 NOTE — Discharge Instructions (Signed)
Community-Acquired Pneumonia, Adult °Pneumonia is an infection of the lungs. It causes swelling in the airways of the lungs. Mucus and fluid may also build up inside the airways. °One type of pneumonia can happen while a person is in a hospital. A different type can happen when a person is not in a hospital (community-acquired pneumonia).  °What are the causes? ° °This condition is caused by germs (viruses, bacteria, or fungi). Some types of germs can be passed from one person to another. This can happen when you breathe in droplets from the cough or sneeze of an infected person. °What increases the risk? °You are more likely to develop this condition if you: °· Have a long-term (chronic) disease, such as: °? Chronic obstructive pulmonary disease (COPD). °? Asthma. °? Cystic fibrosis. °? Congestive heart failure. °? Diabetes. °? Kidney disease. °· Have HIV. °· Have sickle cell disease. °· Have had your spleen removed. °· Do not take good care of your teeth and mouth (poor dental hygiene). °· Have a medical condition that increases the risk of breathing in droplets from your own mouth and nose. °· Have a weakened body defense system (immune system). °· Are a smoker. °· Travel to areas where the germs that cause this illness are common. °· Are around certain animals or the places they live. °What are the signs or symptoms? °· A dry cough. °· A wet (productive) cough. °· Fever. °· Sweating. °· Chest pain. This often happens when breathing deeply or coughing. °· Fast breathing or trouble breathing. °· Shortness of breath. °· Shaking chills. °· Feeling tired (fatigue). °· Muscle aches. °How is this treated? °Treatment for this condition depends on many things. Most adults can be treated at home. In some cases, treatment must happen in a hospital. Treatment may include: °· Medicines given by mouth or through an IV tube. °· Being given extra oxygen. °· Respiratory therapy. °In rare cases, treatment for very bad pneumonia  may include: °· Using a machine to help you breathe. °· Having a procedure to remove fluid from around your lungs. °Follow these instructions at home: °Medicines °· Take over-the-counter and prescription medicines only as told by your doctor. °? Only take cough medicine if you are losing sleep. °· If you were prescribed an antibiotic medicine, take it as told by your doctor. Do not stop taking the antibiotic even if you start to feel better. °General instructions ° °· Sleep with your head and neck raised (elevated). You can do this by sleeping in a recliner or by putting a few pillows under your head. °· Rest as needed. Get at least 8 hours of sleep each night. °· Drink enough water to keep your pee (urine) pale yellow. °· Eat a healthy diet that includes plenty of vegetables, fruits, whole grains, low-fat dairy products, and lean protein. °· Do not use any products that contain nicotine or tobacco. These include cigarettes, e-cigarettes, and chewing tobacco. If you need help quitting, ask your doctor. °· Keep all follow-up visits as told by your doctor. This is important. °How is this prevented? °A shot (vaccine) can help prevent pneumonia. Shots are often suggested for: °· People older than 78 years of age. °· People older than 78 years of age who: °? Are having cancer treatment. °? Have long-term (chronic) lung disease. °? Have problems with their body's defense system. °You may also prevent pneumonia if you take these actions: °· Get the flu (influenza) shot every year. °· Go to the dentist as   often as told. °· Wash your hands often. If you cannot use soap and water, use hand sanitizer. °Contact a doctor if: °· You have a fever. °· You lose sleep because your cough medicine does not help. °Get help right away if: °· You are short of breath and it gets worse. °· You have more chest pain. °· Your sickness gets worse. This is very serious if: °? You are an older adult. °? Your body's defense system is weak. °· You  cough up blood. °Summary °· Pneumonia is an infection of the lungs. °· Most adults can be treated at home. Some will need treatment in a hospital. °· Drink enough water to keep your pee pale yellow. °· Get at least 8 hours of sleep each night. °This information is not intended to replace advice given to you by your health care provider. Make sure you discuss any questions you have with your health care provider. °Document Released: 10/11/2007 Document Revised: 08/14/2018 Document Reviewed: 12/20/2017 °Elsevier Patient Education © 2020 [employer] ° °

## 2019-01-29 NOTE — Progress Notes (Signed)
PHARMACIST - PHYSICIAN COMMUNICATION  CONCERNING:  Enoxaparin (Lovenox) for DVT Prophylaxis    RECOMMENDATION: Patient was prescribed enoxaprin 30mg  q24 hours for VTE prophylaxis.   Filed Weights   01/27/19 1436  Weight: 163 lb (73.9 kg)    Body mass index is 24.78 kg/m.  Estimated Creatinine Clearance: 31.5 mL/min (A) (by C-G formula based on SCr of 1.9 mg/dL (H)).  Patient is candidate for enoxaparin 40mg  every 24 hours based on CrCl >36ml/min AND Weight > 45kg   DESCRIPTION: Pharmacy has adjusted enoxaparin dose per [facility] policy.  Patient is now receiving enoxaparin 40mg  every 24 hours.   Lu Duffel, PharmD, BCPS Clinical Pharmacist 01/29/2019 8:33 AM

## 2019-01-29 NOTE — Progress Notes (Signed)
PHARMACIST - PHYSICIAN COMMUNICATION DR:   Verdell Carmine CONCERNING: Antibiotic IV to Oral Route Change Policy  RECOMMENDATION: This patient is receiving Azithromycin by the intravenous route.  Based on criteria approved by the Pharmacy and Therapeutics Committee, the antibiotic(s) is/are being converted to the equivalent oral dose form(s).   DESCRIPTION: These criteria include:  Patient being treated for a respiratory tract infection, urinary tract infection, cellulitis or clostridium difficile associated diarrhea if on metronidazole  The patient is not neutropenic and does not exhibit a GI malabsorption state  The patient is eating (either orally or via tube) and/or has been taking other orally administered medications for a least 24 hours  The patient is improving clinically and has a Tmax < 100.5  If you have questions about this conversion, please contact the [place], PharmD, BCPS Clinical Pharmacist 01/29/2019 9:06 AM

## 2019-01-29 NOTE — Discharge Summary (Signed)
[facility] [place] NAME: Phillip Reyes    MR#:  [id number]  DATE OF BIRTH:  July 14, 1940  DATE OF ADMISSION:  01/27/2019 ADMITTING PHYSICIAN: Sela Hua, MD  DATE OF DISCHARGE: 01/29/2019  PRIMARY CARE PHYSICIAN: [facility]    ADMISSION DIAGNOSIS:  AKI (acute kidney injury) ([place]) [N17.9] Pneumonia of both upper lobes due to infectious organism ([place]) [J18.1]  DISCHARGE DIAGNOSIS:  Active Problems:   CAP (community acquired pneumonia)   SECONDARY DIAGNOSIS:   Past Medical History:  Diagnosis Date  . Arthritis   . Cancer ([place])   . COPD (chronic obstructive pulmonary disease) ([place])   . Diabetes mellitus without complication ([place])   . Hypertension   . Renal disorder   . Stroke [facility])     HOSPITAL COURSE:   78 year old male with past medical history of COPD, diabetes, hypertension, history of previous CVA, osteoarthritis who presented to the hospital due to nausea vomiting diarrhea and shortness of breath.   ** Community-acquired pneumonia-chest x-ray with bilateral upper lobe opacities and patient endorses shortness of breath and cough productive of yellow sputum. No signs of sepsis on admission. -COVID test  Was negative. Pt. Was treated with ceftriaxone and azithromycin while in the hospital.  -Patient's procalcitonin level was normal, respiratory panel was negative.  -With IV antibiotics patient has improved, and therefore being discharged on oral doxycycline for additional few more days.  **Diarrhea-may be due to a viral gastroenteritis.  -This has resolved while the patient was in the hospital.  He had no further diarrhea, enteric precautions were discontinued and patient was not able to give [place] a stool sample.  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 Baseline creatinine is 1.5-1.8. Patient presented to the hospital with a creatinine over 2.0 and after IV fluid hydration  creatinine has come down to baseline around 1.9. -Renal ultrasound was negative for obstruction.  Patient will resume his lisinopril upon discharge.  ** Generalized weakness-may be due to above -Seen by physical therapy and the recommend home health services which is being arranged for the patient prior to discharge.  ** Elevated lipase-this was likely chemical pancreatitis, patient had no clinical symptoms.  ** Hypertension- BP stable while in the hospital.  Patient will continue his doxazosin, lisinopril, Norvasc.  ** Type 2 diabetes-blood sugars remained stable while in the hospital.  Patient will resume his lispro with meals and Lantus upon discharge.  ** Hyperlipidemia-continue Lipitor  ** Depression-continue Celexa  DISCHARGE CONDITIONS:   Stable.   CONSULTS OBTAINED:    DRUG ALLERGIES:   Allergies  Allergen Reactions  . Norco [Hydrocodone-Acetaminophen] Shortness Of Breath    DISCHARGE MEDICATIONS:   Allergies as of 01/29/2019      Reactions   Norco [hydrocodone-acetaminophen] Shortness Of Breath      Medication List    TAKE these medications   acetaminophen 325 MG tablet Commonly known as: TYLENOL Take 650 mg by mouth every 6 (six) hours as needed.   albuterol (2.5 MG/3ML) 0.083% nebulizer solution Commonly known as: PROVENTIL Take 2.5 mg by nebulization every 6 (six) hours as needed for wheezing or shortness of breath.   amLODipine 10 MG tablet Commonly known as: NORVASC Take 10 mg by mouth daily.   aspirin EC 81 MG tablet Take 81 mg by mouth daily.   atorvastatin 80 MG tablet Commonly known as: LIPITOR Take 80 mg by mouth at bedtime.   budesonide-formoterol 160-4.5  MCG/ACT inhaler Commonly known as: SYMBICORT Inhale 2 puffs into the lungs 2 (two) times daily.   citalopram 40 MG tablet Commonly known as: CELEXA Take 20 mg by mouth daily.   doxazosin 4 MG tablet Commonly known as: CARDURA Take 4 mg by mouth at bedtime.   doxycycline  100 MG tablet Commonly known as: VIBRA-TABS Take 1 tablet (100 mg total) by mouth 2 (two) times daily for 6 days.   gabapentin 400 MG capsule Commonly known as: NEURONTIN Take 400 mg by mouth daily.   insulin glargine 100 UNIT/ML injection Commonly known as: LANTUS Inject 55 Units into the skin at bedtime.   insulin lispro 100 UNIT/ML injection Commonly known as: HUMALOG Inject 12 Units into the skin 3 (three) times daily with meals. Use sliding scale to increase amount of insulin needed based on blood sugar.   ipratropium-albuterol 0.5-2.5 (3) MG/3ML Soln Commonly known as: DUONEB Take 3 mLs by nebulization every 6 (six) hours as needed.   lisinopril 5 MG tablet Commonly known as: ZESTRIL Take 2.5 mg by mouth daily.   omeprazole 20 MG capsule Commonly known as: PRILOSEC Take 20 mg by mouth daily.   vitamin B-12 1000 MCG tablet Commonly known as: CYANOCOBALAMIN Take 1,000 mcg by mouth daily.   Vitamin D (Ergocalciferol) 1.25 MG (50000 UT) Caps capsule Commonly known as: DRISDOL Take 50,000 Units by mouth every 7 (seven) days.         DISCHARGE INSTRUCTIONS:   DIET:  Cardiac diet and Diabetic diet  DISCHARGE CONDITION:  Stable  ACTIVITY:  Activity as tolerated  OXYGEN:  Home Oxygen: No.   Oxygen Delivery: room air  DISCHARGE LOCATION:  Home with Home Health PT.    If you experience worsening of your admission symptoms, develop shortness of breath, life threatening emergency, suicidal or homicidal thoughts you must seek medical attention immediately by calling 911 or calling your MD immediately  if symptoms less severe.  You Must read complete instructions/literature along with all the possible adverse reactions/side effects for all the Medicines you take and that have been prescribed to you. Take any new Medicines after you have completely understood and accpet all the possible adverse reactions/side effects.   Please note  You were cared for by a  hospitalist during your hospital stay. If you have any questions about your discharge medications or the care you received while you were in the hospital after you are discharged, you can call the unit and asked to speak with the hospitalist on call if the hospitalist that took care of you is not available. Once you are discharged, your primary care physician will handle any further medical issues. Please note that NO REFILLS for any discharge medications will be authorized once you are discharged, as it is imperative that you return to your primary care physician (or establish a relationship with a primary care physician if you do not have one) for your aftercare needs so that they can reassess your need for medications and monitor your lab values.     Today   No acute events overnight, diarrhea has resolved.  Patient overall feels a lot better.  Will discharge home with home health physical therapy today.  VITAL SIGNS:  Blood pressure (!) 146/59, pulse 70, temperature 98 F (36.7 C), temperature source Oral, resp. rate 20, height 5\' 8"  (1.727 m), weight 73.9 kg, SpO2 99 %.  I/O:    Intake/Output Summary (Last 24 hours) at 01/29/2019 1450 Last data filed at 01/29/2019 1145  Gross per 24 hour  Intake 2061.25 ml  Output 1300 ml  Net 761.25 ml    PHYSICAL EXAMINATION:   GENERAL:  78 y.o.-year-old patient lying in bed in no acute distress.  EYES: Pupils equal, round, reactive to light and accommodation. No scleral icterus. Extraocular muscles intact.  HEENT: Head atraumatic, normocephalic. Oropharynx and nasopharynx clear.  NECK:  Supple, no jugular venous distention. No thyroid enlargement, no tenderness.  LUNGS: Prolonged inspiratory and expiratory phase, negative use of accessory muscles.  No rhonchi rales or wheezing.  CARDIOVASCULAR: S1, S2 normal. No murmurs, rubs, or gallops.  ABDOMEN: Soft, nontender, nondistended. Bowel sounds present. No organomegaly or mass.  EXTREMITIES: No  cyanosis, clubbing or edema b/l.    NEUROLOGIC: Cranial nerves II through XII are intact. No focal Motor or sensory deficits b/l.   PSYCHIATRIC: The patient is alert and oriented x 3.  SKIN: No obvious rash, lesion, or ulcer.   DATA REVIEW:   CBC Recent Labs  Lab 01/27/19 1453  WBC 7.0  HGB 12.0*  HCT 36.4*  PLT 197    Chemistries  Recent Labs  Lab 01/27/19 1453  01/29/19 0414  NA 141   < > 143  K 4.3   < > 3.9  CL 104   < > 111  CO2 27   < > 24  GLUCOSE 219*   < > 95  BUN 33*   < > 22  CREATININE 2.21*   < > 1.90*  CALCIUM 9.3   < > 8.6*  MG 2.1  --   --   AST 18  --   --   ALT 26  --   --   ALKPHOS 78  --   --   BILITOT 0.8  --   --    < > = values in this interval not displayed.    Cardiac Enzymes No results for input(s): TROPONINI in the last 168 hours.  Microbiology Results  Results for orders placed or performed during the hospital encounter of 01/27/19  SARS Coronavirus 2 [facility] order, Performed in [facility] hospital lab) Nasopharyngeal Nasopharyngeal Swab     Status: None   Collection Time: 01/27/19  3:09 PM   Specimen: Nasopharyngeal Swab  Result Value Ref Range Status   SARS Coronavirus 2 NEGATIVE NEGATIVE Final    Comment: (NOTE) If result is NEGATIVE SARS-CoV-2 target nucleic acids are NOT DETECTED. The SARS-CoV-2 RNA is generally detectable in upper and lower  respiratory specimens during the acute phase of infection. The lowest  concentration of SARS-CoV-2 viral copies this assay can detect is 250  copies / mL. A negative result does not preclude SARS-CoV-2 infection  and should not be used as the sole basis for treatment or other  patient management decisions.  A negative result may occur with  improper specimen collection / handling, submission of specimen other  than nasopharyngeal swab, presence of viral mutation(s) within the  areas targeted by this assay, and inadequate number of viral copies  (<250 copies / mL). A negative result  must be combined with clinical  observations, patient history, and epidemiological information. If result is POSITIVE SARS-CoV-2 target nucleic acids are DETECTED. The SARS-CoV-2 RNA is generally detectable in upper and lower  respiratory specimens dur ing the acute phase of infection.  Positive  results are indicative of active infection with SARS-CoV-2.  Clinical  correlation with patient history and other diagnostic information is  necessary to determine patient infection status.  Positive results do  not rule out bacterial infection or co-infection with other viruses. If result is PRESUMPTIVE POSTIVE SARS-CoV-2 nucleic acids MAY BE PRESENT.   A presumptive positive result was obtained on the submitted specimen  and confirmed on repeat testing.  While 2019 novel coronavirus  (SARS-CoV-2) nucleic acids may be present in the submitted sample  additional confirmatory testing may be necessary for epidemiological  and / or clinical management purposes  to differentiate between  SARS-CoV-2 and other Sarbecovirus currently known to infect humans.  If clinically indicated additional testing with an alternate test  methodology [phone]) is advised. The SARS-CoV-2 RNA is generally  detectable in upper and lower respiratory sp ecimens during the acute  phase of infection. The expected result is Negative. Fact Sheet for Patients:  StrictlyIdeas.no Fact Sheet for Healthcare Providers: BankingDealers.co.za This test is not yet approved or cleared by the [place] FDA and has been authorized for detection and/or diagnosis of SARS-CoV-2 by FDA under an Emergency Use Authorization (EUA).  This EUA will remain in effect (meaning this test can be used) for the duration of the COVID-19 declaration under Section 564(b)(1) of the Act, 21 U.S.C. section 360bbb-3(b)(1), unless the authorization is terminated or revoked sooner. Performed at [facility], [address] [place], [place] [address]   Respiratory Panel by PCR     Status: None   Collection Time: 01/28/19 10:24 AM   Specimen: Nasopharyngeal Swab; Respiratory  Result Value Ref Range Status   Adenovirus NOT DETECTED NOT DETECTED Final   Coronavirus 229E NOT DETECTED NOT DETECTED Final    Comment: (NOTE) The Coronavirus on the Respiratory Panel, DOES NOT test for the novel  Coronavirus (2019 nCoV)    Coronavirus HKU1 NOT DETECTED NOT DETECTED Final   Coronavirus NL63 NOT DETECTED NOT DETECTED Final   Coronavirus OC43 NOT DETECTED NOT DETECTED Final   Metapneumovirus NOT DETECTED NOT DETECTED Final   Rhinovirus / Enterovirus NOT DETECTED NOT DETECTED Final   Influenza A NOT DETECTED NOT DETECTED Final   Influenza B NOT DETECTED NOT DETECTED Final   Parainfluenza Virus 1 NOT DETECTED NOT DETECTED Final   Parainfluenza Virus 2 NOT DETECTED NOT DETECTED Final   Parainfluenza Virus 3 NOT DETECTED NOT DETECTED Final   Parainfluenza Virus 4 NOT DETECTED NOT DETECTED Final   Respiratory Syncytial Virus NOT DETECTED NOT DETECTED Final   Bordetella pertussis NOT DETECTED NOT DETECTED Final   Chlamydophila pneumoniae NOT DETECTED NOT DETECTED Final   Mycoplasma pneumoniae NOT DETECTED NOT DETECTED Final    Comment: Performed at [facility], [place]. [address], [place], [place] [address]    RADIOLOGY:  Dg Chest 1 View  Result Date: 01/27/2019 CLINICAL DATA:  Shortness of breath. Generalized weakness. Nausea and diarrhea. EXAM: CHEST  1 VIEW COMPARISON:  Two-view chest x-ray 04/09/2018. FINDINGS: The heart size is normal. Lung volumes are low. Ill-defined airspace opacities are present in the upper lobes bilaterally. There is no significant consolidation. Small effusions may be present. IMPRESSION: 1. Ill-defined airspace opacities in the upper lobes bilaterally. Given underlying interstitial coarsening, this could represent edema, but infection is suspected. 2. Small  effusions. 3. Low lung volumes. Electronically Signed   By: San Morelle M.D.   On: 01/27/2019 15:35   US Renal  Result Date: 01/28/2019 CLINICAL DATA:  Acute kidney injury. EXAM: RENAL / URINARY TRACT ULTRASOUND COMPLETE COMPARISON:  CT abdomen pelvis dated November 23, 2015. FINDINGS: Right Kidney: Renal measurements: 9.3 x 4.4 x 4.4 cm = volume: 93  mL . Echogenicity within normal limits. No mass or hydronephrosis visualized. Left Kidney: Renal measurements: 10.0 x 3.8 x 4.2 cm = volume: 84 mL. Increased echogenicity. No mass or hydronephrosis visualized. Bladder: Appears normal for degree of bladder distention. IMPRESSION: 1. Increased echogenicity of the left kidney, nonspecific, but consistent with underlying medical renal disease. Electronically Signed   By: Titus Dubin M.D.   On: 01/28/2019 09:58      Management plans discussed with the patient, family and they are in agreement.  CODE STATUS:     Code Status Orders  (From admission, onward)         Start     Ordered   01/28/19 1336  Do not attempt resuscitation (DNR)  Continuous    Question Answer Comment  In the event of cardiac or respiratory ARREST Do not call a "code blue"   In the event of cardiac or respiratory ARREST Do not perform Intubation, CPR, defibrillation or ACLS   In the event of cardiac or respiratory ARREST Use medication by any route, position, wound care, and other measures to relive pain and suffering. May use oxygen, suction and manual treatment of airway obstruction as needed for comfort.      01/28/19 1335          TOTAL TIME TAKING CARE OF THIS PATIENT: 40 minutes.    Henreitta Leber M.D on 01/29/2019 at 2:50 PM  Between 7am to 6pm - Pager - [phone]  After 6pm go to [email or web address] - [facility]  Office  [phone]  CC: Primary care physician; [facility] [place]

## 2019-01-29 NOTE — TOC Transition Note (Signed)
Transition of Care [facility]) - CM/SW Discharge Note   Patient Details  Name: Phillip Reyes MRN: [id number] Date of Birth: [birth year]  [facility] CM/SW Contact:  Annamaria Boots, [place] Phone Number: 01/29/2019, 1:31 PM   Clinical Narrative:  Patient medically ready for discharge today. Per Therapy, patient will need home health PT. CSW spoke with patient's daughter and she states that patient already has home health services provided by the [place] and she has already contacted them. Patient also has sitter 5 days a week. Daughter states that they do not need any orders or DME. CSW signed off.      Final next level of care: Home w Home Health Services Barriers to Discharge: No Barriers Identified   Patient Goals and CMS Choice   CMS Medicare.gov Compare Post Acute Care list provided to:: Patient Represenative (must comment)(Daughter) Choice offered to / list presented to : Adult Children  Discharge Placement                       Discharge Plan and Services                          HH Arranged: PT [place] Agency: Other - See comment([facility])        Social Determinants of Health (SDOH) Interventions     Readmission Risk Interventions No flowsheet data found.

## 2019-01-29 NOTE — Progress Notes (Signed)
Patient was given all discharge information. Using teach back method, he showed understanding of when to take medication, his follow-up appointments, and when to call the doctor. His condition is stable and his IV was removed. Patient tolerated procedure well. He was taken to the front by one of our staff.

## 2019-11-08 ENCOUNTER — Inpatient Hospital Stay
Admission: EM | Admit: 2019-11-08 | Discharge: 2019-11-11 | DRG: 194 | Disposition: A | Discharge status: Home Health | Payer: No Typology Code available for payment source | Attending: Internal Medicine | Admitting: Internal Medicine

## 2019-11-08 ENCOUNTER — Emergency Department: Payer: No Typology Code available for payment source

## 2019-11-08 ENCOUNTER — Other Ambulatory Visit: Payer: Self-pay

## 2019-11-08 DIAGNOSIS — Z7982 Long term (current) use of aspirin: Secondary | ICD-10-CM

## 2019-11-08 DIAGNOSIS — J189 Pneumonia, unspecified organism: Principal | ICD-10-CM | POA: Diagnosis present

## 2019-11-08 DIAGNOSIS — F329 Major depressive disorder, single episode, unspecified: Secondary | ICD-10-CM | POA: Diagnosis present

## 2019-11-08 DIAGNOSIS — Z885 Allergy status to narcotic agent status: Secondary | ICD-10-CM

## 2019-11-08 DIAGNOSIS — R9431 Abnormal electrocardiogram [ECG] [EKG]: Secondary | ICD-10-CM | POA: Diagnosis present

## 2019-11-08 DIAGNOSIS — R4182 Altered mental status, unspecified: Secondary | ICD-10-CM | POA: Diagnosis present

## 2019-11-08 DIAGNOSIS — Z79899 Other long term (current) drug therapy: Secondary | ICD-10-CM

## 2019-11-08 DIAGNOSIS — N1832 Chronic kidney disease, stage 3b: Secondary | ICD-10-CM | POA: Diagnosis present

## 2019-11-08 DIAGNOSIS — N179 Acute kidney failure, unspecified: Secondary | ICD-10-CM | POA: Diagnosis present

## 2019-11-08 DIAGNOSIS — Z794 Long term (current) use of insulin: Secondary | ICD-10-CM

## 2019-11-08 DIAGNOSIS — R300 Dysuria: Secondary | ICD-10-CM | POA: Diagnosis present

## 2019-11-08 DIAGNOSIS — Z7951 Long term (current) use of inhaled steroids: Secondary | ICD-10-CM

## 2019-11-08 DIAGNOSIS — Z8249 Family history of ischemic heart disease and other diseases of the circulatory system: Secondary | ICD-10-CM

## 2019-11-08 DIAGNOSIS — J440 Chronic obstructive pulmonary disease with acute lower respiratory infection: Secondary | ICD-10-CM | POA: Diagnosis present

## 2019-11-08 DIAGNOSIS — R42 Dizziness and giddiness: Secondary | ICD-10-CM | POA: Diagnosis present

## 2019-11-08 DIAGNOSIS — E1122 Type 2 diabetes mellitus with diabetic chronic kidney disease: Secondary | ICD-10-CM | POA: Diagnosis present

## 2019-11-08 DIAGNOSIS — Z8673 Personal history of transient ischemic attack (TIA), and cerebral infarction without residual deficits: Secondary | ICD-10-CM

## 2019-11-08 DIAGNOSIS — Z66 Do not resuscitate: Secondary | ICD-10-CM | POA: Diagnosis present

## 2019-11-08 DIAGNOSIS — D649 Anemia, unspecified: Secondary | ICD-10-CM | POA: Diagnosis present

## 2019-11-08 DIAGNOSIS — R062 Wheezing: Secondary | ICD-10-CM | POA: Diagnosis present

## 2019-11-08 DIAGNOSIS — I129 Hypertensive chronic kidney disease with stage 1 through stage 4 chronic kidney disease, or unspecified chronic kidney disease: Secondary | ICD-10-CM | POA: Diagnosis present

## 2019-11-08 DIAGNOSIS — E1151 Type 2 diabetes mellitus with diabetic peripheral angiopathy without gangrene: Secondary | ICD-10-CM | POA: Diagnosis present

## 2019-11-08 DIAGNOSIS — Z20822 Contact with and (suspected) exposure to covid-19: Secondary | ICD-10-CM | POA: Diagnosis present

## 2019-11-08 DIAGNOSIS — R001 Bradycardia, unspecified: Secondary | ICD-10-CM | POA: Diagnosis present

## 2019-11-08 DIAGNOSIS — M1990 Unspecified osteoarthritis, unspecified site: Secondary | ICD-10-CM | POA: Diagnosis present

## 2019-11-08 DIAGNOSIS — F17210 Nicotine dependence, cigarettes, uncomplicated: Secondary | ICD-10-CM | POA: Diagnosis present

## 2019-11-08 DIAGNOSIS — E785 Hyperlipidemia, unspecified: Secondary | ICD-10-CM | POA: Diagnosis present

## 2019-11-08 LAB — CBC WITH DIFFERENTIAL/PLATELET
Abs Immature Granulocytes: 0.01 10*3/uL (ref 0.00–0.07)
Basophils Absolute: 0 10*3/uL (ref 0.0–0.1)
Basophils Relative: 1 %
Eosinophils Absolute: 0.2 10*3/uL (ref 0.0–0.5)
Eosinophils Relative: 3 %
HCT: 29.7 % — ABNORMAL LOW (ref 39.0–52.0)
Hemoglobin: 9.9 g/dL — ABNORMAL LOW (ref 13.0–17.0)
Immature Granulocytes: 0 %
Lymphocytes Relative: 25 %
Lymphs Abs: 1.5 10*3/uL (ref 0.7–4.0)
MCH: 31.5 pg (ref 26.0–34.0)
MCHC: 33.3 g/dL (ref 30.0–36.0)
MCV: 94.6 fL (ref 80.0–100.0)
Monocytes Absolute: 0.6 10*3/uL (ref 0.1–1.0)
Monocytes Relative: 10 %
Neutro Abs: 3.6 10*3/uL (ref 1.7–7.7)
Neutrophils Relative %: 61 %
Platelets: 172 10*3/uL (ref 150–400)
RBC: 3.14 MIL/uL — ABNORMAL LOW (ref 4.22–5.81)
RDW: 14 % (ref 11.5–15.5)
WBC: 5.9 10*3/uL (ref 4.0–10.5)
nRBC: 0 % (ref 0.0–0.2)

## 2019-11-08 LAB — BASIC METABOLIC PANEL
Anion gap: 9 (ref 5–15)
BUN: 50 mg/dL — ABNORMAL HIGH (ref 8–23)
CO2: 20 mmol/L — ABNORMAL LOW (ref 22–32)
Calcium: 8.3 mg/dL — ABNORMAL LOW (ref 8.9–10.3)
Chloride: 111 mmol/L (ref 98–111)
Creatinine, Ser: 2.38 mg/dL — ABNORMAL HIGH (ref 0.61–1.24)
GFR calc Af Amer: 29 mL/min — ABNORMAL LOW (ref >60)
GFR calc non Af Amer: 25 mL/min — ABNORMAL LOW (ref >60)
Glucose, Bld: 127 mg/dL — ABNORMAL HIGH (ref 70–99)
Potassium: 4.4 mmol/L (ref 3.5–5.1)
Sodium: 140 mmol/L (ref 135–145)

## 2019-11-08 LAB — TYPE AND SCREEN
ABO/RH(D): O POS
Antibody Screen: NEGATIVE

## 2019-11-08 LAB — TROPONIN I (HIGH SENSITIVITY): Troponin I (High Sensitivity): 10 ng/L (ref <18)

## 2019-11-08 MED ORDER — SODIUM CHLORIDE 0.9 % IV BOLUS
1000.0000 mL | Freq: Once | INTRAVENOUS | Status: AC
  Administered 2019-11-08: 1000 mL via INTRAVENOUS

## 2019-11-08 NOTE — ED Triage Notes (Signed)
Pt comes from home with AMS. Family thought CBG was low but was 135. Pt altered and lethargic. Pinpoint pupils but no access to narcotics. EMS neg stroke screening. LKW 430pm. Answering most questions appropriately but is slow and hard to understand.

## 2019-11-08 NOTE — ED Provider Notes (Signed)
[facility] Emergency Department Provider Note  ____________________________________________   I have reviewed the triage vital signs and the nursing notes.   HISTORY  Chief Complaint Altered Mental Status   History limited by and level 5 caveat due to: Altered Mental Status   HPI Phillip Reyes is a 79 y.o. male who presents to the emergency department today because of concerns for altered mental status and weakness.  Is somewhat altered on initial exam.  He is unable to give a good history as to what is going on.  It is unclear how long patient has been having the symptoms.  He does deny any chest pain or abdominal pain.  Denies any fevers.   Records reviewed. Per medical record review patient has a history of COPD, DM. Had hospitalization last year for pneumonia.   Past Medical History:  Diagnosis Date  . Arthritis   . Cancer ([place])   . COPD (chronic obstructive pulmonary disease) ([place])   . Diabetes mellitus without complication ([place])   . Hypertension   . Renal disorder   . Stroke [facility])     Patient Active Problem List   Diagnosis Date Noted  . CAP (community acquired pneumonia) 01/27/2019  . Altered mental status 03/09/2016  . Acute encephalopathy 06/11/2015  . Type 2 diabetes mellitus ([place]) 06/11/2015  . HTN (hypertension) 06/11/2015  . COPD (chronic obstructive pulmonary disease) ([place]) 06/11/2015  . CKD (chronic kidney disease), stage III 06/11/2015  . H/O: stroke 06/11/2015    Past Surgical History:  Procedure Laterality Date  . APPENDECTOMY    . THROAT SURGERY      Prior to Admission medications   Medication Sig Start Date End Date Taking? Authorizing Provider  acetaminophen (TYLENOL) 325 MG tablet Take 650 mg by mouth every 6 (six) hours as needed.    [provider]  albuterol (PROVENTIL) (2.5 MG/3ML) 0.083% nebulizer solution Take 2.5 mg by nebulization every 6 (six) hours as needed for wheezing or shortness of breath.     [provider]  amLODipine (NORVASC) 10 MG tablet Take 10 mg by mouth daily.     [provider]  aspirin EC 81 MG tablet Take 81 mg by mouth daily.    [provider]  atorvastatin (LIPITOR) 80 MG tablet Take 80 mg by mouth at bedtime.    [provider]  budesonide-formoterol (SYMBICORT) 160-4.5 MCG/ACT inhaler Inhale 2 puffs into the lungs 2 (two) times daily.    [provider]  citalopram (CELEXA) 40 MG tablet Take 20 mg by mouth daily.     [provider]  doxazosin (CARDURA) 4 MG tablet Take 4 mg by mouth at bedtime.     [provider]  gabapentin (NEURONTIN) 400 MG capsule Take 400 mg by mouth daily.     [provider]  insulin glargine (LANTUS) 100 UNIT/ML injection Inject 55 Units into the skin at bedtime.     [provider]  insulin lispro (HUMALOG) 100 UNIT/ML injection Inject 12 Units into the skin 3 (three) times daily with meals. Use sliding scale to increase amount of insulin needed based on blood sugar.    [provider]  ipratropium-albuterol (DUONEB) 0.5-2.5 (3) MG/3ML SOLN Take 3 mLs by nebulization every 6 (six) hours as needed.    [provider]  lisinopril (PRINIVIL,ZESTRIL) 5 MG tablet Take 2.5 mg by mouth daily.    [provider]  omeprazole (PRILOSEC) 20 MG capsule Take 20 mg by mouth daily.  [provider]  vitamin B-12 (CYANOCOBALAMIN) 1000 MCG tablet Take 1,000 mcg by mouth daily.    [provider]  Vitamin D, Ergocalciferol, (DRISDOL) 1.25 MG (50000 UT) CAPS capsule Take 50,000 Units by mouth every 7 (seven) days.    [provider]    Allergies Norco [hydrocodone-acetaminophen]  Family History  Problem Relation Age of Onset  . Congestive Heart Failure Father   . Other Mother        unknown medical history    Social History Social History   Tobacco Use  . Smoking status: Current Every Day Smoker    Packs/day:  0.50    Years: 61.00    Pack years: 30.50    Types: Cigarettes  . Smokeless tobacco: Never Used  Vaping Use  . Vaping Use: Never used  Substance Use Topics  . Alcohol use: No  . Drug use: No    Review of Systems Unable to obtain reliable ROS secondary to ams ____________________________________________   PHYSICAL EXAM:  VITAL SIGNS: ED Triage Vitals  Enc Vitals Group     BP 11/08/19 1943 130/66     Pulse Rate 11/08/19 1943 (!) 51     Resp 11/08/19 1943 (!) 22     Temp 11/08/19 1944 99 F (37.2 C)     Temp Source 11/08/19 1944 Oral     SpO2 11/08/19 1943 95 %     Weight 11/08/19 1944 200 lb (90.7 kg)     Height 11/08/19 1944 5\' 10"  (1.778 m)     Head Circumference --      Peak Flow --      Pain Score 11/08/19 1943 5   Constitutional: Awake and alert. Not oriented. Eyes: Conjunctivae are normal.  ENT      Head: Normocephalic and atraumatic.      Nose: No congestion/rhinnorhea.      Mouth/Throat: Mucous membranes are moist.      Neck: No stridor. Hematological/Lymphatic/Immunilogical: No cervical lymphadenopathy. Cardiovascular: Normal rate, regular rhythm.  No murmurs, rubs, or gallops.  Respiratory: Normal respiratory effort without tachypnea nor retractions. Breath sounds are clear and equal bilaterally. No wheezes/rales/rhonchi. Gastrointestinal: Soft and non tender. No rebound. No guarding.  Genitourinary: Deferred Musculoskeletal: Normal range of motion in all extremities. No lower extremity edema. Neurologic:  Awake and alert. Not oriented. Skin:  Skin is warm, dry and intact. No rash noted. ____________________________________________    LABS (pertinent positives/negatives)  Trop hs 10 BMP na 140, k 4.4, glu 127, cr 2.38 CBC wbc 5.9, hgb 9.9, plt 172 ____________________________________________   EKG  I, Nance Pear, attending physician, personally viewed and interpreted this EKG  EKG Time: 1943 Rate: 52 Rhythm: sinus rhythm Axis: left axis  deviation Intervals: qtc 458 QRS: LVH with IVCD ST changes: no st elevation Impression: abnormal ekg  ____________________________________________    RADIOLOGY  CT head No acute abnormality  CXR Possible edema, atypical pneumonia. ____________________________________________   PROCEDURES  Procedures  ____________________________________________   INITIAL IMPRESSION / ASSESSMENT AND PLAN / ED COURSE  Pertinent labs & imaging results that were available during my care of the patient were reviewed by me and considered in my medical decision making (see chart for details).   Patient presented to the emergency department today because of concerns for altered mental status.  On my initial exam patient was quite altered.  He did also have some complaints of weakness.  Patient's work-up was concerning for possible atypical pneumonia.  He does have history of pneumonia.  Will start IV antibiotics.  During the patient stay here in the emergency department and after fluids he did seem to be more lucid. Will plan on admission. Discussed plan with patient.   ____________________________________________   FINAL CLINICAL IMPRESSION(S) / ED DIAGNOSES  Final diagnoses:  Pneumonia due to infectious organism, unspecified laterality, unspecified part of lung  Altered mental status, unspecified altered mental status type     Note: This dictation was prepared with Dragon dictation. Any transcriptional errors that result from this process are unintentional     Nance Pear, MD 11/09/19 [phone]

## 2019-11-09 DIAGNOSIS — Z79899 Other long term (current) drug therapy: Secondary | ICD-10-CM | POA: Diagnosis not present

## 2019-11-09 DIAGNOSIS — R001 Bradycardia, unspecified: Secondary | ICD-10-CM

## 2019-11-09 DIAGNOSIS — J189 Pneumonia, unspecified organism: Secondary | ICD-10-CM | POA: Diagnosis present

## 2019-11-09 DIAGNOSIS — F329 Major depressive disorder, single episode, unspecified: Secondary | ICD-10-CM | POA: Diagnosis present

## 2019-11-09 DIAGNOSIS — R9431 Abnormal electrocardiogram [ECG] [EKG]: Secondary | ICD-10-CM | POA: Diagnosis present

## 2019-11-09 DIAGNOSIS — E1122 Type 2 diabetes mellitus with diabetic chronic kidney disease: Secondary | ICD-10-CM | POA: Diagnosis present

## 2019-11-09 DIAGNOSIS — D649 Anemia, unspecified: Secondary | ICD-10-CM

## 2019-11-09 DIAGNOSIS — I129 Hypertensive chronic kidney disease with stage 1 through stage 4 chronic kidney disease, or unspecified chronic kidney disease: Secondary | ICD-10-CM | POA: Diagnosis present

## 2019-11-09 DIAGNOSIS — E1151 Type 2 diabetes mellitus with diabetic peripheral angiopathy without gangrene: Secondary | ICD-10-CM | POA: Diagnosis present

## 2019-11-09 DIAGNOSIS — Z794 Long term (current) use of insulin: Secondary | ICD-10-CM | POA: Diagnosis not present

## 2019-11-09 DIAGNOSIS — I10 Essential (primary) hypertension: Secondary | ICD-10-CM

## 2019-11-09 DIAGNOSIS — R300 Dysuria: Secondary | ICD-10-CM | POA: Diagnosis present

## 2019-11-09 DIAGNOSIS — E785 Hyperlipidemia, unspecified: Secondary | ICD-10-CM | POA: Diagnosis present

## 2019-11-09 DIAGNOSIS — J449 Chronic obstructive pulmonary disease, unspecified: Secondary | ICD-10-CM

## 2019-11-09 DIAGNOSIS — R531 Weakness: Secondary | ICD-10-CM

## 2019-11-09 DIAGNOSIS — J440 Chronic obstructive pulmonary disease with acute lower respiratory infection: Secondary | ICD-10-CM | POA: Diagnosis present

## 2019-11-09 DIAGNOSIS — F17210 Nicotine dependence, cigarettes, uncomplicated: Secondary | ICD-10-CM | POA: Diagnosis present

## 2019-11-09 DIAGNOSIS — Z8673 Personal history of transient ischemic attack (TIA), and cerebral infarction without residual deficits: Secondary | ICD-10-CM | POA: Diagnosis not present

## 2019-11-09 DIAGNOSIS — N1832 Chronic kidney disease, stage 3b: Secondary | ICD-10-CM | POA: Diagnosis present

## 2019-11-09 DIAGNOSIS — Z7982 Long term (current) use of aspirin: Secondary | ICD-10-CM | POA: Diagnosis not present

## 2019-11-09 DIAGNOSIS — Z20822 Contact with and (suspected) exposure to covid-19: Secondary | ICD-10-CM | POA: Diagnosis present

## 2019-11-09 DIAGNOSIS — Z885 Allergy status to narcotic agent status: Secondary | ICD-10-CM | POA: Diagnosis not present

## 2019-11-09 DIAGNOSIS — Z66 Do not resuscitate: Secondary | ICD-10-CM | POA: Diagnosis present

## 2019-11-09 DIAGNOSIS — R062 Wheezing: Secondary | ICD-10-CM | POA: Diagnosis present

## 2019-11-09 DIAGNOSIS — R410 Disorientation, unspecified: Secondary | ICD-10-CM

## 2019-11-09 DIAGNOSIS — G9341 Metabolic encephalopathy: Secondary | ICD-10-CM | POA: Diagnosis not present

## 2019-11-09 DIAGNOSIS — N179 Acute kidney failure, unspecified: Secondary | ICD-10-CM | POA: Diagnosis present

## 2019-11-09 DIAGNOSIS — Z7951 Long term (current) use of inhaled steroids: Secondary | ICD-10-CM | POA: Diagnosis not present

## 2019-11-09 DIAGNOSIS — Z8249 Family history of ischemic heart disease and other diseases of the circulatory system: Secondary | ICD-10-CM | POA: Diagnosis not present

## 2019-11-09 LAB — CBC
HCT: 31.4 % — ABNORMAL LOW (ref 39.0–52.0)
Hemoglobin: 10.2 g/dL — ABNORMAL LOW (ref 13.0–17.0)
MCH: 31.3 pg (ref 26.0–34.0)
MCHC: 32.5 g/dL (ref 30.0–36.0)
MCV: 96.3 fL (ref 80.0–100.0)
Platelets: 176 10*3/uL (ref 150–400)
RBC: 3.26 MIL/uL — ABNORMAL LOW (ref 4.22–5.81)
RDW: 14 % (ref 11.5–15.5)
WBC: 6.4 10*3/uL (ref 4.0–10.5)
nRBC: 0 % (ref 0.0–0.2)

## 2019-11-09 LAB — GLUCOSE, CAPILLARY
Glucose-Capillary: 117 mg/dL — ABNORMAL HIGH (ref 70–99)
Glucose-Capillary: 194 mg/dL — ABNORMAL HIGH (ref 70–99)
Glucose-Capillary: 129 mg/dL — ABNORMAL HIGH (ref 70–99)
Glucose-Capillary: 120 mg/dL — ABNORMAL HIGH (ref 70–99)

## 2019-11-09 LAB — BASIC METABOLIC PANEL
Anion gap: 9 (ref 5–15)
BUN: 44 mg/dL — ABNORMAL HIGH (ref 8–23)
CO2: 19 mmol/L — ABNORMAL LOW (ref 22–32)
Calcium: 7.8 mg/dL — ABNORMAL LOW (ref 8.9–10.3)
Chloride: 112 mmol/L — ABNORMAL HIGH (ref 98–111)
Creatinine, Ser: 2.01 mg/dL — ABNORMAL HIGH (ref 0.61–1.24)
GFR calc Af Amer: 36 mL/min — ABNORMAL LOW (ref >60)
GFR calc non Af Amer: 31 mL/min — ABNORMAL LOW (ref >60)
Glucose, Bld: 90 mg/dL (ref 70–99)
Potassium: 3.7 mmol/L (ref 3.5–5.1)
Sodium: 140 mmol/L (ref 135–145)

## 2019-11-09 LAB — URINALYSIS, COMPLETE (UACMP) WITH MICROSCOPIC
Bacteria, UA: NONE SEEN
Bilirubin Urine: NEGATIVE
Glucose, UA: NEGATIVE mg/dL
Hgb urine dipstick: NEGATIVE
Ketones, ur: NEGATIVE mg/dL
Leukocytes,Ua: NEGATIVE
Nitrite: NEGATIVE
Protein, ur: NEGATIVE mg/dL
Specific Gravity, Urine: 1.015 (ref 1.005–1.030)
Squamous Epithelial / HPF: NONE SEEN (ref 0–5)
pH: 5 (ref 5.0–8.0)

## 2019-11-09 LAB — RETICULOCYTES
Immature Retic Fract: 11.3 % (ref 2.3–15.9)
RBC.: 3.33 MIL/uL — ABNORMAL LOW (ref 4.22–5.81)
Retic Count, Absolute: 62.9 10*3/uL (ref 19.0–186.0)
Retic Ct Pct: 1.9 % (ref 0.4–3.1)

## 2019-11-09 LAB — IRON AND TIBC
Iron: 62 ug/dL (ref 45–182)
Saturation Ratios: 27 % (ref 17.9–39.5)
TIBC: 227 ug/dL — ABNORMAL LOW (ref 250–450)
UIBC: 165 ug/dL

## 2019-11-09 LAB — LACTATE DEHYDROGENASE: LDH: 113 U/L (ref 98–192)

## 2019-11-09 LAB — HIV ANTIBODY (ROUTINE TESTING W REFLEX): HIV Screen 4th Generation wRfx: NONREACTIVE

## 2019-11-09 LAB — EXPECTORATED SPUTUM ASSESSMENT W GRAM STAIN, RFLX TO RESP C

## 2019-11-09 LAB — HEMOGLOBIN A1C
Hgb A1c MFr Bld: 6.9 % — ABNORMAL HIGH (ref 4.8–5.6)
Mean Plasma Glucose: 151.33 mg/dL

## 2019-11-09 LAB — SARS CORONAVIRUS 2 BY RT PCR (HOSPITAL ORDER, PERFORMED IN ~~LOC~~ HOSPITAL LAB): SARS Coronavirus 2: NEGATIVE

## 2019-11-09 LAB — VITAMIN B12: Vitamin B-12: 287 pg/mL (ref 180–914)

## 2019-11-09 LAB — FERRITIN: Ferritin: 108 ng/mL (ref 24–336)

## 2019-11-09 LAB — FOLATE: Folate: 21.4 ng/mL (ref >5.9)

## 2019-11-09 LAB — TROPONIN I (HIGH SENSITIVITY): Troponin I (High Sensitivity): 10 ng/L (ref <18)

## 2019-11-09 LAB — STREP PNEUMONIAE URINARY ANTIGEN: Strep Pneumo Urinary Antigen: NEGATIVE

## 2019-11-09 MED ORDER — ONDANSETRON HCL 4 MG/2ML IJ SOLN: 4.0000 mg | Freq: Four times a day (QID) | INTRAMUSCULAR | Status: DC | PRN

## 2019-11-09 MED ORDER — SODIUM CHLORIDE 0.9 % IV SOLN
500.0000 mg | Freq: Once | INTRAVENOUS | Status: AC
  Administered 2019-11-09: 500 mg via INTRAVENOUS
  Filled 2019-11-09: qty 500

## 2019-11-09 MED ORDER — ATORVASTATIN CALCIUM 20 MG PO TABS
80.0000 mg | ORAL_TABLET | Freq: Every day | ORAL | Status: DC
  Administered 2019-11-09 – 2019-11-10 (×3): 80 mg via ORAL
  Filled 2019-11-09 (×3): qty 4

## 2019-11-09 MED ORDER — VITAMIN D (ERGOCALCIFEROL) 1.25 MG (50000 UNIT) PO CAPS
50000.0000 [IU] | ORAL_CAPSULE | ORAL | Status: DC
  Administered 2019-11-10: 15:00:00 50000 [IU] via ORAL
  Filled 2019-11-09: qty 1

## 2019-11-09 MED ORDER — GUAIFENESIN ER 600 MG PO TB12
600.0000 mg | ORAL_TABLET | Freq: Two times a day (BID) | ORAL | Status: DC
  Administered 2019-11-09 – 2019-11-10 (×5): 600 mg via ORAL
  Filled 2019-11-09 (×6): qty 1

## 2019-11-09 MED ORDER — DOXAZOSIN MESYLATE 4 MG PO TABS
4.0000 mg | ORAL_TABLET | Freq: Every day | ORAL | Status: DC
  Administered 2019-11-09 – 2019-11-10 (×2): 4 mg via ORAL
  Filled 2019-11-09 (×4): qty 1

## 2019-11-09 MED ORDER — ONDANSETRON HCL 4 MG PO TABS: 4.0000 mg | ORAL_TABLET | Freq: Four times a day (QID) | ORAL | Status: DC | PRN

## 2019-11-09 MED ORDER — VITAMIN B-12 1000 MCG PO TABS
1000.0000 ug | ORAL_TABLET | Freq: Every day | ORAL | Status: DC
  Administered 2019-11-09 – 2019-11-11 (×3): 1000 ug via ORAL
  Filled 2019-11-09 (×3): qty 1

## 2019-11-09 MED ORDER — PANTOPRAZOLE SODIUM 40 MG PO TBEC
40.0000 mg | DELAYED_RELEASE_TABLET | Freq: Every day | ORAL | Status: DC
  Administered 2019-11-09 – 2019-11-11 (×3): 40 mg via ORAL
  Filled 2019-11-09 (×3): qty 1

## 2019-11-09 MED ORDER — ACETAMINOPHEN 650 MG RE SUPP: 650.0000 mg | Freq: Four times a day (QID) | RECTAL | Status: DC | PRN

## 2019-11-09 MED ORDER — TRAZODONE HCL 50 MG PO TABS: 25.0000 mg | ORAL_TABLET | Freq: Every evening | ORAL | Status: DC | PRN

## 2019-11-09 MED ORDER — ACETAMINOPHEN 325 MG PO TABS: 650.0000 mg | ORAL_TABLET | Freq: Four times a day (QID) | ORAL | Status: DC | PRN

## 2019-11-09 MED ORDER — MAGNESIUM HYDROXIDE 400 MG/5ML PO SUSP: 30.0000 mL | Freq: Every day | ORAL | Status: DC | PRN

## 2019-11-09 MED ORDER — SODIUM CHLORIDE 0.9 % IV SOLN: INTRAVENOUS | Status: DC

## 2019-11-09 MED ORDER — INSULIN GLARGINE 100 UNIT/ML ~~LOC~~ SOLN: 55.0000 [IU] | Freq: Every day | SUBCUTANEOUS | Status: DC

## 2019-11-09 MED ORDER — IPRATROPIUM-ALBUTEROL 0.5-2.5 (3) MG/3ML IN SOLN: 3.0000 mL | Freq: Four times a day (QID) | RESPIRATORY_TRACT | Status: DC | PRN

## 2019-11-09 MED ORDER — INSULIN ASPART 100 UNIT/ML ~~LOC~~ SOLN
0.0000 [IU] | Freq: Three times a day (TID) | SUBCUTANEOUS | Status: DC
  Administered 2019-11-09: 13:00:00 2 [IU] via SUBCUTANEOUS
  Administered 2019-11-09: 17:00:00 1 [IU] via SUBCUTANEOUS
  Administered 2019-11-10: 22:00:00 2 [IU] via SUBCUTANEOUS
  Administered 2019-11-10: 18:00:00 1 [IU] via SUBCUTANEOUS
  Administered 2019-11-10: 13:00:00 2 [IU] via SUBCUTANEOUS
  Administered 2019-11-11: 3 [IU] via SUBCUTANEOUS
  Filled 2019-11-09 (×3): qty 1

## 2019-11-09 MED ORDER — AMLODIPINE BESYLATE 10 MG PO TABS
10.0000 mg | ORAL_TABLET | Freq: Every day | ORAL | Status: DC
  Administered 2019-11-09 – 2019-11-11 (×3): 10 mg via ORAL
  Filled 2019-11-09 (×3): qty 1

## 2019-11-09 MED ORDER — CITALOPRAM HYDROBROMIDE 20 MG PO TABS
20.0000 mg | ORAL_TABLET | Freq: Every day | ORAL | Status: DC
  Administered 2019-11-09 – 2019-11-11 (×3): 20 mg via ORAL
  Filled 2019-11-09 (×3): qty 1

## 2019-11-09 MED ORDER — ENOXAPARIN SODIUM 40 MG/0.4ML ~~LOC~~ SOLN
40.0000 mg | SUBCUTANEOUS | Status: DC
  Administered 2019-11-09 – 2019-11-11 (×3): 40 mg via SUBCUTANEOUS
  Filled 2019-11-09 (×3): qty 0.4

## 2019-11-09 MED ORDER — GABAPENTIN 100 MG PO CAPS
400.0000 mg | ORAL_CAPSULE | Freq: Every day | ORAL | Status: DC
  Administered 2019-11-09 – 2019-11-11 (×3): 400 mg via ORAL
  Filled 2019-11-09 (×4): qty 4

## 2019-11-09 MED ORDER — INSULIN GLARGINE 100 UNIT/ML ~~LOC~~ SOLN
28.0000 [IU] | Freq: Every day | SUBCUTANEOUS | Status: DC
  Administered 2019-11-09 – 2019-11-10 (×2): 28 [IU] via SUBCUTANEOUS
  Filled 2019-11-09 (×3): qty 0.28

## 2019-11-09 MED ORDER — ENOXAPARIN SODIUM 30 MG/0.3ML ~~LOC~~ SOLN
30.0000 mg | SUBCUTANEOUS | Status: DC
  Filled 2019-11-09: qty 0.3

## 2019-11-09 MED ORDER — SODIUM CHLORIDE 0.9 % IV SOLN
1.0000 g | Freq: Once | INTRAVENOUS | Status: AC
  Administered 2019-11-09: 1 g via INTRAVENOUS
  Filled 2019-11-09: qty 10

## 2019-11-09 MED ORDER — ASPIRIN EC 81 MG PO TBEC
81.0000 mg | DELAYED_RELEASE_TABLET | Freq: Every day | ORAL | Status: DC
  Administered 2019-11-09 – 2019-11-11 (×3): 81 mg via ORAL
  Filled 2019-11-09 (×4): qty 1

## 2019-11-09 MED ORDER — SODIUM CHLORIDE 0.9 % IV SOLN
2.0000 g | INTRAVENOUS | Status: DC
  Administered 2019-11-09 – 2019-11-10 (×2): 2 g via INTRAVENOUS
  Filled 2019-11-09 (×2): qty 2
  Filled 2019-11-09: qty 20

## 2019-11-09 MED ORDER — SODIUM CHLORIDE 0.9 % IV SOLN
500.0000 mg | INTRAVENOUS | Status: DC
  Administered 2019-11-09 – 2019-11-10 (×2): 500 mg via INTRAVENOUS
  Filled 2019-11-09 (×3): qty 500

## 2019-11-09 MED ORDER — LISINOPRIL 5 MG PO TABS: 2.5000 mg | ORAL_TABLET | Freq: Every day | ORAL | Status: DC

## 2019-11-09 MED ORDER — MOMETASONE FURO-FORMOTEROL FUM 200-5 MCG/ACT IN AERO
2.0000 | INHALATION_SPRAY | Freq: Two times a day (BID) | RESPIRATORY_TRACT | Status: DC
  Administered 2019-11-09 – 2019-11-11 (×5): 2 via RESPIRATORY_TRACT
  Filled 2019-11-09: qty 8.8

## 2019-11-09 NOTE — H&P (Signed)
[facility] [place] NAME: Phillip Reyes    MR#:  [id number]  DATE OF BIRTH:  [birth year]  DATE OF ADMISSION:  11/08/2019  PRIMARY CARE PHYSICIAN: [facility] [place]   REQUESTING/REFERRING PHYSICIAN: Nance Pear, MD  CHIEF COMPLAINT:   Chief Complaint  Patient presents with  . Altered Mental Status    HISTORY OF PRESENT ILLNESS:  Phillip Reyes  is a 79 y.o. male with a known history of COPD, type 2 diabetes mellitus, hypertension, peripheral vascular disease and CVA, who presented to the emergency room with acute onset of altered mental status with recent generalized weakness over the last week with associated cough productive of yellowish sputum as well as dyspnea and wheezing.  He denied any fever or chills however he stated that he stays cold.  He admitted to dysuria without urinary urgency or frequency or flank pain.  No nausea or vomiting or diarrhea. No abdominal pain or melena or bright red bleeding per rectum.  Upon presentation to the emergency room, heart rate was 51 and respiratory rate 22 and temperature 99.  Blood pressure 1 was then 195/70.  Labs revealed a BUN of 15 creatinine of 2.38 higher than previous levels of 22 and 1.9.  CBC showed anemia with hemoglobin of 9.9 hematocrit 29.7 compared to 12/36.4 on 01/27/2019.  High-sensitivity troponin was 10 and later the same.  UA was unremarkable.  Noncontrasted head CT scan showed no acute intracranial abnormalities.  It showed age-related atrophy and chronic microvascular ischemic changes.  Chest x-ray showed mild cardiomegaly and possible mild edema with atypical pneumonia not excluded.  It showed right lung base 12 mm nodular opacity with recommendation for follow-up imaging.  The patient was given IV Rocephin, Zithromax as well as 1 L bolus of IV normal saline.  He will be admitted to a medically monitored bed for further evaluation and management. PAST MEDICAL HISTORY:   Past Medical  History:  Diagnosis Date  . Arthritis   . Cancer ([place])   . COPD (chronic obstructive pulmonary disease) ([place])   . Diabetes mellitus without complication ([place])   . Hypertension   . Renal disorder   . Stroke [facility])     PAST SURGICAL HISTORY:   Past Surgical History:  Procedure Laterality Date  . APPENDECTOMY    . THROAT SURGERY      SOCIAL HISTORY:   Social History   Tobacco Use  . Smoking status: Current Every Day Smoker    Packs/day: 0.50    Years: 61.00    Pack years: 30.50    Types: Cigarettes  . Smokeless tobacco: Never Used  Substance Use Topics  . Alcohol use: No    FAMILY HISTORY:   Family History  Problem Relation Age of Onset  . Congestive Heart Failure Father   . Other Mother        unknown medical history    DRUG ALLERGIES:   Allergies  Allergen Reactions  . Norco [Hydrocodone-Acetaminophen] Shortness Of Breath    REVIEW OF SYSTEMS:   ROS As per history of present illness. All pertinent systems were reviewed above. Constitutional,  HEENT, cardiovascular, respiratory, GI, GU, musculoskeletal, neuro, psychiatric, endocrine,  integumentary and hematologic systems were reviewed and are otherwise  negative/unremarkable except for positive findings mentioned above in the HPI.   MEDICATIONS AT HOME:   Prior to Admission medications   Medication Sig Start Date End Date Taking? Authorizing Provider  acetaminophen (TYLENOL) 325 MG tablet Take  650 mg by mouth every 6 (six) hours as needed.    [provider]  albuterol (PROVENTIL) (2.5 MG/3ML) 0.083% nebulizer solution Take 2.5 mg by nebulization every 6 (six) hours as needed for wheezing or shortness of breath.    [provider]  amLODipine (NORVASC) 10 MG tablet Take 10 mg by mouth daily.     [provider]  aspirin EC 81 MG tablet Take 81 mg by mouth daily.    [provider]  atorvastatin (LIPITOR) 80 MG tablet Take 80 mg by mouth at bedtime.    [provider]  budesonide-formoterol (SYMBICORT) 160-4.5 MCG/ACT inhaler Inhale 2 puffs into the lungs 2 (two) times daily.    [provider]  citalopram (CELEXA) 40 MG tablet Take 20 mg by mouth daily.     [provider]  doxazosin (CARDURA) 4 MG tablet Take 4 mg by mouth at bedtime.     [provider]  gabapentin (NEURONTIN) 400 MG capsule Take 400 mg by mouth daily.     [provider]  insulin glargine (LANTUS) 100 UNIT/ML injection Inject 55 Units into the skin at bedtime.     [provider]  insulin lispro (HUMALOG) 100 UNIT/ML injection Inject 12 Units into the skin 3 (three) times daily with meals. Use sliding scale to increase amount of insulin needed based on blood sugar.    [provider]  ipratropium-albuterol (DUONEB) 0.5-2.5 (3) MG/3ML SOLN Take 3 mLs by nebulization every 6 (six) hours as needed.    [provider]  lisinopril (PRINIVIL,ZESTRIL) 5 MG tablet Take 2.5 mg by mouth daily.    [provider]  omeprazole (PRILOSEC) 20 MG capsule Take 20 mg by mouth daily.    [provider]  vitamin B-12 (CYANOCOBALAMIN) 1000 MCG tablet Take 1,000 mcg by mouth daily.    [provider]  Vitamin D, Ergocalciferol, (DRISDOL) 1.25 MG (50000 UT) CAPS capsule Take 50,000 Units by mouth every 7 (seven) days.    [provider]      VITAL SIGNS:  Blood pressure (!) 183/70, pulse (!) 46, temperature 99 F (37.2 C), temperature source Oral, resp. rate 17, height 5\' 10"  (1.778 m), weight 90.7 kg, SpO2 100 %.  PHYSICAL EXAMINATION:  Physical Exam  GENERAL:  79 y.o.-year-old Caucasian male patient lying in the bed with no acute distress.  EYES: Pupils equal, round, reactive to light and accommodation. No scleral icterus. Extraocular muscles intact.  HEENT: Head atraumatic, normocephalic. Oropharynx and nasopharynx clear.  NECK:  Supple, no jugular venous distention. No thyroid enlargement,  no tenderness.  LUNGS: Diminished bibasal breath sounds with bibasal crackles. CARDIOVASCULAR: Regular rate and rhythm, S1, S2 normal. No murmurs, rubs, or gallops.  ABDOMEN: Soft, nondistended, nontender. Bowel sounds present. No organomegaly or mass.  EXTREMITIES: No pedal edema, cyanosis, or clubbing.  NEUROLOGIC: Cranial nerves II through XII are intact. Muscle strength 5/5 in all extremities. Sensation intact. Gait not checked.  PSYCHIATRIC: The patient is alert and oriented x 3.  Normal affect and good eye contact. SKIN: No obvious rash, lesion, or ulcer.   LABORATORY PANEL:   CBC Recent Labs  Lab 11/08/19 1949  WBC 5.9  HGB 9.9*  HCT 29.7*  PLT 172   ------------------------------------------------------------------------------------------------------------------  Chemistries  Recent Labs  Lab 11/08/19 1949  NA 140  K 4.4  CL 111  CO2 20*  GLUCOSE 127*  BUN 50*  CREATININE 2.38*  CALCIUM 8.3*   ------------------------------------------------------------------------------------------------------------------  Cardiac Enzymes  No results for input(s): TROPONINI in the last 168 hours. ------------------------------------------------------------------------------------------------------------------  RADIOLOGY:  CT Head Wo Contrast  Result Date: 11/08/2019 CLINICAL DATA:  79 year old male with altered mental status. EXAM: CT HEAD WITHOUT CONTRAST TECHNIQUE: Contiguous axial images were obtained from the base of the skull through the vertex without intravenous contrast. COMPARISON:  Head CT dated 03/09/2016. FINDINGS: Brain: There is mild-to-moderate age-related atrophy and chronic microvascular ischemic changes. There is no acute intracranial hemorrhage. No mass effect or midline shift. No extra-axial fluid collection. Vascular: No hyperdense vessel or unexpected calcification. Skull: Normal. Negative for fracture or focal lesion. Sinuses/Orbits: No acute finding. Other:  None IMPRESSION: 1. No acute intracranial pathology. 2. Age-related atrophy and chronic microvascular ischemic changes. Electronically Signed   By: Anner Crete M.D.   On: 11/08/2019 20:57   DG Chest Portable 1 View  Result Date: 11/08/2019 CLINICAL DATA:  79 year old male with altered mental status. EXAM: PORTABLE CHEST 1 VIEW COMPARISON:  Chest radiograph dated 01/27/2019. FINDINGS: There is mild cardiomegaly. Diffuse interstitial and vascular prominence may represent edema. Atypical pneumonia is not excluded clinical correlation is recommended. No focal consolidation, pleural effusion, or pneumothorax. A 12 mm apparent nodular density at the right lung base, seen only on 1 of the views is indeterminate and may represent nipple shadow or a focal interstitial confluence. Repeat radiograph with placement of nipple markers may provide better evaluation. Atherosclerotic calcification of the aorta. No acute osseous pathology. IMPRESSION: 1. Mild cardiomegaly with possible mild edema. Atypical pneumonia is not excluded. 2. A 12 mm apparent nodular density at the right lung base. Attention on follow-up imaging recommended. Electronically Signed   By: Anner Crete M.D.   On: 11/08/2019 23:59      IMPRESSION AND PLAN:  1.  Community-acquired pneumonia. -The patient will be continued on IV Rocephin and Zithromax. -Mucolytic therapy will be provided. -We will obtain blood cultures with sputum Gram stain culture and sensitivity. -We will obtain BNP level as well as lactic acid and procalcitonin  2.  Acute encephalopathy with generalized weakness. -This is likely multifactorial related to pneumonia and partly due to acute kidney injury and anemia and possibly symptomatic bradycardia. -Head CT scan revealed no acute abnormalities and urinalysis came back negative. -We will follow neuro checks every 4 hours for 24 hours. -Management otherwise as above.  3.  Acute kidney injury superimposed on stage  IIIb chronic kidney disease. -The patient will be hydrated with IV normal saline and will follow his BMP. -Nephrotoxins will be avoided.  4.  Worsening anemia, acute on chronic. -We will obtain anemia work-up and follow CBC.  5.  Hypertension. -We will continue Norvasc and Cardura.  6.  Dyslipidemia. -We will continue Lipitor.  7.  COPD. -We will continue his Symbicort.  8.  Type 2 diabetes mellitus. -We will place him on supplement coverage with NovoLog and continue his basal coverage.  9.  Depression. -We will continue his Celexa.  10.  DVT prophylaxis. -Subcutaneous Lovenox.     All the records are reviewed and case discussed with ED provider. The plan of care was discussed in details with the patient (and family). I answered all questions. The patient agreed to proceed with the above mentioned plan. Further management will depend upon hospital course.   CODE STATUS: The patient is DNR/DNI.  Status is: Inpatient The patient will require at least a couple of days with IV antibiotics and hydration with IV fluids and follow-up on blood cultures.  Dispo: The patient is  from: Home              Anticipated d/c is to: Home              Anticipated d/c date is: 2 days              Patient currently is not medically stable to d/c.   TOTAL TIME TAKING CARE OF THIS PATIENT: 36minutes.    Christel Mormon M.D on 11/09/2019 at 12:53 AM  [facility]   From 7 PM-7 AM, contact night-coverage [email or web address]  CC: Primary care physician; [place]   Note: This dictation was prepared with Dragon dictation along with smaller phrase technology. Any transcriptional typo errors that result from this process are unintentional.

## 2019-11-09 NOTE — Evaluation (Signed)
Occupational Therapy Evaluation Patient Details Name: Phillip Reyes MRN: [id number] DOB: 24-Dec-1940 Today's Date: 11/09/2019    History of Present Illness Pt. is a 79 y.o. male who was admitted to [facility] with Community Acquired Pneumonia, acute encephalopathy. PMHx includes: Acute kidney disease, Anemia, HTN, CVA, PVD, and Dyslipidemia   Clinical Impression   Pt. presents with weakness, limited activity tolerance,  and limited functional mobility which limits his ability to complete basic ADL and IADL functioning. Pt. Resides at home with his wife, and is the primary caregiver for her. Pt. has a home health aide who assists both he, and his wife. The aide assists pt. with LE dressing secondary to difficulty reaching to his LEs, and meal preparation. Pt. drove minimally, and the aide assists with transportation. Pt. Has a ramp. Pt. has residual right sided weakness following a CVA in November. Pt. could benefit from OT services for ADL training, A/E training, neuromuscular re-ed, there. Ex. and pt. Education about home modification, and DME. Pt. Plans to return home upon discharge with family to assist pt. as needed. Pt. Could benefit from follow-up [place] services upon discharge.    Follow Up Recommendations  Home health OT    Equipment Recommendations       Recommendations for Other Services  None recommended by OT     Precautions / Restrictions Precautions Precautions: Fall Restrictions Weight Bearing Restrictions: No      Mobility Bed Mobility Overal bed mobility: Needs Assistance Bed Mobility: Supine to Sit     Supine to sit: Independent (required increased time)        Transfers Overall transfer level: Needs assistance   Transfers: Sit to/from Stand Sit to Stand: Min guard              Balance     Sitting balance-Leahy Scale: Good       Standing balance-Leahy Scale: Fair                             ADL either performed or assessed with clinical  judgement   ADL Overall ADL's : Needs assistance/impaired Eating/Feeding: Set up;Independent   Grooming: Set up;Supervision/safety   Upper Body Bathing: Set up;Minimal assistance   Lower Body Bathing: Set up;Maximal assistance   Upper Body Dressing : Set up;Minimal assistance   Lower Body Dressing: Set up;Maximal assistance               Functional mobility during ADLs: Min guard       Vision Baseline Vision/History: Wears glasses Patient Visual Report: No change from baseline       Perception     Praxis      Pertinent Vitals/Pain Pain Assessment: No/denies pain     Hand Dominance Right   Extremity/Trunk Assessment Upper Extremity Assessment Upper Extremity Assessment: Generalized weakness (RUE limited with decreased grip strength from previous CVA.)           Communication Communication Communication: No difficulties   Cognition Arousal/Alertness: Awake/alert Behavior During Therapy: WFL for tasks assessed/performed Overall Cognitive Status: Within Functional Limits for tasks assessed                                     General Comments       Exercises     Shoulder Instructions      Home Living Family/patient expects to be discharged to:: Private residence  Living Arrangements: Spouse/significant other;Children Available Help at Discharge: Personal care attendant Type of Home: House Home Access: Ramped entrance     Home Layout: One level     Bathroom Shower/Tub: Walk-in Hydrologist: Standard     Home Equipment: Hand held shower head;Shower seat          Prior Functioning/Environment Level of Independence: Needs assistance    ADL's / Homemaking Assistance Needed: Pt. has a personal care aid for his wife , however the aide also assists the pt. with LE dressing skills, and meal preparation. Pt. drives minimally.   Comments: Pt. uses a SPC        OT Problem List: Decreased strength;Decreased  coordination;Decreased knowledge of use of DME or AE      OT Treatment/Interventions: Self-care/ADL training;Patient/family education;Therapeutic exercise;DME and/or AE instruction;Therapeutic activities;Energy conservation;Neuromuscular education    OT Goals(Current goals can be found in the care plan section) Acute Rehab OT Goals Patient Stated Goal: To return home OT Goal Formulation: With patient Potential to Achieve Goals: Good  OT Frequency: Min 2X/week   Barriers to D/C:            Co-evaluation              AM-PAC OT "6 Clicks" Daily Activity     Outcome Measure Help from another person eating meals?: None Help from another person taking care of personal grooming?: A Little Help from another person toileting, which includes using toliet, bedpan, or urinal?: A Little Help from another person bathing (including washing, rinsing, drying)?: A Lot Help from another person to put on and taking off regular upper body clothing?: A Little Help from another person to put on and taking off regular lower body clothing?: A Lot 6 Click Score: 17   End of Session    Activity Tolerance: Patient tolerated treatment well Patient left: in bed;with call bell/phone within reach;with bed alarm set  OT Visit Diagnosis: Muscle weakness (generalized) (M62.81)                Time: 9702-6378 OT Time Calculation (min): 25 min Charges:  OT General Charges $OT Visit: 1 Visit OT Evaluation $OT Eval Moderate Complexity: 1 Mod  Harrel Carina, MS, OTR/L  Harrel Carina 11/09/2019, 12:18 PM

## 2019-11-09 NOTE — Progress Notes (Signed)
The patient is a 79 yr old man who presented to [facility] ED on 11/08/2019 with complaints of progressive weakness and acute on set of altered mental status. The patient had complained of cough productive or yellow sputum. He denied fevers, chills, nausea, or vomiting.   The patient carries a past medical history significant for COPD, DM II, hypertension, peripheral vascular disease, and CVA.   In the ED the patient had a normal temperature, respiratory rate, and a slightly hypertensive blood pressure. He was saturating 95-100% on room air. Heart rates have been almost entirely in the low 50's with a couple of drops into the 40's and one high of 62 since he has been here.  The patient was admitted to a telemetry bed early this morning by my colleague, Dr. Sidney Ace. He is being treated for CAP with Rocephin and Azithromycin.   This morning the patient is oriented. He states that he is feeling better. He continues to cough up sputum. Heart and lung sounds are within normal limits. Abdomen is soft, non-tender, and non-distended.  Plan to ambulate patient and monitor heart rate. I am somewhat concerned that the patient's weakness and confusion may be due to his bradycardia, but it is unclear if the patient could be symptomatic from this. Monitor.

## 2019-11-09 NOTE — ED Notes (Signed)
Pt transported to floor by Jenny Reichmann NT

## 2019-11-09 NOTE — Progress Notes (Signed)
PT Cancellation Note  Patient Details Name: Baker Moronta MRN: [id number] DOB: October 16, 1940   Cancelled Treatment:    Reason Eval/Treat Not Completed:  (Consult received and chart reviewed. Patient sleeping soundly upon arrival room.  Will continue efforts at later time/date as medically appropriate and available.)   Korra Christine H. Owens Shark, PT, DPT, NCS 11/09/19, 9:41 AM [phone]

## 2019-11-09 NOTE — Progress Notes (Signed)
PT Cancellation Note  Patient Details Name: Rafay Dahan MRN: [id number] DOB: January 14, 1941   Cancelled Treatment:    Reason Eval/Treat Not Completed: Fatigue/lethargy limiting ability to participate (Evaluation re-attempted. Patient remains sleeping soundly.  Will re-attempt next date as medically appropriate.)   Briyan Kleven H. Owens Shark, PT, DPT, NCS 11/09/19, 3:28 PM [phone]

## 2019-11-09 NOTE — Plan of Care (Signed)

## 2019-11-10 DIAGNOSIS — R262 Difficulty in walking, not elsewhere classified: Secondary | ICD-10-CM

## 2019-11-10 DIAGNOSIS — N189 Chronic kidney disease, unspecified: Secondary | ICD-10-CM

## 2019-11-10 DIAGNOSIS — N179 Acute kidney failure, unspecified: Secondary | ICD-10-CM

## 2019-11-10 LAB — BASIC METABOLIC PANEL
Anion gap: 6 (ref 5–15)
BUN: 34 mg/dL — ABNORMAL HIGH (ref 8–23)
CO2: 23 mmol/L (ref 22–32)
Calcium: 8.2 mg/dL — ABNORMAL LOW (ref 8.9–10.3)
Chloride: 113 mmol/L — ABNORMAL HIGH (ref 98–111)
Creatinine, Ser: 2 mg/dL — ABNORMAL HIGH (ref 0.61–1.24)
GFR calc Af Amer: 36 mL/min — ABNORMAL LOW (ref >60)
GFR calc non Af Amer: 31 mL/min — ABNORMAL LOW (ref >60)
Glucose, Bld: 89 mg/dL (ref 70–99)
Potassium: 4 mmol/L (ref 3.5–5.1)
Sodium: 142 mmol/L (ref 135–145)

## 2019-11-10 LAB — GLUCOSE, CAPILLARY
Glucose-Capillary: 93 mg/dL (ref 70–99)
Glucose-Capillary: 194 mg/dL — ABNORMAL HIGH (ref 70–99)
Glucose-Capillary: 138 mg/dL — ABNORMAL HIGH (ref 70–99)
Glucose-Capillary: 168 mg/dL — ABNORMAL HIGH (ref 70–99)

## 2019-11-10 NOTE — Evaluation (Signed)
Physical Therapy Evaluation Patient Details Name: Phillip Reyes MRN: [id number] DOB: 07/16/1940 Today's Date: 11/10/2019   History of Present Illness  Pt. is a 79 y.o. male who was admitted to [facility] with Community Acquired Pneumonia, acute encephalopathy. PMHx includes: Acute kidney disease, Anemia, HTN, CVA, PVD, and Dyslipidemia    Clinical Impression  Patient easily woken, oriented x4, denied pain. Pt reported at baseline he lives with his wife as her primary caregiver, but they have an aide who assists with cooking/cleaning and pt's sock donning. Aide runs errands/drives. Pt denied falls in the last 6 months, uses a SPC at baseline for ambulation.  The patient demonstrated bed mobility modI, and sit <> stand with RW and supervision, cueing for hand placement. The patient was able to ambulate ~138ft with RW and supervision. Pt trialed with hallway rail, exhibited decreased gait velocity and confidence, PT provided education of potential benefit of RW use at discharge and pt verbalized understanding.  Overall the patient demonstrated mild deficits (see "PT Problem List") from PLOF and would benefit from skilled PT intervention. Recommendation is HHPT.     Follow Up Recommendations Home health PT    Equipment Recommendations  None recommended by PT    Recommendations for Other Services       Precautions / Restrictions Precautions Precautions: Fall Restrictions Weight Bearing Restrictions: No      Mobility  Bed Mobility Overal bed mobility: Modified Independent                Transfers Overall transfer level: Needs assistance Equipment used: Rolling walker (2 wheeled) Transfers: Sit to/from Stand Sit to Stand: Supervision         General transfer comment: cueing for hand placement  Ambulation/Gait Ambulation/Gait assistance: Supervision Gait Distance (Feet): 160 Feet Assistive device: Rolling walker (2 wheeled) (hallway hand rail for single UE support)   Gait  velocity: decreased      Stairs            Wheelchair Mobility    Modified Rankin (Stroke Patients Only)       Balance Overall balance assessment: Needs assistance Sitting-balance support: Feet supported Sitting balance-Leahy Scale: Good       Standing balance-Leahy Scale: Good Standing balance comment: pt able to ambulate without RW, and with unilateral UE, could potentially ambulate without device, but safety improved with support with functional mobility                             Pertinent Vitals/Pain Pain Assessment: No/denies pain    Home Living Family/patient expects to be discharged to:: Private residence Living Arrangements: Spouse/significant other;Children Available Help at Discharge: Personal care attendant (9-5 monday-friday, daughter assists at night  for his wife) Type of Home: House Home Access: Ramped entrance     Home Layout: One level Home Equipment: Hand held shower head;Shower seat;Bedside commode;Grab bars - toilet;Grab bars - tub/shower;Cane - single point;Hospital bed Additional Comments: pt denies falls in the last 6 months    Prior Function Level of Independence: Needs assistance   Gait / Transfers Assistance Needed: Pt uses SPC at baseline for ambulation  ADL's / Homemaking Assistance Needed: Pt. has a personal care aid for his wife , however the aide also assists the pt. with LE dressing skills, and meal preparation. Pt. drives minimally.  Comments: Pt. uses a SPC     Hand Dominance   Dominant Hand: Right    Extremity/Trunk Assessment   Upper  Extremity Assessment Upper Extremity Assessment: Defer to OT evaluation    Lower Extremity Assessment Lower Extremity Assessment: Generalized weakness    Cervical / Trunk Assessment Cervical / Trunk Assessment: Kyphotic  Communication   Communication: No difficulties;HOH  Cognition Arousal/Alertness: Awake/alert Behavior During Therapy: WFL for tasks  assessed/performed Overall Cognitive Status: Within Functional Limits for tasks assessed                                        General Comments      Exercises     Assessment/Plan    PT Assessment Patient needs continued PT services  PT Problem List Decreased mobility;Decreased activity tolerance;Decreased balance       PT Treatment Interventions DME instruction;Therapeutic exercise;Gait training;Balance training;Stair training;Neuromuscular re-education;Functional mobility training;Therapeutic activities;Patient/family education    PT Goals (Current goals can be found in the Care Plan section)  Acute Rehab PT Goals Patient Stated Goal: to go home PT Goal Formulation: With patient Time For Goal Achievement: 11/24/19 Potential to Achieve Goals: Good    Frequency Min 2X/week   Barriers to discharge        Co-evaluation               AM-PAC PT "6 Clicks" Mobility  Outcome Measure Help needed turning from your back to your side while in a flat bed without using bedrails?: None Help needed moving from lying on your back to sitting on the side of a flat bed without using bedrails?: A Little Help needed moving to and from a bed to a chair (including a wheelchair)?: A Little Help needed standing up from a chair using your arms (e.g., wheelchair or bedside chair)?: None Help needed to walk in hospital room?: A Little Help needed climbing 3-5 steps with a railing? : A Little 6 Click Score: 20    End of Session Equipment Utilized During Treatment: Gait belt Activity Tolerance: Patient tolerated treatment well Patient left: in chair;with call bell/phone within reach Nurse Communication: Mobility status PT Visit Diagnosis: Other abnormalities of gait and mobility (R26.89);Muscle weakness (generalized) (M62.81);Difficulty in walking, not elsewhere classified (R26.2)    Time: 5885-0277 PT Time Calculation (min) (ACUTE ONLY): 23 min   Charges:   PT  Evaluation $PT Eval Low Complexity: 1 Low PT Treatments $Therapeutic Exercise: 8-22 mins        Lieutenant Diego PT, DPT 11:33 AM,11/10/19

## 2019-11-10 NOTE — Progress Notes (Signed)
PROGRESS NOTE  Phillip Reyes AOZ:[id number] DOB: 1940-09-21 DOA: 11/08/2019 PCP: [facility] [place]  Brief History   The patient is a 79 yr old man who presented to [facility] ED on 11/08/2019 with complaints of progressive weakness and acute on set of altered mental status. The patient had complained of cough productive or yellow sputum. He denied fevers, chills, nausea, or vomiting.   The patient carries a past medical history significant for COPD, DM II, hypertension, peripheral vascular disease, and CVA.   In the ED the patient had a normal temperature, respiratory rate, and a slightly hypertensive blood pressure. He was saturating 95-100% on room air. Heart rates have been almost entirely in the low 50's with a couple of drops into the 40's and one high of 62 since he has been here.  The patient was admitted to a telemetry bed early this morning by my colleague, Dr. Sidney Ace. He is being treated for CAP with Rocephin and Azithromycin.   This morning the patient is oriented. He states that he is feeling better. He continues to cough up sputum. Heart and lung sounds are within normal limits. Abdomen is soft, non-tender, and non-distended.  The patient has been evaluated by PT/OT. They have recommended that the patient be discharged to home with home health PT/OT. I have asked nursing to check heart rate with ambulation. If it does not rise appropriately with exertion I will consult cardiology for symptomatic bradycardia.Monitor.  Consultants  . None  Procedures  . None  Antibiotics   Anti-infectives (From admission, onward)   Start     Dose/Rate Route Frequency Ordered Stop   11/09/19 2300  cefTRIAXone (ROCEPHIN) 2 g in sodium chloride 0.9 % 100 mL IVPB     Discontinue     2 g 200 mL/hr over 30 Minutes Intravenous Every 24 hours 11/09/19 0052 11/14/19 2259   11/09/19 2300  azithromycin (ZITHROMAX) 500 mg in sodium chloride 0.9 % 250 mL IVPB     Discontinue     500 mg 250 mL/hr  over 60 Minutes Intravenous Every 24 hours 11/09/19 0052 11/14/19 2259   11/09/19 0045  azithromycin (ZITHROMAX) 500 mg in sodium chloride 0.9 % 250 mL IVPB        500 mg 250 mL/hr over 60 Minutes Intravenous  Once 11/09/19 0030 11/09/19 1043   11/09/19 0045  cefTRIAXone (ROCEPHIN) 1 g in sodium chloride 0.9 % 100 mL IVPB        1 g 200 mL/hr over 30 Minutes Intravenous  Once 11/09/19 0030 11/09/19 0118    .  Subjective  The patient is resting quietly. No new complaints.  Objective   Vitals:  Vitals:   11/10/19 0401 11/10/19 0747  BP: (!) 143/54 (!) 143/55  Pulse: (!) 55 (!) 50  Resp: 20 16  Temp: 98.1 F (36.7 C) 98.2 F (36.8 C)  SpO2: 98% 99%   Exam:  Constitutional:  . The patient is awake, alert, and oriented x 3. No acute distress. Respiratory:  . No increased work of breathing. . No wheezes, rales, or rhonchi . No tactile fremitus Cardiovascular:  . Regular rate and rhythm . No murmurs, ectopy, or gallups. . No lateral PMI. No thrills. Abdomen:  . Abdomen is soft, non-tender, non-distended . No hernias, masses, or organomegaly . Normoactive bowel sounds.  Musculoskeletal:  . No cyanosis, clubbing, or edema Skin:  . No rashes, lesions, ulcers . palpation of skin: no induration or nodules Neurologic:  . CN 2-12 intact .  Sensation all 4 extremities intact Psychiatric:  . Mental status o Mood, affect appropriate o Orientation to person, place, time  . judgment and insight appear intact  I have personally reviewed the following:   Today's Data  . Vitals, BMP, CBC  Micro Data  . Sputum cultures: pending  Imaging  . CT chest  Cardiology Data  . EKG  Scheduled Meds: . amLODipine  10 mg Oral Daily  . aspirin EC  81 mg Oral Daily  . atorvastatin  80 mg Oral QHS  . citalopram  20 mg Oral Daily  . doxazosin  4 mg Oral QHS  . enoxaparin (LOVENOX) injection  40 mg Subcutaneous Q24H  . gabapentin  400 mg Oral Daily  . guaiFENesin  600 mg Oral BID    . insulin aspart  0-9 Units Subcutaneous TID PC & HS  . insulin glargine  28 Units Subcutaneous QHS  . mometasone-formoterol  2 puff Inhalation BID  . pantoprazole  40 mg Oral Daily  . vitamin B-12  1,000 mcg Oral Daily  . Vitamin D (Ergocalciferol)  50,000 Units Oral Q7 days   Continuous Infusions: . sodium chloride Stopped (11/09/19 1250)  . azithromycin Stopped (11/10/19 0023)  . cefTRIAXone (ROCEPHIN)  IV Stopped (11/09/19 2251)    Active Problems:   CAP (community acquired pneumonia)   LOS: 1 day   A & P  Community acquired pneumonia: The patient is receiving IV Rocephin and Azithromycin. Sputum cultures are pending.   Bradycardia: Role of heart rate on patient's progressive weakness is unclear. Will have nursing check heart rate with ambulation.   Generalized weakness: The patient has been evaluated by PT/OT. They have recommended discharge to home with home health PT/OT.  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 This is her baseline. Continue to monitor creatinine, electrolytes, and volume status.  DM II: Glucoses are managed with Lantus, FSBS and SSI. Glucoses are running from 95 - 194 in the last 24 hours.  I have seen and examined this patient myself. I have spent 34 minutes in his evaluation and care.  DVT Prophylaxis: Lovenox CODE STATUS: DNR Family Communication: None available Disposition: Patient is from home. Anticipate discharge to home with home health. Barrier to discharge question of possible symptomatic bradycardia.  Kebra Lowrimore, DO [facility] Direct contact: see [email or web address]  7PM-7AM contact night coverage as above 11/10/2019, 3:17 PM  LOS: 1 day

## 2019-11-11 DIAGNOSIS — E118 Type 2 diabetes mellitus with unspecified complications: Secondary | ICD-10-CM

## 2019-11-11 DIAGNOSIS — N1831 Chronic kidney disease, stage 3a: Secondary | ICD-10-CM

## 2019-11-11 LAB — CBC
HCT: 28.6 % — ABNORMAL LOW (ref 39.0–52.0)
Hemoglobin: 10 g/dL — ABNORMAL LOW (ref 13.0–17.0)
MCH: 31.5 pg (ref 26.0–34.0)
MCHC: 35 g/dL (ref 30.0–36.0)
MCV: 90.2 fL (ref 80.0–100.0)
Platelets: 163 10*3/uL (ref 150–400)
RBC: 3.17 MIL/uL — ABNORMAL LOW (ref 4.22–5.81)
RDW: 13.7 % (ref 11.5–15.5)
WBC: 6.1 10*3/uL (ref 4.0–10.5)
nRBC: 0 % (ref 0.0–0.2)

## 2019-11-11 LAB — GLUCOSE, CAPILLARY
Glucose-Capillary: 76 mg/dL (ref 70–99)
Glucose-Capillary: 220 mg/dL — ABNORMAL HIGH (ref 70–99)

## 2019-11-11 LAB — BASIC METABOLIC PANEL
Anion gap: 7 (ref 5–15)
BUN: 28 mg/dL — ABNORMAL HIGH (ref 8–23)
CO2: 23 mmol/L (ref 22–32)
Calcium: 8.3 mg/dL — ABNORMAL LOW (ref 8.9–10.3)
Chloride: 112 mmol/L — ABNORMAL HIGH (ref 98–111)
Creatinine, Ser: 2 mg/dL — ABNORMAL HIGH (ref 0.61–1.24)
GFR calc Af Amer: 36 mL/min — ABNORMAL LOW (ref >60)
GFR calc non Af Amer: 31 mL/min — ABNORMAL LOW (ref >60)
Glucose, Bld: 67 mg/dL — ABNORMAL LOW (ref 70–99)
Potassium: 4 mmol/L (ref 3.5–5.1)
Sodium: 142 mmol/L (ref 135–145)

## 2019-11-11 LAB — CULTURE, RESPIRATORY W GRAM STAIN: Culture: NORMAL

## 2019-11-11 MED ORDER — AZITHROMYCIN 500 MG PO TABS: 500.0000 mg | ORAL_TABLET | Freq: Every day | ORAL | 0 refills | Status: AC

## 2019-11-11 MED ORDER — INSULIN GLARGINE 100 UNIT/ML ~~LOC~~ SOLN
22.0000 [IU] | Freq: Every day | SUBCUTANEOUS | Status: DC
  Filled 2019-11-11: qty 0.22

## 2019-11-11 MED ORDER — AMLODIPINE BESYLATE 10 MG PO TABS: 10.0000 mg | ORAL_TABLET | Freq: Every day | ORAL | 0 refills | Status: DC

## 2019-11-11 MED ORDER — AMOXICILLIN-POT CLAVULANATE 875-125 MG PO TABS: 1.0000 | ORAL_TABLET | Freq: Two times a day (BID) | ORAL | 0 refills | Status: AC

## 2019-11-11 MED ORDER — GUAIFENESIN ER 600 MG PO TB12: 600.0000 mg | ORAL_TABLET | Freq: Two times a day (BID) | ORAL | 0 refills | Status: DC

## 2019-11-11 MED ORDER — VITAMIN D (ERGOCALCIFEROL) 1.25 MG (50000 UNIT) PO CAPS: 50000.0000 [IU] | ORAL_CAPSULE | ORAL | Status: DC

## 2019-11-11 MED ORDER — DOXAZOSIN MESYLATE 4 MG PO TABS: 4.0000 mg | ORAL_TABLET | Freq: Every day | ORAL | 0 refills | Status: DC

## 2019-11-11 NOTE — Progress Notes (Signed)
Occupational Therapy Treatment Patient Details Name: Phillip Reyes MRN: [id number] DOB: [birth year] Today's Date: 11/11/2019    History of present illness Pt. is a 79 y.o. male who was admitted to [facility] with Community Acquired Pneumonia, acute encephalopathy. PMHx includes: Acute kidney disease, Anemia, HTN, CVA, PVD, and Dyslipidemia   OT comments  Pt seen for OT tx this date. Pt HR at rest in bed 60bpm. Increased time/effort to perform bed mobility with use of bed rail. CGA for STS transfer and ambulating to bathroom. CGA for toilet transfer to std height toilet. Remote supervision for hygiene with pt performing lateral lean. When pt attempted to turn and reach back for flusher handle, pt with very mild LOB but able to correct without assist. Grab bar to support STS from toilet. Would benefit from [facility] for increased height and rails to improve safety/indep with toilet transfers at home. HR up to 70 with exertional activity during session. Back down to 60bpm once returned to bed. RN notified. Continue to recommend Delta upon discharge to maximize pt's return to PLOF and minimize risk of falls, functional decline, and increased caregiver burden.    Follow Up Recommendations  Home health OT    Equipment Recommendations  3 in 1 bedside commode    Recommendations for Other Services      Precautions / Restrictions Precautions Precautions: Fall Precaution Comments: monitor HR throughout activity Restrictions Weight Bearing Restrictions: No       Mobility Bed Mobility Overal bed mobility: Modified Independent Bed Mobility: Supine to Sit;Sit to Supine     Supine to sit: Modified independent (Device/Increase time) Sit to supine: Modified independent (Device/Increase time)   General bed mobility comments: increased time/effort to perform but no physical assist  Transfers Overall transfer level: Needs assistance Equipment used: None Transfers: Sit to/from Stand Sit to Stand: Min guard               Balance Overall balance assessment: Needs assistance Sitting-balance support: Feet supported Sitting balance-Leahy Scale: Good     Standing balance support: No upper extremity supported Standing balance-Leahy Scale: Fair                             ADL either performed or assessed with clinical judgement   ADL Overall ADL's : Needs assistance/impaired                         Toilet Transfer: Min guard;Ambulation;Regular Toilet;Grab bars   Toileting- Clothing Manipulation and Hygiene: Supervision/safety;Sitting/lateral lean       Functional mobility during ADLs: Min guard       Vision Patient Visual Report: No change from baseline     Perception     Praxis      Cognition Arousal/Alertness: Awake/alert Behavior During Therapy: WFL for tasks assessed/performed Overall Cognitive Status: Within Functional Limits for tasks assessed                                          Exercises Other Exercises Other Exercises: Pt educated in falls prevention   Shoulder Instructions       General Comments HR 60 at rest in bed, up to 70 with ambulating and toileting task. Back down to 60 once back to bed; pt denied symptoms    Pertinent Vitals/ Pain  Pain Assessment: 0-10 Pain Score: 3  Pain Location: L wrist IV site Pain Descriptors / Indicators: Stabbing Pain Intervention(s): Limited activity within patient's tolerance;Monitored during session;Patient requesting pain meds-RN notified  Home Living                                          Prior Functioning/Environment              Frequency  Min 2X/week        Progress Toward Goals  OT Goals(current goals can now be found in the care plan section)  Progress towards OT goals: Progressing toward goals  Acute Rehab OT Goals Patient Stated Goal: to go home OT Goal Formulation: With patient Potential to Achieve Goals: Good  Plan  Discharge plan remains appropriate;Frequency remains appropriate    Co-evaluation                 AM-PAC OT "6 Clicks" Daily Activity     Outcome Measure   Help from another person eating meals?: None Help from another person taking care of personal grooming?: None Help from another person toileting, which includes using toliet, bedpan, or urinal?: A Little Help from another person bathing (including washing, rinsing, drying)?: A Little Help from another person to put on and taking off regular upper body clothing?: A Little Help from another person to put on and taking off regular lower body clothing?: A Lot 6 Click Score: 19    End of Session Equipment Utilized During Treatment: Gait belt  OT Visit Diagnosis: Muscle weakness (generalized) (M62.81)   Activity Tolerance Patient tolerated treatment well   Patient Left in bed;with call bell/phone within reach;with bed alarm set   Nurse Communication Patient requests pain meds        Time: 0017-4944 OT Time Calculation (min): 17 min  Charges: OT General Charges $OT Visit: 1 Visit OT Treatments $Self Care/Home Management : 8-22 mins  Jeni Salles, MPH, MS, OTR/L ascom [phone] 11/11/19, 10:08 AM

## 2019-11-11 NOTE — Consult Note (Signed)
Cardiology Consultation Note    Patient ID: Phillip Reyes, MRN: [id number], DOB/AGE: 01/05/1941 79 y.o. Admit date: 11/08/2019   Date of Consult: 11/11/2019 Primary Physician: [facility] [place] Primary Cardiologist: none  Chief Complaint: weakness Reason for Consultation: weaknes/bradycarda Requesting MD: Dr. Benny Lennert  HPI: Phillip Reyes is a 79 y.o. male with history of COPD, diabetes, hypertension, peripheral vascular disease and history of a CVA who was admitted via the emergency room on November 09, 2019 with altered mental status increased weakness and cough productive of yellowish sputum.  He denied fever and chills.  On presentation to the emergency room his heart rate was 51 respiratory rate was 22 temperature of 99.  He had acute on chronic renal insufficiency with a creatinine of 2.38 up from a baseline of approximately 2.  He was also anemic with a hemoglobin of 9.9.  High-sensitivity troponin was unremarkable.  U UA was unremarkable.  Noncontrasted head CT showed no acute abnormalities.  Chest x-ray showed mild cardiomegaly with possible atypical pneumonia versus mild anemia.  It showed a right lung base 12 mm nodule.  He was given empiric antibiotics.  Heart rate is remained in the 50s since admission.  Pulse ox is 99% on room air.  Hemodynamically stable.  ED showed sinus bradycardia at a rate of 52.  Patient is followed at the [facility].  He is a somewhat difficult historian but states he is currently scheduled for a pacemaker at the [place].  He is not aware of the timing of this.  He denies syncope.  He occasionally will get lightheaded.  Past Medical History:  Diagnosis Date  . Arthritis   . Cancer ([place])   . COPD (chronic obstructive pulmonary disease) ([place])   . Diabetes mellitus without complication ([place])   . Hypertension   . Renal disorder   . Stroke [facility])       Surgical History:  Past Surgical History:  Procedure Laterality Date  . APPENDECTOMY    . THROAT  SURGERY       Home Meds: Prior to Admission medications   Medication Sig Start Date End Date Taking? Authorizing Provider  acetaminophen (TYLENOL) 325 MG tablet Take 650 mg by mouth every 6 (six) hours as needed.   Yes [provider]  aspirin EC 81 MG tablet Take 81 mg by mouth daily.   Yes [provider]  atorvastatin (LIPITOR) 80 MG tablet Take 80 mg by mouth at bedtime.   Yes [provider]  azelastine (ASTELIN) 0.1 % nasal spray Place 1 spray into both nostrils 2 (two) times daily as needed for rhinitis.   Yes [provider]  budesonide-formoterol (SYMBICORT) 160-4.5 MCG/ACT inhaler Inhale 2 puffs into the lungs 2 (two) times daily.   Yes [provider]  carboxymethylcellul-glycerin (OPTIVE) 0.5-0.9 % ophthalmic solution Place 1 drop into both eyes 4 (four) times daily.   Yes [provider]  Cholecalciferol (VITAMIN D) 50 MCG (2000 UT) CAPS Take 1 capsule by mouth daily.   Yes [provider]  citalopram (CELEXA) 20 MG tablet Take 20 mg by mouth daily.   Yes [provider]  gabapentin (NEURONTIN) 300 MG capsule Take 300 mg by mouth daily.    Yes [provider]  insulin glargine (LANTUS) 100 UNIT/ML injection Inject 10 Units into the skin at bedtime.    Yes [provider]  insulin regular (NOVOLIN R) 100 units/mL injection Inject 8 Units into the skin 3 (three) times daily  before meals.   Yes [provider]  ipratropium-albuterol (DUONEB) 0.5-2.5 (3) MG/3ML SOLN Take 3 mLs by nebulization every 6 (six) hours as needed.   Yes [provider]  lisinopril (ZESTRIL) 20 MG tablet Take 20 mg by mouth daily.    Yes [provider]  omeprazole (PRILOSEC) 20 MG capsule Take 20 mg by mouth daily.   Yes [provider]  tamsulosin (FLOMAX) 0.4 MG CAPS capsule Take 0.8 mg by mouth at bedtime.   Yes [provider]  traZODone (DESYREL) 50 MG tablet Take 50 mg by  mouth at bedtime.   Yes [provider]  vitamin B-12 (CYANOCOBALAMIN) 1000 MCG tablet Take 1,000 mcg by mouth daily.   Yes [provider]    Inpatient Medications:  . amLODipine  10 mg Oral Daily  . aspirin EC  81 mg Oral Daily  . atorvastatin  80 mg Oral QHS  . citalopram  20 mg Oral Daily  . doxazosin  4 mg Oral QHS  . enoxaparin (LOVENOX) injection  40 mg Subcutaneous Q24H  . gabapentin  400 mg Oral Daily  . guaiFENesin  600 mg Oral BID  . insulin aspart  0-9 Units Subcutaneous TID PC & HS  . insulin glargine  28 Units Subcutaneous QHS  . mometasone-formoterol  2 puff Inhalation BID  . pantoprazole  40 mg Oral Daily  . vitamin B-12  1,000 mcg Oral Daily  . Vitamin D (Ergocalciferol)  50,000 Units Oral Q7 days   . sodium chloride Stopped (11/09/19 1250)  . azithromycin 500 mg (11/10/19 2226)  . cefTRIAXone (ROCEPHIN)  IV 2 g (11/10/19 2337)    Allergies:  Allergies  Allergen Reactions  . Norco [Hydrocodone-Acetaminophen] Shortness Of Breath    Social History   Socioeconomic History  . Marital status: Married    Spouse name: Not on file  . Number of children: Not on file  . Years of education: Not on file  . Highest education level: Not on file  Occupational History  . Not on file  Tobacco Use  . Smoking status: Current Every Day Smoker    Packs/day: 0.50    Years: 61.00    Pack years: 30.50    Types: Cigarettes  . Smokeless tobacco: Never Used  Vaping Use  . Vaping Use: Never used  Substance and Sexual Activity  . Alcohol use: No  . Drug use: No  . Sexual activity: Not on file  Other Topics Concern  . Not on file  Social History Narrative  . Not on file   Social Determinants of Health   Financial Resource Strain:   . Difficulty of Paying Living Expenses:   Food Insecurity:   . Worried About Charity fundraiser in the Last Year:   . Arboriculturist in the Last Year:   Transportation Needs:   . Film/video editor (Medical):    Marland Kitchen Lack of Transportation (Non-Medical):   Physical Activity:   . Days of Exercise per Week:   . Minutes of Exercise per Session:   Stress:   . Feeling of Stress :   Social Connections:   . Frequency of Communication with Friends and Family:   . Frequency of Social Gatherings with Friends and Family:   . Attends Religious Services:   . Active Member of Clubs or Organizations:   . Attends Archivist Meetings:   Marland Kitchen Marital Status:   Intimate Partner Violence:   . Fear of Current or  Ex-Partner:   . Emotionally Abused:   Marland Kitchen Physically Abused:   . Sexually Abused:      Family History  Problem Relation Age of Onset  . Congestive Heart Failure Father   . Other Mother        unknown medical history     Review of Systems: A 12-system review of systems was performed and is negative except as noted in the HPI.  Labs: No results for input(s): CKTOTAL, CKMB, TROPONINI in the last 72 hours. Lab Results  Component Value Date   WBC 6.1 11/11/2019   HGB 10.0 (L) 11/11/2019   HCT 28.6 (L) 11/11/2019   MCV 90.2 11/11/2019   PLT 163 11/11/2019    Recent Labs  Lab 11/11/19 0428  NA 142  K 4.0  CL 112*  CO2 23  BUN 28*  CREATININE 2.00*  CALCIUM 8.3*  GLUCOSE 67*   Lab Results  Component Value Date   CHOL 167 06/12/2015   HDL 23 (L) 06/12/2015   LDLCALC UNABLE TO CALCULATE IF TRIGLYCERIDE OVER 400 mg/dL 06/12/2015   TRIG 544 (H) 06/12/2015   No results found for: DDIMER  Radiology/Studies:  CT Head Wo Contrast  Result Date: 11/08/2019 CLINICAL DATA:  79 year old male with altered mental status. EXAM: CT HEAD WITHOUT CONTRAST TECHNIQUE: Contiguous axial images were obtained from the base of the skull through the vertex without intravenous contrast. COMPARISON:  Head CT dated 03/09/2016. FINDINGS: Brain: There is mild-to-moderate age-related atrophy and chronic microvascular ischemic changes. There is no acute intracranial hemorrhage. No mass effect or midline shift.  No extra-axial fluid collection. Vascular: No hyperdense vessel or unexpected calcification. Skull: Normal. Negative for fracture or focal lesion. Sinuses/Orbits: No acute finding. Other: None IMPRESSION: 1. No acute intracranial pathology. 2. Age-related atrophy and chronic microvascular ischemic changes. Electronically Signed   By: Anner Crete M.D.   On: 11/08/2019 20:57   DG Chest Portable 1 View  Result Date: 11/08/2019 CLINICAL DATA:  79 year old male with altered mental status. EXAM: PORTABLE CHEST 1 VIEW COMPARISON:  Chest radiograph dated 01/27/2019. FINDINGS: There is mild cardiomegaly. Diffuse interstitial and vascular prominence may represent edema. Atypical pneumonia is not excluded clinical correlation is recommended. No focal consolidation, pleural effusion, or pneumothorax. A 12 mm apparent nodular density at the right lung base, seen only on 1 of the views is indeterminate and may represent nipple shadow or a focal interstitial confluence. Repeat radiograph with placement of nipple markers may provide better evaluation. Atherosclerotic calcification of the aorta. No acute osseous pathology. IMPRESSION: 1. Mild cardiomegaly with possible mild edema. Atypical pneumonia is not excluded. 2. A 12 mm apparent nodular density at the right lung base. Attention on follow-up imaging recommended. Electronically Signed   By: Anner Crete M.D.   On: 11/08/2019 23:59    Wt Readings from Last 3 Encounters:  11/08/19 90.7 kg  01/27/19 73.9 kg  04/09/18 76.7 kg    EKG: Sinus bradycardia  Physical Exam: Caucasian male in no acute distress Blood pressure (!) 120/43, pulse (!) 51, temperature 98.2 F (36.8 C), resp. rate 16, height 5\' 10"  (1.778 m), weight 90.7 kg, SpO2 99 %. Body mass index is 28.7 kg/m. General: Well developed, well nourished, in no acute distress. Head: Normocephalic, atraumatic, sclera non-icteric, no xanthomas, nares are without discharge.  Neck: Negative for carotid  bruits. JVD not elevated. Lungs: Clear bilaterally to auscultation without wheezes, rales, or rhonchi. Breathing is unlabored. Heart: RRR with S1 S2. No murmurs, rubs, or gallops appreciated.  Abdomen: Soft, non-tender, non-distended with normoactive bowel sounds. No hepatomegaly. No rebound/guarding. No obvious abdominal masses. Msk:  Strength and tone appear normal for age. Extremities: No clubbing or cyanosis. No edema.  Distal pedal pulses are 2+ and equal bilaterally. Neuro: Alert and oriented X 3. No facial asymmetry. No focal deficit. Moves all extremities spontaneously. Psych:  Responds to questions appropriately with a normal affect.     Assessment and Plan  79 year old male with history of COPD, diabetes, hypertension, peripheral vascular disease and history of CVA who was admitted with altered mental status with a cough.  He was mildly anemic and had what appeared to be acute on chronic renal insufficiency.  His electrocardiogram revealed sinus bradycardia.  Review of telemetry since admission showed no pauses.  Predominant heart rate in the low to mid 50s.  He states he is being followed at the [facility] in [place].  He states he has been scheduled for a pacemaker there.  He is not aware of the timing of this.  Again he has had no syncope.  Will attempt to discuss with his cardiologist at the [facility].  At this point given no prolonged pauses with persistent rates in the 50s, that this does not appear to be acute change.  Recommendation initially will be to follow his renal insufficiency and maintain ideal electrolytes.  Would continue with empiric antibiotics as we will need to make certain he is not infected prior to pacemaker.  Will discuss with his team at the [place] regarding timing.  Again this does not appear to be an acute change in his status.  He defers getting a pacemaker here if at all possible.  Signed, Teodoro Spray MD 11/11/2019, 8:17 AM Pager: [phone]

## 2019-11-11 NOTE — TOC Initial Note (Signed)
Transition of Care [facility]) - Initial/Assessment Note    Patient Details  Name: Phillip Reyes MRN: [id number] Date of Birth: January 03, 1941  Transition of Care [facility]) CM/SW Contact:    Shelbie Hutching, RN Phone Number: 11/11/2019, 11:01 AM  Clinical Narrative:                 Patient admitted to the hospital with community acquired pneumonia, patient has also been bradycardic since admission.  Patient is from home with his wife.  Patient reports that he is independent at home and he and his wife have a caregiver that comes in M-F from 29 am to 5pm.  On the weekends the patient's wife's daughter comes over to help.  The Caregiver cooks and provides transportation for the patient and his wife.   Patient goes to the [facility] and is followed by cardiology there.  Patient would like to transfer to the [place] if continued hospital care is needed.  This RNCM will send clinical information over to the [place] and request transfer.    Expected Discharge Plan: [place] Barriers to Discharge: Continued Medical Work up   Patient Goals and CMS Choice Patient states their goals for this hospitalization and ongoing recovery are:: If he needs to have anything done he would like to be transfered to the [email or web address].gov Compare Post Acute Care list provided to:: Patient Choice offered to / list presented to : Patient  Expected Discharge Plan and Services Expected Discharge Plan: [place]   Discharge Planning Services: CM Consult Post Acute Care Choice: [place] arrangements for the past 2 months: Single Family Home                           HH Arranged: PT, OT          Prior Living Arrangements/Services Living arrangements for the past 2 months: Single Family Home Lives with:: Spouse Patient language and need for interpreter reviewed:: Yes Do you feel safe going back to the place where you live?: Yes      Need for Family Participation in Patient  Care: Yes (Comment) Care giver support system in place?: Yes (comment) (daughter, wife) Current home services: DME, Homehealth aide (cane, walker) Criminal Activity/Legal Involvement Pertinent to Current Situation/Hospitalization: No - Comment as needed  Activities of Daily Living Home Assistive Devices/Equipment: Cane (specify quad or straight), Walker (specify type) ADL Screening (condition at time of admission) Patient's cognitive ability adequate to safely complete daily activities?: Yes Is the patient deaf or have difficulty hearing?: No Does the patient have difficulty seeing, even when wearing glasses/contacts?: No Does the patient have difficulty concentrating, remembering, or making decisions?: No Patient able to express need for assistance with ADLs?: Yes Does the patient have difficulty dressing or bathing?: Yes Independently performs ADLs?: No Does the patient have difficulty walking or climbing stairs?: Yes Weakness of Legs: None Weakness of Arms/Hands: None  Permission Sought/Granted Permission sought to share information with : Case Manager, Chartered certified accountant granted to share information with : Yes, Verbal Permission Granted     Permission granted to share info w AGENCY: [employer]        Emotional Assessment Appearance:: Appears stated age Attitude/Demeanor/Rapport: Engaged Affect (typically observed): Accepting Orientation: : Oriented to Self, Oriented to Place, Oriented to  Time, Oriented to Situation Alcohol / Substance Use: Not Applicable Psych Involvement: No (comment)  Admission diagnosis:  CAP (community acquired pneumonia) [J18.9] Altered mental status, unspecified altered mental status type [R41.82] Pneumonia due to infectious organism, unspecified laterality, unspecified part of lung [J18.9] Patient Active Problem List   Diagnosis Date Noted  . CAP (community acquired pneumonia) 01/27/2019  . Altered mental status 03/09/2016   . Acute encephalopathy 06/11/2015  . Type 2 diabetes mellitus ([place]) 06/11/2015  . HTN (hypertension) 06/11/2015  . COPD (chronic obstructive pulmonary disease) ([place]) 06/11/2015  . CKD (chronic kidney disease), stage III 06/11/2015  . H/O: stroke 06/11/2015   PCP:  [facility], [place]:   Harlan Wall, [address] [facility] [address] [facility] [place] [address] Phone: [phone] Fax: [place], [place] [address] Phone: [phone] Fax: [phone]     Social Determinants of Health (SDOH) Interventions    Readmission Risk Interventions No flowsheet data found.

## 2019-11-11 NOTE — Progress Notes (Signed)
Inpatient Diabetes Program Recommendations  AACE/ADA: New Consensus Statement on Inpatient Glycemic Control   Target Ranges:  Prepandial:   less than 140 mg/dL      Peak postprandial:   less than 180 mg/dL (1-2 hours)      Critically ill patients:  140 - 180 mg/dL   Results for SHIVANK, PINEDO (MRN [id number]) as of 11/11/2019 09:31  Ref. Range 11/10/2019 08:31 11/10/2019 14:46 11/10/2019 17:11 11/10/2019 21:21 11/11/2019 08:17  Glucose-Capillary Latest Ref Range: 70 - 99 mg/dL 93 194 (H) 138 (H) 168 (H) 76   Review of Glycemic Control  Current orders for Inpatient glycemic control: Lantus 28 units QHS, Novolog 0-9 units TID with meals and HS  Inpatient Diabetes Program Recommendations:   Insulin - Basal: Please consider decreasing Lantus to 25 units QHS.  Thanks, Barnie Alderman, RN, MSN, CDE Diabetes Coordinator Inpatient Diabetes Program [phone] (Team Pager from 8am to 5pm)

## 2019-11-11 NOTE — Plan of Care (Signed)

## 2019-11-11 NOTE — TOC Transition Note (Signed)
Transition of Care [facility]) - CM/SW Discharge Note   Patient Details  Name: Phillip Reyes MRN: [id number] Date of Birth: 1940-12-26  Transition [facility] Care [facility]) CM/SW Contact:  Shelbie Hutching, RN Phone Number: 11/11/2019, 12:33 PM   Clinical Narrative:     Patient has been cleared by cardiology and the hospitalist.  Patient will discharge home with home health PT and OT through [facility], .  The [facility] is aware of discharge and they will schedule follow up for pacemaker placement.  Patient's caregiver will be coming to pick him up.   Final next level of care: [place] Barriers to Discharge: Barriers Resolved   Patient Goals and CMS Choice Patient states their goals for this hospitalization and ongoing recovery are:: If he needs to have anything done he would like to be transfered to the [email or web address] Compare Post Acute Care list provided to:: Patient Choice offered to / list presented to : Patient  Discharge Placement                       Discharge Plan and Services   Discharge Planning Services: CM Consult Post Acute Care Choice: Home Health                    HH Arranged: PT, OT [facility] Agency: [place] Date [place]: 11/11/19 Time [place]: [id number] Representative spoke with at [place]: [place] ([place]) Interventions     Readmission Risk Interventions No flowsheet data found.

## 2019-11-11 NOTE — Discharge Summary (Signed)
Physician Discharge Summary  Phillip Reyes KGM:[id number] DOB: [birth year] DOA: 11/08/2019  PCP: [facility]  Admit date: 11/08/2019 Discharge date: 11/11/2019  Recommendations for Outpatient Follow-up:  1. Discharge to home with home health. 2. Follow up with cardiology at [facility] at next opportunity for pacemaker placement. 3. Follow up with PCP in 7-10 days.  Discharge Diagnoses: Principal diagnosis is #1 1. Community acquired pneumonia 2. Symptomatic pneumonia 3. COPD 4. 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 Acute on chronic anemia 6. Hypertension 7. Hyperlipidemia 8. DM II 9.  Depression  Discharge Condition: Fair  Disposition: Home with home health  Diet recommendation:Heart healthy/Carbohydrate modified  Filed Weights   11/08/19 1944  Weight: 90.7 kg   History of present illness:  Phillip Reyes  is a 79 y.o. male with a known history of COPD, type 2 diabetes mellitus, hypertension, peripheral vascular disease and CVA, who presented to the emergency room with acute onset of altered mental status with recent generalized weakness over the last week with associated cough productive of yellowish sputum as well as dyspnea and wheezing.  He denied any fever or chills however he stated that he stays cold.  He admitted to dysuria without urinary urgency or frequency or flank pain.  No nausea or vomiting or diarrhea. No abdominal pain or melena or bright red bleeding per rectum.  Upon presentation to the emergency room, heart rate was 51 and respiratory rate 22 and temperature 99.  Blood pressure 1 was then 195/70.  Labs revealed a BUN of 15 creatinine of 2.38 higher than previous levels of 22 and 1.9.  CBC showed anemia with hemoglobin of 9.9 hematocrit 29.7 compared to 12/36.4 on 01/27/2019.  High-sensitivity troponin was 10 and later the same.  UA was unremarkable.  Noncontrasted head CT scan showed no acute intracranial abnormalities.  It showed age-related atrophy and chronic microvascular  ischemic changes.  Chest x-ray showed mild cardiomegaly and possible mild edema with atypical pneumonia not excluded.  It showed right lung base 12 mm nodular opacity with recommendation for follow-up imaging.  The patient was given IV Rocephin, Zithromax as well as 1 L bolus of IV normal saline.  He will be admitted to a medically monitored bed for further evaluation and management.  Hospital Course:  The patient is a 79 yr old man who presented to [facility] ED on 11/08/2019 with complaints of progressive weakness and acute on set of altered mental status. The patient had complained of cough productive or yellow sputum. He denied fevers, chills, nausea, or vomiting.   The patient carries a past medical history significant for COPD, DM II, hypertension, peripheral vascular disease, and CVA.   In the ED the patient had a normal temperature, respiratory rate, and a slightly hypertensive blood pressure. He was saturating 95-100% on room air. Heart rates have been almost entirely in the low 50's with a couple of drops into the 40's and one high of 62 since he has been here.  The patient was admitted to a telemetry bed early this morning by my colleague, Dr. Sidney Ace. He is being treated for CAP with Rocephin and Azithromycin.   This morning the patient is oriented. He states that he is feeling better. He continues to cough up sputum. Heart and lung sounds are within normal limits. Abdomen is soft, non-tender, and non-distended.  The patient has been evaluated by PT/OT. They have recommended that the patient be discharged to home with home health PT/OT. Cardiology has been asked to evaluate the patient  for symptomatic bradycardia. Per the patient his cardiologist at the [facility] plans to place a pacemaker.   Dr. Ubaldo Glassing has evaluated the patient this morning. He feels that his bradycardia is not an urgent matter. As the patient's renal status is at baseline, and the patient has been cleared for discharge to home with  [facility] PT/OT, he will be discharged to home in fair condition.  Today's assessment: S: The patient is resting comfortably. No new complaints. O: Vitals:  Vitals:   11/11/19 0740 11/11/19 1135  BP: (!) 120/43 (!) 149/54  Pulse: (!) 51 (!) 52  Resp: 16 16  Temp: 98.2 F (36.8 C) 98.5 F (36.9 C)  SpO2: 99% 100%   Constitutional:   The patient is awake, alert, and oriented x 3. No acute distress. Respiratory:   No increased work of breathing.  No wheezes, rales, or rhonchi  No tactile fremitus Cardiovascular:   Regular rate and rhythm  No murmurs, ectopy, or gallups.  No lateral PMI. No thrills. Abdomen:   Abdomen is soft, non-tender, non-distended  No hernias, masses, or organomegaly  Normoactive bowel sounds.  Musculoskeletal:   No cyanosis, clubbing, or edema Skin:   No rashes, lesions, ulcers  palpation of skin: no induration or nodules Neurologic:   CN 2-12 intact  Sensation all 4 extremities intact Psychiatric:   Mental status ? Mood, affect appropriate ? Orientation to person, place, time   judgment and insight appear intact  Discharge Instructions  Discharge Instructions    Activity as tolerated - No restrictions   Complete by: As directed    Activity as tolerated - No restrictions   Complete by: As directed    Call MD for:  difficulty breathing, headache or visual disturbances   Complete by: As directed    Call MD for:  extreme fatigue   Complete by: As directed    Call MD for:  persistant dizziness or light-headedness   Complete by: As directed    Call MD for:  persistant nausea and vomiting   Complete by: As directed    Call MD for:  severe uncontrolled pain   Complete by: As directed    Call MD for:  temperature >100.4   Complete by: As directed    Diet - low sodium heart healthy   Complete by: As directed    Discharge instructions   Complete by: As directed    Discharge to home with home health. Follow up with cardiology at [facility]  at next opportunity for pacemaker placement. Follow up with PCP in 7-10 days.   Increase activity slowly   Complete by: As directed    Increase activity slowly   Complete by: As directed      Allergies as of 11/11/2019      Reactions   Norco [hydrocodone-acetaminophen] Shortness Of Breath      Medication List    STOP taking these medications   lisinopril 20 MG tablet Commonly known as: ZESTRIL     TAKE these medications   acetaminophen 325 MG tablet Commonly known as: TYLENOL Take 650 mg by mouth every 6 (six) hours as needed.   amLODipine 10 MG tablet Commonly known as: NORVASC Take 1 tablet (10 mg total) by mouth daily.   amoxicillin-clavulanate 875-125 MG tablet Commonly known as: Augmentin Take 1 tablet by mouth 2 (two) times daily for 5 days.   aspirin EC 81 MG tablet Take 81 mg by mouth daily.   atorvastatin 80 MG tablet Commonly known as: LIPITOR  Take 80 mg by mouth at bedtime.   azelastine 0.1 % nasal spray Commonly known as: ASTELIN Place 1 spray into both nostrils 2 (two) times daily as needed for rhinitis.   azithromycin 500 MG tablet Commonly known as: Zithromax Take 1 tablet (500 mg total) by mouth daily for 3 days. Take 1 tablet daily for 3 days.   budesonide-formoterol 160-4.5 MCG/ACT inhaler Commonly known as: SYMBICORT Inhale 2 puffs into the lungs 2 (two) times daily.   citalopram 20 MG tablet Commonly known as: CELEXA Take 20 mg by mouth daily.   doxazosin 4 MG tablet Commonly known as: CARDURA Take 1 tablet (4 mg total) by mouth at bedtime.   gabapentin 300 MG capsule Commonly known as: NEURONTIN Take 300 mg by mouth daily.   guaiFENesin 600 MG 12 hr tablet Commonly known as: MUCINEX Take 1 tablet (600 mg total) by mouth 2 (two) times daily.   insulin glargine 100 UNIT/ML injection Commonly known as: LANTUS Inject 10 Units into the skin at bedtime.   insulin regular 100 units/mL injection Commonly known as: NOVOLIN R Inject 8  Units into the skin 3 (three) times daily before meals.   ipratropium-albuterol 0.5-2.5 (3) MG/3ML Soln Commonly known as: DUONEB Take 3 mLs by nebulization every 6 (six) hours as needed.   omeprazole 20 MG capsule Commonly known as: PRILOSEC Take 20 mg by mouth daily.   OPTIVE 0.5-0.9 % ophthalmic solution Generic drug: carboxymethylcellul-glycerin Place 1 drop into both eyes 4 (four) times daily.   tamsulosin 0.4 MG Caps capsule Commonly known as: FLOMAX Take 0.8 mg by mouth at bedtime.   traZODone 50 MG tablet Commonly known as: DESYREL Take 50 mg by mouth at bedtime.   vitamin B-12 1000 MCG tablet Commonly known as: CYANOCOBALAMIN Take 1,000 mcg by mouth daily.   Vitamin D (Ergocalciferol) 1.25 MG (50000 UNIT) Caps capsule Commonly known as: DRISDOL Take 1 capsule (50,000 Units total) by mouth every 7 (seven) days.   Vitamin D 50 MCG (2000 UT) Caps Take 1 capsule by mouth daily.      Allergies  Allergen Reactions  . Norco [Hydrocodone-Acetaminophen] Shortness Of Breath    The results of significant diagnostics from this hospitalization (including imaging, microbiology, ancillary and laboratory) are listed below for reference.    Significant Diagnostic Studies: CT Head Wo Contrast  Result Date: 11/08/2019 CLINICAL DATA:  79 year old male with altered mental status. EXAM: CT HEAD WITHOUT CONTRAST TECHNIQUE: Contiguous axial images were obtained from the base of the skull through the vertex without intravenous contrast. COMPARISON:  Head CT dated 03/09/2016. FINDINGS: Brain: There is mild-to-moderate age-related atrophy and chronic microvascular ischemic changes. There is no acute intracranial hemorrhage. No mass effect or midline shift. No extra-axial fluid collection. Vascular: No hyperdense vessel or unexpected calcification. Skull: Normal. Negative for fracture or focal lesion. Sinuses/Orbits: No acute finding. Other: None IMPRESSION: 1. No acute intracranial  pathology. 2. Age-related atrophy and chronic microvascular ischemic changes. Electronically Signed   By: Anner Crete M.D.   On: 11/08/2019 20:57   DG Chest Portable 1 View  Result Date: 11/08/2019 CLINICAL DATA:  79 year old male with altered mental status. EXAM: PORTABLE CHEST 1 VIEW COMPARISON:  Chest radiograph dated 01/27/2019. FINDINGS: There is mild cardiomegaly. Diffuse interstitial and vascular prominence may represent edema. Atypical pneumonia is not excluded clinical correlation is recommended. No focal consolidation, pleural effusion, or pneumothorax. A 12 mm apparent nodular density at the right lung base, seen only on 1 of the views is  indeterminate and may represent nipple shadow or a focal interstitial confluence. Repeat radiograph with placement of nipple markers may provide better evaluation. Atherosclerotic calcification of the aorta. No acute osseous pathology. IMPRESSION: 1. Mild cardiomegaly with possible mild edema. Atypical pneumonia is not excluded. 2. A 12 mm apparent nodular density at the right lung base. Attention on follow-up imaging recommended. Electronically Signed   By: Anner Crete M.D.   On: 11/08/2019 23:59    Microbiology: Recent Results (from the past 240 hour(s))  SARS Coronavirus 2 by RT PCR (hospital order, performed in [facility] hospital lab) Nasopharyngeal Nasopharyngeal Swab     Status: None   Collection Time: 11/09/19 12:51 AM   Specimen: Nasopharyngeal Swab  Result Value Ref Range Status   SARS Coronavirus 2 NEGATIVE NEGATIVE Final    Comment: (NOTE) SARS-CoV-2 target nucleic acids are NOT DETECTED.  The SARS-CoV-2 RNA is generally detectable in upper and lower respiratory specimens during the acute phase of infection. The lowest concentration of SARS-CoV-2 viral copies this assay can detect is 250 copies / mL. A negative result does not preclude SARS-CoV-2 infection and should not be used as the sole basis for treatment or other patient  management decisions.  A negative result may occur with improper specimen collection / handling, submission of specimen other than nasopharyngeal swab, presence of viral mutation(s) within the areas targeted by this assay, and inadequate number of viral copies (<250 copies / mL). A negative result must be combined with clinical observations, patient history, and epidemiological information.  Fact Sheet for Patients:   StrictlyIdeas.no  Fact Sheet for Healthcare Providers: BankingDealers.co.za  This test is not yet approved or  cleared by the [place] FDA and has been authorized for detection and/or diagnosis of SARS-CoV-2 by FDA under an Emergency Use Authorization (EUA).  This EUA will remain in effect (meaning this test can be used) for the duration of the COVID-19 declaration under Section 564(b)(1) of the Act, 21 U.S.C. section 360bbb-3(b)(1), unless the authorization is terminated or revoked sooner.  Performed at [facility], [address] [place], [place] [address]   Culture, sputum-assessment     Status: None   Collection Time: 11/09/19 10:56 AM   Specimen: Sputum  Result Value Ref Range Status   Specimen Description SPUTUM  Final   Special Requests NONE  Final   Sputum evaluation   Final    THIS SPECIMEN IS ACCEPTABLE FOR SPUTUM CULTURE Performed at [facility], [address] [place], [place] [address]    Report Status 11/09/2019 FINAL  Final  Culture, respiratory     Status: None (Preliminary result)   Collection Time: 11/09/19 10:56 AM   Specimen: SPU  Result Value Ref Range Status   Specimen Description   Final    SPUTUM Performed at [facility], [address], [place], [place] [address]    Special Requests   Final    NONE Reflexed from [id number] Performed at [facility], [address] [place], [place] [address]    Gram Stain   Final    RARE WBC PRESENT,  PREDOMINANTLY PMN RARE GRAM POSITIVE COCCI IN CHAINS RARE GRAM NEGATIVE RODS    Culture   Final    RARE Consistent with normal respiratory flora. Performed at [facility], [place] [address], [place], [place] [address]    Report Status PENDING  Incomplete     Labs: Basic Metabolic Panel: Recent Labs  Lab 11/08/19 1949 11/09/19 0228 11/10/19 6967  11/11/19 0428  NA 140 140 142 142  K 4.4 3.7 4.0 4.0  CL 111 112* 113* 112*  CO2 20* 19* 23 23  GLUCOSE 127* 90 89 67*  BUN 50* 44* 34* 28*  CREATININE 2.38* 2.01* 2.00* 2.00*  CALCIUM 8.3* 7.8* 8.2* 8.3*   Liver Function Tests: No results for input(s): AST, ALT, ALKPHOS, BILITOT, PROT, ALBUMIN in the last 168 hours. No results for input(s): LIPASE, AMYLASE in the last 168 hours. No results for input(s): AMMONIA in the last 168 hours. CBC: Recent Labs  Lab 11/08/19 1949 11/09/19 0228 11/11/19 0428  WBC 5.9 6.4 6.1  NEUTROABS 3.6  --   --   HGB 9.9* 10.2* 10.0*  HCT 29.7* 31.4* 28.6*  MCV 94.6 96.3 90.2  PLT 172 176 163   Cardiac Enzymes: No results for input(s): CKTOTAL, CKMB, CKMBINDEX, TROPONINI in the last 168 hours. BNP: BNP (last 3 results) Recent Labs    01/28/19 0100  BNP 227.0*    ProBNP (last 3 results) No results for input(s): PROBNP in the last 8760 hours.  CBG: Recent Labs  Lab 11/10/19 1446 11/10/19 1711 11/10/19 2121 11/11/19 0817 11/11/19 1148  GLUCAP 194* 138* 168* 76 220*    Active Problems:   CAP (community acquired pneumonia)   Time coordinating discharge: 38 minutes  Signed:        Meryl Ponder, DO [facility]  11/11/2019, 12:41 PM

## 2019-11-12 LAB — LEGIONELLA PNEUMOPHILA SEROGP 1 UR AG: L. pneumophila Serogp 1 Ur Ag: NEGATIVE

## 2019-11-20 ENCOUNTER — Ambulatory Visit
Admission: EM | Admit: 2019-11-20 | Discharge: 2019-11-20 | Disposition: A | Discharge status: Home / Self Care | Payer: No Typology Code available for payment source

## 2019-11-20 DIAGNOSIS — H6123 Impacted cerumen, bilateral: Secondary | ICD-10-CM | POA: Diagnosis not present

## 2019-11-20 NOTE — Discharge Instructions (Signed)
Keep on using Debrox drops for  7-10 days to soften the wax Call your ear nose and throat doctor to have it cleansed after that.

## 2019-11-20 NOTE — ED Provider Notes (Addendum)
[facility]    CSN: [id number] Arrival date & time: 11/20/19  1318      History   Chief Complaint Chief Complaint  Patient presents with  . Ear Fullness    HPI Phillip Reyes is a 79 y.o. male. who presents with L ear feeling blocked and has decreased hearing from this side. Has hx of chronic cerumen impaction and his care giver removed it last month.     Past Medical History:  Diagnosis Date  . Arthritis   . Cancer ([place])   . COPD (chronic obstructive pulmonary disease) ([place])   . Diabetes mellitus without complication ([place])   . Hypertension   . Renal disorder   . Stroke [facility])     Patient Active Problem List   Diagnosis Date Noted  . CAP (community acquired pneumonia) 01/27/2019  . Altered mental status 03/09/2016  . Acute encephalopathy 06/11/2015  . Type 2 diabetes mellitus ([place]) 06/11/2015  . HTN (hypertension) 06/11/2015  . COPD (chronic obstructive pulmonary disease) ([place]) 06/11/2015  . CKD (chronic kidney disease), stage III 06/11/2015  . H/O: stroke 06/11/2015    Past Surgical History:  Procedure Laterality Date  . APPENDECTOMY    . THROAT SURGERY         Home Medications    Prior to Admission medications   Medication Sig Start Date End Date Taking? Authorizing Provider  acetaminophen (TYLENOL) 325 MG tablet Take 650 mg by mouth every 6 (six) hours as needed.    [provider]  amLODipine (NORVASC) 10 MG tablet Take 1 tablet (10 mg total) by mouth daily. 11/11/19   Swayze, Ava, DO  aspirin EC 81 MG tablet Take 81 mg by mouth daily.    [provider]  atorvastatin (LIPITOR) 80 MG tablet Take 80 mg by mouth at bedtime.    [provider]  azelastine (ASTELIN) 0.1 % nasal spray Place 1 spray into both nostrils 2 (two) times daily as needed for rhinitis.    [provider]  budesonide-formoterol (SYMBICORT) 160-4.5 MCG/ACT inhaler Inhale 2 puffs into the lungs 2 (two) times daily.    [provider]  carboxymethylcellul-glycerin (OPTIVE) 0.5-0.9 % ophthalmic solution Place 1 drop into both eyes 4 (four) times daily.    [provider]  Cholecalciferol (VITAMIN D) 50 MCG (2000 UT) CAPS Take 1 capsule by mouth daily.    [provider]  citalopram (CELEXA) 20 MG tablet Take 20 mg by mouth daily.    [provider]  doxazosin (CARDURA) 4 MG tablet Take 1 tablet (4 mg total) by mouth at bedtime. 11/11/19   Swayze, Ava, DO  gabapentin (NEURONTIN) 300 MG capsule Take 300 mg by mouth daily.     [provider]  guaiFENesin (MUCINEX) 600 MG 12 hr tablet Take 1 tablet (600 mg total) by mouth 2 (two) times daily. 11/11/19   Swayze, Ava, DO  insulin glargine (LANTUS) 100 UNIT/ML injection Inject 10 Units into the skin at bedtime.     [provider]  insulin regular (NOVOLIN R) 100 units/mL injection Inject 8 Units into the skin 3 (three) times daily before meals.    [provider]  ipratropium-albuterol (DUONEB) 0.5-2.5 (3) MG/3ML SOLN Take 3 mLs by nebulization every 6 (six) hours as needed.    [provider]  omeprazole (PRILOSEC) 20 MG capsule Take 20 mg by mouth daily.    [provider]  tamsulosin (FLOMAX) 0.4 MG CAPS capsule Take 0.8 mg by mouth at  bedtime.    [provider]  traZODone (DESYREL) 50 MG tablet Take 50 mg by mouth at bedtime.    [provider]  vitamin B-12 (CYANOCOBALAMIN) 1000 MCG tablet Take 1,000 mcg by mouth daily.    [provider]  Vitamin D, Ergocalciferol, (DRISDOL) 1.25 MG (50000 UNIT) CAPS capsule Take 1 capsule (50,000 Units total) by mouth every 7 (seven) days. 11/11/19   Swayze, Ava, DO    Family History Family History  Problem Relation Age of Onset  . Congestive Heart Failure Father   . Other Mother        unknown medical history    Social History Social History   Tobacco Use  . Smoking status: Current Every Day Smoker    Packs/day: 0.50    Years: 61.00     Pack years: 30.50    Types: Cigarettes  . Smokeless tobacco: Never Used  Vaping Use  . Vaping Use: Never used  Substance Use Topics  . Alcohol use: No  . Drug use: No     Allergies   Norco [hydrocodone-acetaminophen]   Review of Systems Review of Systems  Constitutional: Negative for fever.  HENT: Positive for hearing loss. Negative for congestion, ear discharge, ear pain, postnasal drip and rhinorrhea.   Neurological: Negative for headaches.     Physical Exam Triage Vital Signs ED Triage Vitals  Enc Vitals Group     BP 11/20/19 1332 (!) 150/57     Pulse Rate 11/20/19 1332 (!) 50     Resp 11/20/19 1332 18     Temp 11/20/19 1332 98 F (36.7 C)     Temp Source 11/20/19 1332 Oral     SpO2 11/20/19 1332 98 %     Weight 11/20/19 1329 145 lb (65.8 kg)     Height 11/20/19 1329 5' 8.5" (1.74 m)     Head Circumference --      Peak Flow --      Pain Score 11/20/19 1329 0     Pain Loc --      Pain Edu? --      Excl. in [place]? --    No data found.  Updated Vital Signs BP (!) 150/57   Pulse (!) 50   Temp 98 F (36.7 C) (Oral)   Resp 18   Ht 5' 8.5" (1.74 m)   Wt 145 lb (65.8 kg)   SpO2 98%   BMI 21.73 kg/m   Visual Acuity Right Eye Distance:   Left Eye Distance:   Bilateral Distance:    Right Eye Near:   Left Eye Near:    Bilateral Near:     Physical Exam Vitals and nursing note reviewed.  Constitutional:      General: He is not in acute distress.    Appearance: He is not toxic-appearing.     Comments: Is hard of hearing  HENT:     Head: Atraumatic.     Right Ear: There is impacted cerumen.     Left Ear: There is impacted cerumen.     Ears:     Comments: R TM initially was not visible, but after failing lavate I was able to remove most of the wax from R canal and TM is normal. I repeated the lavage on his L ear after the MA who tried, but I could not get anything out. I also tried using a curette, but the was is hard and deep.  Eyes:     General: No  scleral  icterus.    Conjunctiva/sclera: Conjunctivae normal.  Pulmonary:     Effort: Pulmonary effort is normal.  Musculoskeletal:        General: Normal range of motion.     Cervical back: Neck supple.  Skin:    General: Skin is warm and dry.     Findings: No rash.  Neurological:     Mental Status: He is alert and oriented to person, place, and time.  Psychiatric:        Mood and Affect: Mood normal.        Behavior: Behavior normal.        Thought Content: Thought content normal.        Judgment: Judgment normal.      UC Treatments / Results  Labs (all labs ordered are listed, but only abnormal results are displayed) Labs Reviewed - No data to display  EKG   Radiology No results found.  Procedures Procedures (including critical care time)  Medications Ordered in UC Medications - No data to display  Initial Impression / Assessment and Plan / UC Course  I have reviewed the triage vital signs and the nursing notes. Advised to see his ENT after using debrox for 7-10 days for L ear wax removal.  20 minutes spent trying to lavage the ears by MA and me Final Clinical Impressions(s) / UC Diagnoses   Final diagnoses:  None   Discharge Instructions   None    ED Prescriptions    None     PDMP not reviewed this encounter.   Shelby Mattocks, PA-C 11/20/19 [place], Sunday Spillers, PA-C 11/20/19 1449

## 2019-11-20 NOTE — ED Triage Notes (Signed)
Pt reports L ear "has been stopped up for a week". Pt reports not being able to hear as well since his ear has been stopped up.

## 2020-01-13 ENCOUNTER — Other Ambulatory Visit: Payer: Self-pay

## 2020-01-13 ENCOUNTER — Ambulatory Visit
Admission: EM | Admit: 2020-01-13 | Discharge: 2020-01-13 | Disposition: A | Discharge status: Home / Self Care | Payer: No Typology Code available for payment source | Attending: Family Medicine | Admitting: Family Medicine

## 2020-01-13 DIAGNOSIS — F17210 Nicotine dependence, cigarettes, uncomplicated: Secondary | ICD-10-CM | POA: Diagnosis not present

## 2020-01-13 DIAGNOSIS — Z885 Allergy status to narcotic agent status: Secondary | ICD-10-CM | POA: Insufficient documentation

## 2020-01-13 DIAGNOSIS — J40 Bronchitis, not specified as acute or chronic: Secondary | ICD-10-CM | POA: Insufficient documentation

## 2020-01-13 DIAGNOSIS — Z20822 Contact with and (suspected) exposure to covid-19: Secondary | ICD-10-CM | POA: Insufficient documentation

## 2020-01-13 DIAGNOSIS — Z8673 Personal history of transient ischemic attack (TIA), and cerebral infarction without residual deficits: Secondary | ICD-10-CM | POA: Diagnosis not present

## 2020-01-13 DIAGNOSIS — E1122 Type 2 diabetes mellitus with diabetic chronic kidney disease: Secondary | ICD-10-CM | POA: Insufficient documentation

## 2020-01-13 DIAGNOSIS — Z794 Long term (current) use of insulin: Secondary | ICD-10-CM | POA: Diagnosis not present

## 2020-01-13 DIAGNOSIS — J449 Chronic obstructive pulmonary disease, unspecified: Secondary | ICD-10-CM | POA: Insufficient documentation

## 2020-01-13 DIAGNOSIS — N183 Chronic kidney disease, stage 3 unspecified: Secondary | ICD-10-CM | POA: Insufficient documentation

## 2020-01-13 DIAGNOSIS — M1990 Unspecified osteoarthritis, unspecified site: Secondary | ICD-10-CM | POA: Insufficient documentation

## 2020-01-13 DIAGNOSIS — Z79899 Other long term (current) drug therapy: Secondary | ICD-10-CM | POA: Insufficient documentation

## 2020-01-13 DIAGNOSIS — Z7951 Long term (current) use of inhaled steroids: Secondary | ICD-10-CM | POA: Insufficient documentation

## 2020-01-13 DIAGNOSIS — Z9049 Acquired absence of other specified parts of digestive tract: Secondary | ICD-10-CM | POA: Insufficient documentation

## 2020-01-13 DIAGNOSIS — Z7982 Long term (current) use of aspirin: Secondary | ICD-10-CM | POA: Diagnosis not present

## 2020-01-13 DIAGNOSIS — I129 Hypertensive chronic kidney disease with stage 1 through stage 4 chronic kidney disease, or unspecified chronic kidney disease: Secondary | ICD-10-CM | POA: Insufficient documentation

## 2020-01-13 DIAGNOSIS — Z8249 Family history of ischemic heart disease and other diseases of the circulatory system: Secondary | ICD-10-CM | POA: Diagnosis not present

## 2020-01-13 MED ORDER — DOXYCYCLINE HYCLATE 100 MG PO CAPS
100.0000 mg | ORAL_CAPSULE | Freq: Two times a day (BID) | ORAL | 0 refills | Status: DC
Start: 2020-01-13 — End: 2021-06-22

## 2020-01-13 NOTE — ED Provider Notes (Signed)
[facility]    CSN: [id number] Arrival date & time: 01/13/20  1406      History   Chief Complaint Chief Complaint  Patient presents with  . Cough    HPI Phillip Reyes is a 79 y.o. male who presents with hx of productive cough x 1 1/2 weeks off and on. Seems he was getting better but got worse 3 days ago. Care giver tried to give him a neb treatment, but pt declined.  He has been around his daughter who was diagnosed with bacterial pneumonia, but does not think she has covid. Had defibrillator and pace maker placed on 8/18. His care giver assisted with the history. He has been more fatigued in the past 3 days.   Past Medical History:  Diagnosis Date  . Arthritis   . Cancer ([place])   . COPD (chronic obstructive pulmonary disease) ([place])   . Diabetes mellitus without complication ([place])   . Hypertension   . Renal disorder   . Stroke [facility])     Patient Active Problem List   Diagnosis Date Noted  . CAP (community acquired pneumonia) 01/27/2019  . Altered mental status 03/09/2016  . Acute encephalopathy 06/11/2015  . Type 2 diabetes mellitus ([place]) 06/11/2015  . HTN (hypertension) 06/11/2015  . COPD (chronic obstructive pulmonary disease) ([place]) 06/11/2015  . CKD (chronic kidney disease), stage III 06/11/2015  . H/O: stroke 06/11/2015    Past Surgical History:  Procedure Laterality Date  . APPENDECTOMY    . THROAT SURGERY       Home Medications    Prior to Admission medications   Medication Sig Start Date End Date Taking? Authorizing Provider  acetaminophen (TYLENOL) 325 MG tablet Take 650 mg by mouth every 6 (six) hours as needed.   Yes [provider]  amLODipine (NORVASC) 10 MG tablet Take 1 tablet (10 mg total) by mouth daily. 11/11/19  Yes Swayze, Ava, DO  aspirin EC 81 MG tablet Take 81 mg by mouth daily.   Yes [provider]  atorvastatin (LIPITOR) 80 MG tablet Take 80 mg by mouth at bedtime.   Yes [provider]  azelastine  (ASTELIN) 0.1 % nasal spray Place 1 spray into both nostrils 2 (two) times daily as needed for rhinitis.   Yes [provider]  budesonide-formoterol (SYMBICORT) 160-4.5 MCG/ACT inhaler Inhale 2 puffs into the lungs 2 (two) times daily.   Yes [provider]  carboxymethylcellul-glycerin (OPTIVE) 0.5-0.9 % ophthalmic solution Place 1 drop into both eyes 4 (four) times daily.   Yes [provider]  Cholecalciferol (VITAMIN D) 50 MCG (2000 UT) CAPS Take 1 capsule by mouth daily.   Yes [provider]  citalopram (CELEXA) 20 MG tablet Take 20 mg by mouth daily.   Yes [provider]  doxazosin (CARDURA) 4 MG tablet Take 1 tablet (4 mg total) by mouth at bedtime. 11/11/19  Yes Swayze, Ava, DO  gabapentin (NEURONTIN) 300 MG capsule Take 300 mg by mouth daily.    Yes [provider]  guaiFENesin (MUCINEX) 600 MG 12 hr tablet Take 1 tablet (600 mg total) by mouth 2 (two) times daily. 11/11/19  Yes Swayze, Ava, DO  insulin glargine (LANTUS) 100 UNIT/ML injection Inject 10 Units into the skin at bedtime.    Yes [provider]  insulin regular (NOVOLIN R) 100 units/mL injection Inject 8 Units into the skin 3 (three) times daily before meals.   Yes [provider]  ipratropium-albuterol (DUONEB) 0.5-2.5 (3) MG/3ML  SOLN Take 3 mLs by nebulization every 6 (six) hours as needed.   Yes [provider]  omeprazole (PRILOSEC) 20 MG capsule Take 20 mg by mouth daily.   Yes [provider]  tamsulosin (FLOMAX) 0.4 MG CAPS capsule Take 0.8 mg by mouth at bedtime.   Yes [provider]  traZODone (DESYREL) 50 MG tablet Take 50 mg by mouth at bedtime.   Yes [provider]  vitamin B-12 (CYANOCOBALAMIN) 1000 MCG tablet Take 1,000 mcg by mouth daily.   Yes [provider]  Vitamin D, Ergocalciferol, (DRISDOL) 1.25 MG (50000 UNIT) CAPS capsule Take 1 capsule (50,000 Units total) by mouth every 7 (seven) days.  11/11/19  Yes Swayze, Ava, DO    Family History Family History  Problem Relation Age of Onset  . Congestive Heart Failure Father   . Other Mother        unknown medical history    Social History Social History   Tobacco Use  . Smoking status: Current Every Day Smoker    Packs/day: 0.50    Years: 61.00    Pack years: 30.50    Types: Cigarettes  . Smokeless tobacco: Never Used  Vaping Use  . Vaping Use: Never used  Substance Use Topics  . Alcohol use: No  . Drug use: No     Allergies   Norco [hydrocodone-acetaminophen]   Review of Systems Review of Systems  Constitutional: Positive for activity change, appetite change and fatigue. Negative for chills, diaphoresis and fever.  HENT: Negative for congestion, ear discharge, ear pain, postnasal drip, rhinorrhea and sore throat.   Eyes: Negative for discharge.  Respiratory: Positive for cough and wheezing. Negative for chest tightness and shortness of breath.   Cardiovascular: Negative for chest pain.       Per his care given he always has a low BP  Gastrointestinal: Negative for diarrhea, nausea and vomiting.  Genitourinary: Negative for difficulty urinating.  Musculoskeletal: Negative for gait problem and myalgias.  Skin: Negative for rash.  Neurological: Negative for numbness.  Psychiatric/Behavioral: Negative for agitation and confusion.     Physical Exam Triage Vital Signs ED Triage Vitals  Enc Vitals Group     BP 01/13/20 1516 (!) 106/53     Pulse Rate 01/13/20 1516 60     Resp 01/13/20 1516 18     Temp 01/13/20 1516 98.9 F (37.2 C)     Temp Source 01/13/20 1516 Oral     SpO2 01/13/20 1516 100 %     Weight 01/13/20 1513 165 lb (74.8 kg)     Height 01/13/20 1513 5' 8.5" (1.74 m)     Head Circumference --      Peak Flow --      Pain Score 01/13/20 1513 0     Pain Loc --      Pain Edu? --      Excl. in [place]? --    No data found.  Updated Vital Signs BP (!) 106/53 (BP Location: Left Arm)   Pulse 60    Temp 98.9 F (37.2 C) (Oral)   Resp 18   Ht 5' 8.5" (1.74 m)   Wt 165 lb (74.8 kg)   SpO2 100%   BMI 24.72 kg/m   Visual Acuity Right Eye Distance:   Left Eye Distance:   Bilateral Distance:    Right Eye Near:   Left Eye Near:    Bilateral Near:     Physical Exam Vitals and nursing note reviewed.  Constitutional:      General: He is not in acute distress.    Appearance: He is normal weight. He is not ill-appearing or toxic-appearing.  HENT:     Head: Normocephalic.     Right Ear: Tympanic membrane, ear canal and external ear normal.     Left Ear: Tympanic membrane and ear canal normal.     Nose: Nose normal.     Mouth/Throat:     Mouth: Mucous membranes are moist.     Pharynx: Oropharynx is clear.  Eyes:     General: No scleral icterus.    Conjunctiva/sclera: Conjunctivae normal.  Cardiovascular:     Rate and Rhythm: Normal rate and regular rhythm.  Pulmonary:     Effort: Pulmonary effort is normal. No respiratory distress.     Breath sounds: Normal breath sounds. No wheezing or rales.  Musculoskeletal:     Cervical back: Neck supple.  Skin:    General: Skin is warm and dry.     Findings: No rash.  Neurological:     Mental Status: He is alert and oriented to person, place, and time.     Gait: Gait normal.  Psychiatric:        Mood and Affect: Mood normal.        Behavior: Behavior normal.        Thought Content: Thought content normal.        Judgment: Judgment normal.    UC Treatments / Results  Labs (all labs ordered are listed, but only abnormal results are displayed) Labs Reviewed  SARS CORONAVIRUS 2 (TAT 6-24 HRS)    EKG   Radiology No results found.  Procedures Procedures (including critical care time)  Medications Ordered in UC Medications - No data to display  Initial Impression / Assessment and Plan / UC Course  I have reviewed the triage vital signs and the nursing notes. I believe he is getting bronchitis and COPD exacerbation. I  placed him on Doxycyline. Encouraged pt to let his care giver to let him give him neb treatment when he sees he needs it.  Needs to come back if not improving in 48-72h  Final Clinical Impressions(s) / UC Diagnoses   Final diagnoses:  None   Discharge Instructions   None    ED Prescriptions    None     PDMP not reviewed this encounter.   Shelby Mattocks, [place] 01/13/20 1543

## 2020-01-13 NOTE — ED Triage Notes (Signed)
Patient states that he has been having cough, fatigue, productive cough x 8 days. States that his daughter has been sick recently but he doesn't think it was covid. Patient states that on 08/18 he had a defibrillator placed.

## 2020-01-13 NOTE — Discharge Instructions (Signed)
Come back if you  get worse in 48-72h

## 2020-01-14 LAB — SARS CORONAVIRUS 2 (TAT 6-24 HRS): SARS Coronavirus 2: NEGATIVE

## 2020-04-15 ENCOUNTER — Other Ambulatory Visit: Payer: Self-pay

## 2020-04-15 ENCOUNTER — Ambulatory Visit (INDEPENDENT_AMBULATORY_CARE_PROVIDER_SITE_OTHER): Payer: No Typology Code available for payment source

## 2020-04-15 ENCOUNTER — Ambulatory Visit (INDEPENDENT_AMBULATORY_CARE_PROVIDER_SITE_OTHER)
Admission: EM | Admit: 2020-04-15 | Discharge: 2020-04-15 | Disposition: A | Discharge status: Home / Self Care | Payer: No Typology Code available for payment source | Source: Home / Self Care | Attending: Sports Medicine | Admitting: Sports Medicine

## 2020-04-15 ENCOUNTER — Emergency Department: Payer: No Typology Code available for payment source

## 2020-04-15 DIAGNOSIS — N179 Acute kidney failure, unspecified: Secondary | ICD-10-CM | POA: Insufficient documentation

## 2020-04-15 DIAGNOSIS — R531 Weakness: Secondary | ICD-10-CM | POA: Diagnosis not present

## 2020-04-15 DIAGNOSIS — Z7951 Long term (current) use of inhaled steroids: Secondary | ICD-10-CM | POA: Insufficient documentation

## 2020-04-15 DIAGNOSIS — N183 Chronic kidney disease, stage 3 unspecified: Secondary | ICD-10-CM | POA: Insufficient documentation

## 2020-04-15 DIAGNOSIS — Z859 Personal history of malignant neoplasm, unspecified: Secondary | ICD-10-CM | POA: Diagnosis not present

## 2020-04-15 DIAGNOSIS — J441 Chronic obstructive pulmonary disease with (acute) exacerbation: Secondary | ICD-10-CM | POA: Diagnosis not present

## 2020-04-15 DIAGNOSIS — M791 Myalgia, unspecified site: Secondary | ICD-10-CM | POA: Diagnosis not present

## 2020-04-15 DIAGNOSIS — Z7982 Long term (current) use of aspirin: Secondary | ICD-10-CM | POA: Insufficient documentation

## 2020-04-15 DIAGNOSIS — Z79899 Other long term (current) drug therapy: Secondary | ICD-10-CM | POA: Insufficient documentation

## 2020-04-15 DIAGNOSIS — R778 Other specified abnormalities of plasma proteins: Secondary | ICD-10-CM | POA: Insufficient documentation

## 2020-04-15 DIAGNOSIS — J40 Bronchitis, not specified as acute or chronic: Secondary | ICD-10-CM | POA: Insufficient documentation

## 2020-04-15 DIAGNOSIS — R059 Cough, unspecified: Secondary | ICD-10-CM | POA: Insufficient documentation

## 2020-04-15 DIAGNOSIS — Z794 Long term (current) use of insulin: Secondary | ICD-10-CM | POA: Diagnosis not present

## 2020-04-15 DIAGNOSIS — F17210 Nicotine dependence, cigarettes, uncomplicated: Secondary | ICD-10-CM | POA: Diagnosis not present

## 2020-04-15 DIAGNOSIS — R0602 Shortness of breath: Secondary | ICD-10-CM | POA: Insufficient documentation

## 2020-04-15 DIAGNOSIS — E119 Type 2 diabetes mellitus without complications: Secondary | ICD-10-CM | POA: Insufficient documentation

## 2020-04-15 DIAGNOSIS — I129 Hypertensive chronic kidney disease with stage 1 through stage 4 chronic kidney disease, or unspecified chronic kidney disease: Secondary | ICD-10-CM | POA: Diagnosis not present

## 2020-04-15 DIAGNOSIS — Z20822 Contact with and (suspected) exposure to covid-19: Secondary | ICD-10-CM | POA: Diagnosis not present

## 2020-04-15 DIAGNOSIS — R062 Wheezing: Secondary | ICD-10-CM | POA: Diagnosis not present

## 2020-04-15 LAB — CBC
HCT: 37.6 % — ABNORMAL LOW (ref 39.0–52.0)
Hemoglobin: 12.6 g/dL — ABNORMAL LOW (ref 13.0–17.0)
MCH: 30.9 pg (ref 26.0–34.0)
MCHC: 33.5 g/dL (ref 30.0–36.0)
MCV: 92.2 fL (ref 80.0–100.0)
Platelets: 202 10*3/uL (ref 150–400)
RBC: 4.08 MIL/uL — ABNORMAL LOW (ref 4.22–5.81)
RDW: 13.5 % (ref 11.5–15.5)
WBC: 11.6 10*3/uL — ABNORMAL HIGH (ref 4.0–10.5)
nRBC: 0 % (ref 0.0–0.2)

## 2020-04-15 LAB — RESP PANEL BY RT-PCR (FLU A&B, COVID) ARPGX2
Influenza A by PCR: NEGATIVE
Influenza B by PCR: NEGATIVE
SARS Coronavirus 2 by RT PCR: NEGATIVE

## 2020-04-15 LAB — BASIC METABOLIC PANEL
Anion gap: 9 (ref 5–15)
BUN: 46 mg/dL — ABNORMAL HIGH (ref 8–23)
CO2: 24 mmol/L (ref 22–32)
Calcium: 8.8 mg/dL — ABNORMAL LOW (ref 8.9–10.3)
Chloride: 104 mmol/L (ref 98–111)
Creatinine, Ser: 2.74 mg/dL — ABNORMAL HIGH (ref 0.61–1.24)
GFR, Estimated: 23 mL/min — ABNORMAL LOW (ref >60)
Glucose, Bld: 327 mg/dL — ABNORMAL HIGH (ref 70–99)
Potassium: 5 mmol/L (ref 3.5–5.1)
Sodium: 137 mmol/L (ref 135–145)

## 2020-04-15 LAB — TROPONIN I (HIGH SENSITIVITY): Troponin I (High Sensitivity): 18 ng/L — ABNORMAL HIGH (ref <18)

## 2020-04-15 LAB — EXPECTORATED SPUTUM ASSESSMENT W GRAM STAIN, RFLX TO RESP C

## 2020-04-15 NOTE — ED Triage Notes (Signed)
Pt arrives [facility] from [place] w cc of CVOID symptoms. Pt states he's felt weakl and "worse than crap"  The past week. Pt reports shob, productive cough x 1 week. Per EMS pt tested neg Tuesday.

## 2020-04-15 NOTE — ED Provider Notes (Signed)
[facility]    CSN: [id number] Arrival date & time: 04/15/20  1421      History   Chief Complaint Chief Complaint  Patient presents with  . Cough    HPI Phillip Reyes is a 79 y.o. male.   Patient is a 79 yo male who presents with his caregiver for evaluation of cough, tachypnea, generalized weakness, fatigue, wheezing, and shortness of breath for 7-10 days.  The majority of his care is at the [place] and was seen 12/6 and prescribed prednisone which has not helped his symptoms.  He also received a covid test which was reportedly negative, but the caregiver does not have access to the report.  He also had a breathing treatment, but the improvement from this was not long lasting. He has a history of COPD, recent admission for pneumonia, and is a smoker.     Past Medical History:  Diagnosis Date  . Arthritis   . Cancer ([place])   . COPD (chronic obstructive pulmonary disease) ([place])   . Diabetes mellitus without complication ([place])   . Hypertension   . Renal disorder   . Stroke [facility])     Patient Active Problem List   Diagnosis Date Noted  . CAP (community acquired pneumonia) 01/27/2019  . Altered mental status 03/09/2016  . Acute encephalopathy 06/11/2015  . Type 2 diabetes mellitus ([place]) 06/11/2015  . HTN (hypertension) 06/11/2015  . COPD (chronic obstructive pulmonary disease) ([place]) 06/11/2015  . CKD (chronic kidney disease), stage III ([place]) 06/11/2015  . H/O: stroke 06/11/2015    Past Surgical History:  Procedure Laterality Date  . APPENDECTOMY    . THROAT SURGERY         Home Medications    Prior to Admission medications   Medication Sig Start Date End Date Taking? Authorizing Provider  acetaminophen (TYLENOL) 325 MG tablet Take 650 mg by mouth every 6 (six) hours as needed.   Yes [provider]  amLODipine (NORVASC) 10 MG tablet Take 1 tablet (10 mg total) by mouth daily. 11/11/19  Yes Swayze, Ava, DO  aspirin EC 81 MG tablet Take 81 mg by  mouth daily.   Yes [provider]  atorvastatin (LIPITOR) 80 MG tablet Take 80 mg by mouth at bedtime.   Yes [provider]  azelastine (ASTELIN) 0.1 % nasal spray Place 1 spray into both nostrils 2 (two) times daily as needed for rhinitis.   Yes [provider]  budesonide-formoterol (SYMBICORT) 160-4.5 MCG/ACT inhaler Inhale 2 puffs into the lungs 2 (two) times daily.   Yes [provider]  carboxymethylcellul-glycerin (OPTIVE) 0.5-0.9 % ophthalmic solution Place 1 drop into both eyes 4 (four) times daily.   Yes [provider]  Cholecalciferol (VITAMIN D) 50 MCG (2000 UT) CAPS Take 1 capsule by mouth daily.   Yes [provider]  citalopram (CELEXA) 20 MG tablet Take 20 mg by mouth daily.   Yes [provider]  doxazosin (CARDURA) 4 MG tablet Take 1 tablet (4 mg total) by mouth at bedtime. 11/11/19  Yes Swayze, Ava, DO  doxycycline (VIBRAMYCIN) 100 MG capsule Take 1 capsule (100 mg total) by mouth 2 (two) times daily. 01/13/20  Yes Rodriguez-Southworth, Sunday Spillers, PA-C  gabapentin (NEURONTIN) 300 MG capsule Take 300 mg by mouth daily.    Yes [provider]  guaiFENesin (MUCINEX) 600 MG 12 hr tablet Take 1 tablet (600 mg total) by mouth 2 (two) times daily. 11/11/19  Yes Swayze, Ava, DO  insulin glargine (LANTUS)  100 UNIT/ML injection Inject 10 Units into the skin at bedtime.    Yes [provider]  insulin regular (NOVOLIN R) 100 units/mL injection Inject 8 Units into the skin 3 (three) times daily before meals.   Yes [provider]  ipratropium-albuterol (DUONEB) 0.5-2.5 (3) MG/3ML SOLN Take 3 mLs by nebulization every 6 (six) hours as needed.   Yes [provider]  omeprazole (PRILOSEC) 20 MG capsule Take 20 mg by mouth daily.   Yes [provider]  tamsulosin (FLOMAX) 0.4 MG CAPS capsule Take 0.8 mg by mouth at bedtime.   Yes [provider]  traZODone (DESYREL) 50 MG tablet Take 50  mg by mouth at bedtime.   Yes [provider]  vitamin B-12 (CYANOCOBALAMIN) 1000 MCG tablet Take 1,000 mcg by mouth daily.   Yes [provider]  Vitamin D, Ergocalciferol, (DRISDOL) 1.25 MG (50000 UNIT) CAPS capsule Take 1 capsule (50,000 Units total) by mouth every 7 (seven) days. 11/11/19  Yes Swayze, Ava, DO    Family History Family History  Problem Relation Age of Onset  . Congestive Heart Failure Father   . Other Mother        unknown medical history    Social History Social History   Tobacco Use  . Smoking status: Current Every Day Smoker    Packs/day: 0.50    Years: 61.00    Pack years: 30.50    Types: Cigarettes  . Smokeless tobacco: Never Used  Vaping Use  . Vaping Use: Never used  Substance Use Topics  . Alcohol use: No  . Drug use: No     Allergies   Norco [hydrocodone-acetaminophen]   Review of Systems Review of Systems  Constitutional: Positive for activity change and fatigue.  HENT: Positive for congestion and rhinorrhea.   Respiratory: Positive for cough, shortness of breath and wheezing.   All other systems reviewed and are negative.    Physical Exam Triage Vital Signs ED Triage Vitals  Enc Vitals Group     BP 04/15/20 1559 (!) 137/56     Pulse Rate 04/15/20 1559 64     Resp 04/15/20 1559 (!) 32     Temp 04/15/20 1559 (!) 97.5 F (36.4 C)     Temp src --      SpO2 04/15/20 1559 98 %     Weight 04/15/20 1602 165 lb (74.8 kg)     Height 04/15/20 1602 5\' 8"  (1.727 m)     Head Circumference --      Peak Flow --      Pain Score 04/15/20 1601 7     Pain Loc --      Pain Edu? --      Excl. in [place]? --    No data found.  Updated Vital Signs BP (!) 137/56 (BP Location: Right Arm)   Pulse 64   Temp (!) 97.5 F (36.4 C)   Resp (!) 32   Ht 5\' 8"  (1.727 m)   Wt 74.8 kg   SpO2 98%   BMI 25.09 kg/m   Visual Acuity Right Eye Distance:   Left Eye Distance:   Bilateral Distance:    Right Eye Near:   Left Eye Near:     Bilateral Near:     Physical Exam Vitals and nursing note reviewed.  Constitutional:      Appearance: He is ill-appearing.  HENT:     Head: Normocephalic and atraumatic.  Cardiovascular:     Rate and Rhythm:  Normal rate and regular rhythm.     Pulses: Normal pulses.     Heart sounds: Normal heart sounds.  Pulmonary:     Effort: Respiratory distress present.     Breath sounds: Wheezing present.     Comments: Left sided crackles and wheezing.  Using accessory muscles of breathing Neurological:     Mental Status: He is alert.      UC Treatments / Results  Labs (all labs ordered are listed, but only abnormal results are displayed) Labs Reviewed  EXPECTORATED SPUTUM ASSESSMENT W REFEX TO RESP CULTURE    EKG   Radiology No results found.  Procedures Procedures (including critical care time)  Medications Ordered in UC Medications - No data to display  Initial Impression / Assessment and Plan / UC Course  I have reviewed the triage vital signs and the nursing notes.  Pertinent labs & imaging results that were available during my care of the patient were reviewed by me and considered in my medical decision making (see chart for details).   Re-evaluation after x-ray reveals no significant improvement with persistent tachypnea, left sided wheeze and crackles and an ill appearing elderly gentleman.  Will refer to [facility] for a higher level of care and possible admission given the duration of illness, physical exam findings, co-morbidities, and age. Final Clinical Impressions(s) / UC Diagnoses   Final diagnoses:  None   Discharge Instructions   None    ED Prescriptions    None     PDMP not reviewed this encounter.   Verda Cumins, MD 04/15/20 1742

## 2020-04-15 NOTE — ED Notes (Signed)
Patient is being discharged from the Urgent Care and sent to the Emergency Department via EMS . Per Dr. Drema Dallas, patient is in need of higher level of care due to Shortness of Breath. Patient is aware and verbalizes understanding of plan of care.  Vitals:   04/15/20 1559  BP: (!) 137/56  Pulse: 64  Resp: (!) 32  Temp: (!) 97.5 F (36.4 C)  SpO2: 98%

## 2020-04-15 NOTE — ED Triage Notes (Signed)
Patient states that he has been having a cough x 1 week. States that he had covid testing yesterday and was negative. Reports that he has been having wheezing and shortness of breath. Patient caregiver reports that he has been taking prednisone since 3 days ago.

## 2020-04-15 NOTE — ED Triage Notes (Signed)
Pt to ED via [facility] from [place] with c/o covid symptoms, weakness, cough, and SOB x 1 week. Per EMS pt tested negative on Tues. Per EMS pt went into UC today for SOB.   100% RA 164/70 61HR paced 97.5 oral   Wheezing lower lobes   CBG 408

## 2020-04-15 NOTE — ED Notes (Signed)
Called lab to add trop  

## 2020-04-16 ENCOUNTER — Emergency Department
Admission: EM | Admit: 2020-04-16 | Discharge: 2020-04-16 | Discharge status: Against Medical Advice | Payer: No Typology Code available for payment source | Attending: Emergency Medicine | Admitting: Emergency Medicine

## 2020-04-16 DIAGNOSIS — R778 Other specified abnormalities of plasma proteins: Secondary | ICD-10-CM

## 2020-04-16 DIAGNOSIS — N179 Acute kidney failure, unspecified: Secondary | ICD-10-CM

## 2020-04-16 DIAGNOSIS — J441 Chronic obstructive pulmonary disease with (acute) exacerbation: Secondary | ICD-10-CM | POA: Diagnosis present

## 2020-04-16 LAB — HEMOGLOBIN A1C
Hgb A1c MFr Bld: 7.9 % — ABNORMAL HIGH (ref 4.8–5.6)
Mean Plasma Glucose: 180.03 mg/dL

## 2020-04-16 LAB — TROPONIN I (HIGH SENSITIVITY): Troponin I (High Sensitivity): 15 ng/L (ref <18)

## 2020-04-16 LAB — MAGNESIUM: Magnesium: 2 mg/dL (ref 1.7–2.4)

## 2020-04-16 LAB — PROCALCITONIN: Procalcitonin: <0.1 ng/mL

## 2020-04-16 LAB — CBG MONITORING, ED: Glucose-Capillary: 266 mg/dL — ABNORMAL HIGH (ref 70–99)

## 2020-04-16 MED ORDER — IPRATROPIUM-ALBUTEROL 0.5-2.5 (3) MG/3ML IN SOLN
9.0000 mL | Freq: Once | RESPIRATORY_TRACT | Status: AC
  Administered 2020-04-16: 9 mL via RESPIRATORY_TRACT
  Filled 2020-04-16: qty 9

## 2020-04-16 MED ORDER — AMLODIPINE BESYLATE 5 MG PO TABS
10.0000 mg | ORAL_TABLET | Freq: Every day | ORAL | Status: DC
  Administered 2020-04-16: 10 mg via ORAL
  Filled 2020-04-16: qty 2

## 2020-04-16 MED ORDER — SODIUM CHLORIDE 0.9 % IV SOLN: 1.0000 g | Freq: Once | INTRAVENOUS | Status: DC

## 2020-04-16 MED ORDER — SODIUM CHLORIDE 0.9 % IV SOLN: 500.0000 mg | Freq: Once | INTRAVENOUS | Status: DC

## 2020-04-16 MED ORDER — INSULIN ASPART 100 UNIT/ML ~~LOC~~ SOLN
0.0000 [IU] | SUBCUTANEOUS | Status: DC
  Administered 2020-04-16: 8 [IU] via SUBCUTANEOUS
  Filled 2020-04-16: qty 1

## 2020-04-16 MED ORDER — LACTATED RINGERS IV BOLUS
1000.0000 mL | Freq: Once | INTRAVENOUS | Status: AC
  Administered 2020-04-16: 1000 mL via INTRAVENOUS

## 2020-04-16 NOTE — ED Notes (Signed)
Patient yelling, cursing.  Requesting to leave.  Dr. Tamala Julian at bedside to try to calm patient down and encourage him to stay.  Yelling persisting.  Patient continuing to yell and state he wishes to leave.  Patient encouraged to stay.

## 2020-04-16 NOTE — ED Notes (Signed)
Pt sittin in w/c in COVID section of lobby with no distress noted; no c/o voiced at present; pt updated on wait time and vs retaken

## 2020-04-16 NOTE — ED Provider Notes (Signed)
[facility] Emergency Department Provider Note  ____________________________________________   Event Date/Time   First MD Initiated Contact with Patient 04/16/20 [phone]     (approximate)  I have reviewed the triage vital signs and the nursing notes.   HISTORY  Chief Complaint Fatigue   HPI Phillip Reyes is a 79 y.o. male with past medical history of arthritis, COPD, HTN, DM, CVA, and CKD as well as ongoing tobacco abuse who presents for assessment of approximately 1 week of myalgias, weakness, shortness of breath with minimal exertion and productive cough that is increased from baseline.  Patient denies any fevers, headache, earache, sore throat, chest pain, abdominal pain, nausea, vomiting or diarrhea but does endorse a little bit of burning with urination of the last couple days.  He denies any rashes or acute extremity pain.  Denies regular EtOH use or illicit drug use.         Past Medical History:  Diagnosis Date  . Arthritis   . Cancer ([place])   . COPD (chronic obstructive pulmonary disease) ([place])   . Diabetes mellitus without complication ([place])   . Hypertension   . Renal disorder   . Stroke [facility])     Patient Active Problem List   Diagnosis Date Noted  . COPD with acute exacerbation ([place]) 04/16/2020  . CAP (community acquired pneumonia) 01/27/2019  . Altered mental status 03/09/2016  . Acute encephalopathy 06/11/2015  . Type 2 diabetes mellitus ([place]) 06/11/2015  . HTN (hypertension) 06/11/2015  . COPD (chronic obstructive pulmonary disease) ([place]) 06/11/2015  . CKD (chronic kidney disease), stage III ([place]) 06/11/2015  . H/O: stroke 06/11/2015    Past Surgical History:  Procedure Laterality Date  . APPENDECTOMY    . THROAT SURGERY      Prior to Admission medications   Medication Sig Start Date End Date Taking? Authorizing Provider  acetaminophen (TYLENOL) 325 MG tablet Take 650 mg by mouth every 6 (six) hours as needed.    [provider]  amLODipine (NORVASC) 10 MG tablet Take 1 tablet (10 mg total) by mouth daily. 11/11/19   Swayze, Ava, DO  aspirin EC 81 MG tablet Take 81 mg by mouth daily.    [provider]  atorvastatin (LIPITOR) 80 MG tablet Take 80 mg by mouth at bedtime.    [provider]  azelastine (ASTELIN) 0.1 % nasal spray Place 1 spray into both nostrils 2 (two) times daily as needed for rhinitis.    [provider]  budesonide-formoterol (SYMBICORT) 160-4.5 MCG/ACT inhaler Inhale 2 puffs into the lungs 2 (two) times daily.    [provider]  carboxymethylcellul-glycerin (OPTIVE) 0.5-0.9 % ophthalmic solution Place 1 drop into both eyes 4 (four) times daily.    [provider]  Cholecalciferol (VITAMIN D) 50 MCG (2000 UT) CAPS Take 1 capsule by mouth daily.    [provider]  citalopram (CELEXA) 20 MG tablet Take 20 mg by mouth daily.    [provider]  doxazosin (CARDURA) 4 MG tablet Take 1 tablet (4 mg total) by mouth at bedtime. 11/11/19   Swayze, Ava, DO  doxycycline (VIBRAMYCIN) 100 MG capsule Take 1 capsule (100 mg total) by mouth 2 (two) times daily. 01/13/20   Rodriguez-Southworth, Sunday Spillers, PA-C  gabapentin (NEURONTIN) 300 MG capsule Take 300 mg by mouth daily.     [provider]  guaiFENesin (MUCINEX) 600 MG 12 hr tablet Take 1 tablet (600 mg total) by mouth 2 (two) times daily. 11/11/19  Swayze, Ava, DO  insulin glargine (LANTUS) 100 UNIT/ML injection Inject 10 Units into the skin at bedtime.     [provider]  insulin regular (NOVOLIN R) 100 units/mL injection Inject 8 Units into the skin 3 (three) times daily before meals.    [provider]  ipratropium-albuterol (DUONEB) 0.5-2.5 (3) MG/3ML SOLN Take 3 mLs by nebulization every 6 (six) hours as needed.    [provider]  omeprazole (PRILOSEC) 20 MG capsule Take 20 mg by mouth daily.    [provider]  tamsulosin (FLOMAX) 0.4 MG CAPS  capsule Take 0.8 mg by mouth at bedtime.    [provider]  traZODone (DESYREL) 50 MG tablet Take 50 mg by mouth at bedtime.    [provider]  vitamin B-12 (CYANOCOBALAMIN) 1000 MCG tablet Take 1,000 mcg by mouth daily.    [provider]  Vitamin D, Ergocalciferol, (DRISDOL) 1.25 MG (50000 UNIT) CAPS capsule Take 1 capsule (50,000 Units total) by mouth every 7 (seven) days. 11/11/19   Swayze, Ava, DO    Allergies Norco [hydrocodone-acetaminophen]  Family History  Problem Relation Age of Onset  . Congestive Heart Failure Father   . Other Mother        unknown medical history    Social History Social History   Tobacco Use  . Smoking status: Current Every Day Smoker    Packs/day: 0.50    Years: 61.00    Pack years: 30.50    Types: Cigarettes  . Smokeless tobacco: Never Used  Vaping Use  . Vaping Use: Never used  Substance Use Topics  . Alcohol use: No  . Drug use: No    Review of Systems  Review of Systems  Constitutional: Positive for malaise/fatigue. Negative for chills and fever.  HENT: Negative for sore throat.   Eyes: Negative for pain.  Respiratory: Positive for cough, sputum production, shortness of breath and wheezing. Negative for stridor.   Cardiovascular: Negative for chest pain.  Gastrointestinal: Negative for vomiting.  Genitourinary: Positive for dysuria.  Musculoskeletal: Positive for myalgias.  Skin: Negative for rash.  Neurological: Negative for seizures, loss of consciousness and headaches.  Psychiatric/Behavioral: Negative for suicidal ideas.  All other systems reviewed and are negative.     ____________________________________________   PHYSICAL EXAM:  VITAL SIGNS: ED Triage Vitals  Enc Vitals Group     BP 04/15/20 1908 139/67     Pulse Rate 04/15/20 1908 (!) 59     Resp 04/15/20 1908 (!) 22     Temp 04/15/20 1908 98.8 F (37.1 C)     Temp Source 04/15/20 1908 Oral     SpO2 04/15/20 1908 99 %     Weight  04/15/20 1909 160 lb (72.6 kg)     Height 04/15/20 1909 5\' 8"  (1.727 m)     Head Circumference --      Peak Flow --      Pain Score 04/15/20 1908 7     Pain Loc --      Pain Edu? --      Excl. in [place]? --    Vitals:   04/16/20 0629 04/16/20 0819  BP: (!) 190/83 (!) 200/85  Pulse: (!) 59 60  Resp: 16   Temp: 97.6 F (36.4 C)   SpO2: 100% 100%   Physical Exam Vitals and nursing note reviewed.  Constitutional:      Appearance: He is well-developed and well-nourished.  HENT:     Head: Normocephalic and atraumatic.  Right Ear: External ear normal.     Left Ear: External ear normal.     Nose: Nose normal.     Mouth/Throat:     Mouth: Mucous membranes are dry.  Eyes:     Conjunctiva/sclera: Conjunctivae normal.  Cardiovascular:     Rate and Rhythm: Normal rate and regular rhythm.     Pulses: Normal pulses.     Heart sounds: No murmur heard.   Pulmonary:     Effort: Pulmonary effort is normal. No respiratory distress.     Breath sounds: Wheezing present.  Abdominal:     Palpations: Abdomen is soft.     Tenderness: There is no abdominal tenderness.  Musculoskeletal:        General: No edema.     Cervical back: Neck supple.     Right lower leg: No edema.     Left lower leg: No edema.  Skin:    General: Skin is warm and dry.     Capillary Refill: Capillary refill takes 2 to 3 seconds.  Neurological:     Mental Status: He is alert and oriented to person, place, and time.  Psychiatric:        Mood and Affect: Mood and affect and mood normal.      ____________________________________________   LABS (all labs ordered are listed, but only abnormal results are displayed)  Labs Reviewed  BASIC METABOLIC PANEL - Abnormal; Notable for the following components:      Result Value   Glucose, Bld 327 (*)    BUN 46 (*)    Creatinine, Ser 2.74 (*)    Calcium 8.8 (*)    GFR, Estimated 23 (*)    All other components within normal limits  CBC - Abnormal; Notable for the  following components:   WBC 11.6 (*)    RBC 4.08 (*)    Hemoglobin 12.6 (*)    HCT 37.6 (*)    All other components within normal limits  CBG MONITORING, ED - Abnormal; Notable for the following components:   Glucose-Capillary 266 (*)    All other components within normal limits  TROPONIN I (HIGH SENSITIVITY) - Abnormal; Notable for the following components:   Troponin I (High Sensitivity) 18 (*)    All other components within normal limits  RESP PANEL BY RT-PCR (FLU A&B, COVID) ARPGX2  MAGNESIUM  HEMOGLOBIN A1C  URINALYSIS, COMPLETE (UACMP) WITH MICROSCOPIC  PROCALCITONIN  TROPONIN I (HIGH SENSITIVITY)   ____________________________________________  EKG  Atrial paced rhythm with a ventricular rate of 61, left anterior fascicle block, and some nonspecific ST changes in anterior and lateral leads.  These changes are present on ECG obtained on 9/20. ____________________________________________  RADIOLOGY  ED MD interpretation: No focal consolidation, pneumothorax, overt edema, large effusion or other acute intrathoracic process  Official radiology report(s): DG Chest 2 View  Result Date: 04/15/2020 CLINICAL DATA:  79 year old male with cough common illness for 1 week. Smoker. EXAM: CHEST - 2 VIEW COMPARISON:  Chest radiographs earlier today, 11/08/2019 and earlier. FINDINGS: Left chest cardiac AICD. Mediastinal contours remain normal. Visualized tracheal air column is within normal limits. Lung volumes are within normal limits. Visualized tracheal air column is within normal limits. No pneumothorax, pulmonary edema, pleural effusion or confluent pulmonary opacity. Mild increased interstitial markings in both lungs do not appear significantly changed from 2020. No acute osseous abnormality identified. Negative visible bowel gas pattern. IMPRESSION: No acute cardiopulmonary abnormality. Electronically Signed   By: Genevie Ann M.D.   On:  04/15/2020 20:46   DG Chest 2 View  Result Date:  04/15/2020 CLINICAL DATA:  Cough, wheezing, and shortness of breath for the past week. EXAM: CHEST - 2 VIEW COMPARISON:  Chest x-ray dated November 08, 2019. FINDINGS: Interval left chest wall pacemaker placement. The heart size and mediastinal contours are within normal limits. Normal pulmonary vascularity. No focal consolidation, pleural effusion, or pneumothorax. No acute osseous abnormality. IMPRESSION: No active cardiopulmonary disease. Electronically Signed   By: Titus Dubin M.D.   On: 04/15/2020 17:06    ____________________________________________   PROCEDURES  Procedure(s) performed (including Critical Care):  Procedures   ____________________________________________   INITIAL IMPRESSION / ASSESSMENT AND PLAN / ED COURSE        Patient presents above-stated exam for assessment of multiple complaints as noted above including some myalgias, cough fatigue and some dysuria.  On arrival he is slightly tachypneic at 22 with otherwise stable vital signs on room air.  Is some minimal wheezing on exam otherwise is nonfocal and alert and oriented.  Regard to patient's cough myalgias and wheezing concern for possible COPD exacerbation.  Chest x-ray shows no evidence of pneumonia pneumothorax or volume overload.  Suspect patient's mild elevate troponin on arrival at 18 with downtrend at 15 secondary to some mild demand ischemia in the setting of high blood pressure and overall a low suspicion for acute coronary thrombosis.  No evidence of ischemia on EKG or arrhythmia.  Patient was treated with a DuoNeb and plan initially was to give patient a dose of Rocephin and azithromycin  BMP obtained remarkable for evidence of an AKI with creatinine 2.74 compared to 5 months ago with no significant electrolyte or metabolic derangements.  He is hyperglycemic at 327 with no evidence of acidosis.  Suspect his hyperglycemia secondary to recent prednisone prescription.  CBC shows mild leukocytosis with WBC  count of 11.6 and otherwise unremarkable.  Magnesium is unremarkable.  Plan is to obtain urine studies and admit to hospital service for further evaluation management of AKI.  However prior to being admitted patient started screaming at this examiner and staff members in the ED stating we are not doing anything for him.  He stated "I am going to punch you get me out here".  I was unable to calm the patient by providing blankets patient would not lower his voice or stop screaming at this examiner after multiple attempts to verbally de-escalate.  He stated "I I do not care if I die, I am leaving out".  Patient discharged AMA.    ____________________________________________   FINAL CLINICAL IMPRESSION(S) / ED DIAGNOSES  Final diagnoses:  COPD exacerbation ([place])  AKI (acute kidney injury) ([place])  Troponin I above reference range    Medications  insulin aspart (novoLOG) injection 0-15 Units (8 Units Subcutaneous Given 04/16/20 0827)  amLODipine (NORVASC) tablet 10 mg (10 mg Oral Given 04/16/20 0820)  cefTRIAXone (ROCEPHIN) 1 g in sodium chloride 0.9 % 100 mL IVPB (has no administration in time range)  azithromycin (ZITHROMAX) 500 mg in sodium chloride 0.9 % 250 mL IVPB (has no administration in time range)  lactated ringers bolus 1,000 mL (0 mLs Intravenous Stopped 04/16/20 0938)  ipratropium-albuterol (DUONEB) 0.5-2.5 (3) MG/3ML nebulizer solution 9 mL (9 mLs Nebulization Given 04/16/20 0830)     ED Discharge Orders    None       Note:  This document was prepared using Dragon voice recognition software and may include unintentional dictation errors.   Lucrezia Starch, MD 04/16/20  1010  

## 2020-04-16 NOTE — ED Provider Notes (Signed)
Spoke with patient because he was screaming and threatening staff.  Informed him that we understand he is frustrated but that he cannot treat our staff like that.  He informed me using multiple curse words that he is going to leave [place].  He does have decisional capacity.   Lavonia Drafts, MD 04/16/20 [phone]

## 2020-04-18 LAB — CULTURE, RESPIRATORY W GRAM STAIN
Culture: NORMAL
Gram Stain: NONE SEEN

## 2021-06-22 ENCOUNTER — Inpatient Hospital Stay: Payer: No Typology Code available for payment source

## 2021-06-22 ENCOUNTER — Other Ambulatory Visit: Payer: Self-pay

## 2021-06-22 ENCOUNTER — Inpatient Hospital Stay
Admission: EM | Admit: 2021-06-22 | Discharge: 2021-06-29 | DRG: 208 | Disposition: A | Discharge status: Home Health | Payer: No Typology Code available for payment source | Attending: Internal Medicine | Admitting: Internal Medicine

## 2021-06-22 ENCOUNTER — Encounter: Payer: Self-pay | Admitting: *Deleted

## 2021-06-22 ENCOUNTER — Emergency Department: Payer: No Typology Code available for payment source

## 2021-06-22 DIAGNOSIS — I10 Essential (primary) hypertension: Secondary | ICD-10-CM | POA: Diagnosis not present

## 2021-06-22 DIAGNOSIS — E1122 Type 2 diabetes mellitus with diabetic chronic kidney disease: Secondary | ICD-10-CM | POA: Diagnosis present

## 2021-06-22 DIAGNOSIS — M1990 Unspecified osteoarthritis, unspecified site: Secondary | ICD-10-CM | POA: Diagnosis present

## 2021-06-22 DIAGNOSIS — Z7982 Long term (current) use of aspirin: Secondary | ICD-10-CM | POA: Diagnosis not present

## 2021-06-22 DIAGNOSIS — D631 Anemia in chronic kidney disease: Secondary | ICD-10-CM | POA: Diagnosis present

## 2021-06-22 DIAGNOSIS — Z8673 Personal history of transient ischemic attack (TIA), and cerebral infarction without residual deficits: Secondary | ICD-10-CM | POA: Diagnosis not present

## 2021-06-22 DIAGNOSIS — N1832 Chronic kidney disease, stage 3b: Secondary | ICD-10-CM | POA: Diagnosis present

## 2021-06-22 DIAGNOSIS — R4701 Aphasia: Secondary | ICD-10-CM | POA: Diagnosis present

## 2021-06-22 DIAGNOSIS — N179 Acute kidney failure, unspecified: Secondary | ICD-10-CM | POA: Diagnosis present

## 2021-06-22 DIAGNOSIS — E440 Moderate protein-calorie malnutrition: Secondary | ICD-10-CM | POA: Diagnosis present

## 2021-06-22 DIAGNOSIS — N400 Enlarged prostate without lower urinary tract symptoms: Secondary | ICD-10-CM | POA: Diagnosis present

## 2021-06-22 DIAGNOSIS — Z794 Long term (current) use of insulin: Secondary | ICD-10-CM | POA: Diagnosis not present

## 2021-06-22 DIAGNOSIS — Z79899 Other long term (current) drug therapy: Secondary | ICD-10-CM

## 2021-06-22 DIAGNOSIS — Z885 Allergy status to narcotic agent status: Secondary | ICD-10-CM | POA: Diagnosis not present

## 2021-06-22 DIAGNOSIS — Z6823 Body mass index (BMI) 23.0-23.9, adult: Secondary | ICD-10-CM

## 2021-06-22 DIAGNOSIS — E119 Type 2 diabetes mellitus without complications: Secondary | ICD-10-CM

## 2021-06-22 DIAGNOSIS — J9601 Acute respiratory failure with hypoxia: Secondary | ICD-10-CM | POA: Diagnosis present

## 2021-06-22 DIAGNOSIS — Z7952 Long term (current) use of systemic steroids: Secondary | ICD-10-CM

## 2021-06-22 DIAGNOSIS — Z20822 Contact with and (suspected) exposure to covid-19: Secondary | ICD-10-CM | POA: Diagnosis present

## 2021-06-22 DIAGNOSIS — J9602 Acute respiratory failure with hypercapnia: Secondary | ICD-10-CM | POA: Diagnosis not present

## 2021-06-22 DIAGNOSIS — J449 Chronic obstructive pulmonary disease, unspecified: Secondary | ICD-10-CM | POA: Diagnosis present

## 2021-06-22 DIAGNOSIS — D72821 Monocytosis (symptomatic): Secondary | ICD-10-CM | POA: Diagnosis present

## 2021-06-22 DIAGNOSIS — R401 Stupor: Secondary | ICD-10-CM | POA: Diagnosis not present

## 2021-06-22 DIAGNOSIS — E1151 Type 2 diabetes mellitus with diabetic peripheral angiopathy without gangrene: Secondary | ICD-10-CM | POA: Diagnosis present

## 2021-06-22 DIAGNOSIS — G9341 Metabolic encephalopathy: Secondary | ICD-10-CM | POA: Diagnosis present

## 2021-06-22 DIAGNOSIS — J441 Chronic obstructive pulmonary disease with (acute) exacerbation: Secondary | ICD-10-CM | POA: Diagnosis not present

## 2021-06-22 DIAGNOSIS — G9340 Encephalopathy, unspecified: Secondary | ICD-10-CM | POA: Diagnosis present

## 2021-06-22 DIAGNOSIS — I5022 Chronic systolic (congestive) heart failure: Secondary | ICD-10-CM | POA: Diagnosis present

## 2021-06-22 DIAGNOSIS — B952 Enterococcus as the cause of diseases classified elsewhere: Secondary | ICD-10-CM | POA: Diagnosis present

## 2021-06-22 DIAGNOSIS — Z79891 Long term (current) use of opiate analgesic: Secondary | ICD-10-CM

## 2021-06-22 DIAGNOSIS — F17210 Nicotine dependence, cigarettes, uncomplicated: Secondary | ICD-10-CM | POA: Diagnosis present

## 2021-06-22 DIAGNOSIS — I130 Hypertensive heart and chronic kidney disease with heart failure and stage 1 through stage 4 chronic kidney disease, or unspecified chronic kidney disease: Secondary | ICD-10-CM | POA: Diagnosis present

## 2021-06-22 DIAGNOSIS — J9600 Acute respiratory failure, unspecified whether with hypoxia or hypercapnia: Secondary | ICD-10-CM | POA: Diagnosis present

## 2021-06-22 DIAGNOSIS — J690 Pneumonitis due to inhalation of food and vomit: Principal | ICD-10-CM | POA: Diagnosis present

## 2021-06-22 DIAGNOSIS — Z8546 Personal history of malignant neoplasm of prostate: Secondary | ICD-10-CM

## 2021-06-22 DIAGNOSIS — Z8249 Family history of ischemic heart disease and other diseases of the circulatory system: Secondary | ICD-10-CM | POA: Diagnosis not present

## 2021-06-22 DIAGNOSIS — Z9289 Personal history of other medical treatment: Secondary | ICD-10-CM

## 2021-06-22 DIAGNOSIS — R609 Edema, unspecified: Secondary | ICD-10-CM

## 2021-06-22 DIAGNOSIS — N390 Urinary tract infection, site not specified: Secondary | ICD-10-CM | POA: Diagnosis present

## 2021-06-22 DIAGNOSIS — N183 Chronic kidney disease, stage 3 unspecified: Secondary | ICD-10-CM | POA: Diagnosis present

## 2021-06-22 LAB — RESP PANEL BY RT-PCR (FLU A&B, COVID) ARPGX2
Influenza A by PCR: NEGATIVE
Influenza B by PCR: NEGATIVE
SARS Coronavirus 2 by RT PCR: NEGATIVE

## 2021-06-22 LAB — BLOOD GAS, VENOUS
Acid-Base Excess: 2.4 mmol/L — ABNORMAL HIGH (ref 0.0–2.0)
Bicarbonate: 28.3 mmol/L — ABNORMAL HIGH (ref 20.0–28.0)
FIO2: 100 %
O2 Saturation: 77.6 %
Patient temperature: 37
pCO2, Ven: 49 mmHg (ref 44–60)
pH, Ven: 7.37 (ref 7.25–7.43)
pO2, Ven: 45 mmHg (ref 32–45)

## 2021-06-22 LAB — URINE DRUG SCREEN, QUALITATIVE (ARMC ONLY)
Amphetamines, Ur Screen: NOT DETECTED
Barbiturates, Ur Screen: NOT DETECTED
Benzodiazepine, Ur Scrn: NOT DETECTED
Cannabinoid 50 Ng, Ur ~~LOC~~: NOT DETECTED
Cocaine Metabolite,Ur ~~LOC~~: NOT DETECTED
MDMA (Ecstasy)Ur Screen: NOT DETECTED
Methadone Scn, Ur: NOT DETECTED
Opiate, Ur Screen: NOT DETECTED
Phencyclidine (PCP) Ur S: NOT DETECTED
Tricyclic, Ur Screen: NOT DETECTED

## 2021-06-22 LAB — CBC WITH DIFFERENTIAL/PLATELET
Abs Immature Granulocytes: 0.08 10*3/uL — ABNORMAL HIGH (ref 0.00–0.07)
Basophils Absolute: 0.1 10*3/uL (ref 0.0–0.1)
Basophils Relative: 1 %
Eosinophils Absolute: 0.3 10*3/uL (ref 0.0–0.5)
Eosinophils Relative: 2 %
HCT: 30.8 % — ABNORMAL LOW (ref 39.0–52.0)
Hemoglobin: 9.8 g/dL — ABNORMAL LOW (ref 13.0–17.0)
Immature Granulocytes: 1 %
Lymphocytes Relative: 15 %
Lymphs Abs: 1.5 10*3/uL (ref 0.7–4.0)
MCH: 30.9 pg (ref 26.0–34.0)
MCHC: 31.8 g/dL (ref 30.0–36.0)
MCV: 97.2 fL (ref 80.0–100.0)
Monocytes Absolute: 1.1 10*3/uL — ABNORMAL HIGH (ref 0.1–1.0)
Monocytes Relative: 11 %
Neutro Abs: 7.4 10*3/uL (ref 1.7–7.7)
Neutrophils Relative %: 70 %
Platelets: 192 10*3/uL (ref 150–400)
RBC: 3.17 MIL/uL — ABNORMAL LOW (ref 4.22–5.81)
RDW: 14.1 % (ref 11.5–15.5)
WBC: 10.5 10*3/uL (ref 4.0–10.5)
nRBC: 0 % (ref 0.0–0.2)

## 2021-06-22 LAB — BLOOD GAS, ARTERIAL
Acid-Base Excess: 3.4 mmol/L — ABNORMAL HIGH (ref 0.0–2.0)
Bicarbonate: 26.5 mmol/L (ref 20.0–28.0)
FIO2: 40 %
MECHVT: 500 mL
O2 Content: 40 L/min
O2 Saturation: 99 %
PEEP: 5 cmH2O
Patient temperature: 37
RATE: 16 {breaths}/min
pCO2 arterial: 34 mmHg (ref 32–48)
pH, Arterial: 7.5 — ABNORMAL HIGH (ref 7.35–7.45)
pO2, Arterial: 78 mmHg — ABNORMAL LOW (ref 83–108)

## 2021-06-22 LAB — COMPREHENSIVE METABOLIC PANEL
ALT: 18 U/L (ref 0–44)
AST: 13 U/L — ABNORMAL LOW (ref 15–41)
Albumin: 3.3 g/dL — ABNORMAL LOW (ref 3.5–5.0)
Alkaline Phosphatase: 74 U/L (ref 38–126)
Anion gap: 9 (ref 5–15)
BUN: 63 mg/dL — ABNORMAL HIGH (ref 8–23)
CO2: 26 mmol/L (ref 22–32)
Calcium: 7.6 mg/dL — ABNORMAL LOW (ref 8.9–10.3)
Chloride: 101 mmol/L (ref 98–111)
Creatinine, Ser: 4.1 mg/dL — ABNORMAL HIGH (ref 0.61–1.24)
GFR, Estimated: 14 mL/min — ABNORMAL LOW (ref >60)
Glucose, Bld: 295 mg/dL — ABNORMAL HIGH (ref 70–99)
Potassium: 4.6 mmol/L (ref 3.5–5.1)
Sodium: 136 mmol/L (ref 135–145)
Total Bilirubin: 0.7 mg/dL (ref 0.3–1.2)
Total Protein: 5.8 g/dL — ABNORMAL LOW (ref 6.5–8.1)

## 2021-06-22 LAB — CBG MONITORING, ED
Glucose-Capillary: 305 mg/dL — ABNORMAL HIGH (ref 70–99)
Glucose-Capillary: 296 mg/dL — ABNORMAL HIGH (ref 70–99)

## 2021-06-22 LAB — TROPONIN I (HIGH SENSITIVITY)
Troponin I (High Sensitivity): 10 ng/L (ref <18)
Troponin I (High Sensitivity): 10 ng/L

## 2021-06-22 LAB — LACTIC ACID, PLASMA
Lactic Acid, Venous: 1.9 mmol/L (ref 0.5–1.9)
Lactic Acid, Venous: 1.9 mmol/L (ref 0.5–1.9)

## 2021-06-22 LAB — D-DIMER, QUANTITATIVE: D-Dimer, Quant: 1.03 ug/mL-FEU — ABNORMAL HIGH (ref 0.00–0.50)

## 2021-06-22 LAB — HEMOGLOBIN A1C
Hgb A1c MFr Bld: 6.7 % — ABNORMAL HIGH (ref 4.8–5.6)
Mean Plasma Glucose: 145.59 mg/dL

## 2021-06-22 LAB — MRSA NEXT GEN BY PCR, NASAL: MRSA by PCR Next Gen: NOT DETECTED

## 2021-06-22 LAB — GLUCOSE, CAPILLARY
Glucose-Capillary: 217 mg/dL — ABNORMAL HIGH (ref 70–99)
Glucose-Capillary: 241 mg/dL — ABNORMAL HIGH (ref 70–99)

## 2021-06-22 LAB — BRAIN NATRIURETIC PEPTIDE: B Natriuretic Peptide: 152.1 pg/mL — ABNORMAL HIGH (ref 0.0–100.0)

## 2021-06-22 LAB — ETHANOL: Alcohol, Ethyl (B): <10 mg/dL

## 2021-06-22 LAB — SALICYLATE LEVEL: Salicylate Lvl: <7 mg/dL — ABNORMAL LOW (ref 7.0–30.0)

## 2021-06-22 MED ORDER — ROCURONIUM BROMIDE 50 MG/5ML IV SOLN
70.0000 mg | Freq: Once | INTRAVENOUS | Status: AC
Start: 2021-06-22 — End: 2021-06-22
  Filled 2021-06-22: qty 7

## 2021-06-22 MED ORDER — ORAL CARE MOUTH RINSE: 15.0000 mL | OROMUCOSAL | Status: DC

## 2021-06-22 MED ORDER — IPRATROPIUM-ALBUTEROL 0.5-2.5 (3) MG/3ML IN SOLN: 3.0000 mL | Freq: Four times a day (QID) | RESPIRATORY_TRACT | Status: DC | PRN

## 2021-06-22 MED ORDER — ORAL CARE MOUTH RINSE
15.0000 mL | OROMUCOSAL | Status: DC
  Administered 2021-06-22 – 2021-06-24 (×17): 15 mL via OROMUCOSAL
  Filled 2021-06-22 (×6): qty 15

## 2021-06-22 MED ORDER — NOREPINEPHRINE 4 MG/250ML-% IV SOLN
INTRAVENOUS | Status: AC
  Administered 2021-06-22: 3 ug/min via INTRAVENOUS
  Filled 2021-06-22: qty 250

## 2021-06-22 MED ORDER — FAMOTIDINE IN NACL 20-0.9 MG/50ML-% IV SOLN
20.0000 mg | INTRAVENOUS | Status: DC
  Administered 2021-06-22: 20 mg via INTRAVENOUS
  Filled 2021-06-22: qty 50

## 2021-06-22 MED ORDER — CHLORHEXIDINE GLUCONATE CLOTH 2 % EX PADS
6.0000 | MEDICATED_PAD | Freq: Every day | CUTANEOUS | Status: DC
  Administered 2021-06-22: 6 via TOPICAL
  Filled 2021-06-22: qty 6

## 2021-06-22 MED ORDER — FENTANYL CITRATE PF 50 MCG/ML IJ SOSY
50.0000 ug | PREFILLED_SYRINGE | INTRAMUSCULAR | Status: AC | PRN
  Administered 2021-06-22 – 2021-06-23 (×3): 50 ug via INTRAVENOUS
  Filled 2021-06-22 (×4): qty 1

## 2021-06-22 MED ORDER — NOREPINEPHRINE 4 MG/250ML-% IV SOLN: 0.0000 ug/min | INTRAVENOUS | Status: DC

## 2021-06-22 MED ORDER — HEPARIN SODIUM (PORCINE) 5000 UNIT/ML IJ SOLN
5000.0000 [IU] | Freq: Three times a day (TID) | INTRAMUSCULAR | Status: DC
  Administered 2021-06-22 – 2021-06-29 (×21): 5000 [IU] via SUBCUTANEOUS
  Filled 2021-06-22 (×20): qty 1

## 2021-06-22 MED ORDER — DOCUSATE SODIUM 50 MG/5ML PO LIQD
100.0000 mg | Freq: Two times a day (BID) | ORAL | Status: DC | PRN
  Filled 2021-06-22: qty 10

## 2021-06-22 MED ORDER — FENTANYL CITRATE PF 50 MCG/ML IJ SOSY
50.0000 ug | PREFILLED_SYRINGE | INTRAMUSCULAR | Status: DC | PRN
  Administered 2021-06-23 (×4): 50 ug via INTRAVENOUS
  Filled 2021-06-22 (×6): qty 1

## 2021-06-22 MED ORDER — CHLORHEXIDINE GLUCONATE 0.12% ORAL RINSE (MEDLINE KIT)
15.0000 mL | Freq: Two times a day (BID) | OROMUCOSAL | Status: DC
  Administered 2021-06-22 – 2021-06-24 (×4): 15 mL via OROMUCOSAL
  Filled 2021-06-22 (×2): qty 15

## 2021-06-22 MED ORDER — ATORVASTATIN CALCIUM 20 MG PO TABS
80.0000 mg | ORAL_TABLET | Freq: Every day | ORAL | Status: DC
Start: 2021-06-22 — End: 2021-06-29
  Administered 2021-06-22 – 2021-06-28 (×6): 80 mg
  Filled 2021-06-22 (×6): qty 4

## 2021-06-22 MED ORDER — INSULIN ASPART 100 UNIT/ML IJ SOLN
0.0000 [IU] | INTRAMUSCULAR | Status: DC
  Administered 2021-06-22: 5 [IU] via SUBCUTANEOUS
  Administered 2021-06-22 (×2): 3 [IU] via SUBCUTANEOUS
  Administered 2021-06-23: 2 [IU] via SUBCUTANEOUS
  Administered 2021-06-23 (×2): 3 [IU] via SUBCUTANEOUS
  Administered 2021-06-23: 2 [IU] via SUBCUTANEOUS
  Administered 2021-06-23: 3 [IU] via SUBCUTANEOUS
  Administered 2021-06-23: 2 [IU] via SUBCUTANEOUS
  Administered 2021-06-24 (×2): 3 [IU] via SUBCUTANEOUS
  Administered 2021-06-24 (×2): 2 [IU] via SUBCUTANEOUS
  Filled 2021-06-22 (×14): qty 1

## 2021-06-22 MED ORDER — ROCURONIUM BROMIDE 10 MG/ML (PF) SYRINGE
PREFILLED_SYRINGE | INTRAVENOUS | Status: AC
  Administered 2021-06-22: 70 mg via INTRAVENOUS
  Filled 2021-06-22: qty 10

## 2021-06-22 MED ORDER — CHLORHEXIDINE GLUCONATE 0.12% ORAL RINSE (MEDLINE KIT): 15.0000 mL | Freq: Two times a day (BID) | OROMUCOSAL | Status: DC

## 2021-06-22 MED ORDER — IPRATROPIUM-ALBUTEROL 0.5-2.5 (3) MG/3ML IN SOLN
3.0000 mL | Freq: Once | RESPIRATORY_TRACT | Status: AC
  Administered 2021-06-22: 3 mL via RESPIRATORY_TRACT
  Filled 2021-06-22: qty 3

## 2021-06-22 MED ORDER — POLYETHYLENE GLYCOL 3350 17 G PO PACK: 17.0000 g | PACK | Freq: Every day | ORAL | Status: DC | PRN

## 2021-06-22 MED ORDER — PROPOFOL 1000 MG/100ML IV EMUL
0.0000 ug/kg/min | INTRAVENOUS | Status: DC
  Administered 2021-06-22: 10 ug/kg/min via INTRAVENOUS
  Administered 2021-06-22: 30 ug/kg/min via INTRAVENOUS
  Administered 2021-06-23: 25 ug/kg/min via INTRAVENOUS
  Administered 2021-06-23 – 2021-06-24 (×2): 35 ug/kg/min via INTRAVENOUS
  Filled 2021-06-22 (×6): qty 100

## 2021-06-22 MED ORDER — SODIUM CHLORIDE 0.9 % IV SOLN
250.0000 mL | INTRAVENOUS | Status: DC
  Administered 2021-06-26: 250 mL via INTRAVENOUS

## 2021-06-22 MED ORDER — ETOMIDATE 2 MG/ML IV SOLN
20.0000 mg | Freq: Once | INTRAVENOUS | Status: AC
  Administered 2021-06-22: 20 mg via INTRAVENOUS
  Filled 2021-06-22: qty 10

## 2021-06-22 MED ORDER — NOREPINEPHRINE 4 MG/250ML-% IV SOLN
2.0000 ug/min | INTRAVENOUS | Status: DC
  Administered 2021-06-23: 2 ug/min via INTRAVENOUS
  Filled 2021-06-22: qty 250

## 2021-06-22 MED ORDER — DOCUSATE SODIUM 100 MG PO CAPS: 100.0000 mg | ORAL_CAPSULE | Freq: Two times a day (BID) | ORAL | Status: DC | PRN

## 2021-06-22 NOTE — ED Notes (Addendum)
Cbg 305, preparing to intubate, obtunded.

## 2021-06-22 NOTE — ED Notes (Addendum)
PCCM at [facility]. HOB 30 degrees. Decreased Fio2 to 80%. RT at [facility].

## 2021-06-22 NOTE — ED Notes (Signed)
RT at [facility], to CT, then to ICU.

## 2021-06-22 NOTE — ED Triage Notes (Signed)
BIB  [facility] EMS from home for sob, left AMA yesterday from hospital, admitted for fluid overload, sudden onset 12 hr ago, 77% RA for FD, NRB didn't help, placed on CPAP by EMS up to 99%. RT amnd EDP present on arrival. Here from home. Full code. Albuterol given at home, solumedrol and duoneb given by EMS. CO2 34. H/o COPD, CHF, and pacemaker.

## 2021-06-22 NOTE — Progress Notes (Signed)
Consulted for [place] PIV,per r bedside RN, Magda Paganini, Levophed not needed at this time. Advised to reconsult IV team.

## 2021-06-22 NOTE — H&P (Signed)
NAME:  Phillip Reyes, MRN:  [id number], DOB:  08/29/1940, LOS: 0 ADMISSION DATE:  06/22/2021, CONSULTATION DATE: 06/22/2021 REFERRING MD: Dr. Ellender Hose, CHIEF COMPLAINT: Shortness of Breath   History of Present Illness:  This is an 81 yo male who presented to [facility] ER on 02/15 with shortness of breath.  Per ER notes EMS reported the pt was recently hospitalized at the [facility] for 1 month with acute respiratory failure in the setting of CHF requiring aggressive diuresis, however he signed out AMA on 02/14.  Upon EMS arrival at pts home he was hypoxic in acute respiratory distress.  He was placed on Bipap with initial improvement, however en route to the ER pt became increasingly drowsy.    ED course Upon arrival to the ER pt difficult to arouse with low tidal volumes on Bipap.  Therefore, pt required emergent mechanical intubation.  CXR negative for acute abnormality.  Lab results revealed glucose 295, BUN 63, creatinine 4.10, albumin 3.3, hgb 9.8, and COVID-19/Influenza PCR A&B negative.    Pertinent  Medical History  Arthritis COPD Type II Diabetes Mellitus  HTN Stroke  PVD Cancer  Renal Disorder   Significant Hospital Events: Including procedures, antibiotic start and stop dates in addition to other pertinent events   02/15: Pt admitted to ICU with acute on chronic respiratory failure and acute encephalopathy requiring mechanical intubation   Interim History / Subjective:  Pt mechanically ventilated and sedated at this time   Objective   Blood pressure (!) 126/55, pulse (!) 59, resp. rate 20, weight 72.6 kg, SpO2 100 %.    FiO2 (%):  [100 %] 100 %  No intake or output data in the 24 hours ending 06/22/21 1546 Filed Weights   06/22/21 1446  Weight: 72.6 kg    REVIEW OF SYSTEMS  PATIENT IS UNABLE TO PROVIDE COMPLETE REVIEW OF SYSTEMS DUE TO SEVERE CRITICAL ILLNESS AND TOXIC METABOLIC ENCEPHALOPATHY    PHYSICAL EXAMINATION:  GENERAL:critically ill appearing, +resp distress EYES:  Pupils equal, round, reactive to light.  No scleral icterus.  MOUTH: Moist mucosal membrane. INTUBATED NECK: Supple.  PULMONARY: +rhonchi, +wheezing CARDIOVASCULAR: S1 and S2.  No murmurs  GASTROINTESTINAL: Soft, nontender, -distended. Positive bowel sounds.  MUSCULOSKELETAL: No swelling, clubbing, or edema.  NEUROLOGIC: obtunded SKIN:intact,warm,dry   Resolved Hospital Problem list    Assessment & Plan:   Acute on chronic respiratory failure etiology unclear at this time differentials include: possible PE vs. CVA vs. Seizure activity Mechanical ventilation  Hx: COPD  - Full vent support for now: vent settings reviewed and established - SBT once all parameters met  - VAP bundle implemented  - PAD protocol: prn propofol gtt and intermittent fentanyl to maintain RASS goal 0 to -1 - Prn bronchodilator therapy   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) - Trend BMP  - Replace electrolytes as indicated  - Monitor UOP - Will consider nephrology consult in the am if renal function worsens   Type II diabetes mellitus  - CBG's q4hrs  - Hemoglobin A1c pending  - SSI   Acute encephalopathy etiology unclear concerning for possible CVA       CT Head 02/15 negative for acute intracranial pathology concerning for age-related atrophy and chronic microvascular ischemic changes  - Urine drug screen, ethanol level, and salicylate level pending  - If mentation does not improve will place order for MRI Brain and consult neurology   Best Practice (right click and "Reselect all SmartList  Selections" daily)   Diet/type: NPO DVT prophylaxis: prophylactic heparin  GI prophylaxis: H2B Lines: N/A Foley:  Yes, and it is still needed Code Status:  full code   Labs   CBC: Recent Labs  Lab 06/22/21 1442  WBC 10.5  NEUTROABS 7.4  HGB 9.8*  HCT 30.8*  MCV 97.2  PLT 338    Basic Metabolic Panel: No results for input(s): NA, K, CL, CO2, GLUCOSE, BUN,  CREATININE, CALCIUM, MG, PHOS in the last 168 hours. GFR: CrCl cannot be calculated (Patient's most recent lab result is older than the maximum 21 days allowed.). Recent Labs  Lab 06/22/21 1442  WBC 10.5    Liver Function Tests: No results for input(s): AST, ALT, ALKPHOS, BILITOT, PROT, ALBUMIN in the last 168 hours. No results for input(s): LIPASE, AMYLASE in the last 168 hours. No results for input(s): AMMONIA in the last 168 hours.  ABG    Component Value Date/Time   PHART 7.42 03/09/2016 1004   PCO2ART 35 03/09/2016 1004   PO2ART 78 (L) 03/09/2016 1004   HCO3 28.3 (H) 06/22/2021 1442   ACIDBASEDEF 1.3 03/09/2016 1004   O2SAT 77.6 06/22/2021 1442     Coagulation Profile: No results for input(s): INR, PROTIME in the last 168 hours.  Cardiac Enzymes: No results for input(s): CKTOTAL, CKMB, CKMBINDEX, TROPONINI in the last 168 hours.  HbA1C: Hemoglobin A1C  Date/Time Value Ref Range Status  02/17/2013 04:17 AM 9.2 (H) 4.2 - 6.3 % Final    Comment:    The American Diabetes Association recommends that a primary goal of therapy should be <7% and that physicians should reevaluate the treatment regimen in patients with HbA1c values consistently >8%.    Hgb A1c MFr Bld  Date/Time Value Ref Range Status  04/15/2020 07:18 PM 7.9 (H) 4.8 - 5.6 % Final    Comment:    (NOTE) Pre diabetes:          5.7%-6.4%  Diabetes:              >6.4%  Glycemic control for   <7.0% adults with diabetes   11/09/2019 02:28 AM 6.9 (H) 4.8 - 5.6 % Final    Comment:    (NOTE) Pre diabetes:          5.7%-6.4%  Diabetes:              >6.4%  Glycemic control for   <7.0% adults with diabetes     CBG: Recent Labs  Lab 06/22/21 1456  GLUCAP 305*    Review of Systems:   Unable to assess pt mechanically intubated   Past Medical History:  He,  has a past medical history of Arthritis, Cancer ([place]), COPD (chronic obstructive pulmonary disease) ([place]), Diabetes mellitus without  complication ([place]), Hypertension, Renal disorder, and Stroke ([place]).   Surgical History:   Past Surgical History:  Procedure Laterality Date   APPENDECTOMY     THROAT SURGERY       Social History:   reports that he has been smoking cigarettes. He has a 30.50 pack-year smoking history. He has never used smokeless tobacco. He reports that he does not drink alcohol and does not use drugs.   Family History:  His family history includes Congestive Heart Failure in his father; Other in his mother.   Allergies Allergies  Allergen Reactions   Norco [Hydrocodone-Acetaminophen] Shortness Of Breath     Home Medications  Prior to Admission medications   Medication Sig Start Date End Date Taking? Authorizing  Provider  acetaminophen (TYLENOL) 325 MG tablet Take 650 mg by mouth every 6 (six) hours as needed.    [provider]  amLODipine (NORVASC) 10 MG tablet Take 1 tablet (10 mg total) by mouth daily. 11/11/19   Swayze, Ava, DO  aspirin EC 81 MG tablet Take 81 mg by mouth daily.    [provider]  atorvastatin (LIPITOR) 80 MG tablet Take 80 mg by mouth at bedtime.    [provider]  azelastine (ASTELIN) 0.1 % nasal spray Place 1 spray into both nostrils 2 (two) times daily as needed for rhinitis.    [provider]  budesonide-formoterol (SYMBICORT) 160-4.5 MCG/ACT inhaler Inhale 2 puffs into the lungs 2 (two) times daily.    [provider]  carboxymethylcellul-glycerin (OPTIVE) 0.5-0.9 % ophthalmic solution Place 1 drop into both eyes 4 (four) times daily.    [provider]  Cholecalciferol (VITAMIN D) 50 MCG (2000 UT) CAPS Take 1 capsule by mouth daily.    [provider]  citalopram (CELEXA) 20 MG tablet Take 20 mg by mouth daily.    [provider]  doxazosin (CARDURA) 4 MG tablet Take 1 tablet (4 mg total) by mouth at bedtime. 11/11/19   Swayze, Ava, DO  doxycycline (VIBRAMYCIN) 100 MG capsule Take 1 capsule (100 mg  total) by mouth 2 (two) times daily. 01/13/20   Rodriguez-Southworth, Sunday Spillers, PA-C  gabapentin (NEURONTIN) 300 MG capsule Take 300 mg by mouth daily.     [provider]  guaiFENesin (MUCINEX) 600 MG 12 hr tablet Take 1 tablet (600 mg total) by mouth 2 (two) times daily. 11/11/19   Swayze, Ava, DO  insulin glargine (LANTUS) 100 UNIT/ML injection Inject 10 Units into the skin at bedtime.     [provider]  insulin regular (NOVOLIN R) 100 units/mL injection Inject 8 Units into the skin 3 (three) times daily before meals.    [provider]  ipratropium-albuterol (DUONEB) 0.5-2.5 (3) MG/3ML SOLN Take 3 mLs by nebulization every 6 (six) hours as needed.    [provider]  omeprazole (PRILOSEC) 20 MG capsule Take 20 mg by mouth daily.    [provider]  tamsulosin (FLOMAX) 0.4 MG CAPS capsule Take 0.8 mg by mouth at bedtime.    [provider]  traZODone (DESYREL) 50 MG tablet Take 50 mg by mouth at bedtime.    [provider]  vitamin B-12 (CYANOCOBALAMIN) 1000 MCG tablet Take 1,000 mcg by mouth daily.    [provider]  Vitamin D, Ergocalciferol, (DRISDOL) 1.25 MG (50000 UNIT) CAPS capsule Take 1 capsule (50,000 Units total) by mouth every 7 (seven) days. 11/11/19   Swayze, Ava, DO       DVT/GI PRX  assessed I Assessed the need for Labs I Assessed the need for Foley I Assessed the need for Central Venous Line Family Discussion when available I Assessed the need for Mobilization I made an Assessment of medications to be adjusted accordingly Safety Risk assessment completed  CASE DISCUSSED IN MULTIDISCIPLINARY ROUNDS WITH ICU TEAM     Critical Care Time devoted to patient care services described in this note is 65  minutes.  Critical care was necessary to treat /prevent imminent and life-threatening deterioration. Overall, patient is critically ill, prognosis is guarded.  Patient with Multiorgan failure and at high risk  for cardiac arrest and death.    Corrin Parker, M.D.  [facility]  Medical Director [place]  Director [facility] Cardio-Pulmonary Department

## 2021-06-22 NOTE — ED Notes (Signed)
Xray at BS 

## 2021-06-22 NOTE — ED Provider Notes (Addendum)
3:06 PM Assumed care for off going team.   Blood pressure (!) 168/74, pulse 60, resp. rate 18, weight 72.6 kg, SpO2 100 %.  See their HPI for full report but in brief patient was intubated due to failure of BiPAP.  Somewhat patient might of been at the [place] yesterday and left AMA for fluid overload.  Patient be started on propofol for sedation.  We will consult the ICU team   Patient's hemoglobin is at 9.8 slightly downtrending from prior Glucose slightly elevated at 305 but no evidence of anion gap elevation .  Does appear that he is got new kidney failure with a creatinine of 4.1 Initial troponin was negative VBG reassuring  Chest x-ray reviewed by myself without any evidence of interstitial edema ET tube in place.   I discussed case with Dr. Mortimer Fries who added on CT head prior to being admitted to the ICU.   Marland KitchenCritical Care Performed by: Vanessa Poyen, MD Authorized by: Vanessa Marathon, MD   Critical care provider statement:    Critical care time (minutes):  30   Critical care was necessary to treat or prevent imminent or life-threatening deterioration of the following conditions:  Respiratory failure   Critical care was time spent personally by me on the following activities:  Development of treatment plan with patient or surrogate, discussions with consultants, evaluation of patient's response to treatment, examination of patient, ordering and review of laboratory studies, ordering and review of radiographic studies, ordering and performing treatments and interventions, pulse oximetry, re-evaluation of patient's condition and review of old charts         Vanessa [place], MD 06/22/21 2947    Vanessa [place], MD 06/22/21 [phone]

## 2021-06-22 NOTE — Progress Notes (Signed)
Pt was transported to CT and CCU while on the vent.

## 2021-06-22 NOTE — ED Notes (Signed)
Responds to central pain. Awoke and followed command to blink eyes, endorsed pain. Updated.

## 2021-06-22 NOTE — ED Provider Notes (Signed)
[facility] Provider Note    Event Date/Time   First MD Initiated Contact with Patient 06/22/21 1443     (approximate)   History   Shortness of Breath   HPI  Phillip Reyes is a 81 y.o. male with reported history of CHF, COPD, reportedly recently hospitalized at the [facility] for what EMS was told was a month here with shortness of breath.  Reportedly, he signed out AMA yesterday though they wanted to keep him.  Per report, EMS was called for acute worsening of shortness of breath.  He was reportedly found hypoxic and distressed.  He was placed on BiPAP with initial improvement though he has been increasingly drowsy in route.  He is full code according to EMS and what they had asked him originally.  Remainder of history limited due to mental status  Level 5 caveat invoked as remainder of history, ROS, and physical exam limited due to patient's mental status.      Physical Exam   Triage Vital Signs: ED Triage Vitals  Enc Vitals Group     BP 06/22/21 1444 (!) 110/48     Pulse Rate 06/22/21 1444 60     Resp 06/22/21 1444 20     Temp --      Temp src --      SpO2 06/22/21 1435 (!) 77 %     Weight 06/22/21 1446 160 lb (72.6 kg)     Height --      Head Circumference --      Peak Flow --      Pain Score 06/22/21 1446 0     Pain Loc --      Pain Edu? --      Excl. in [place]? --     Most recent vital signs: Vitals:   06/22/21 1500 06/22/21 1504  BP: 119/60 (!) 168/74  Pulse: (!) 59 60  Resp: 18 18  SpO2: 100% 100%     General: Markedly drowsy, unable to answer questions fully. CV:  Good peripheral perfusion.  No appreciable murmurs though limited due to transmitted upper airway sounds on BiPAP. Resp:  Increased work of breathing with bilateral wheezing and diminished aeration. Abd:  No distention.  Other:  1+ pitting edema lower extremities bilaterally.   ED Results / Procedures / Treatments   Labs (all labs ordered are listed, but only abnormal  results are displayed) Labs Reviewed  CBG MONITORING, ED - Abnormal; Notable for the following components:      Result Value   Glucose-Capillary 305 (*)    All other components within normal limits  RESP PANEL BY RT-PCR (FLU A&B, COVID) ARPGX2  CULTURE, BLOOD (ROUTINE X 2)  CULTURE, BLOOD (ROUTINE X 2)  CBC WITH DIFFERENTIAL/PLATELET  COMPREHENSIVE METABOLIC PANEL  BRAIN NATRIURETIC PEPTIDE  BLOOD GAS, VENOUS  LACTIC ACID, PLASMA  LACTIC ACID, PLASMA  TROPONIN I (HIGH SENSITIVITY)     EKG Normal sinus rhythm, ventricular rate 60.  PR 199, QRS 21, QTc 466.  No acute ST elevations or depressions.  No EKG evidence of acute ischemic infarct   RADIOLOGY Chest x-ray: No active disease   I also independently reviewed and agree wit radiologist interpretations.   PROCEDURES:  Critical Care performed: No  .Critical Care Performed by: Duffy Bruce, MD Authorized by: Duffy Bruce, MD   Critical care provider statement:    Critical care time (minutes):  30   Critical care time was exclusive of:  Separately billable procedures and treating  other patients   Critical care was necessary to treat or prevent imminent or life-threatening deterioration of the following conditions:  Cardiac failure, circulatory failure and respiratory failure   Critical care was time spent personally by me on the following activities:  Development of treatment plan with patient or surrogate, discussions with consultants, evaluation of patient's response to treatment, examination of patient, ordering and review of laboratory studies, ordering and review of radiographic studies, ordering and performing treatments and interventions, pulse oximetry, re-evaluation of patient's condition and review of old charts   I assumed direction of critical care for this patient from another provider in my specialty: no     Care discussed with: admitting provider   Procedure Name: Intubation Date/Time: 06/22/2021 3:09  PM Performed by: Duffy Bruce, MD Pre-anesthesia Checklist: Patient identified, Patient being monitored, Emergency Drugs available, Timeout performed and Suction available Oxygen Delivery Method: Non-rebreather mask Preoxygenation: Pre-oxygenation with 100% oxygen Induction Type: Rapid sequence Ventilation: Mask ventilation without difficulty Laryngoscope Size: Glidescope and 4 Grade View: Grade I Tube size: 8.0 mm Number of attempts: 1 Placement Confirmation: ETT inserted through vocal cords under direct vision, CO2 detector and Breath sounds checked- equal and bilateral Secured at: 24 cm Tube secured with: ETT holder Dental Injury: Teeth and Oropharynx as per pre-operative assessment  Difficulty Due To: Difficulty was unanticipated Future Recommendations: Recommend- induction with short-acting agent, and alternative techniques readily available       MEDICATIONS ORDERED IN ED: Medications  norepinephrine (LEVOPHED) 4mg  in 252mL (0.016 mg/mL) premix infusion (0 mcg/min Intravenous Paused 06/22/21 1505)  fentaNYL (SUBLIMAZE) injection 50 mcg (has no administration in time range)  fentaNYL (SUBLIMAZE) injection 50 mcg (has no administration in time range)  propofol (DIPRIVAN) 1000 MG/100ML infusion (has no administration in time range)  etomidate (AMIDATE) injection 20 mg (20 mg Intravenous Given 06/22/21 1459)  rocuronium (ZEMURON) injection 70 mg (70 mg Intravenous Given 06/22/21 1500)  ipratropium-albuterol (DUONEB) 0.5-2.5 (3) MG/3ML nebulizer solution 3 mL (3 mLs Nebulization Given 06/22/21 1510)  ipratropium-albuterol (DUONEB) 0.5-2.5 (3) MG/3ML nebulizer solution 3 mL (3 mLs Nebulization Given 06/22/21 1509)     IMPRESSION / MDM / [place] / ED COURSE  I reviewed the triage vital signs and the nursing notes.                               The patient is on the cardiac monitor to evaluate for evidence of arrhythmia and/or significant heart rate  changes.   Ddx:  COPD exacerbation, CHF, pneumonia, ACS, renal failure   MDM:  81 year old male with past medical history as above including CHF and COPD here with shortness of breath.  On arrival, patient drowsy, difficult to arouse, pulling very small tidal volumes on BiPAP.  Patient reportedly full code per records and EMS report.  Decision subsequently made to intubate, patient tolerated well.  Chest x-ray, lab work, all sent.  I reviewed his previous hospitalization and discharge summaries, which show fairly frequent prior visits for COPD and CHF.  The available papers from the [place] notes CHF and patient reportedly had had a significant amount of fluid diuresed.  No available recent lab work.  We will plan to admit to the ICU, follow-up x-ray and labs.Dr. Jari Pigg to follow-up labs, XR.   MEDICATIONS GIVEN IN ED: Medications  norepinephrine (LEVOPHED) 4mg  in 229mL (0.016 mg/mL) premix infusion (0 mcg/min Intravenous Paused 06/22/21 1505)  fentaNYL (SUBLIMAZE) injection 50 mcg (has  no administration in time range)  fentaNYL (SUBLIMAZE) injection 50 mcg (has no administration in time range)  propofol (DIPRIVAN) 1000 MG/100ML infusion (has no administration in time range)  etomidate (AMIDATE) injection 20 mg (20 mg Intravenous Given 06/22/21 1459)  rocuronium (ZEMURON) injection 70 mg (70 mg Intravenous Given 06/22/21 1500)  ipratropium-albuterol (DUONEB) 0.5-2.5 (3) MG/3ML nebulizer solution 3 mL (3 mLs Nebulization Given 06/22/21 1510)  ipratropium-albuterol (DUONEB) 0.5-2.5 (3) MG/3ML nebulizer solution 3 mL (3 mLs Nebulization Given 06/22/21 1509)     Consults:  ICU pending   EMR reviewed  H&P, DC summaries for COPD Dec 2021     FINAL CLINICAL IMPRESSION(S) / ED DIAGNOSES   Final diagnoses:  Acute respiratory failure with hypoxia ([place])     Rx / DC Orders   ED Discharge Orders     None        Note:  This document was prepared using Dragon voice recognition software and  may include unintentional dictation errors.   Duffy Bruce, MD 06/22/21 [phone]

## 2021-06-22 NOTE — ED Notes (Signed)
EDP and RT at [facility], preparing to intubate. Stable on CPAP.

## 2021-06-22 NOTE — ED Notes (Signed)
Drs. Myrene Buddy and Jari Pigg at [facility]

## 2021-06-22 NOTE — Plan of Care (Signed)

## 2021-06-22 NOTE — Progress Notes (Signed)
[place] for Electrolyte Monitoring and Replacement   Recent Labs: Potassium (mmol/L)  Date Value  06/22/2021 4.6  02/17/2013 3.9   Magnesium (mg/dL)  Date Value  04/15/2020 2.0  02/17/2013 1.6 (L)   Calcium (mg/dL)  Date Value  06/22/2021 7.6 (L)   Calcium, Total (mg/dL)  Date Value  02/17/2013 8.0 (L)   Albumin (g/dL)  Date Value  06/22/2021 3.3 (L)  02/16/2013 3.1 (L)   Sodium (mmol/L)  Date Value  06/22/2021 136  02/17/2013 142   Assessment: Patient is a 81 y/o M with medical history including T2DM, HTN, COPD, CKD, history of CVA, reported recent hospitalization at the [place] for a month who presented with respiratory distress and altered mental status. Patient required intubation in the emergency department. Disposition is the ICU. Pharmacy consulted to assist with electrolyte monitoring and replacement as indicated.  Goal of Therapy:  Electrolytes within normal limits  Plan:  --Patient with apparent AKI on CKD, Scr 4.1. Though no recent lab values available since > 1 yr prior (Scr 2.74 in 04/2020) --No electrolyte replacement indicated at this time. Will follow-up electrolytes with AM labs tomorrow including magnesium and potassium  Benita Gutter 06/22/2021 4:07 PM

## 2021-06-23 ENCOUNTER — Inpatient Hospital Stay: Payer: No Typology Code available for payment source

## 2021-06-23 DIAGNOSIS — R401 Stupor: Secondary | ICD-10-CM

## 2021-06-23 DIAGNOSIS — J441 Chronic obstructive pulmonary disease with (acute) exacerbation: Secondary | ICD-10-CM

## 2021-06-23 DIAGNOSIS — J9601 Acute respiratory failure with hypoxia: Secondary | ICD-10-CM | POA: Diagnosis not present

## 2021-06-23 DIAGNOSIS — N179 Acute kidney failure, unspecified: Secondary | ICD-10-CM | POA: Diagnosis not present

## 2021-06-23 DIAGNOSIS — J9602 Acute respiratory failure with hypercapnia: Secondary | ICD-10-CM

## 2021-06-23 LAB — URINALYSIS, COMPLETE (UACMP) WITH MICROSCOPIC
Bacteria, UA: NONE SEEN
Bilirubin Urine: NEGATIVE
Glucose, UA: NEGATIVE mg/dL
Hgb urine dipstick: NEGATIVE
Ketones, ur: NEGATIVE mg/dL
Leukocytes,Ua: NEGATIVE
Nitrite: NEGATIVE
Protein, ur: NEGATIVE mg/dL
Specific Gravity, Urine: 1.015 (ref 1.005–1.030)
pH: 5 (ref 5.0–8.0)

## 2021-06-23 LAB — BLOOD GAS, ARTERIAL
Acid-Base Excess: 2.9 mmol/L — ABNORMAL HIGH (ref 0.0–2.0)
Bicarbonate: 25.9 mmol/L (ref 20.0–28.0)
FIO2: 40 %
MECHVT: 500 mL
O2 Saturation: 99.1 %
PEEP: 5 cmH2O
Patient temperature: 37
RATE: 16 resp/min
pCO2 arterial: 34 mmHg (ref 32–48)
pH, Arterial: 7.49 — ABNORMAL HIGH (ref 7.35–7.45)
pO2, Arterial: 186 mmHg — ABNORMAL HIGH (ref 83–108)

## 2021-06-23 LAB — TRIGLYCERIDES: Triglycerides: 68 mg/dL (ref <150)

## 2021-06-23 LAB — CBC
HCT: 32.1 % — ABNORMAL LOW (ref 39.0–52.0)
Hemoglobin: 10.8 g/dL — ABNORMAL LOW (ref 13.0–17.0)
MCH: 31.1 pg (ref 26.0–34.0)
MCHC: 33.6 g/dL (ref 30.0–36.0)
MCV: 92.5 fL (ref 80.0–100.0)
Platelets: 207 10*3/uL (ref 150–400)
RBC: 3.47 MIL/uL — ABNORMAL LOW (ref 4.22–5.81)
RDW: 13.8 % (ref 11.5–15.5)
WBC: 17.7 10*3/uL — ABNORMAL HIGH (ref 4.0–10.5)
nRBC: 0 % (ref 0.0–0.2)

## 2021-06-23 LAB — BASIC METABOLIC PANEL
Anion gap: 11 (ref 5–15)
BUN: 67 mg/dL — ABNORMAL HIGH (ref 8–23)
CO2: 23 mmol/L (ref 22–32)
Calcium: 8.2 mg/dL — ABNORMAL LOW (ref 8.9–10.3)
Chloride: 103 mmol/L (ref 98–111)
Creatinine, Ser: 4.1 mg/dL — ABNORMAL HIGH (ref 0.61–1.24)
GFR, Estimated: 14 mL/min — ABNORMAL LOW (ref >60)
Glucose, Bld: 258 mg/dL — ABNORMAL HIGH (ref 70–99)
Potassium: 4.3 mmol/L (ref 3.5–5.1)
Sodium: 137 mmol/L (ref 135–145)

## 2021-06-23 LAB — GLUCOSE, CAPILLARY
Glucose-Capillary: 224 mg/dL — ABNORMAL HIGH (ref 70–99)
Glucose-Capillary: 247 mg/dL — ABNORMAL HIGH (ref 70–99)
Glucose-Capillary: 208 mg/dL — ABNORMAL HIGH (ref 70–99)
Glucose-Capillary: 174 mg/dL — ABNORMAL HIGH (ref 70–99)
Glucose-Capillary: 173 mg/dL — ABNORMAL HIGH (ref 70–99)
Glucose-Capillary: 196 mg/dL — ABNORMAL HIGH (ref 70–99)

## 2021-06-23 LAB — MAGNESIUM: Magnesium: 2.3 mg/dL (ref 1.7–2.4)

## 2021-06-23 LAB — PHOSPHORUS: Phosphorus: 4.2 mg/dL (ref 2.5–4.6)

## 2021-06-23 MED ORDER — VITAL AF 1.2 CAL PO LIQD
1000.0000 mL | ORAL | Status: DC
  Administered 2021-06-23: 1000 mL

## 2021-06-23 MED ORDER — PROSOURCE TF PO LIQD
45.0000 mL | Freq: Three times a day (TID) | ORAL | Status: DC
  Administered 2021-06-23 – 2021-06-24 (×2): 45 mL
  Filled 2021-06-23 (×4): qty 45

## 2021-06-23 MED ORDER — FREE WATER
30.0000 mL | Status: DC
  Administered 2021-06-23 – 2021-06-24 (×5): 30 mL

## 2021-06-23 NOTE — Progress Notes (Addendum)
Initial Nutrition Assessment  DOCUMENTATION CODES:   Non-severe (moderate) malnutrition in context of chronic illness  INTERVENTION:   Vital 1.2@50ml /hr- Initiate at 78ml/hr and increase by 75ml/hr q 8 hours until goal rate is reached.   Pro-Source 21ml TID via tube, provides 40kcal and 11g of protein per serving   Free water flushes 39ml q4 hours to maintain tube patency   Propofol: 10.89 ml/hr- provides 287kcal/day   Regimen provides 1847kcal/day, 123g/day protein and 1126ml/day of free water   Pt at high refeed risk; recommend monitor potassium, magnesium and phosphorus labs daily until stable  NUTRITION DIAGNOSIS:   Moderate Malnutrition related to chronic illness (COPD) as evidenced by mild fat depletion, moderate muscle depletion.  GOAL:   Provide needs based on ASPEN/SCCM guidelines  MONITOR:   Diet advancement, Vent status, Labs, Weight trends, TF tolerance, Skin, I & O's  REASON FOR ASSESSMENT:   Ventilator    ASSESSMENT:   81 y/o male with h/o DM, HTN, COPD, stroke, CHF, CKD III, pacemaker and PVD who is admitted with respiratory failure.  Pt sedated and ventilated. OGT in place. Plan is to start tube feeds today. There is no documented weight history to determine if any recent significant weight changes.   Medications reviewed and include: heparin, insulin, pepcid, propofol  Labs reviewed: K 4.3 wnl, BUN 67(H), creat 4.10(H), P 4.2 wnl, Mg 2.3 wnl Wbc- 17.7(H), Hgb 10.8(L), Hct 32.1(L) Cbgs- 208, 247, 224 x 24 hrs AIC- 6.7(H)- 2/15  Patient is currently intubated on ventilator support MV: 8.0 L/min Temp (24hrs), Avg:97.1 F (36.2 C), Min:96.3 F (35.7 C), Max:98.4 F (36.9 C)  Propofol: 10.89 ml/hr- provides 287kcal/day   MAP- >93mmHg  UOP- 672ml/hr   NUTRITION - FOCUSED PHYSICAL EXAM:  Flowsheet Row Most Recent Value  Orbital Region No depletion  Upper Arm Region Mild depletion  Thoracic and Lumbar Region Mild depletion  Buccal Region  No depletion  Temple Region No depletion  Clavicle Bone Region Mild depletion  Clavicle and Acromion Bone Region Mild depletion  Scapular Bone Region No depletion  Dorsal Hand No depletion  Patellar Region Severe depletion  Anterior Thigh Region Moderate depletion  Posterior Calf Region Moderate depletion  Edema (RD Assessment) None  Hair Reviewed  Eyes Reviewed  Mouth Reviewed  Skin Reviewed  Nails Reviewed   Diet Order:   Diet Order             Diet NPO time specified  Diet effective now                  EDUCATION NEEDS:   No education needs have been identified at this time  Skin:  Skin Assessment: Reviewed RN Assessment (ecchymosis)  Last BM:  pta  Height:   Ht Readings from Last 1 Encounters:  06/22/21 5\' 10"  (1.778 m)    Weight:   Wt Readings from Last 1 Encounters:  06/23/21 77.9 kg    Ideal Body Weight:  75.45 kg  BMI:  Body mass index is 24.64 kg/m.  Estimated Nutritional Needs:   Kcal:  1639kcal/day  Protein:  115-130g/day  Fluid:  1.9-2.2L/day  Koleen Distance MS, RD, LDN Please refer to [facility] for RD and/or RD on-call/weekend/after hours pager

## 2021-06-23 NOTE — Progress Notes (Signed)
[place] for Electrolyte Monitoring and Replacement   Recent Labs: Potassium (mmol/L)  Date Value  06/23/2021 4.3  02/17/2013 3.9   Magnesium (mg/dL)  Date Value  06/23/2021 2.3  02/17/2013 1.6 (L)   Calcium (mg/dL)  Date Value  06/23/2021 8.2 (L)   Calcium, Total (mg/dL)  Date Value  02/17/2013 8.0 (L)   Albumin (g/dL)  Date Value  06/22/2021 3.3 (L)  02/16/2013 3.1 (L)   Phosphorus (mg/dL)  Date Value  06/23/2021 4.2   Sodium (mmol/L)  Date Value  06/23/2021 137  02/17/2013 142   Corrected Ca: 8.76 mg/dL  Assessment: Patient is a 81 y/o M with medical history including T2DM, HTN, COPD, CKD, history of CVA, reported recent hospitalization at the [place] for a month who presented with respiratory distress and altered mental status. Patient required intubation in the emergency department. Disposition is the ICU. Pharmacy consulted to assist with electrolyte monitoring and replacement as indicated.  Goal of Therapy:  Electrolytes within normal limits  Plan:  No electrolyte replacement indicated at this time Will follow-up electrolytes with AM labs tomorrow including magnesium and potassium  Dallie Piles 06/23/2021 7:17 AM

## 2021-06-23 NOTE — Plan of Care (Signed)

## 2021-06-23 NOTE — TOC Initial Note (Signed)
Transition of Care [facility]) - Initial/Assessment Note    Patient Details  Name: Phillip Reyes MRN: [id number] Date of Birth: Jan 15, 1941  Transition of Care [facility]) CM/SW Contact:    Shelbie Hutching, RN Phone Number: 06/23/2021, 10:45 AM  Clinical Narrative:                 Patient admitted to the hospital with sepsis.  [facility]  Patient required intubation in the emergency department.  Currently intubated and sedated.  TOC will follow.   Expected Discharge Plan:  (TBD) Barriers to Discharge: Continued Medical Work up   Patient Goals and CMS Choice Patient states their goals for this hospitalization and ongoing recovery are:: patient unable to state- intubated      Expected Discharge Plan and Services Expected Discharge Plan:  (TBD)   Discharge Planning Services: CM Consult   Living arrangements for the past 2 months: Single Family Home                 DME Arranged: N/A DME Agency: NA       HH Arranged: NA [place] Agency: NA        Prior Living Arrangements/Services Living arrangements for the past 2 months: Single Family Home Lives with:: Spouse Patient language and need for interpreter reviewed:: Yes        Need for Family Participation in Patient Care: Yes (Comment) Care giver support system in place?: Yes (comment) Current home services: DME (cane, walker) Criminal Activity/Legal Involvement Pertinent to Current Situation/Hospitalization: No - Comment as needed  Activities of Daily Living   ADL Screening (condition at time of admission) Is the patient deaf or have difficulty hearing?:  (uta) Does the patient have difficulty seeing, even when wearing glasses/contacts?:  (uta) Does the patient have difficulty concentrating, remembering, or making decisions?:  [place]) Does the patient have difficulty dressing or bathing?:  (uta) Does the patient have difficulty walking or climbing stairs?:  [place])  Permission Sought/Granted   Permission  granted to share information with : No              Emotional Assessment   Attitude/Demeanor/Rapport: Intubated (Following Commands or Not Following Commands) Affect (typically observed): Unable to Assess   Alcohol / Substance Use: Not Applicable Psych Involvement: No (comment)  Admission diagnosis:  Acute respiratory failure (HCC) [J96.00] Acute respiratory failure with hypoxia (HCC) [J96.01] Patient Active Problem List   Diagnosis Date Noted   Acute respiratory failure ([place]) 06/22/2021   COPD with acute exacerbation ([place]) 04/16/2020   CAP (community acquired pneumonia) 01/27/2019   Altered mental status 03/09/2016   Acute encephalopathy 06/11/2015   Type 2 diabetes mellitus ([place]) 06/11/2015   HTN (hypertension) 06/11/2015   COPD (chronic obstructive pulmonary disease) ([place]) 06/11/2015   CKD (chronic kidney disease), stage III ([place]) 06/11/2015   H/O: stroke 06/11/2015   PCP:  [facility], [place]:   [place], [place] [address] [place] [address] Phone: [phone] Fax: [place], [place] [address] Phone: [phone] Fax: [phone]     Social Determinants of Health (SDOH) Interventions    Readmission Risk Interventions No flowsheet data found.

## 2021-06-23 NOTE — H&P (Signed)
NAME:  Javyn Havlin, MRN:  [id number], DOB:  Jun 10, 1940, LOS: 1 ADMISSION DATE:  06/22/2021, CONSULTATION DATE: 06/22/2021 REFERRING MD: Dr. Ellender Hose, CHIEF COMPLAINT: Shortness of Breath   History of Present Illness:  This is an 81 yo male who presented to [facility] ER on 02/15 with shortness of breath.  Per ER notes EMS reported the pt was recently hospitalized at the [facility] for 1 month with acute respiratory failure in the setting of CHF requiring aggressive diuresis, however he signed out AMA on 02/14.  Upon EMS arrival at pts home he was hypoxic in acute respiratory distress.  He was placed on Bipap with initial improvement, however en route to the ER pt became increasingly drowsy.    ED course Upon arrival to the ER pt difficult to arouse with low tidal volumes on Bipap.  Therefore, pt required emergent mechanical intubation.  CXR negative for acute abnormality.  Lab results revealed glucose 295, BUN 63, creatinine 4.10, albumin 3.3, hgb 9.8, and COVID-19/Influenza PCR A&B negative.    Pertinent  Medical History  Arthritis COPD Type II Diabetes Mellitus  HTN Stroke  PVD Cancer  Renal Disorder   Significant Hospital Events: Including procedures, antibiotic start and stop dates in addition to other pertinent events   02/15: Pt admitted to ICU with acute on chronic respiratory failure and acute encephalopathy requiring mechanical intubation   Interim History / Subjective:  Pt mechanically ventilated and sedated at this time   Objective   Blood pressure (!) 106/50, pulse 60, temperature (!) 97.3 F (36.3 C), temperature source Axillary, resp. rate 16, height 5' 10"  (1.778 m), weight 77.9 kg, SpO2 98 %.    Vent Mode: PRVC FiO2 (%):  [24 %-40 %] 24 % Set Rate:  [16 bmp] 16 bmp Vt Set:  [500 mL] 500 mL PEEP:  [5 cmH20] 5 cmH20 Plateau Pressure:  [25 cmH20] 25 cmH20   Intake/Output Summary (Last 24 hours) at 06/23/2021 1537 Last data filed at 06/23/2021 1416 Gross per 24 hour  Intake  364.6 ml  Output 920 ml  Net -555.4 ml   Filed Weights   06/22/21 1446 06/22/21 1735 06/23/21 0335  Weight: 72.6 kg 77.7 kg 77.9 kg    REVIEW OF SYSTEMS  PATIENT IS UNABLE TO PROVIDE COMPLETE REVIEW OF SYSTEMS DUE TO SEVERE CRITICAL ILLNESS AND TOXIC METABOLIC ENCEPHALOPATHY    PHYSICAL EXAMINATION:  GENERAL:critically ill appearing, +resp distress EYES: Pupils equal, round, reactive to light.  No scleral icterus.  MOUTH: Moist mucosal membrane. INTUBATED NECK: Supple.  PULMONARY: +rhonchi, +wheezing CARDIOVASCULAR: S1 and S2.  No murmurs  GASTROINTESTINAL: Soft, nontender, -distended. Positive bowel sounds.  MUSCULOSKELETAL: No swelling, clubbing, or edema.  NEUROLOGIC: obtunded SKIN:intact,warm,dry   Resolved Hospital Problem list    Assessment & Plan:   Acute on chronic respiratory failure etiology unclear at this time differentials include: possible PE vs. CVA vs. 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)  Lab Results  Component Value Date   CREATININE 4.10 (H) 06/23/2021   BUN 67 (H) 06/23/2021   NA 137 06/23/2021   K 4.3 06/23/2021   CL 103 06/23/2021  CO2 23 06/23/2021    Intake/Output Summary (Last 24 hours) at 06/23/2021 1614 Last data filed at 06/23/2021 1600 Gross per 24 hour  Intake 364.6 ml  Output 1035 ml  Net -670.4 ml    - Trend BMP  - Replace electrolytes as indicated  - Monitor UOP - Will consider nephrology consult in the am if renal function worsens  - Cautious volumes state even to mild positive follow creatinine   Type II diabetes mellitus  - CBG's q4hrs  - Hemoglobin A1c  pending  - SSI   Acute encephalopathy etiology unclear concerning for possible CVA       CT Head 02/15 negative for acute intracranial pathology concerning for age-related atrophy and chronic microvascular ischemic changes  - Urine drug screen, ethanol level, and salicylate all under detection limits - Wean sedation and re-evaluate  Best Practice (right click and "Reselect all SmartList Selections" daily)   Diet/type: NPO DVT prophylaxis: prophylactic heparin  GI prophylaxis: H2B Lines: N/A Foley:  Yes, and it is still needed Code Status:  full code   Labs   CBC: Recent Labs  Lab 06/22/21 1442 06/23/21 0458  WBC 10.5 17.7*  NEUTROABS 7.4  --   HGB 9.8* 10.8*  HCT 30.8* 32.1*  MCV 97.2 92.5  PLT 192 207     Basic Metabolic Panel: Recent Labs  Lab 06/22/21 1442 06/23/21 0315  NA 136 137  K 4.6 4.3  CL 101 103  CO2 26 23  GLUCOSE 295* 258*  BUN 63* 67*  CREATININE 4.10* 4.10*  CALCIUM 7.6* 8.2*  MG  --  2.3  PHOS  --  4.2   GFR: Estimated Creatinine Clearance: 14.8 mL/min (A) (by C-G formula based on SCr of 4.1 mg/dL (H)). Recent Labs  Lab 06/22/21 1442 06/22/21 1443 06/22/21 1636 06/23/21 0458  WBC 10.5  --   --  17.7*  LATICACIDVEN  --  1.9 1.9  --      Liver Function Tests: Recent Labs  Lab 06/22/21 1442  AST 13*  ALT 18  ALKPHOS 74  BILITOT 0.7  PROT 5.8*  ALBUMIN 3.3*   No results for input(s): LIPASE, AMYLASE in the last 168 hours. No results for input(s): AMMONIA in the last 168 hours.  ABG    Component Value Date/Time   PHART 7.49 (H) 06/23/2021 0500   PCO2ART 34 06/23/2021 0500   PO2ART 186 (H) 06/23/2021 0500   HCO3 25.9 06/23/2021 0500   ACIDBASEDEF 1.3 03/09/2016 1004   O2SAT 99.1 06/23/2021 0500      Coagulation Profile: No results for input(s): INR, PROTIME in the last 168 hours.  Cardiac Enzymes: No results for input(s): CKTOTAL, CKMB, CKMBINDEX, TROPONINI in the last 168 hours.  HbA1C: Hemoglobin A1C   Date/Time Value Ref Range Status  02/17/2013 04:17 AM 9.2 (H) 4.2 - 6.3 % Final    Comment:    The American Diabetes Association recommends that a primary goal of therapy should be <7% and that physicians should reevaluate the treatment regimen in patients with HbA1c values consistently >8%.    Hgb A1c MFr Bld  Date/Time Value Ref Range Status  06/22/2021 04:36 PM 6.7 (H) 4.8 - 5.6 % Final    Comment:    (NOTE) Pre diabetes:          5.7%-6.4%  Diabetes:              >6.4%  Glycemic control for   <7.0% adults with diabetes   04/15/2020 07:18 PM  7.9 (H) 4.8 - 5.6 % Final    Comment:    (NOTE) Pre diabetes:          5.7%-6.4%  Diabetes:              >6.4%  Glycemic control for   <7.0% adults with diabetes     CBG: Recent Labs  Lab 06/22/21 1928 06/22/21 2317 06/23/21 0328 06/23/21 0744 06/23/21 1128  GLUCAP 217* 241* 224* 247* 208*     Review of Systems:   Unable to assess pt mechanically intubated   Past Medical History:  He,  has a past medical history of Arthritis, Cancer ([place]), COPD (chronic obstructive pulmonary disease) ([place]), Diabetes mellitus without complication ([place]), Hypertension, Renal disorder, and Stroke ([place]).   Surgical History:   Past Surgical History:  Procedure Laterality Date   APPENDECTOMY     THROAT SURGERY       Social History:   reports that he has been smoking cigarettes. He has a 30.50 pack-year smoking history. He has never used smokeless tobacco. He reports that he does not drink alcohol and does not use drugs.   Family History:  His family history includes Congestive Heart Failure in his father; Other in his mother.   Allergies Allergies  Allergen Reactions   Norco [Hydrocodone-Acetaminophen] Shortness Of Breath     Home Medications  Prior to Admission medications   Medication Sig Start Date End Date Taking? Authorizing Provider  acetaminophen (TYLENOL) 325 MG tablet Take 650 mg by mouth every 6 (six) hours as  needed.    [provider]  amLODipine (NORVASC) 10 MG tablet Take 1 tablet (10 mg total) by mouth daily. 11/11/19   Swayze, Ava, DO  aspirin EC 81 MG tablet Take 81 mg by mouth daily.    [provider]  atorvastatin (LIPITOR) 80 MG tablet Take 80 mg by mouth at bedtime.    [provider]  azelastine (ASTELIN) 0.1 % nasal spray Place 1 spray into both nostrils 2 (two) times daily as needed for rhinitis.    [provider]  budesonide-formoterol (SYMBICORT) 160-4.5 MCG/ACT inhaler Inhale 2 puffs into the lungs 2 (two) times daily.    [provider]  carboxymethylcellul-glycerin (OPTIVE) 0.5-0.9 % ophthalmic solution Place 1 drop into both eyes 4 (four) times daily.    [provider]  Cholecalciferol (VITAMIN D) 50 MCG (2000 UT) CAPS Take 1 capsule by mouth daily.    [provider]  citalopram (CELEXA) 20 MG tablet Take 20 mg by mouth daily.    [provider]  doxazosin (CARDURA) 4 MG tablet Take 1 tablet (4 mg total) by mouth at bedtime. 11/11/19   Swayze, Ava, DO  doxycycline (VIBRAMYCIN) 100 MG capsule Take 1 capsule (100 mg total) by mouth 2 (two) times daily. 01/13/20   Rodriguez-Southworth, Sunday Spillers, PA-C  gabapentin (NEURONTIN) 300 MG capsule Take 300 mg by mouth daily.     [provider]  guaiFENesin (MUCINEX) 600 MG 12 hr tablet Take 1 tablet (600 mg total) by mouth 2 (two) times daily. 11/11/19   Swayze, Ava, DO  insulin glargine (LANTUS) 100 UNIT/ML injection Inject 10 Units into the skin at bedtime.     [provider]  insulin regular (NOVOLIN R) 100 units/mL injection Inject 8 Units into the skin 3 (three) times daily before meals.    [provider]  ipratropium-albuterol (DUONEB) 0.5-2.5 (3) MG/3ML SOLN Take 3 mLs by nebulization every 6 (six) hours as needed.  [provider]  omeprazole (PRILOSEC) 20 MG capsule Take 20 mg by mouth daily.    [provider]  tamsulosin  (FLOMAX) 0.4 MG CAPS capsule Take 0.8 mg by mouth at bedtime.    [provider]  traZODone (DESYREL) 50 MG tablet Take 50 mg by mouth at bedtime.    [provider]  vitamin B-12 (CYANOCOBALAMIN) 1000 MCG tablet Take 1,000 mcg by mouth daily.    [provider]  Vitamin D, Ergocalciferol, (DRISDOL) 1.25 MG (50000 UNIT) CAPS capsule Take 1 capsule (50,000 Units total) by mouth every 7 (seven) days. 11/11/19   Swayze, Ava, DO       DVT/GI PRX  assessed I Assessed the need for Labs I Assessed the need for Foley I Assessed the need for Central Venous Line Family Discussion when available I Assessed the need for Mobilization I made an Assessment of medications to be adjusted accordingly Safety Risk assessment completed  CASE DISCUSSED IN MULTIDISCIPLINARY ROUNDS WITH ICU TEAM     Critical Care Time devoted to patient care services described in this note is 45  minutes.  Critical care was necessary to treat /prevent imminent and life-threatening deterioration. Overall, patient is critically ill, prognosis is guarded.  Patient with Multiorgan failure and at high risk for cardiac arrest and death.  //Terrence Pizana

## 2021-06-23 NOTE — Progress Notes (Signed)
Inpatient Diabetes Program Recommendations  AACE/ADA: New Consensus Statement on Inpatient Glycemic Control (2015)  Target Ranges:  Prepandial:   less than 140 mg/dL      Peak postprandial:   less than 180 mg/dL (1-2 hours)      Critically ill patients:  140 - 180 mg/dL   Lab Results  Component Value Date   GLUCAP 247 (H) 06/23/2021   HGBA1C 6.7 (H) 06/22/2021    Review of Glycemic Control  Latest Reference Range & Units 04/16/20 08:22 06/22/21 14:56 06/22/21 16:22 06/22/21 19:28 06/22/21 23:17 06/23/21 03:28 06/23/21 07:44  Glucose-Capillary 70 - 99 mg/dL 266 (H) 305 (H) 296 (H) 217 (H) 241 (H) 224 (H) 247 (H)  (H): Data is abnormally high Diabetes history: DM2 Outpatient Diabetes medications: Lantus 10 units q hs, Novolin R 3-10 units tid meal coverage Current orders for Inpatient glycemic control: Novolog 0-9 units q 4 hrs.  Inpatient Diabetes Program Recommendations:   While patient intubated and NPO: consider: -ICU glycemic control order set -Add Levemir 5 q 12 hrs.  Thank you, Nani Gasser. Ronith Berti, RN, MSN, CDE  Diabetes Coordinator Inpatient Glycemic Control Team Team Pager [phone] (8am-5pm) 06/23/2021 11:25 AM

## 2021-06-24 DIAGNOSIS — J9600 Acute respiratory failure, unspecified whether with hypoxia or hypercapnia: Secondary | ICD-10-CM

## 2021-06-24 DIAGNOSIS — D72821 Monocytosis (symptomatic): Secondary | ICD-10-CM | POA: Diagnosis not present

## 2021-06-24 DIAGNOSIS — J9601 Acute respiratory failure with hypoxia: Secondary | ICD-10-CM | POA: Diagnosis not present

## 2021-06-24 DIAGNOSIS — E440 Moderate protein-calorie malnutrition: Secondary | ICD-10-CM | POA: Insufficient documentation

## 2021-06-24 LAB — BASIC METABOLIC PANEL
Anion gap: 9 (ref 5–15)
BUN: 78 mg/dL — ABNORMAL HIGH (ref 8–23)
CO2: 25 mmol/L (ref 22–32)
Calcium: 8 mg/dL — ABNORMAL LOW (ref 8.9–10.3)
Chloride: 105 mmol/L (ref 98–111)
Creatinine, Ser: 4.14 mg/dL — ABNORMAL HIGH (ref 0.61–1.24)
GFR, Estimated: 14 mL/min — ABNORMAL LOW (ref >60)
Glucose, Bld: 237 mg/dL — ABNORMAL HIGH (ref 70–99)
Potassium: 4.5 mmol/L (ref 3.5–5.1)
Sodium: 139 mmol/L (ref 135–145)

## 2021-06-24 LAB — CBC
HCT: 29.6 % — ABNORMAL LOW (ref 39.0–52.0)
Hemoglobin: 9.7 g/dL — ABNORMAL LOW (ref 13.0–17.0)
MCH: 31.3 pg (ref 26.0–34.0)
MCHC: 32.8 g/dL (ref 30.0–36.0)
MCV: 95.5 fL (ref 80.0–100.0)
Platelets: 199 10*3/uL (ref 150–400)
RBC: 3.1 MIL/uL — ABNORMAL LOW (ref 4.22–5.81)
RDW: 14.5 % (ref 11.5–15.5)
WBC: 24.2 10*3/uL — ABNORMAL HIGH (ref 4.0–10.5)
nRBC: 0 % (ref 0.0–0.2)

## 2021-06-24 LAB — GLUCOSE, CAPILLARY
Glucose-Capillary: 228 mg/dL — ABNORMAL HIGH (ref 70–99)
Glucose-Capillary: 216 mg/dL — ABNORMAL HIGH (ref 70–99)
Glucose-Capillary: 172 mg/dL — ABNORMAL HIGH (ref 70–99)
Glucose-Capillary: 199 mg/dL — ABNORMAL HIGH (ref 70–99)
Glucose-Capillary: 210 mg/dL — ABNORMAL HIGH (ref 70–99)
Glucose-Capillary: 182 mg/dL — ABNORMAL HIGH (ref 70–99)

## 2021-06-24 LAB — MAGNESIUM: Magnesium: 2.3 mg/dL (ref 1.7–2.4)

## 2021-06-24 MED ORDER — INSULIN ASPART 100 UNIT/ML IJ SOLN: 0.0000 [IU] | INTRAMUSCULAR | Status: DC

## 2021-06-24 MED ORDER — IPRATROPIUM-ALBUTEROL 0.5-2.5 (3) MG/3ML IN SOLN
3.0000 mL | Freq: Three times a day (TID) | RESPIRATORY_TRACT | Status: DC
  Administered 2021-06-24 – 2021-06-26 (×7): 3 mL via RESPIRATORY_TRACT
  Filled 2021-06-24 (×6): qty 3

## 2021-06-24 MED ORDER — INSULIN ASPART 100 UNIT/ML IJ SOLN
0.0000 [IU] | INTRAMUSCULAR | Status: DC
  Administered 2021-06-24: 3 [IU] via SUBCUTANEOUS
  Administered 2021-06-24 – 2021-06-25 (×2): 5 [IU] via SUBCUTANEOUS
  Administered 2021-06-25 (×2): 3 [IU] via SUBCUTANEOUS
  Filled 2021-06-24 (×4): qty 1

## 2021-06-24 MED ORDER — DEXAMETHASONE SODIUM PHOSPHATE 10 MG/ML IJ SOLN
10.0000 mg | Freq: Once | INTRAMUSCULAR | Status: AC
  Administered 2021-06-24: 10 mg via INTRAVENOUS
  Filled 2021-06-24: qty 1

## 2021-06-24 MED ORDER — CHLORHEXIDINE GLUCONATE CLOTH 2 % EX PADS
6.0000 | MEDICATED_PAD | Freq: Every day | CUTANEOUS | Status: DC
  Administered 2021-06-24 – 2021-06-28 (×3): 6 via TOPICAL

## 2021-06-24 MED ORDER — ALBUTEROL SULFATE (2.5 MG/3ML) 0.083% IN NEBU
2.5000 mg | INHALATION_SOLUTION | RESPIRATORY_TRACT | Status: DC | PRN
Start: 2021-06-24 — End: 2021-06-29
  Administered 2021-06-27: 2.5 mg via RESPIRATORY_TRACT
  Filled 2021-06-24: qty 3

## 2021-06-24 MED ORDER — SODIUM CHLORIDE 0.9 % IV SOLN
3.0000 g | Freq: Two times a day (BID) | INTRAVENOUS | Status: AC
  Administered 2021-06-24 – 2021-06-29 (×10): 3 g via INTRAVENOUS
  Filled 2021-06-24: qty 8
  Filled 2021-06-24: qty 3
  Filled 2021-06-24: qty 8
  Filled 2021-06-24: qty 3
  Filled 2021-06-24: qty 8
  Filled 2021-06-24: qty 3
  Filled 2021-06-24: qty 8
  Filled 2021-06-24: qty 3
  Filled 2021-06-24 (×2): qty 8
  Filled 2021-06-24: qty 3
  Filled 2021-06-24: qty 8

## 2021-06-24 MED ORDER — CHLORHEXIDINE GLUCONATE CLOTH 2 % EX PADS: 6.0000 | MEDICATED_PAD | Freq: Every day | CUTANEOUS | Status: DC

## 2021-06-24 NOTE — Progress Notes (Signed)
Nutrition Follow-up  DOCUMENTATION CODES:   Non-severe (moderate) malnutrition in context of chronic illness  INTERVENTION:   -RD will follow for diet advancement and add supplements as appropriate -If pt unable to swallow safely and is within goals of care, consider initiation of nutrition support:  Initiate Osmolite 1.5 @ 25 ml/hr and increase by 10 ml every 8 hours to goal rate of 55 ml/hr.   45 ml Prosource TF BID.  160 ml free water flush every 4 hours    Tube feeding regimen provides 2060 kcal (100% of needs), 105 grams of protein, and 1006 ml of H2O. 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  Ongoing  GOAL:   Patient will meet greater than or equal to 90% of their needs  Unmet  MONITOR:   PO intake, Supplement acceptance, Diet advancement, Labs, Weight trends, Skin, I & O's  REASON FOR ASSESSMENT:   Ventilator    ASSESSMENT:   81 y/o male with h/o DM, HTN, COPD, stroke, CHF, CKD III, pacemaker and PVD who is admitted with respiratory failure.  Reviewed I/O's: +339 ml x 24 hours and -87 ml since admission  UOP: 875 ml x 24 hours   Case discussed with RN, MD, and during ICU rounds. Pt just extubated. Per RN notes, pt failed yale swallow screen due to lethargy.   Labs reviewed: CBGS: [id number] (inpatient orders for glycemic control are 0-9 units insulin aspart every 4 hours).    Diet Order:   Diet Order             Diet NPO time specified  Diet effective now                   EDUCATION NEEDS:   No education needs have been identified at this time  Skin:  Skin Assessment: Reviewed RN Assessment (ecchymosis)  Last BM:  06/24/21  Height:   Ht Readings from Last 1 Encounters:  06/24/21 5\' 10"  (1.778 m)    Weight:   Wt Readings from Last 1 Encounters:  06/24/21 77.7 kg    Ideal Body Weight:  75.45  kg  BMI:  Body mass index is 24.58 kg/m.  Estimated Nutritional Needs:   Kcal:  1950-2150  Protein:  100-115 grams  Fluid:  > 1.9 L    Loistine Chance, RD, LDN, [place] Registered Dietitian II Certified Diabetes Care and Education Specialist Please refer to [facility] for RD and/or RD on-call/weekend/after hours pager

## 2021-06-24 NOTE — Plan of Care (Signed)

## 2021-06-24 NOTE — Progress Notes (Addendum)
NAME:  Phillip Reyes, MRN:  [id number], DOB:  1940-11-02, LOS: 2 ADMISSION DATE:  06/22/2021, CONSULTATION DATE: 06/22/2021 REFERRING MD: Dr. Ellender Hose, CHIEF COMPLAINT: Shortness of Breath   History of Present Illness:  This is an 81 yo male who presented to [facility] ER on 02/15 with shortness of breath.  Per ER notes EMS reported the pt was recently hospitalized at the [facility] for 1 month with acute respiratory failure in the setting of CHF requiring aggressive diuresis, however he signed out AMA on 02/14.  Upon EMS arrival at pts home he was hypoxic in acute respiratory distress.  He was placed on Bipap with initial improvement, however en route to the ER pt became increasingly drowsy.    ED course Upon arrival to the ER pt difficult to arouse with low tidal volumes on Bipap.  Therefore, pt required emergent mechanical intubation.  CXR negative for acute abnormality.  Lab results revealed glucose 295, BUN 63, creatinine 4.10, albumin 3.3, hgb 9.8, and COVID-19/Influenza PCR A&B negative.    Pertinent  Medical History  Arthritis COPD Type II Diabetes Mellitus  HTN Stroke  PVD Cancer  Renal Disorder   Significant Hospital Events: Including procedures, antibiotic start and stop dates in addition to other pertinent events   02/15: Pt admitted to ICU with acute on chronic respiratory failure and acute encephalopathy requiring mechanical intubation   Interim History / Subjective:  Pt mechanically ventilated and sedated at this time   Objective   Blood pressure 101/70, pulse (!) 58, temperature 97.6 F (36.4 C), temperature source Axillary, resp. rate 17, height 5\' 10"  (1.778 m), weight 77.7 kg, SpO2 96 %.    Vent Mode: PRVC FiO2 (%):  [28 %-40 %] 28 % Set Rate:  [16 bmp] 16 bmp Vt Set:  [500 mL] 500 mL PEEP:  [5 cmH20] 5 cmH20 Plateau Pressure:  [20 cmH20-21 cmH20] 21 cmH20   Intake/Output Summary (Last 24 hours) at 06/24/2021 1524 Last data filed at 06/24/2021 1500 Gross per 24 hour   Intake 1497.61 ml  Output 1075 ml  Net 422.61 ml    Filed Weights   06/22/21 1735 06/23/21 0335 06/24/21 0354  Weight: 77.7 kg 77.9 kg 77.7 kg    REVIEW OF SYSTEMS  PATIENT IS UNABLE TO PROVIDE COMPLETE REVIEW OF SYSTEMS DUE TO SEVERE CRITICAL ILLNESS AND TOXIC METABOLIC ENCEPHALOPATHY    PHYSICAL EXAMINATION:  GENERAL:critically ill appearing, +resp distress EYES: Pupils equal, round, reactive to light.  No scleral icterus.  MOUTH: Moist mucosal membrane. INTUBATED NECK: Supple.  PULMONARY: +rhonchi, +wheezing CARDIOVASCULAR: S1 and S2.  No murmurs  GASTROINTESTINAL: Soft, nontender, -distended. Positive bowel sounds.  MUSCULOSKELETAL: No swelling, clubbing, or edema.  NEUROLOGIC: obtunded SKIN:intact,warm,dry   Resolved Hospital Problem list    Assessment & Plan:   Acute on chronic respiratory failure etiology unclear at this time differentials include: possible PE vs. CVA vs. Seizure activity Mechanical ventilation  Hx: COPD  -CXR clear, negative viral screen - SBT passed. 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)  Lab Results  Component Value Date   CREATININE 4.14 (H) 06/24/2021   BUN 78 (H) 06/24/2021   NA 139 06/24/2021   K 4.5 06/24/2021   CL 105 06/24/2021   CO2 25 06/24/2021    Intake/Output Summary (Last 24 hours) at  06/24/2021 1524 Last data filed at 06/24/2021 1500 Gross per 24 hour  Intake 1497.61 ml  Output 1075 ml  Net 422.61 ml   - Trend BMP  - Replace electrolytes as indicated  - Monitor UOP - Will consider nephrology consult in the am if renal function worsens  - Creatinine remains 4.10+/- - Cautious volumes state even to mild positive follow creatinine   Type II diabetes mellitus  - CBG's q4hrs  - Hemoglobin A1c pending  - SSI    ID: Leukocytosis advancing despite clear chest Resend Cx Unasyn started  Acute encephalopathy etiology unclear concerning for possible CVA       CT Head 02/15 negative for acute intracranial pathology concerning for age-related atrophy and chronic microvascular ischemic changes  - Urine drug screen, ethanol level, and salicylate all under detection limits - Wean sedation and re-evaluate  Best Practice (right click and "Reselect all SmartList Selections" daily)   Diet/type: NPO DVT prophylaxis: prophylactic heparin  GI prophylaxis: H2B Lines: N/A Foley:  Yes, and it is still needed Code Status:  full code   Labs   CBC: Recent Labs  Lab 06/22/21 1442 06/23/21 0458 06/24/21 0409  WBC 10.5 17.7* 24.2*  NEUTROABS 7.4  --   --   HGB 9.8* 10.8* 9.7*  HCT 30.8* 32.1* 29.6*  MCV 97.2 92.5 95.5  PLT 192 207 199     Basic Metabolic Panel: Recent Labs  Lab 06/22/21 1442 06/23/21 0315 06/24/21 0409  NA 136 137 139  K 4.6 4.3 4.5  CL 101 103 105  CO2 26 23 25   GLUCOSE 295* 258* 237*  BUN 63* 67* 78*  CREATININE 4.10* 4.10* 4.14*  CALCIUM 7.6* 8.2* 8.0*  MG  --  2.3 2.3  PHOS  --  4.2  --     GFR: Estimated Creatinine Clearance: 14.7 mL/min (A) (by C-G formula based on SCr of 4.14 mg/dL (H)). Recent Labs  Lab 06/22/21 1442 06/22/21 1443 06/22/21 1636 06/23/21 0458 06/24/21 0409  WBC 10.5  --   --  17.7* 24.2*  LATICACIDVEN  --  1.9 1.9  --   --      Liver Function Tests: Recent Labs  Lab 06/22/21 1442  AST 13*  ALT 18  ALKPHOS 74  BILITOT 0.7  PROT 5.8*  ALBUMIN 3.3*    No results for input(s): LIPASE, AMYLASE in the last 168 hours. No results for input(s): AMMONIA in the last 168 hours.  ABG    Component Value Date/Time   PHART 7.49 (H) 06/23/2021 0500   PCO2ART 34 06/23/2021 0500   PO2ART 186 (H) 06/23/2021 0500   HCO3 25.9 06/23/2021 0500   ACIDBASEDEF 1.3 03/09/2016 1004   O2SAT 99.1 06/23/2021 0500      Coagulation Profile: No  results for input(s): INR, PROTIME in the last 168 hours.  Cardiac Enzymes: No results for input(s): CKTOTAL, CKMB, CKMBINDEX, TROPONINI in the last 168 hours.  HbA1C: Hemoglobin A1C  Date/Time Value Ref Range Status  02/17/2013 04:17 AM 9.2 (H) 4.2 - 6.3 % Final    Comment:    The American Diabetes Association recommends that a primary goal of therapy should be <7% and that physicians should reevaluate the treatment regimen in patients with HbA1c values consistently >8%.    Hgb A1c MFr Bld  Date/Time Value Ref Range Status  06/22/2021 04:36 PM 6.7 (H) 4.8 - 5.6 % Final    Comment:    (NOTE) Pre diabetes:  5.7%-6.4%  Diabetes:              >6.4%  Glycemic control for   <7.0% adults with diabetes   04/15/2020 07:18 PM 7.9 (H) 4.8 - 5.6 % Final    Comment:    (NOTE) Pre diabetes:          5.7%-6.4%  Diabetes:              >6.4%  Glycemic control for   <7.0% adults with diabetes     CBG: Recent Labs  Lab 06/23/21 1921 06/23/21 2319 06/24/21 0324 06/24/21 0755 06/24/21 1110  GLUCAP 173* 196* 228* 216* 172*     Review of Systems:   Unable to assess pt mechanically intubated   Past Medical History:  He,  has a past medical history of Arthritis, Cancer ([place]), COPD (chronic obstructive pulmonary disease) ([place]), Diabetes mellitus without complication ([place]), Hypertension, Renal disorder, and Stroke ([place]).   Surgical History:   Past Surgical History:  Procedure Laterality Date   APPENDECTOMY     THROAT SURGERY       Social History:   reports that he has been smoking cigarettes. He has a 30.50 pack-year smoking history. He has never used smokeless tobacco. He reports that he does not drink alcohol and does not use drugs.   Family History:  His family history includes Congestive Heart Failure in his father; Other in his mother.   Allergies Allergies  Allergen Reactions   Norco [Hydrocodone-Acetaminophen] Shortness Of Breath     Home Medications   Prior to Admission medications   Medication Sig Start Date End Date Taking? Authorizing Provider  acetaminophen (TYLENOL) 325 MG tablet Take 650 mg by mouth every 6 (six) hours as needed.    [provider]  amLODipine (NORVASC) 10 MG tablet Take 1 tablet (10 mg total) by mouth daily. 11/11/19   Swayze, Ava, DO  aspirin EC 81 MG tablet Take 81 mg by mouth daily.    [provider]  atorvastatin (LIPITOR) 80 MG tablet Take 80 mg by mouth at bedtime.    [provider]  azelastine (ASTELIN) 0.1 % nasal spray Place 1 spray into both nostrils 2 (two) times daily as needed for rhinitis.    [provider]  budesonide-formoterol (SYMBICORT) 160-4.5 MCG/ACT inhaler Inhale 2 puffs into the lungs 2 (two) times daily.    [provider]  carboxymethylcellul-glycerin (OPTIVE) 0.5-0.9 % ophthalmic solution Place 1 drop into both eyes 4 (four) times daily.    [provider]  Cholecalciferol (VITAMIN D) 50 MCG (2000 UT) CAPS Take 1 capsule by mouth daily.    [provider]  citalopram (CELEXA) 20 MG tablet Take 20 mg by mouth daily.    [provider]  doxazosin (CARDURA) 4 MG tablet Take 1 tablet (4 mg total) by mouth at bedtime. 11/11/19   Swayze, Ava, DO  doxycycline (VIBRAMYCIN) 100 MG capsule Take 1 capsule (100 mg total) by mouth 2 (two) times daily. 01/13/20   Rodriguez-Southworth, Sunday Spillers, PA-C  gabapentin (NEURONTIN) 300 MG capsule Take 300 mg by mouth daily.     [provider]  guaiFENesin (MUCINEX) 600 MG 12 hr tablet Take 1 tablet (600 mg total) by mouth 2 (two) times daily. 11/11/19   Swayze, Ava, DO  insulin glargine (LANTUS) 100 UNIT/ML injection Inject 10 Units into the skin at bedtime.     [provider]  insulin regular (NOVOLIN R) 100 units/mL injection Inject 8 Units into the skin  3 (three) times daily before meals.    [provider]  ipratropium-albuterol (DUONEB) 0.5-2.5 (3) MG/3ML SOLN Take 3  mLs by nebulization every 6 (six) hours as needed.    [provider]  omeprazole (PRILOSEC) 20 MG capsule Take 20 mg by mouth daily.    [provider]  tamsulosin (FLOMAX) 0.4 MG CAPS capsule Take 0.8 mg by mouth at bedtime.    [provider]  traZODone (DESYREL) 50 MG tablet Take 50 mg by mouth at bedtime.    [provider]  vitamin B-12 (CYANOCOBALAMIN) 1000 MCG tablet Take 1,000 mcg by mouth daily.    [provider]  Vitamin D, Ergocalciferol, (DRISDOL) 1.25 MG (50000 UNIT) CAPS capsule Take 1 capsule (50,000 Units total) by mouth every 7 (seven) days. 11/11/19   Swayze, Ava, DO       DVT/GI PRX  assessed I Assessed the need for Labs I Assessed the need for Foley I Assessed the need for Central Venous Line Family Discussion when available I Assessed the need for Mobilization I made an Assessment of medications to be adjusted accordingly Safety Risk assessment completed  CASE DISCUSSED IN MULTIDISCIPLINARY ROUNDS WITH ICU TEAM     Critical Care Time devoted to patient care services described in this note is 55  minutes.  Critical care was necessary to treat /prevent imminent and life-threatening deterioration. Overall, patient is critically ill, prognosis is guarded.  Patient with Multiorgan failure and at high risk for cardiac arrest and death.  //Baneen Wieseler

## 2021-06-24 NOTE — Progress Notes (Signed)
0929 weaning trail started Sedation decreased then turned off Completely CC Dr at bedside 1000 patient TV greater than 40 tolerating vent weaning well following commands very weak. 1100 patient extubated to [place] patient lethargic   1300 patient drooling unable to clear secretions  1500 patient more alert able to communicate asking for bedpan 1600 patient failed yale swallow screening will not stay awake long enough to complete patient also unable to clear secretions

## 2021-06-24 NOTE — Progress Notes (Signed)
Neb ordered due to protocol assessment/CXR/pt breath sounds

## 2021-06-24 NOTE — Progress Notes (Signed)
Shift Summary: Patient was able to follow all simple commands throughout the shift. PRN Fentanyl required x 2 for RASS +2. Propofol infusing at 56mcg. Peripheral Levophed stopped at shift change. Lungs clear. TF infusing at goal rate. Increase in oral secretions. CRT worsening despite 459ml output. Daughter updated on patient condition.

## 2021-06-24 NOTE — Progress Notes (Signed)
[name] for Electrolyte Monitoring and Replacement   Recent Labs: Potassium (mmol/L)  Date Value  06/24/2021 4.5  02/17/2013 3.9   Magnesium (mg/dL)  Date Value  06/24/2021 2.3  02/17/2013 1.6 (L)   Calcium (mg/dL)  Date Value  06/24/2021 8.0 (L)   Calcium, Total (mg/dL)  Date Value  02/17/2013 8.0 (L)   Albumin (g/dL)  Date Value  06/22/2021 3.3 (L)  02/16/2013 3.1 (L)   Phosphorus (mg/dL)  Date Value  06/23/2021 4.2   Sodium (mmol/L)  Date Value  06/24/2021 139  02/17/2013 142   Corrected Ca: 8.76 mg/dL  Assessment: Patient is a 81 y/o M with medical history including T2DM, HTN, COPD, CKD, history of CVA, reported recent hospitalization at the [place] for a month who presented with respiratory distress and altered mental status. Patient required intubation in the emergency department. Disposition is the ICU. Pharmacy consulted to assist with electrolyte monitoring and replacement as indicated.  Goal of Therapy:  Electrolytes within normal limits  Plan:  No electrolyte replacement indicated at this time Will follow-up electrolytes with AM labs tomorrow including magnesium and potassium  Phillip Reyes 06/24/2021 7:18 AM

## 2021-06-24 NOTE — Progress Notes (Signed)
Patient on peripheral Levophed but no U/S guided PIV. Levophed placed on hold. BP stable. Will continue to monitor.

## 2021-06-25 ENCOUNTER — Inpatient Hospital Stay
Admit: 2021-06-25 | Discharge: 2021-06-25 | Disposition: A | Discharge status: Home / Self Care | Payer: No Typology Code available for payment source | Attending: Internal Medicine | Admitting: Internal Medicine

## 2021-06-25 DIAGNOSIS — N179 Acute kidney failure, unspecified: Secondary | ICD-10-CM | POA: Diagnosis not present

## 2021-06-25 DIAGNOSIS — J9600 Acute respiratory failure, unspecified whether with hypoxia or hypercapnia: Secondary | ICD-10-CM | POA: Diagnosis not present

## 2021-06-25 LAB — CBC
HCT: 29.1 % — ABNORMAL LOW (ref 39.0–52.0)
Hemoglobin: 9.5 g/dL — ABNORMAL LOW (ref 13.0–17.0)
MCH: 31.3 pg (ref 26.0–34.0)
MCHC: 32.6 g/dL (ref 30.0–36.0)
MCV: 95.7 fL (ref 80.0–100.0)
Platelets: 188 10*3/uL (ref 150–400)
RBC: 3.04 MIL/uL — ABNORMAL LOW (ref 4.22–5.81)
RDW: 14.6 % (ref 11.5–15.5)
WBC: 18.7 10*3/uL — ABNORMAL HIGH (ref 4.0–10.5)
nRBC: 0 % (ref 0.0–0.2)

## 2021-06-25 LAB — GLUCOSE, CAPILLARY
Glucose-Capillary: 155 mg/dL — ABNORMAL HIGH (ref 70–99)
Glucose-Capillary: 164 mg/dL — ABNORMAL HIGH (ref 70–99)
Glucose-Capillary: 202 mg/dL — ABNORMAL HIGH (ref 70–99)
Glucose-Capillary: 189 mg/dL — ABNORMAL HIGH (ref 70–99)
Glucose-Capillary: 285 mg/dL — ABNORMAL HIGH (ref 70–99)
Glucose-Capillary: 304 mg/dL — ABNORMAL HIGH (ref 70–99)
Glucose-Capillary: 169 mg/dL — ABNORMAL HIGH (ref 70–99)

## 2021-06-25 LAB — MAGNESIUM: Magnesium: 2.4 mg/dL (ref 1.7–2.4)

## 2021-06-25 LAB — BASIC METABOLIC PANEL
Anion gap: 9 (ref 5–15)
BUN: 86 mg/dL — ABNORMAL HIGH (ref 8–23)
CO2: 24 mmol/L (ref 22–32)
Calcium: 8.1 mg/dL — ABNORMAL LOW (ref 8.9–10.3)
Chloride: 106 mmol/L (ref 98–111)
Creatinine, Ser: 4.2 mg/dL — ABNORMAL HIGH (ref 0.61–1.24)
GFR, Estimated: 14 mL/min — ABNORMAL LOW (ref >60)
Glucose, Bld: 187 mg/dL — ABNORMAL HIGH (ref 70–99)
Potassium: 5 mmol/L (ref 3.5–5.1)
Sodium: 139 mmol/L (ref 135–145)

## 2021-06-25 LAB — PHOSPHORUS: Phosphorus: 5.9 mg/dL — ABNORMAL HIGH (ref 2.5–4.6)

## 2021-06-25 MED ORDER — INSULIN GLARGINE-YFGN 100 UNIT/ML ~~LOC~~ SOLN
5.0000 [IU] | Freq: Every day | SUBCUTANEOUS | Status: DC
  Administered 2021-06-25 – 2021-06-29 (×5): 5 [IU] via SUBCUTANEOUS
  Filled 2021-06-25 (×6): qty 0.05

## 2021-06-25 MED ORDER — INSULIN ASPART 100 UNIT/ML IJ SOLN
0.0000 [IU] | Freq: Three times a day (TID) | INTRAMUSCULAR | Status: DC
  Administered 2021-06-25: 3 [IU] via SUBCUTANEOUS
  Filled 2021-06-25: qty 1

## 2021-06-25 MED ORDER — LACTATED RINGERS IV BOLUS
500.0000 mL | Freq: Once | INTRAVENOUS | Status: AC
  Administered 2021-06-25: 500 mL via INTRAVENOUS

## 2021-06-25 MED ORDER — BUDESONIDE 0.25 MG/2ML IN SUSP
0.2500 mg | Freq: Two times a day (BID) | RESPIRATORY_TRACT | Status: DC
  Administered 2021-06-25 – 2021-06-29 (×9): 0.25 mg via RESPIRATORY_TRACT
  Filled 2021-06-25 (×9): qty 2

## 2021-06-25 MED ORDER — INSULIN ASPART 100 UNIT/ML IJ SOLN
0.0000 [IU] | Freq: Three times a day (TID) | INTRAMUSCULAR | Status: DC
  Administered 2021-06-25: 11 [IU] via SUBCUTANEOUS
  Administered 2021-06-26: 3 [IU] via SUBCUTANEOUS
  Administered 2021-06-26 (×2): 5 [IU] via SUBCUTANEOUS
  Administered 2021-06-26: 3 [IU] via SUBCUTANEOUS
  Administered 2021-06-27: 2 [IU] via SUBCUTANEOUS
  Administered 2021-06-27: 8 [IU] via SUBCUTANEOUS
  Administered 2021-06-27: 1 [IU] via SUBCUTANEOUS
  Administered 2021-06-27: 2 [IU] via SUBCUTANEOUS
  Administered 2021-06-28 (×2): 3 [IU] via SUBCUTANEOUS
  Administered 2021-06-28: 2 [IU] via SUBCUTANEOUS
  Administered 2021-06-29: 3 [IU] via SUBCUTANEOUS
  Administered 2021-06-29: 2 [IU] via SUBCUTANEOUS
  Filled 2021-06-25 (×15): qty 1

## 2021-06-25 MED ORDER — FOOD THICKENER (SIMPLYTHICK)
1.0000 | ORAL | Status: DC | PRN
  Filled 2021-06-25: qty 1

## 2021-06-25 NOTE — Progress Notes (Signed)
*  PRELIMINARY RESULTS* Echocardiogram 2D Echocardiogram has been performed.  Phillip Reyes 06/25/2021, 4:27 PM

## 2021-06-25 NOTE — Evaluation (Signed)
Physical Therapy Evaluation Patient Details Name: Phillip Reyes MRN: [id number] DOB: 23-May-1940 Today's Date: 06/25/2021  History of Present Illness  Pt is an 81 y/o M admitted on 06/22/21 after presenting to the ED with c/o SOB. Pt required emergent mechanical intubation & was extubated on 06/24/21. Pt is being treated for acute on chronic respiratory failure possibly 2/2 aspriation PNA although CHF could be a contributing factor. Pt also with acute encephalopathy. Of note, pt was recently hospitalized at the [facility] for 1 month with ARF in the setting of CHF requiring aggressive diuresis but signed out [facility] 2/14. PMH: arthritis, COPD, DM2, HTN, stroke, PVD, CA, renal disorder, tobacco use   Clinical Impression  Pt seen for PT evaluation with co-tx with OT for pt & therapists safety. Pt demonstrates impaired cognition & awareness & requires multimodal cuing & manual facilitation for mobility. Pt is able to transfer to standing but utilized BUE HHA vs RW to eliminate additional new component. Pt is able to take steps along EOB & pivot to recliner but PT & OT provide multimodal cuing for stepping & facilitating pivot. Pt would benefit from STR upon d/c to maximize independence with functional mobility & reduce fall risk prior to return home. Will continue to follow pt acutely to address gait with LRAD, balance, endurance, and safety with mobility.    Recommendations for follow up therapy are one component of a multi-disciplinary discharge planning process, led by the attending physician.  Recommendations may be updated based on patient status, additional functional criteria and insurance authorization.  Follow Up Recommendations Skilled nursing-short term rehab (<3 hours/day)    Assistance Recommended at Discharge Frequent or constant Supervision/Assistance  Patient can return home with the following  A lot of help with walking and/or transfers;A lot of help with bathing/dressing/bathroom;Assistance with  feeding;Assistance with cooking/housework;Direct supervision/assist for financial management;Assist for transportation;Help with stairs or ramp for entrance;Direct supervision/assist for medications management    Equipment Recommendations Rolling walker (2 wheels)  Recommendations for Other Services       Functional Status Assessment Patient has had a recent decline in their functional status and demonstrates the ability to make significant improvements in function in a reasonable and predictable amount of time.     Precautions / Restrictions Precautions Precautions: Fall Restrictions Weight Bearing Restrictions: No      Mobility  Bed Mobility Overal bed mobility: Needs Assistance Bed Mobility: Supine to Sit     Supine to sit: Max assist, HOB elevated     General bed mobility comments: Pt initiates moving BLE towards EOB    Transfers Overall transfer level: Needs assistance Equipment used: Rollator (4 wheels), 2 person hand held assist Transfers: Sit to/from Stand, Bed to chair/wheelchair/BSC Sit to Stand: Max assist, +2 physical assistance (Pt attempts sit>stand from EOB with RW with poor awareness re: safe hand placement. Pt is able to achieve sit>stand but then transitions to HHA instead of RW.)   Step pivot transfers: Max assist, +2 physical assistance (Pt takes steps to L at EOB with max assist HHA +2 with PT providing tactile/verbal cuing for stepping with either BLE, assistance with weight shifting & BLE placement, as well as facilitating pivoting to recliner.)            Ambulation/Gait                  Stairs            Wheelchair Mobility    Modified Rankin (Stroke Patients Only)  Balance Overall balance assessment: Needs assistance Sitting-balance support: Feet supported, Bilateral upper extremity supported Sitting balance-Leahy Scale: Fair Sitting balance - Comments: close supervision static sitting EOB with pt beginning to  demonstrate R lateral lean   Standing balance support: During functional activity, Bilateral upper extremity supported Standing balance-Leahy Scale: Zero                               Pertinent Vitals/Pain Pain Assessment Pain Assessment: Faces Faces Pain Scale: No hurt    Home Living Family/patient expects to be discharged to:: Private residence Living Arrangements: Spouse/significant other Available Help at Discharge: Personal care attendant Type of Home: House Home Access: Ramped entrance       Home Layout: One level        Prior Function               Mobility Comments: Pt unreliable historian. Per chart, pt lives in a 1 level home with ramped entrance where he is the caregiver for his wife (pt confirms this) but she also has an aide that assists him PRN. Pt reports he ambulates without AD but question accuracy.       Hand Dominance        Extremity/Trunk Assessment   Upper Extremity Assessment Upper Extremity Assessment: Generalized weakness    Lower Extremity Assessment Lower Extremity Assessment: Generalized weakness       Communication   Communication:  (no verbal expression, nods head)  Cognition Arousal/Alertness: Awake/alert Behavior During Therapy: WFL for tasks assessed/performed Overall Cognitive Status: Impaired/Different from baseline Area of Impairment: Orientation, Attention, Following commands, Memory, Awareness, Problem solving, Safety/judgement                 Orientation Level: Disoriented to, Situation   Memory: Decreased recall of precautions, Decreased short-term memory Following Commands: Follows one step commands inconsistently Safety/Judgement: Decreased awareness of safety, Decreased awareness of deficits Awareness: Intellectual, Emergent, Anticipatory Problem Solving: Slow processing, Decreased initiation, Requires tactile cues, Requires verbal cues          General Comments General comments (skin  integrity, edema, etc.): Pt received on 2L/min via nasal cannula with pt placed on room air & pt able to maintain >/= 90% on room air throughout session (nurse notified of pt left on room air). Pt maintains mouth open throughout session despite cuing to close mouth to inhale through nose.    Exercises     Assessment/Plan    PT Assessment Patient needs continued PT services  PT Problem List Decreased strength;Decreased mobility;Decreased safety awareness;Decreased range of motion;Decreased coordination;Decreased activity tolerance;Decreased balance;Decreased knowledge of use of DME;Decreased cognition;Cardiopulmonary status limiting activity       PT Treatment Interventions DME instruction;Therapeutic exercise;Gait training;Balance training;Stair training;Neuromuscular re-education;Modalities;Functional mobility training;Patient/family education;Therapeutic activities    PT Goals (Current goals can be found in the Care Plan section)  Acute Rehab PT Goals Patient Stated Goal: none stated PT Goal Formulation: Patient unable to participate in goal setting Time For Goal Achievement: 07/09/21    Frequency Min 2X/week     Co-evaluation PT/OT/SLP Co-Evaluation/Treatment: Yes Reason for Co-Treatment: Complexity of the patient's impairments (multi-system involvement);Necessary to address cognition/behavior during functional activity;For patient/therapist safety PT goals addressed during session: Mobility/safety with mobility;Balance         AM-PAC PT "6 Clicks" Mobility  Outcome Measure Help needed turning from your back to your side while in a flat bed without using bedrails?: A Little Help needed moving  from lying on your back to sitting on the side of a flat bed without using bedrails?: Total Help needed moving to and from a bed to a chair (including a wheelchair)?: Total Help needed standing up from a chair using your arms (e.g., wheelchair or bedside chair)?: Total Help needed to  walk in hospital room?: Total Help needed climbing 3-5 steps with a railing? : Total 6 Click Score: 8    End of Session   Activity Tolerance: Patient tolerated treatment well Patient left: in chair (in care of OT) Nurse Communication: Mobility status (pt on room air) PT Visit Diagnosis: Unsteadiness on feet (R26.81);Muscle weakness (generalized) (M62.81);Difficulty in walking, not elsewhere classified (R26.2)    Time: 1140-1153 PT Time Calculation (min) (ACUTE ONLY): 13 min   Charges:   PT Evaluation $PT Eval Moderate Complexity: [place], PT, DPT 06/25/21, 12:20 PM   Waunita Schooner 06/25/2021, 12:18 PM

## 2021-06-25 NOTE — Progress Notes (Signed)
[place] for Electrolyte Monitoring and Replacement   Recent Labs: Potassium (mmol/L)  Date Value  06/25/2021 5.0  02/17/2013 3.9   Magnesium (mg/dL)  Date Value  06/25/2021 2.4  02/17/2013 1.6 (L)   Calcium (mg/dL)  Date Value  06/25/2021 8.1 (L)   Calcium, Total (mg/dL)  Date Value  02/17/2013 8.0 (L)   Albumin (g/dL)  Date Value  06/22/2021 3.3 (L)  02/16/2013 3.1 (L)   Phosphorus (mg/dL)  Date Value  06/25/2021 5.9 (H)   Sodium (mmol/L)  Date Value  06/25/2021 139  02/17/2013 142   Corrected Ca: 8.76 mg/dL  Assessment: Patient is a 81 y/o M with medical history including T2DM, HTN, COPD, CKD, history of CVA, reported recent hospitalization at the [place] for a month who presented with respiratory distress and altered mental status. Patient required intubation in the emergency department. Disposition is the ICU. Pharmacy consulted to assist with electrolyte monitoring and replacement as indicated.  Goal of Therapy:  Electrolytes within normal limits  Plan:  No electrolyte replacement indicated at this time Will follow-up electrolytes with AM labs tomorrow including magnesium and potassium  Oswald Hillock 06/25/2021 8:21 AM

## 2021-06-25 NOTE — Evaluation (Signed)
Clinical/Bedside Swallow Evaluation Patient Details  Name: Phillip Reyes MRN: [id number] Date of Birth: 22-Jul-1940  Today's Date: 06/25/2021 Time: SLP Start Time (ACUTE ONLY): 0830 SLP Stop Time (ACUTE ONLY): 0900 SLP Time Calculation (min) (ACUTE ONLY): 30 min  Past Medical History:  Past Medical History:  Diagnosis Date   Arthritis    Cancer ([place])    COPD (chronic obstructive pulmonary disease) ([place])    Diabetes mellitus without complication ([place])    Hypertension    Renal disorder    Stroke [facility])    Past Surgical History:  Past Surgical History:  Procedure Laterality Date   APPENDECTOMY     THROAT SURGERY     HPI:  This is an 81 yo male who presented to [facility] ER on 02/15 with shortness of breath.  Per ER notes EMS reported the pt was recently hospitalized at the [facility] for 1 month with acute respiratory failure in the setting of CHF requiring aggressive diuresis, however he signed out AMA on 02/14.  Upon EMS arrival at pts home he was hypoxic in acute respiratory distress.  He was placed on Bipap with initial improvement, however en route to the ER pt became increasingly drowsy requiring emergent intubation on 02/15 thru 02/17.    Assessment / Plan / Recommendation  Clinical Impression  Pt presents with likely reversible pharyngeal dysphagia d/t recent intubation. Pt was emergently intubated for 2 days with extubation on 06/24/2021. While pt's vocal quality is functional, when consuming thin liquids, via spoon, cup and straw as well as nectar thick liquids via cup sips, pt has immediate coughing suspicious for delayed swallow initiation. He remains symptom free when consuming nectar thick liquids via spoon and puree textures. More advanced solids were not assessed d/t severity of coughing with last trial of nectar via cup. Given pt's recent admission and acute illness, recommend conservative diet of dysphagia 1 with nectar thick liquids via SPOON and medicine crushed in puree. ST to follow  for possible diet advancement. SLP Visit Diagnosis: Dysphagia, pharyngeal phase (R13.13)    Aspiration Risk  Moderate aspiration risk    Diet Recommendation Dysphagia 1 (Puree);Nectar-thick liquid   Liquid Administration via: Cup Medication Administration: Crushed with puree Supervision: Full supervision/cueing for compensatory strategies;Staff to assist with self feeding Compensations: Minimize environmental distractions;Slow rate;Small sips/bites Postural Changes: Seated upright at 90 degrees;Remain upright for at least 30 minutes after po intake    Other  Recommendations Oral Care Recommendations: Oral care BID Other Recommendations: Order thickener from pharmacy;Prohibited food (jello, ice cream, thin soups);Remove water pitcher;Have oral suction available    Recommendations for follow up therapy are one component of a multi-disciplinary discharge planning process, led by the attending physician.  Recommendations may be updated based on patient status, additional functional criteria and insurance authorization.  Follow up Recommendations  (TBD)      Assistance Recommended at Discharge Frequent or constant Supervision/Assistance  Functional Status Assessment Patient has had a recent decline in their functional status and demonstrates the ability to make significant improvements in function in a reasonable and predictable amount of time.  Frequency and Duration min 2x/week  2 weeks       Prognosis Prognosis for Safe Diet Advancement: Good      Swallow Study   General Date of Onset: 06/22/21 HPI: This is an 81 yo male who presented to [facility] ER on 02/15 with shortness of breath.  Per ER notes EMS reported the pt was recently hospitalized at the [facility] for 1 month with acute  respiratory failure in the setting of CHF requiring aggressive diuresis, however he signed out [place] on 02/14.  Upon EMS arrival at pts home he was hypoxic in acute respiratory distress.  He was placed on Bipap with  initial improvement, however en route to the ER pt became increasingly drowsy requiring emergent intubation on 02/15 thru 02/17. Type of Study: Bedside Swallow Evaluation Previous Swallow Assessment: BSE 2017 - recommend regular with thin liquids Diet Prior to this Study: NPO Temperature Spikes Noted: No Respiratory Status: Nasal cannula (2 liters) History of Recent Intubation: Yes Length of Intubations (days): 2 days Date extubated: 06/24/21 Behavior/Cognition: Alert;Cooperative;Pleasant mood Oral Cavity Assessment: Within Functional Limits Oral Care Completed by SLP: Recent completion by staff Oral Cavity - Dentition: Adequate natural dentition Vision: Functional for self-feeding Self-Feeding Abilities: Needs assist;Needs set up Patient Positioning: Upright in bed Baseline Vocal Quality: Normal Volitional Cough: Strong Volitional Swallow: Able to elicit    Oral/Motor/Sensory Function Overall Oral Motor/Sensory Function: Within functional limits   Ice Chips Ice chips: Impaired Presentation: Spoon Pharyngeal Phase Impairments: Cough - Delayed;Multiple swallows   Thin Liquid Thin Liquid: Impaired Presentation: Cup;Spoon;Straw;Self Fed Pharyngeal  Phase Impairments: Suspected delayed Swallow;Cough - Immediate;Throat Clearing - Immediate;Wet Vocal Quality;Multiple swallows    Nectar Thick Nectar Thick Liquid: Impaired Presentation: Cup;Spoon;Self Fed Pharyngeal Phase Impairments: Suspected delayed Swallow;Multiple swallows;Cough - Immediate;Wet Vocal Quality (cough with cup sips)   Honey Thick Honey Thick Liquid: Not tested   Puree Puree: Within functional limits Presentation: Spoon   Solid     Solid: Not tested     Phillip Reyes M.S., CCC-SLP, [place] Pathologist Rehabilitation Services Office [phone]  Phillip Reyes 06/25/2021,10:04 AM

## 2021-06-25 NOTE — Progress Notes (Signed)
Only 200cc yellow/clear urine output this shift. Bladder scanned 33cc in bladder. MD made aware.

## 2021-06-25 NOTE — Evaluation (Signed)
Occupational Therapy Evaluation Patient Details Name: Phillip Reyes MRN: [id number] DOB: [birth year] Today's Date: 06/25/2021   History of Present Illness Pt is an 81 y/o M admitted on 06/22/21 after presenting to the ED with c/o SOB. Pt required emergent mechanical intubation & was extubated on 06/24/21. Pt is being treated for acute on chronic respiratory failure possibly 2/2 aspriation PNA although CHF could be a contributing factor. Pt also with acute encephalopathy. Of note, pt was recently hospitalized at the [facility] for 1 month with ARF in the setting of CHF requiring aggressive diuresis but signed out [facility] 2/14. PMH: arthritis, COPD, DM2, HTN, stroke, PVD, CA, renal disorder, tobacco use   Clinical Impression   Pt seen for OT evaluation this date in setting of acute hospitalization d/t SOB. PT/OT co-evaluation completed to maximize services given poor activity tolerance. He is a poor historian d/t some confusion as well as hypophonia, but reports no use of AD for fxl mobility at baseline and reports taking care of his spouse which chart seems to verify. He presents this date with general deconditioning, gross weakness of trunk and limbs, and decreased activity tolerance. He requires MAX A +2 with arm in arm to come to stand and weight shift to take small shuffling steps to his left towards recliner. He currently requires MIN A for self-feeding d/t weakness and MAX A For LB ADLs d/t poor dynamic sitting balance. Pt left in chair to eat lunch and RN updated on pt's status. Note: pt tolerates room air this session.      Recommendations for follow up therapy are one component of a multi-disciplinary discharge planning process, led by the attending physician.  Recommendations may be updated based on patient status, additional functional criteria and insurance authorization.   Follow Up Recommendations  Skilled nursing-short term rehab (<3 hours/day)    Assistance Recommended at Discharge Frequent or  constant Supervision/Assistance  Patient can return home with the following A lot of help with walking and/or transfers;A lot of help with bathing/dressing/bathroom;Assistance with cooking/housework;Assistance with feeding;Direct supervision/assist for medications management;Direct supervision/assist for financial management;Assist for transportation;Help with stairs or ramp for entrance    Functional Status Assessment  Patient has had a recent decline in their functional status and demonstrates the ability to make significant improvements in function in a reasonable and predictable amount of time.  Equipment Recommendations  Other (comment) (defer to next level of care.)    Recommendations for Other Services       Precautions / Restrictions Precautions Precautions: Fall Restrictions Weight Bearing Restrictions: No      Mobility Bed Mobility Overal bed mobility: Needs Assistance Bed Mobility: Supine to Sit     Supine to sit: Max assist, HOB elevated     General bed mobility comments: Pt initiates moving BLE towards EOB    Transfers Overall transfer level: Needs assistance Equipment used: Rollator (4 wheels), 2 person hand held assist Transfers: Sit to/from Stand, Bed to chair/wheelchair/BSC Sit to Stand: Max assist, +2 physical assistance (Pt attempts sit>stand from EOB with RW with poor awareness re: safe hand placement. Pt is able to achieve sit>stand but then transitions to HHA instead of RW.)     Step pivot transfers: Max assist, +2 physical assistance (Pt takes steps to L at EOB with max assist HHA +2 with PT providing tactile/verbal cuing for stepping with either BLE, assistance with weight shifting & BLE placement, as well as facilitating pivoting to recliner.)  Balance Overall balance assessment: Needs assistance Sitting-balance support: Feet supported, Bilateral upper extremity supported Sitting balance-Leahy Scale: Fair Sitting balance - Comments:  close supervision static sitting EOB with pt beginning to demonstrate R lateral lean   Standing balance support: During functional activity, Bilateral upper extremity supported Standing balance-Leahy Scale: Zero                             ADL either performed or assessed with clinical judgement   ADL Overall ADL's : Needs assistance/impaired Eating/Feeding: Minimal assistance;Sitting Eating/Feeding Details (indicate cue type and reason): upright supported in chair             Upper Body Dressing : Moderate assistance;Sitting Upper Body Dressing Details (indicate cue type and reason): based on clinincal observation Lower Body Dressing: Maximal assistance;Sitting/lateral leans Lower Body Dressing Details (indicate cue type and reason): socks Toilet Transfer: Maximal assistance;+2 for physical assistance Toilet Transfer Details (indicate cue type and reason): to take shuffle stepts to chair (simulate commode transfer)         Functional mobility during ADLs: Maximal assistance;+2 for physical assistance (to weight shift for 4-5 small shuffle steps to his left to get from bed to recliner)       Vision   Additional Comments: difficult to formally assess, but seemingly tracks appropriately     Perception     Praxis      Pertinent Vitals/Pain Pain Assessment Pain Assessment: Faces Faces Pain Scale: No hurt     Hand Dominance     Extremity/Trunk Assessment Upper Extremity Assessment Upper Extremity Assessment: Generalized weakness   Lower Extremity Assessment Lower Extremity Assessment: Generalized weakness       Communication Communication Communication: Other (comment) (hypophonic, limited verbal communication)   Cognition Arousal/Alertness: Awake/alert Behavior During Therapy: WFL for tasks assessed/performed Overall Cognitive Status: Impaired/Different from baseline Area of Impairment: Orientation, Attention, Following commands, Memory,  Awareness, Problem solving, Safety/judgement                 Orientation Level: Disoriented to, Situation   Memory: Decreased recall of precautions, Decreased short-term memory Following Commands: Follows one step commands inconsistently Safety/Judgement: Decreased awareness of safety, Decreased awareness of deficits Awareness: Intellectual, Emergent, Anticipatory Problem Solving: Slow processing, Decreased initiation, Requires tactile cues, Requires verbal cues       General Comments  Pt on 2L resting in bed with spO2 from 98% to 100%, tolerates room air for rest and activity, sustaining sats >/= 90%.    Exercises Other Exercises Other Exercises: OT ed re: role of OT, importance of OOB activity   Shoulder Instructions      Home Living Family/patient expects to be discharged to:: Private residence Living Arrangements: Spouse/significant other Available Help at Discharge: Personal care attendant Type of Home: House Home Access: Ramped entrance     Home Layout: One level                          Prior Functioning/Environment               Mobility Comments: Pt unreliable historian. Per chart, pt lives in a 1 level home with ramped entrance where he is the caregiver for his wife (pt confirms this) but she also has an aide that assists him PRN. Pt reports he ambulates without AD but question accuracy.          OT Problem List: Decreased strength;Decreased activity tolerance;Impaired  balance (sitting and/or standing)      OT Treatment/Interventions: Self-care/ADL training;Therapeutic exercise;DME and/or AE instruction;Therapeutic activities;Patient/family education;Balance training    OT Goals(Current goals can be found in the care plan section) Acute Rehab OT Goals Patient Stated Goal: none stated OT Goal Formulation: All assessment and education complete, DC therapy Time For Goal Achievement: 07/09/21 Potential to Achieve Goals: Fair ADL Goals Pt  Will Perform Eating: with set-up;with adaptive utensils;sitting Pt Will Transfer to Toilet: with min assist;ambulating;bedside commode (~10' w/ LRAD) Pt Will Perform Toileting - Clothing Manipulation and hygiene: with min assist;sitting/lateral leans Pt/caregiver will Perform Home Exercise Program: Increased strength;Both right and left upper extremity;With minimal assist  OT Frequency: Min 2X/week    Co-evaluation   Reason for Co-Treatment: Complexity of the patient's impairments (multi-system involvement);Necessary to address cognition/behavior during functional activity;For patient/therapist safety PT goals addressed during session: Mobility/safety with mobility;Balance        AM-PAC OT "6 Clicks" Daily Activity     Outcome Measure Help from another person eating meals?: A Little Help from another person taking care of personal grooming?: A Little Help from another person toileting, which includes using toliet, bedpan, or urinal?: A Lot Help from another person bathing (including washing, rinsing, drying)?: A Lot Help from another person to put on and taking off regular upper body clothing?: A Little Help from another person to put on and taking off regular lower body clothing?: A Lot 6 Click Score: 15   End of Session Equipment Utilized During Treatment: Gait belt;Rolling walker (2 wheels) Nurse Communication: Mobility status;Other (comment) (self-feeding status)  Activity Tolerance: Patient tolerated treatment well Patient left: in chair;with call bell/phone within reach  OT Visit Diagnosis: Muscle weakness (generalized) (M62.81);Unsteadiness on feet (R26.81);Other symptoms and signs involving the nervous system (R29.898);Other symptoms and signs involving cognitive function                Time: 1140-1203 OT Time Calculation (min): 23 min Charges:  OT General Charges $OT Visit: 1 Visit OT Evaluation $OT Eval Moderate Complexity: [place], MS, OTR/L ascom  [phone] 06/25/21, 2:35 PM

## 2021-06-25 NOTE — Consult Note (Signed)
[facility] CONSULT NOTE    Date: 06/25/2021                  Patient Name:  Phillip Reyes  MRN: [id number]  DOB: [birth year]  Age / Sex: 81 y.o., male         PCP: [facility]                 Service Requesting Consult: Critical care                 Reason for Consult: Chronic kidney disease stage IV.               History of Present Illness: Patient is a 81 y.o. male with a PMHx of diabetes mellitus type 2, hypertension, CVA, peripheral vascular disease, history of chronic kidney disease stage IV, osteoarthritis, prostate cancer, anemia of chronic kidney disease, who was admitted to [facility] on 06/22/2021 for evaluation of increasing shortness of breath.  Patient unable to provide much history due to shortness of breath upon evaluation.  He had recent hospitalization at the [place] at which point time he presented with acute respiratory failure in the setting of CHF.  It appears that he left AGAINST MEDICAL ADVICE on 06/21/2021.  When EMS was called he was placed upon BiPAP.  Upon my evaluation he was on a nonrebreather mask.  We are asked to see him for evaluation management of known chronic kidney disease.  His baseline creatinine appears to be 3.6. Creatinine is up a bit to 4.2 with an EGFR of 14.  BUN currently 86.  He has associated anemia of chronic kidney disease with a hemoglobin of 9.5.   Medications: Outpatient medications: Medications Prior to Admission  Medication Sig Dispense Refill Last Dose   amLODipine (NORVASC) 10 MG tablet Take 1 tablet (10 mg total) by mouth daily. 30 tablet 0 06/22/2021 at 1200   atorvastatin (LIPITOR) 80 MG tablet Take 80 mg by mouth at bedtime.   06/21/2021 at 2000   budesonide-formoterol (SYMBICORT) 160-4.5 MCG/ACT inhaler Inhale 2 puffs into the lungs 2 (two) times daily.   06/22/2021 at 0800   Cholecalciferol (VITAMIN D) 50 MCG (2000 UT) CAPS Take 1 capsule by mouth daily.   06/22/2021 at 0800   citalopram (CELEXA) 20  MG tablet Take 20 mg by mouth daily.   06/22/2021 at 0800   gabapentin (NEURONTIN) 300 MG capsule Take 300 mg by mouth daily.    06/22/2021 at 0800   insulin glargine (LANTUS) 100 UNIT/ML injection Inject 10 Units into the skin at bedtime.    06/21/2021 at 2000   insulin regular (NOVOLIN R) 100 units/mL injection Inject 3-10 Units into the skin 3 (three) times daily before meals.   06/22/2021   lisinopril (ZESTRIL) 40 MG tablet Take 20 mg by mouth every morning.   06/22/2021 at 0800   omeprazole (PRILOSEC) 20 MG capsule Take 20 mg by mouth daily.   06/22/2021 at 0800   tamsulosin (FLOMAX) 0.4 MG CAPS capsule Take 0.8 mg by mouth at bedtime.   06/21/2021 at 2000   traZODone (DESYREL) 50 MG tablet Take 50 mg by mouth at bedtime.   06/21/2021 at 2000   vitamin B-12 (CYANOCOBALAMIN) 1000 MCG tablet Take 1,000 mcg by mouth daily.   06/22/2021 at 0800   acetaminophen (TYLENOL) 325 MG tablet Take 650 mg by mouth every 6 (six) hours as needed.   prn at unknown   aspirin EC 81 MG tablet  Take 81 mg by mouth daily.      azelastine (ASTELIN) 0.1 % nasal spray Place 1 spray into both nostrils 2 (two) times daily as needed for rhinitis.   prn at unknown   carboxymethylcellul-glycerin (OPTIVE) 0.5-0.9 % ophthalmic solution Place 1 drop into both eyes 4 (four) times daily.   prn at unknown   doxazosin (CARDURA) 4 MG tablet Take 1 tablet (4 mg total) by mouth at bedtime. (Patient not taking: Reported on 06/22/2021) 30 tablet 0 Not Taking   guaiFENesin (MUCINEX) 600 MG 12 hr tablet Take 1 tablet (600 mg total) by mouth 2 (two) times daily. (Patient not taking: Reported on 06/22/2021) 10 tablet 0 Not Taking   ipratropium-albuterol (DUONEB) 0.5-2.5 (3) MG/3ML SOLN Take 3 mLs by nebulization every 6 (six) hours as needed.   prn at unknown   Vitamin D, Ergocalciferol, (DRISDOL) 1.25 MG (50000 UNIT) CAPS capsule Take 1 capsule (50,000 Units total) by mouth every 7 (seven) days. (Patient not taking: Reported on 06/22/2021) 5 capsule   Not Taking    Current medications: Current Facility-Administered Medications  Medication Dose Route Frequency Provider Last Rate Last Admin   0.9 %  sodium chloride infusion  250 mL Intravenous Continuous Benita Gutter, RPH       albuterol (PROVENTIL) (2.5 MG/3ML) 0.083% nebulizer solution 2.5 mg  2.5 mg Nebulization Q4H PRN Nelle Don, MD       Ampicillin-Sulbactam (UNASYN) 3 g in sodium chloride 0.9 % 100 mL IVPB  3 g Intravenous Q12H Nelle Don, MD   Stopped at 06/25/21 1240   atorvastatin (LIPITOR) tablet 80 mg  80 mg Per Tube QHS Teressa Lower, NP   80 mg at 06/23/21 2231   budesonide (PULMICORT) nebulizer solution 0.25 mg  0.25 mg Nebulization BID Bennie Pierini, MD   0.25 mg at 06/25/21 1115   Chlorhexidine Gluconate Cloth 2 % PADS 6 each  6 each Topical Daily Flora Lipps, MD   6 each at 06/25/21 1500   docusate (COLACE) 50 MG/5ML liquid 100 mg  100 mg Per Tube BID PRN Benita Gutter, RPH       heparin injection 5,000 Units  5,000 Units Subcutaneous Q8H Teressa Lower, NP   5,000 Units at 06/25/21 1313   insulin aspart (novoLOG) injection 0-15 Units  0-15 Units Subcutaneous TID WC Bennie Pierini, MD   3 Units at 06/25/21 1735   insulin glargine-yfgn (SEMGLEE) injection 5 Units  5 Units Subcutaneous Daily Bennie Pierini, MD   5 Units at 06/25/21 1741   ipratropium-albuterol (DUONEB) 0.5-2.5 (3) MG/3ML nebulizer solution 3 mL  3 mL Nebulization TID Nelle Don, MD   3 mL at 06/25/21 1337   lactated ringers bolus 500 mL  500 mL Intravenous Once Bennie Pierini, MD       polyethylene glycol (MIRALAX / GLYCOLAX) packet 17 g  17 g Per Tube Daily PRN Teressa Lower, NP          Allergies: Allergies  Allergen Reactions   Norco [Hydrocodone-Acetaminophen] Shortness Of Breath      Past Medical History: Past Medical History:  Diagnosis Date   Arthritis    Cancer ([place])    COPD (chronic obstructive pulmonary disease) ([place])    Diabetes mellitus without  complication ([place])    Hypertension    Renal disorder    Stroke [facility])      Past Surgical History: Past Surgical History:  Procedure Laterality Date   APPENDECTOMY  THROAT SURGERY       Family History: Family History  Problem Relation Age of Onset   Congestive Heart Failure Father    Other Mother        unknown medical history     Social History: Social History   Socioeconomic History   Marital status: Married    Spouse name: Not on file   Number of children: Not on file   Years of education: Not on file   Highest education level: Not on file  Occupational History   Not on file  Tobacco Use   Smoking status: Every Day    Packs/day: 0.50    Years: 61.00    Pack years: 30.50    Types: Cigarettes   Smokeless tobacco: Never  Vaping Use   Vaping Use: Never used  Substance and Sexual Activity   Alcohol use: No   Drug use: No   Sexual activity: Not on file  Other Topics Concern   Not on file  Social History Narrative   Not on file   Social Determinants of Health   Financial Resource Strain: Not on file  Food Insecurity: Not on file  Transportation Needs: Not on file  Physical Activity: Not on file  Stress: Not on file  Social Connections: Not on file  Intimate Partner Violence: Not on file     Review of Systems: Patient unable to provide review of systems due to shortness of breath  Vital Signs: Blood pressure (!) 115/51, pulse (!) 59, temperature 97.9 F (36.6 C), temperature source Oral, resp. rate 18, height _0  (1.778 m), weight 75 kg, SpO2 98 %.  Weight trends: Filed Weights   06/23/21 0335 06/24/21 0354 06/25/21 0339  Weight: 77.9 kg 77.7 kg 75 kg     Physical Exam: General: Mild respiratory distress  Head: Normocephalic, atraumatic. Moist oral mucosal membranes  Eyes: Anicteric  Neck: Supple  Lungs:  Scattered wheezes bilateral, basilar rales, increased work of breathing  Heart: S1S2 no rubs  Abdomen:  Soft, nontender, bowel  sounds present  Extremities: Trace peripheral edema.  Neurologic: Awake, alert, following commands  Skin: No acute rash  Access: No hemodialysis access    Lab results: Basic Metabolic Panel: Recent Labs  Lab 06/23/21 0315 06/24/21 0409 06/25/21 0456  NA 137 139 139  K 4.3 4.5 5.0  CL 103 105 106  CO2 _1 GLUCOSE 258* 237* 187*  BUN 67* 78* 86*  CREATININE 4.10* 4.14* 4.20*  CALCIUM 8.2* 8.0* 8.1*  MG 2.3 2.3 2.4  PHOS 4.2  --  5.9*    Liver Function Tests: Recent Labs  Lab 06/22/21 1442  AST 13*  ALT 18  ALKPHOS 74  BILITOT 0.7  PROT 5.8*  ALBUMIN 3.3*   No results for input(s): LIPASE, AMYLASE in the last 168 hours. No results for input(s): AMMONIA in the last 168 hours.  CBC: Recent Labs  Lab 06/22/21 1442 06/23/21 0458 06/24/21 0409 06/25/21 0456  WBC 10.5 17.7* 24.2* 18.7*  NEUTROABS 7.4  --   --   --   HGB 9.8* 10.8* 9.7* 9.5*  HCT 30.8* 32.1* 29.6* 29.1*  MCV 97.2 92.5 95.5 95.7  PLT 192 207 199 188    Cardiac Enzymes: No results for input(s): CKTOTAL, CKMB, CKMBINDEX, TROPONINI in the last 168 hours.  BNP: Invalid input(s): POCBNP  CBG: Recent Labs  Lab 06/24/21 2313 06/25/21 0336 06/25/21 0735 06/25/21 1146 06/25/21 1636  GLUCAP 182* 155* 164* 202* 189*  Microbiology: Results for orders placed or performed during the hospital encounter of 06/22/21  Blood culture (routine x 2)     Status: None (Preliminary result)   Collection Time: 06/22/21  2:43 PM   Specimen: BLOOD  Result Value Ref Range Status   Specimen Description BLOOD BLOOD RIGHT HAND  Final   Special Requests   Final    BOTTLES DRAWN AEROBIC AND ANAEROBIC Blood Culture adequate volume   Culture   Final    NO GROWTH 3 DAYS Performed at [facility], [address] [place], [place] [address]    Report Status PENDING  Incomplete  Resp Panel by RT-PCR (Flu A&B, Covid) Nasopharyngeal Swab     Status: None   Collection Time: 06/22/21  3:10 PM    Specimen: Nasopharyngeal Swab; Nasopharyngeal(NP) swabs in vial transport medium  Result Value Ref Range Status   SARS Coronavirus 2 by RT PCR NEGATIVE NEGATIVE Final    Comment: (NOTE) SARS-CoV-2 target nucleic acids are NOT DETECTED.  The SARS-CoV-2 RNA is generally detectable in upper respiratory specimens during the acute phase of infection. The lowest concentration of SARS-CoV-2 viral copies this assay can detect is 138 copies/mL. A negative result does not preclude SARS-Cov-2 infection and should not be used as the sole basis for treatment or other patient management decisions. A negative result may occur with  improper specimen collection/handling, submission of specimen other than nasopharyngeal swab, presence of viral mutation(s) within the areas targeted by this assay, and inadequate number of viral copies(<138 copies/mL). A negative result must be combined with clinical observations, patient history, and epidemiological information. The expected result is Negative.  Fact Sheet for Patients:  EntrepreneurPulse.com.au  Fact Sheet for Healthcare Providers:  IncredibleEmployment.be  This test is no t yet approved or cleared by the [place] FDA and  has been authorized for detection and/or diagnosis of SARS-CoV-2 by FDA under an Emergency Use Authorization (EUA). This EUA will remain  in effect (meaning this test can be used) for the duration of the COVID-19 declaration under Section 564(b)(1) of the Act, 21 U.S.C.section 360bbb-3(b)(1), unless the authorization is terminated  or revoked sooner.       Influenza A by PCR NEGATIVE NEGATIVE Final   Influenza B by PCR NEGATIVE NEGATIVE Final    Comment: (NOTE) The Xpert Xpress SARS-CoV-2/FLU/RSV plus assay is intended as an aid in the diagnosis of influenza from Nasopharyngeal swab specimens and should not be used as a sole basis for treatment. Nasal washings and aspirates are  unacceptable for Xpert Xpress SARS-CoV-2/FLU/RSV testing.  Fact Sheet for Patients: EntrepreneurPulse.com.au  Fact Sheet for Healthcare Providers: IncredibleEmployment.be  This test is not yet approved or cleared by the [place] FDA and has been authorized for detection and/or diagnosis of SARS-CoV-2 by FDA under an Emergency Use Authorization (EUA). This EUA will remain in effect (meaning this test can be used) for the duration of the COVID-19 declaration under Section 564(b)(1) of the Act, 21 U.S.C. section 360bbb-3(b)(1), unless the authorization is terminated or revoked.  Performed at [facility], [address] [place],  [address]   Blood culture (routine x 2)     Status: None (Preliminary result)   Collection Time: 06/22/21  3:45 PM   Specimen: BLOOD  Result Value Ref Range Status   Specimen Description BLOOD LEFT ANTECUBITAL  Final   Special Requests   Final    BOTTLES DRAWN AEROBIC AND ANAEROBIC Blood Culture adequate volume   Culture   Final  NO GROWTH 3 DAYS Performed at [facility], [address] [place], [place] [address]    Report Status PENDING  Incomplete  MRSA Next Gen by PCR, Nasal     Status: None   Collection Time: 06/22/21  5:37 PM   Specimen: Nasal Mucosa; Nasal Swab  Result Value Ref Range Status   MRSA by PCR Next Gen NOT DETECTED NOT DETECTED Final    Comment: (NOTE) The GeneXpert MRSA Assay (FDA approved for NASAL specimens only), is one component of a comprehensive MRSA colonization surveillance program. It is not intended to diagnose MRSA infection nor to guide or monitor treatment for MRSA infections. Test performance is not FDA approved in patients less than 70 years old. Performed at [facility], [address] [place], [place] [address]     Coagulation Studies: No results for input(s): LABPROT, INR in the last 72 hours.  Urinalysis: No results for  input(s): COLORURINE, LABSPEC, PHURINE, GLUCOSEU, HGBUR, BILIRUBINUR, KETONESUR, PROTEINUR, UROBILINOGEN, NITRITE, LEUKOCYTESUR in the last 72 hours.  Invalid input(s): APPERANCEUR    Imaging: No results found.   Assessment & Plan: Pt is a 81 y.o. male with a PMHx of diabetes mellitus type 2, hypertension, CVA, peripheral vascular disease, history of chronic kidney disease stage IV, osteoarthritis, prostate cancer, anemia of chronic kidney disease, who was admitted to [facility] on 06/22/2021 for evaluation of increasing shortness of breath.  1.  Diabetes mellitus type 2 with chronic kidney disease/acute kidney injury/chronic kidney disease stage IV baseline creatinine 3.6.  Suspect acute kidney injury now related to concurrent illness.  EGFR down to 14.  Check renal ultrasound to make sure no underlying obstruction.  No immediate need for dialysis however this may need to be considered if patient and family desire aggressive care.  Urine output over the preceding 24 hours was 1.1 L.  2.  Acute respiratory failure.  Earlier was requiring nonrebreather mask.  Defer decisions regarding respiratory status and intervention to pulmonary/critical care.  3.  Anemia of chronic kidney disease.  Hemoglobin currently 9.5.  Hold off on Epogen for now.  Monitor CBC.

## 2021-06-25 NOTE — Progress Notes (Signed)
NAME:  Phillip Reyes, MRN:  [id number], DOB:  04-07-1941, LOS: 3 ADMISSION DATE:  06/22/2021, CONSULTATION DATE: 06/22/2021 REFERRING MD: Dr. Ellender Hose, CHIEF COMPLAINT: Shortness of Breath   History of Present Illness:  This is an 81 yo male who presented to [facility] ER on 02/15 with shortness of breath.  Per ER notes EMS reported the pt was recently hospitalized at the [facility] for 1 month with acute respiratory failure in the setting of CHF requiring aggressive diuresis, however he signed out AMA on 02/14.  Upon EMS arrival at pts home he was hypoxic in acute respiratory distress.  He was placed on Bipap with initial improvement, however en route to the ER pt became increasingly drowsy.    ED course Upon arrival to the ER pt difficult to arouse with low tidal volumes on Bipap.  Therefore, pt required emergent mechanical intubation.  CXR negative for acute abnormality.  Lab results revealed glucose 295, BUN 63, creatinine 4.10, albumin 3.3, hgb 9.8, and COVID-19/Influenza PCR A&B negative.    Pertinent  Medical History  Arthritis COPD Type II Diabetes Mellitus  HTN Stroke  PVD Cancer  Renal Disorder   Significant Hospital Events: Including procedures, antibiotic start and stop dates in addition to other pertinent events   02/15: Pt admitted to ICU with acute on chronic respiratory failure and acute encephalopathy requiring mechanical intubation  02/17: PT successfully extubated   Interim History / Subjective:  No acute events overnight.  Pt asking for something to drink  Objective   Blood pressure (!) 125/56, pulse (!) 59, temperature 98.4 F (36.9 C), temperature source Oral, resp. rate 18, height 5\' 10"  (1.778 m), weight 75 kg, SpO2 100 %.        Intake/Output Summary (Last 24 hours) at 06/25/2021 1032 Last data filed at 06/25/2021 0800 Gross per 24 hour  Intake 188.87 ml  Output 930 ml  Net -741.13 ml   Filed Weights   06/23/21 0335 06/24/21 0354 06/25/21 0339  Weight: 77.9 kg 77.7  kg 75 kg   PHYSICAL EXAMINATION: General: Elderly male resting in bed, NAD  HEENT: Supple, no JVD, moist mucous membranes  Neuro: Awake/oriented, follows commands, PERRLA  CV: Paced rhythm, no R/G, 2+ radial/1+ distal pulses no edema  Pulm: Clear throughout, even, non labored  GI: Soft, rounded, non distended, +BS x4 Extremities: Normal bulk and tone, no edema    Resolved Hospital Problem list   Mechanical Intubation   Assessment & Plan:   Acute on chronic respiratory failure possibly secondary to aspiration pneumonia although CHF could be a contributing factor  Hx: COPD   CXR clear, negative viral screen - Supplemental O2 for dyspnea and/or hypoxia  - Maintain O2 sats 88% to 92% - Scheduled nebulized steroids and scheduled/prn bronchodilator therapy - Continue unasyn - Trend WBC and monitor fever curve  - Aggressive pulmonary hygiene  - Speech therapy consulted appreciate input   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)  Lab Results  Component Value Date   CREATININE 4.20 (H) 06/25/2021   BUN 86 (H) 06/25/2021   NA 139 06/25/2021   K 5.0 06/25/2021   CL 106 06/25/2021   CO2 24 06/25/2021    Intake/Output Summary (Last 24 hours) at 06/25/2021 1032 Last data filed at 06/25/2021 0800 Gross per 24 hour  Intake 188.87 ml  Output 930 ml  Net -741.13 ml  - Trend BMP  - Replace electrolytes as indicated  - Monitor UOP - Nephrology  consulted appreciate input   Type II diabetes mellitus  - CBG's q4hrs  - SSI   Acute encephalopathy~improving  - Frequent reorientation  - PT/OT consulted   Best Practice (right click and "Reselect all SmartList Selections" daily)   Diet/type: NPO; Speech Therapy consulted  DVT prophylaxis: prophylactic heparin  GI prophylaxis: Not indicated  Lines: N/A Foley: Will discontinue  Code Status:  full code  Pt stable post extubation will transfer to the [facility].  If pt remains stable this afternoon  will transfer service to [facility].   Labs   CBC: Recent Labs  Lab 06/22/21 1442 06/23/21 0458 06/24/21 0409 06/25/21 0456  WBC 10.5 17.7* 24.2* 18.7*  NEUTROABS 7.4  --   --   --   HGB 9.8* 10.8* 9.7* 9.5*  HCT 30.8* 32.1* 29.6* 29.1*  MCV 97.2 92.5 95.5 95.7  PLT 192 207 199 161    Basic Metabolic Panel: Recent Labs  Lab 06/22/21 1442 06/23/21 0315 06/24/21 0409 06/25/21 0456  NA 136 137 139 139  K 4.6 4.3 4.5 5.0  CL 101 103 105 106  CO2 26 23 25 24   GLUCOSE 295* 258* 237* 187*  BUN 63* 67* 78* 86*  CREATININE 4.10* 4.10* 4.14* 4.20*  CALCIUM 7.6* 8.2* 8.0* 8.1*  MG  --  2.3 2.3 2.4  PHOS  --  4.2  --  5.9*   GFR: Estimated Creatinine Clearance: 14.5 mL/min (A) (by C-G formula based on SCr of 4.2 mg/dL (H)). Recent Labs  Lab 06/22/21 1442 06/22/21 1443 06/22/21 1636 06/23/21 0458 06/24/21 0409 06/25/21 0456  WBC 10.5  --   --  17.7* 24.2* 18.7*  LATICACIDVEN  --  1.9 1.9  --   --   --     Liver Function Tests: Recent Labs  Lab 06/22/21 1442  AST 13*  ALT 18  ALKPHOS 74  BILITOT 0.7  PROT 5.8*  ALBUMIN 3.3*   No results for input(s): LIPASE, AMYLASE in the last 168 hours. No results for input(s): AMMONIA in the last 168 hours.  ABG    Component Value Date/Time   PHART 7.49 (H) 06/23/2021 0500   PCO2ART 34 06/23/2021 0500   PO2ART 186 (H) 06/23/2021 0500   HCO3 25.9 06/23/2021 0500   ACIDBASEDEF 1.3 03/09/2016 1004   O2SAT 99.1 06/23/2021 0500      Coagulation Profile: No results for input(s): INR, PROTIME in the last 168 hours.  Cardiac Enzymes: No results for input(s): CKTOTAL, CKMB, CKMBINDEX, TROPONINI in the last 168 hours.  HbA1C: Hemoglobin A1C  Date/Time Value Ref Range Status  02/17/2013 04:17 AM 9.2 (H) 4.2 - 6.3 % Final    Comment:    The American Diabetes Association recommends that a primary goal of therapy should be <7% and that physicians should reevaluate the treatment regimen in patients with HbA1c values consistently  >8%.    Hgb A1c MFr Bld  Date/Time Value Ref Range Status  06/22/2021 04:36 PM 6.7 (H) 4.8 - 5.6 % Final    Comment:    (NOTE) Pre diabetes:          5.7%-6.4%  Diabetes:              >6.4%  Glycemic control for   <7.0% adults with diabetes   04/15/2020 07:18 PM 7.9 (H) 4.8 - 5.6 % Final    Comment:    (NOTE) Pre diabetes:          5.7%-6.4%  Diabetes:              >  6.4%  Glycemic control for   <7.0% adults with diabetes     CBG: Recent Labs  Lab 06/24/21 1607 06/24/21 1914 06/24/21 2313 06/25/21 0336 06/25/21 0735  GLUCAP 199* 210* 182* 155* 164*    Review of Systems:   Gen: Denies fever, chills, weight change, fatigue, night sweats HEENT: Denies blurred vision, double vision, hearing loss, tinnitus, sinus congestion, rhinorrhea, sore throat, neck stiffness, dysphagia PULM: Denies shortness of breath, cough, sputum production, hemoptysis, wheezing CV: Denies chest pain, edema, orthopnea, paroxysmal nocturnal dyspnea, palpitations GI: Denies abdominal pain, nausea, vomiting, diarrhea, hematochezia, melena, constipation, change in bowel habits GU: Denies dysuria, hematuria, polyuria, oliguria, urethral discharge Endocrine: Denies hot or cold intolerance, polyuria, polyphagia or appetite change Derm: Denies rash, dry skin, scaling or peeling skin change Heme: Denies easy bruising, bleeding, bleeding gums Neuro: Denies headache, numbness, weakness, slurred speech, loss of memory or consciousness  Past Medical History:  He,  has a past medical history of Arthritis, Cancer ([place]), COPD (chronic obstructive pulmonary disease) ([place]), Diabetes mellitus without complication ([place]), Hypertension, Renal disorder, and Stroke ([place]).   Surgical History:   Past Surgical History:  Procedure Laterality Date   APPENDECTOMY     THROAT SURGERY       Social History:   reports that he has been smoking cigarettes. He has a 30.50 pack-year smoking history. He has never used  smokeless tobacco. He reports that he does not drink alcohol and does not use drugs.   Family History:  His family history includes Congestive Heart Failure in his father; Other in his mother.   Allergies Allergies  Allergen Reactions   Norco [Hydrocodone-Acetaminophen] Shortness Of Breath     Home Medications  Prior to Admission medications   Medication Sig Start Date End Date Taking? Authorizing Provider  acetaminophen (TYLENOL) 325 MG tablet Take 650 mg by mouth every 6 (six) hours as needed.    [provider]  amLODipine (NORVASC) 10 MG tablet Take 1 tablet (10 mg total) by mouth daily. 11/11/19   Swayze, Ava, DO  aspirin EC 81 MG tablet Take 81 mg by mouth daily.    [provider]  atorvastatin (LIPITOR) 80 MG tablet Take 80 mg by mouth at bedtime.    [provider]  azelastine (ASTELIN) 0.1 % nasal spray Place 1 spray into both nostrils 2 (two) times daily as needed for rhinitis.    [provider]  budesonide-formoterol (SYMBICORT) 160-4.5 MCG/ACT inhaler Inhale 2 puffs into the lungs 2 (two) times daily.    [provider]  carboxymethylcellul-glycerin (OPTIVE) 0.5-0.9 % ophthalmic solution Place 1 drop into both eyes 4 (four) times daily.    [provider]  Cholecalciferol (VITAMIN D) 50 MCG (2000 UT) CAPS Take 1 capsule by mouth daily.    [provider]  citalopram (CELEXA) 20 MG tablet Take 20 mg by mouth daily.    [provider]  doxazosin (CARDURA) 4 MG tablet Take 1 tablet (4 mg total) by mouth at bedtime. 11/11/19   Swayze, Ava, DO  doxycycline (VIBRAMYCIN) 100 MG capsule Take 1 capsule (100 mg total) by mouth 2 (two) times daily. 01/13/20   Rodriguez-Southworth, Sunday Spillers, PA-C  gabapentin (NEURONTIN) 300 MG capsule Take 300 mg by mouth daily.     [provider]  guaiFENesin (MUCINEX) 600 MG 12 hr tablet Take 1 tablet (600 mg total) by mouth 2 (two) times daily. 11/11/19   Swayze, Ava, DO  insulin  glargine (LANTUS) 100 UNIT/ML injection Inject 10  Units into the skin at bedtime.     [provider]  insulin regular (NOVOLIN R) 100 units/mL injection Inject 8 Units into the skin 3 (three) times daily before meals.    [provider]  ipratropium-albuterol (DUONEB) 0.5-2.5 (3) MG/3ML SOLN Take 3 mLs by nebulization every 6 (six) hours as needed.    [provider]  omeprazole (PRILOSEC) 20 MG capsule Take 20 mg by mouth daily.    [provider]  tamsulosin (FLOMAX) 0.4 MG CAPS capsule Take 0.8 mg by mouth at bedtime.    [provider]  traZODone (DESYREL) 50 MG tablet Take 50 mg by mouth at bedtime.    [provider]  vitamin B-12 (CYANOCOBALAMIN) 1000 MCG tablet Take 1,000 mcg by mouth daily.    [provider]  Vitamin D, Ergocalciferol, (DRISDOL) 1.25 MG (50000 UNIT) CAPS capsule Take 1 capsule (50,000 Units total) by mouth every 7 (seven) days. 11/11/19   Karie Kirks, DO      Critical Care Time: 35 minutes   Donell Beers, [place] Pager [phone] (please enter 7 digits) PCCM Consult Pager [phone] (please enter 7 digits)

## 2021-06-26 ENCOUNTER — Inpatient Hospital Stay: Payer: No Typology Code available for payment source

## 2021-06-26 DIAGNOSIS — J9601 Acute respiratory failure with hypoxia: Secondary | ICD-10-CM | POA: Diagnosis not present

## 2021-06-26 LAB — CBC WITH DIFFERENTIAL/PLATELET
Abs Immature Granulocytes: 0.1 10*3/uL — ABNORMAL HIGH (ref 0.00–0.07)
Basophils Absolute: 0 10*3/uL (ref 0.0–0.1)
Basophils Relative: 0 %
Eosinophils Absolute: 0 10*3/uL (ref 0.0–0.5)
Eosinophils Relative: 0 %
HCT: 28 % — ABNORMAL LOW (ref 39.0–52.0)
Hemoglobin: 9 g/dL — ABNORMAL LOW (ref 13.0–17.0)
Immature Granulocytes: 1 %
Lymphocytes Relative: 14 %
Lymphs Abs: 1.9 10*3/uL (ref 0.7–4.0)
MCH: 30.7 pg (ref 26.0–34.0)
MCHC: 32.1 g/dL (ref 30.0–36.0)
MCV: 95.6 fL (ref 80.0–100.0)
Monocytes Absolute: 1.4 10*3/uL — ABNORMAL HIGH (ref 0.1–1.0)
Monocytes Relative: 10 %
Neutro Abs: 10.1 10*3/uL — ABNORMAL HIGH (ref 1.7–7.7)
Neutrophils Relative %: 75 %
Platelets: 183 10*3/uL (ref 150–400)
RBC: 2.93 MIL/uL — ABNORMAL LOW (ref 4.22–5.81)
RDW: 14.6 % (ref 11.5–15.5)
WBC: 13.6 10*3/uL — ABNORMAL HIGH (ref 4.0–10.5)
nRBC: 0 % (ref 0.0–0.2)

## 2021-06-26 LAB — ECHOCARDIOGRAM COMPLETE BUBBLE STUDY
AR max vel: 1 cm2
AV Area VTI: 0.95 cm2
AV Area mean vel: 1.07 cm2
AV Mean grad: 9 mmHg
AV Peak grad: 19.3 mmHg
Ao pk vel: 2.2 m/s
Area-P 1/2: 3.33 cm2
S' Lateral: 4.29 cm

## 2021-06-26 LAB — PHOSPHORUS: Phosphorus: 5.4 mg/dL — ABNORMAL HIGH (ref 2.5–4.6)

## 2021-06-26 LAB — BASIC METABOLIC PANEL
Anion gap: 10 (ref 5–15)
BUN: 89 mg/dL — ABNORMAL HIGH (ref 8–23)
CO2: 25 mmol/L (ref 22–32)
Calcium: 8.1 mg/dL — ABNORMAL LOW (ref 8.9–10.3)
Chloride: 104 mmol/L (ref 98–111)
Creatinine, Ser: 4.15 mg/dL — ABNORMAL HIGH (ref 0.61–1.24)
GFR, Estimated: 14 mL/min — ABNORMAL LOW (ref >60)
Glucose, Bld: 169 mg/dL — ABNORMAL HIGH (ref 70–99)
Potassium: 4.6 mmol/L (ref 3.5–5.1)
Sodium: 139 mmol/L (ref 135–145)

## 2021-06-26 LAB — GLUCOSE, CAPILLARY
Glucose-Capillary: 184 mg/dL — ABNORMAL HIGH (ref 70–99)
Glucose-Capillary: 173 mg/dL — ABNORMAL HIGH (ref 70–99)
Glucose-Capillary: 209 mg/dL — ABNORMAL HIGH (ref 70–99)
Glucose-Capillary: 201 mg/dL — ABNORMAL HIGH (ref 70–99)

## 2021-06-26 LAB — MAGNESIUM: Magnesium: 2.4 mg/dL (ref 1.7–2.4)

## 2021-06-26 MED ORDER — ARFORMOTEROL TARTRATE 15 MCG/2ML IN NEBU
15.0000 ug | INHALATION_SOLUTION | Freq: Two times a day (BID) | RESPIRATORY_TRACT | Status: DC
  Administered 2021-06-26 – 2021-06-29 (×6): 15 ug via RESPIRATORY_TRACT
  Filled 2021-06-26 (×8): qty 2

## 2021-06-26 MED ORDER — REVEFENACIN 175 MCG/3ML IN SOLN
175.0000 ug | Freq: Every day | RESPIRATORY_TRACT | Status: DC
  Administered 2021-06-27 – 2021-06-29 (×3): 175 ug via RESPIRATORY_TRACT
  Filled 2021-06-26 (×5): qty 3

## 2021-06-26 MED ORDER — LACTATED RINGERS IV BOLUS
500.0000 mL | Freq: Once | INTRAVENOUS | Status: AC
  Administered 2021-06-26: 500 mL via INTRAVENOUS

## 2021-06-26 MED ORDER — ASPIRIN EC 81 MG PO TBEC
81.0000 mg | DELAYED_RELEASE_TABLET | Freq: Every day | ORAL | Status: DC
Start: 2021-06-26 — End: 2021-06-29
  Administered 2021-06-27 – 2021-06-29 (×3): 81 mg via ORAL
  Filled 2021-06-26 (×3): qty 1

## 2021-06-26 MED ORDER — CITALOPRAM HYDROBROMIDE 10 MG PO TABS
20.0000 mg | ORAL_TABLET | Freq: Every day | ORAL | Status: DC
  Administered 2021-06-27 – 2021-06-29 (×3): 20 mg via ORAL
  Filled 2021-06-26 (×2): qty 2

## 2021-06-26 NOTE — Progress Notes (Signed)
SLP Cancellation Note  Patient Details Name: Phillip Reyes MRN: [id number] DOB: [birth year]   Cancelled treatment:       Reason Eval/Treat Not Completed: Patient declined  Attempted clinical swallowing re-evaluation. Pt declined despite education stating he would not work with SLP until he saw the doctor. Pt expressed concern for wife at home. RN made aware. Will re-attempt SLP re-evaluation next date, as appropriate.  [name], M.S., [facility] [phone] ([place])  [name] 06/26/2021, 1:26 PM

## 2021-06-26 NOTE — Progress Notes (Addendum)
Called to evaluate patient at bedside. He appeared confused. He was able to verbalize but did not answer questions appropriately. Comprehensive neurologic exam was non-focal. He worked with physical therapy not long prior to me being called to bedside. He has not received any sedating or anticholinergic meds today. I suspect that he has hospital delirium exacerbated by his recent transfer from the ICU to the floor.  Will order STAT CT head to ensure no evidence for stroke. He does not meet criteria for formal Code Stroke given his non-focal neurologic exam. I advised nursing to continue general delirium precautions and alert me if any safety concerns arise. Additionally, I will restart his home Celexa.  Addendum: CT head reviewed. No new abnormal findings.  Bennie Pierini, MD 06/26/21 5:33 PM

## 2021-06-26 NOTE — Progress Notes (Signed)
Physical Therapy Treatment Patient Details Name: Phillip Reyes MRN: [id number] DOB: Sep 09, 1940 Today's Date: 06/26/2021   History of Present Illness Pt is an 81 y/o M admitted on 06/22/21 after presenting to the ED with c/o SOB. Pt required emergent mechanical intubation & was extubated on 06/24/21. Pt is being treated for acute on chronic respiratory failure possibly 2/2 aspriation PNA although CHF could be a contributing factor. Pt also with acute encephalopathy. Of note, pt was recently hospitalized at the [facility] for 1 month with ARF in the setting of CHF requiring aggressive diuresis but signed out [facility] 2/14. PMH: arthritis, COPD, DM2, HTN, stroke, PVD, CA, renal disorder, tobacco use    PT Comments    PT passing by pt's room, observing pt OOB & attempting to pull IV to go to bathroom. PT provided assistance with IV pole & pt able to ambulate from standing EOB>toilet with supervision. Pt with continent void & performs peri hygiene without assistance. 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 Pt stands to ambulate in room again but has posterior LOB requiring min/mod assist & ultimately sits before standing again with min assist. Pt is able to ambulate in room without AD but min assist with pt demonstrating decreased balance & slight lateral staggering but is much improved compared to when PT saw him yesterday. Due to the fact that pt does not have assistance at home, and per chart he is his wife's caregiver, will continue to recommend STR upon d/c to maximize independence & facilitate return to PLOF prior to return home.    Recommendations for follow up therapy are one component of a multi-disciplinary discharge planning process, led by the attending physician.  Recommendations may be updated based on patient status, additional functional criteria and insurance authorization.  Follow Up  Recommendations  Skilled nursing-short term rehab (<3 hours/day)     Assistance Recommended at Discharge Frequent or constant Supervision/Assistance  Patient can return home with the following Assistance with cooking/housework;Direct supervision/assist for financial management;Assist for transportation;Help with stairs or ramp for entrance;Direct supervision/assist for medications management;A little help with bathing/dressing/bathroom;A little help with walking and/or transfers   Equipment Recommendations  Rolling walker (2 wheels)    Recommendations for Other Services       Precautions / Restrictions Precautions Precautions: Fall Restrictions Weight Bearing Restrictions: No     Mobility  Bed Mobility                    Transfers Overall transfer level: Needs assistance   Transfers: Sit to/from Stand Sit to Stand: Supervision, Min assist (cuing to use grab bar in bathroom & able to perform with supervision, requires min assist for sit>stand from recliner)                Ambulation/Gait Ambulation/Gait assistance: Min assist, Supervision (supervision into bathroom at beginning of session, min assist after toileting) Gait Distance (Feet):  (5 ft + 30 ft) Assistive device: None Gait Pattern/deviations: Decreased step length - right, Decreased step length - left, Decreased stride length Gait velocity: decreased         Stairs             Wheelchair Mobility    Modified Rankin (Stroke Patients Only)       Balance Overall balance assessment: Needs assistance Sitting-balance support: Feet supported Sitting balance-Leahy Scale: Good Sitting  balance - Comments: pt able to perform peri hygiene sitting on toilet with supervision without LOB   Standing balance support: No upper extremity supported, During functional activity Standing balance-Leahy Scale: Poor                              Cognition Arousal/Alertness:  Awake/alert Behavior During Therapy: WFL for tasks assessed/performed Overall Cognitive Status: Impaired/Different from baseline Area of Impairment: Orientation, Attention, Following commands, Memory, Awareness, Problem solving, Safety/judgement                 Orientation Level: Disoriented to, Situation, Place (reports "[facility]")   Memory: Decreased recall of precautions, Decreased short-term memory Following Commands: Follows one step commands consistently Safety/Judgement: Decreased awareness of safety, Decreased awareness of deficits Awareness: Anticipatory, Emergent            Exercises      General Comments General comments (skin integrity, edema, etc.): Pt performs toileting without assistance, requires cuing for hand hygiene & safety. PT educates pt on need for assistance with OOB/chair mobility.      Pertinent Vitals/Pain Pain Assessment Pain Assessment: No/denies pain    Home Living                          Prior Function            PT Goals (current goals can now be found in the care plan section) Acute Rehab PT Goals Patient Stated Goal: none stated PT Goal Formulation: With patient Time For Goal Achievement: 07/09/21 Progress towards PT goals: Progressing toward goals    Frequency    Min 2X/week      PT Plan Current plan remains appropriate    Co-evaluation              AM-PAC PT "6 Clicks" Mobility   Outcome Measure  Help needed turning from your back to your side while in a flat bed without using bedrails?: None Help needed moving from lying on your back to sitting on the side of a flat bed without using bedrails?: None Help needed moving to and from a bed to a chair (including a wheelchair)?: A Little Help needed standing up from a chair using your arms (e.g., wheelchair or bedside chair)?: A Little Help needed to walk in hospital room?: A Little Help needed climbing 3-5 steps with a railing? : A Little 6  Click Score: 20    End of Session   Activity Tolerance: Patient tolerated treatment well Patient left: in chair;with chair alarm set;with call bell/phone within reach Nurse Communication: Mobility status PT Visit Diagnosis: Unsteadiness on feet (R26.81);Muscle weakness (generalized) (M62.81);Difficulty in walking, not elsewhere classified (R26.2)     Time: 4196-2229 PT Time Calculation (min) (ACUTE ONLY): 17 min  Charges:  $Therapeutic Activity: 8-22 mins                     Lavone Nian, PT, DPT 06/26/21, 3:15 PM    Waunita Schooner 06/26/2021, 3:13 PM

## 2021-06-26 NOTE — Progress Notes (Signed)
NAME:  Phillip Reyes, MRN:  [id number], DOB:  1940/09/03, LOS: 4 ADMISSION DATE:  06/22/2021, CONSULTATION DATE: 06/22/2021 REFERRING MD: Dr. Ellender Hose, CHIEF COMPLAINT: Shortness of Breath   History of Present Illness:  This is an 81 yo male who presented to [facility] ER on 02/15 with shortness of breath.  Per ER notes EMS reported the pt was recently hospitalized at the [facility] for 1 month with acute respiratory failure in the setting of CHF requiring aggressive diuresis, however he signed out AMA on 02/14.  Upon EMS arrival at pts home he was hypoxic in acute respiratory distress.  He was placed on Bipap with initial improvement, however en route to the ER pt became increasingly drowsy.    ED course Upon arrival to the ER pt difficult to arouse with low tidal volumes on Bipap.  Therefore, pt required emergent mechanical intubation.  CXR negative for acute abnormality.  Lab results revealed glucose 295, BUN 63, creatinine 4.10, albumin 3.3, hgb 9.8, and COVID-19/Influenza PCR A&B negative.    Pertinent  Medical History  Arthritis COPD Type II Diabetes Mellitus  HTN Stroke  PVD Cancer  Renal Disorder   Significant Hospital Events: Including procedures, antibiotic start and stop dates in addition to other pertinent events   02/15: Pt admitted to ICU with acute on chronic respiratory failure and acute encephalopathy requiring mechanical intubation  02/17: PT successfully extubated  Interim History / Subjective:  No events overnight. No new complaints this AM. Seen by SLP yesterday and cleared for dysphagia diet with thickened liquids. On room air this morning. Renal function is stable.  Objective   Blood pressure (!) 145/57, pulse (!) 57, temperature 98.8 F (37.1 C), temperature source Oral, resp. rate 14, height 5\' 10"  (1.778 m), weight 75 kg, SpO2 97 %.        Intake/Output Summary (Last 24 hours) at 06/26/2021 0852 Last data filed at 06/26/2021 0500 Gross per 24 hour  Intake 707.24 ml   Output 650 ml  Net 57.24 ml   Filed Weights   06/23/21 0335 06/24/21 0354 06/25/21 0339  Weight: 77.9 kg 77.7 kg 75 kg   PHYSICAL EXAMINATION: General: Elderly male resting in bed, NAD  HEENT: Supple, no JVD, moist mucous membranes  Neuro: Awake/oriented CV: Paced rhythm, no R/G, 2+ radial/1+ distal pulses no edema  Pulm: Clear throughout, even, non labored  GI: Soft, rounded, non distended, +BS x4   Resolved Hospital Problem list   Mechanical Intubation   Assessment & Plan:   Acute on chronic respiratory failure possibly secondary to aspiration pneumonia although CHF could be a contributing factor  Hx: COPD   CXR clear, negative viral screen - Supplemental O2 for dyspnea and/or hypoxia  - Maintain O2 sats >=88% - Scheduled nebulized steroids and scheduled/prn bronchodilator therapy - Continue unasyn for 5 days, renally dosed - Trend WBC and monitor fever curve  - Aggressive pulmonary hygiene  - Speech therapy consulted appreciate input   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) - Trend BMP  - Replace electrolytes as indicated  - Monitor UOP - Nephrology consulted appreciate input   Type II diabetes mellitus  - CBG's q4hrs  - SSI   Acute encephalopathy, improved - Frequent reorientation  - PT/OT consulted  Best Practice (right click and "Reselect all SmartList Selections" daily)   Diet/type: dysphagia diet (see orders); Speech Therapy consulted  DVT prophylaxis: prophylactic heparin  GI prophylaxis: Not indicated  Lines: N/A Foley: N/A Code  Status:  full code  Labs   CBC: Recent Labs  Lab 06/22/21 1442 06/23/21 0458 06/24/21 0409 06/25/21 0456 06/26/21 0412  WBC 10.5 17.7* 24.2* 18.7* 13.6*  NEUTROABS 7.4  --   --   --  10.1*  HGB 9.8* 10.8* 9.7* 9.5* 9.0*  HCT 30.8* 32.1* 29.6* 29.1* 28.0*  MCV 97.2 92.5 95.5 95.7 95.6  PLT 192 207 199 188 892    Basic Metabolic Panel: Recent Labs  Lab 06/22/21 1442  06/23/21 0315 06/24/21 0409 06/25/21 0456 06/26/21 0412  NA 136 137 139 139 139  K 4.6 4.3 4.5 5.0 4.6  CL 101 103 105 106 104  CO2 26 23 25 24 25   GLUCOSE 295* 258* 237* 187* 169*  BUN 63* 67* 78* 86* 89*  CREATININE 4.10* 4.10* 4.14* 4.20* 4.15*  CALCIUM 7.6* 8.2* 8.0* 8.1* 8.1*  MG  --  2.3 2.3 2.4 2.4  PHOS  --  4.2  --  5.9* 5.4*   GFR: Estimated Creatinine Clearance: 14.7 mL/min (A) (by C-G formula based on SCr of 4.15 mg/dL (H)). Recent Labs  Lab 06/22/21 1443 06/22/21 1636 06/23/21 0458 06/24/21 0409 06/25/21 0456 06/26/21 0412  WBC  --   --  17.7* 24.2* 18.7* 13.6*  LATICACIDVEN 1.9 1.9  --   --   --   --     Liver Function Tests: Recent Labs  Lab 06/22/21 1442  AST 13*  ALT 18  ALKPHOS 74  BILITOT 0.7  PROT 5.8*  ALBUMIN 3.3*   No results for input(s): LIPASE, AMYLASE in the last 168 hours. No results for input(s): AMMONIA in the last 168 hours.  ABG    Component Value Date/Time   PHART 7.49 (H) 06/23/2021 0500   PCO2ART 34 06/23/2021 0500   PO2ART 186 (H) 06/23/2021 0500   HCO3 25.9 06/23/2021 0500   ACIDBASEDEF 1.3 03/09/2016 1004   O2SAT 99.1 06/23/2021 0500      Coagulation Profile: No results for input(s): INR, PROTIME in the last 168 hours.  Cardiac Enzymes: No results for input(s): CKTOTAL, CKMB, CKMBINDEX, TROPONINI in the last 168 hours.  HbA1C: Hemoglobin A1C  Date/Time Value Ref Range Status  02/17/2013 04:17 AM 9.2 (H) 4.2 - 6.3 % Final    Comment:    The American Diabetes Association recommends that a primary goal of therapy should be <7% and that physicians should reevaluate the treatment regimen in patients with HbA1c values consistently >8%.    Hgb A1c MFr Bld  Date/Time Value Ref Range Status  06/22/2021 04:36 PM 6.7 (H) 4.8 - 5.6 % Final    Comment:    (NOTE) Pre diabetes:          5.7%-6.4%  Diabetes:              >6.4%  Glycemic control for   <7.0% adults with diabetes   04/15/2020 07:18 PM 7.9 (H) 4.8 -  5.6 % Final    Comment:    (NOTE) Pre diabetes:          5.7%-6.4%  Diabetes:              >6.4%  Glycemic control for   <7.0% adults with diabetes     CBG: Recent Labs  Lab 06/25/21 1636 06/25/21 1929 06/25/21 2127 06/25/21 2316 06/26/21 0803  GLUCAP 189* 285* 304* 169* 184*    Review of Systems:   Pertinent findings noted above. All other systems reviewed and are negative.  Past Medical History:  He,  has a past medical history of Arthritis, Cancer ([place]), COPD (chronic obstructive pulmonary disease) ([place]), Diabetes mellitus without complication ([place]), Hypertension, Renal disorder, and Stroke ([place]).   Surgical History:   Past Surgical History:  Procedure Laterality Date   APPENDECTOMY     THROAT SURGERY       Social History:   reports that he has been smoking cigarettes. He has a 30.50 pack-year smoking history. He has never used smokeless tobacco. He reports that he does not drink alcohol and does not use drugs.   Family History:  His family history includes Congestive Heart Failure in his father; Other in his mother.   Allergies Allergies  Allergen Reactions   Norco [Hydrocodone-Acetaminophen] Shortness Of Breath     Home Medications  Prior to Admission medications   Medication Sig Start Date End Date Taking? Authorizing Provider  acetaminophen (TYLENOL) 325 MG tablet Take 650 mg by mouth every 6 (six) hours as needed.    [provider]  amLODipine (NORVASC) 10 MG tablet Take 1 tablet (10 mg total) by mouth daily. 11/11/19   Swayze, Ava, DO  aspirin EC 81 MG tablet Take 81 mg by mouth daily.    [provider]  atorvastatin (LIPITOR) 80 MG tablet Take 80 mg by mouth at bedtime.    [provider]  azelastine (ASTELIN) 0.1 % nasal spray Place 1 spray into both nostrils 2 (two) times daily as needed for rhinitis.    [provider]  budesonide-formoterol (SYMBICORT) 160-4.5 MCG/ACT inhaler Inhale 2 puffs into the lungs 2  (two) times daily.    [provider]  carboxymethylcellul-glycerin (OPTIVE) 0.5-0.9 % ophthalmic solution Place 1 drop into both eyes 4 (four) times daily.    [provider]  Cholecalciferol (VITAMIN D) 50 MCG (2000 UT) CAPS Take 1 capsule by mouth daily.    [provider]  citalopram (CELEXA) 20 MG tablet Take 20 mg by mouth daily.    [provider]  doxazosin (CARDURA) 4 MG tablet Take 1 tablet (4 mg total) by mouth at bedtime. 11/11/19   Swayze, Ava, DO  doxycycline (VIBRAMYCIN) 100 MG capsule Take 1 capsule (100 mg total) by mouth 2 (two) times daily. 01/13/20   Rodriguez-Southworth, Sunday Spillers, PA-C  gabapentin (NEURONTIN) 300 MG capsule Take 300 mg by mouth daily.     [provider]  guaiFENesin (MUCINEX) 600 MG 12 hr tablet Take 1 tablet (600 mg total) by mouth 2 (two) times daily. 11/11/19   Swayze, Ava, DO  insulin glargine (LANTUS) 100 UNIT/ML injection Inject 10 Units into the skin at bedtime.     [provider]  insulin regular (NOVOLIN R) 100 units/mL injection Inject 8 Units into the skin 3 (three) times daily before meals.    [provider]  ipratropium-albuterol (DUONEB) 0.5-2.5 (3) MG/3ML SOLN Take 3 mLs by nebulization every 6 (six) hours as needed.    [provider]  omeprazole (PRILOSEC) 20 MG capsule Take 20 mg by mouth daily.    [provider]  tamsulosin (FLOMAX) 0.4 MG CAPS capsule Take 0.8 mg by mouth at bedtime.    [provider]  traZODone (DESYREL) 50 MG tablet Take 50 mg by mouth at bedtime.    [provider]  vitamin B-12 (CYANOCOBALAMIN) 1000 MCG tablet Take 1,000 mcg by mouth daily.    [provider]  Vitamin D, Ergocalciferol, (DRISDOL) 1.25 MG (50000 UNIT) CAPS capsule Take 1 capsule (50,000 Units total) by mouth every  7 (seven) days. 11/11/19   Swayze, Ava, DO     Vanessa Alesi Sharene Butters, MD 06/26/21 8:53 AM    Pulmonary/Critical Care

## 2021-06-26 NOTE — Progress Notes (Signed)
RN responded to pt's bed alarm, and pt was up out of bed mumbling incomprehensible words. RN checked VS and blood glucose. VSS and BS 204. Pt is lethargic and unable to keep his eyes open. RN called Engineer, petroleum to bedside. Rapid response quickly called and MD notified.

## 2021-06-26 NOTE — Progress Notes (Signed)
[place] for Electrolyte Monitoring and Replacement   Recent Labs: Potassium (mmol/L)  Date Value  06/26/2021 4.6  02/17/2013 3.9   Magnesium (mg/dL)  Date Value  06/26/2021 2.4  02/17/2013 1.6 (L)   Calcium (mg/dL)  Date Value  06/26/2021 8.1 (L)   Calcium, Total (mg/dL)  Date Value  02/17/2013 8.0 (L)   Albumin (g/dL)  Date Value  06/22/2021 3.3 (L)  02/16/2013 3.1 (L)   Phosphorus (mg/dL)  Date Value  06/26/2021 5.4 (H)   Sodium (mmol/L)  Date Value  06/26/2021 139  02/17/2013 142   Corrected Ca: 8.76 mg/dL  Assessment: Patient is a 81 y/o M with medical history including T2DM, HTN, COPD, CKD, history of CVA, reported recent hospitalization at the [place] for a month who presented with respiratory distress and altered mental status. Patient required intubation in the emergency department. Disposition is the ICU. Pharmacy consulted to assist with electrolyte monitoring and replacement as indicated.  Goal of Therapy:  Electrolytes within normal limits  Plan:  No electrolyte replacement indicated at this time Will follow-up electrolytes with AM labs tomorrow including magnesium and potassium  Oswald Hillock 06/26/2021 7:29 AM

## 2021-06-26 NOTE — Significant Event (Signed)
Rapid Response Event Note   Reason for Call :  Patient with change in behavior   Initial Focused Assessment:  Patient c/o being dizzy. "I want to see my wife before I die" Mouth open. Eyes looking upward but will directly at you when he is asked.     Interventions: Dr. Jonnie Finner at bedside. Tests ordered.   Plan of Care:  Patient moved to room 159 from 131. He is confused and was found trying to walk out of the room.   Event Summary:   MD Notified: Dr. Jonnie Finner at bedside [address] [place], RN

## 2021-06-26 NOTE — Progress Notes (Deleted)
[place] for Electrolyte Monitoring and Replacement   Recent Labs: Potassium (mmol/L)  Date Value  06/26/2021 4.6  02/17/2013 3.9   Magnesium (mg/dL)  Date Value  06/26/2021 2.4  02/17/2013 1.6 (L)   Calcium (mg/dL)  Date Value  06/26/2021 8.1 (L)   Calcium, Total (mg/dL)  Date Value  02/17/2013 8.0 (L)   Albumin (g/dL)  Date Value  06/22/2021 3.3 (L)  02/16/2013 3.1 (L)   Phosphorus (mg/dL)  Date Value  06/26/2021 5.4 (H)   Sodium (mmol/L)  Date Value  06/26/2021 139  02/17/2013 142   Assessment: Patient is a 81 y/o M with medical history including T2DM, HTN, COPD, CKD, history of CVA, reported recent hospitalization at the [place] for a month who presented with respiratory distress and altered mental status. Patient required intubation in the emergency department. Disposition is the ICU. Pharmacy consulted to assist with electrolyte monitoring and replacement as indicated.  Goal of Therapy:  Electrolytes within normal limits  Plan:  No electrolyte replacement indicated at this time Will follow-up electrolytes with AM labs tomorrow  Benita Gutter 06/26/2021 7:30 AM

## 2021-06-26 NOTE — TOC Progression Note (Signed)
Transition of Care [facility]) - Progression Note    Patient Details  Name: Phillip Reyes MRN: [id number] Date of Birth: 01/12/1941  Transition of Care [facility] CM/SW Contact  [address] [place], [place] Phone Number: 06/26/2021, 3:44 PM  Clinical Narrative:    Attempt made to speak with patient at bedside however patient not oriented. Phone call to spouse, however no answer. Nurse provided this social worker with caregiver's name and number [employer] [phone]. Per Teodoro Spray he is the caregiver for patient and his spouse, however patient's daughter is the POA. Patient's daughter has not been able to be contacted. Plans were made to have POA changed to caregiver however patient was admitted. Caregiver would like to see if patient will return to baseline in order to participate in the decision for rehab stay. Return call to patient's spouse who is agreeable to caregiver Teodoro Spray [phone] participating in care coordination on behalf of patient.  Transition of Care to continue to follow   [facility], LCSW Transition of Care [phone]    Expected Discharge Plan:  (TBD) Barriers to Discharge: Continued Medical Work up  Expected Discharge Plan and Services Expected Discharge Plan:  (TBD)   Discharge Planning Services: CM Consult   Living arrangements for the past 2 months: Single Family Home                 DME Arranged: N/A DME Agency: NA       HH Arranged: NA HH Agency: NA         Social Determinants of Health (SDOH) Interventions    Readmission Risk Interventions No flowsheet data found.

## 2021-06-26 NOTE — Progress Notes (Signed)
Dr. Jonnie Finner arrived at bedside to evaluate patient. This RN explained that pt is acting very different than a couple hours ago when he was last assessed, as well as changes in his speech and clinical presentation. MD states he thinks it is due to confusion from moving from ICU and sleepiness, STAT head CT ordered. Pt moved to room 159 to be more closely monitored. This RN attempted to call pts wife and his caregiver several times each, as pt was requesting to see them. RN was unable to reach them. RN will continue to monitor pt closely and follow orders as necessary.

## 2021-06-27 DIAGNOSIS — J690 Pneumonitis due to inhalation of food and vomit: Secondary | ICD-10-CM | POA: Diagnosis present

## 2021-06-27 DIAGNOSIS — E119 Type 2 diabetes mellitus without complications: Secondary | ICD-10-CM

## 2021-06-27 DIAGNOSIS — N1832 Chronic kidney disease, stage 3b: Secondary | ICD-10-CM

## 2021-06-27 DIAGNOSIS — B952 Enterococcus as the cause of diseases classified elsewhere: Secondary | ICD-10-CM | POA: Diagnosis present

## 2021-06-27 DIAGNOSIS — G9340 Encephalopathy, unspecified: Secondary | ICD-10-CM

## 2021-06-27 DIAGNOSIS — N179 Acute kidney failure, unspecified: Secondary | ICD-10-CM

## 2021-06-27 DIAGNOSIS — Z794 Long term (current) use of insulin: Secondary | ICD-10-CM

## 2021-06-27 DIAGNOSIS — N390 Urinary tract infection, site not specified: Secondary | ICD-10-CM

## 2021-06-27 LAB — CBC
HCT: 27.6 % — ABNORMAL LOW (ref 39.0–52.0)
Hemoglobin: 9.2 g/dL — ABNORMAL LOW (ref 13.0–17.0)
MCH: 31.5 pg (ref 26.0–34.0)
MCHC: 33.3 g/dL (ref 30.0–36.0)
MCV: 94.5 fL (ref 80.0–100.0)
Platelets: 181 10*3/uL (ref 150–400)
RBC: 2.92 MIL/uL — ABNORMAL LOW (ref 4.22–5.81)
RDW: 14.4 % (ref 11.5–15.5)
WBC: 11.1 10*3/uL — ABNORMAL HIGH (ref 4.0–10.5)
nRBC: 0 % (ref 0.0–0.2)

## 2021-06-27 LAB — BASIC METABOLIC PANEL
Anion gap: 8 (ref 5–15)
BUN: 71 mg/dL — ABNORMAL HIGH (ref 8–23)
CO2: 25 mmol/L (ref 22–32)
Calcium: 8.3 mg/dL — ABNORMAL LOW (ref 8.9–10.3)
Chloride: 107 mmol/L (ref 98–111)
Creatinine, Ser: 3.62 mg/dL — ABNORMAL HIGH (ref 0.61–1.24)
GFR, Estimated: 16 mL/min — ABNORMAL LOW (ref >60)
Glucose, Bld: 138 mg/dL — ABNORMAL HIGH (ref 70–99)
Potassium: 4.4 mmol/L (ref 3.5–5.1)
Sodium: 140 mmol/L (ref 135–145)

## 2021-06-27 LAB — URINE CULTURE: Culture: >=100000 — AB

## 2021-06-27 LAB — PHOSPHORUS: Phosphorus: 4.4 mg/dL (ref 2.5–4.6)

## 2021-06-27 LAB — CULTURE, BLOOD (ROUTINE X 2)
Culture: NO GROWTH
Culture: NO GROWTH
Special Requests: ADEQUATE
Special Requests: ADEQUATE

## 2021-06-27 LAB — GLUCOSE, CAPILLARY
Glucose-Capillary: 152 mg/dL — ABNORMAL HIGH (ref 70–99)
Glucose-Capillary: 149 mg/dL — ABNORMAL HIGH (ref 70–99)
Glucose-Capillary: 253 mg/dL — ABNORMAL HIGH (ref 70–99)
Glucose-Capillary: 141 mg/dL — ABNORMAL HIGH (ref 70–99)

## 2021-06-27 LAB — MAGNESIUM: Magnesium: 2.4 mg/dL (ref 1.7–2.4)

## 2021-06-27 MED ORDER — TAMSULOSIN HCL 0.4 MG PO CAPS
0.8000 mg | ORAL_CAPSULE | Freq: Every day | ORAL | Status: DC
  Administered 2021-06-27 – 2021-06-28 (×2): 0.8 mg via ORAL
  Filled 2021-06-27 (×2): qty 2

## 2021-06-27 MED ORDER — VITAMIN B-12 1000 MCG PO TABS
1000.0000 ug | ORAL_TABLET | Freq: Every day | ORAL | Status: DC
  Administered 2021-06-27 – 2021-06-29 (×3): 1000 ug via ORAL
  Filled 2021-06-27 (×3): qty 1

## 2021-06-27 MED ORDER — VITAMIN D 25 MCG (1000 UNIT) PO TABS
2000.0000 [IU] | ORAL_TABLET | Freq: Every day | ORAL | Status: DC
  Administered 2021-06-27 – 2021-06-29 (×3): 2000 [IU] via ORAL
  Filled 2021-06-27 (×3): qty 2

## 2021-06-27 MED ORDER — NEPRO/CARBSTEADY PO LIQD
237.0000 mL | Freq: Three times a day (TID) | ORAL | Status: DC
  Administered 2021-06-29: 237 mL via ORAL

## 2021-06-27 MED ORDER — ADULT MULTIVITAMIN W/MINERALS CH
1.0000 | ORAL_TABLET | Freq: Every day | ORAL | Status: DC
  Administered 2021-06-27 – 2021-06-29 (×3): 1 via ORAL
  Filled 2021-06-27 (×3): qty 1

## 2021-06-27 MED ORDER — AZELASTINE HCL 0.1 % NA SOLN
1.0000 | Freq: Two times a day (BID) | NASAL | Status: DC | PRN
  Filled 2021-06-27: qty 30

## 2021-06-27 MED ORDER — SODIUM CHLORIDE 0.9 % IV SOLN: INTRAVENOUS | Status: AC

## 2021-06-27 MED ORDER — PANTOPRAZOLE SODIUM 40 MG PO TBEC
40.0000 mg | DELAYED_RELEASE_TABLET | Freq: Every day | ORAL | Status: DC
Start: 2021-06-27 — End: 2021-06-29
  Administered 2021-06-27 – 2021-06-29 (×3): 40 mg via ORAL
  Filled 2021-06-27 (×3): qty 1

## 2021-06-27 NOTE — Progress Notes (Signed)
PROGRESS NOTE    Phillip Reyes  IPJ:[id number] DOB: [birth year] DOA: 06/22/2021 PCP: [facility]    Chief Complaint  Patient presents with   Shortness of Breath    Brief Narrative: This is an 81 yo male who presented to [facility] ER on 02/15 with shortness of breath.  Per ER notes EMS reported the pt was recently hospitalized at the [facility] for 1 month with acute respiratory failure in the setting of CHF requiring aggressive diuresis, however he signed out AMA on 02/14.  Upon EMS arrival at pts home he was hypoxic in acute respiratory distress.  He was placed on Bipap with initial improvement, however en route to the ER pt became increasingly drowsy.     ED course Upon arrival to the ER pt difficult to arouse with low tidal volumes on Bipap.  Therefore, pt required emergent mechanical intubation.  CXR negative for acute abnormality.  Lab results revealed glucose 295, BUN 63, creatinine 4.10, albumin 3.3, hgb 9.8, and COVID-19/Influenza PCR A&B negative.    Patient admitted to PCCM, intubated and subsequently extubated on room air.  Patient placed empirically on IV Unasyn for concern for aspiration pneumonia.  Patient seen by speech therapy.  Urine cultures positive for Enterococcus species. Patient transferred to the floor and care transferred to [facility]   Assessment & Plan:   Principal Problem:   Acute respiratory failure ([place]) Active Problems:   Aspiration pneumonia ([place])   Acute encephalopathy   Type 2 diabetes mellitus (HCC)   HTN (hypertension)   COPD (chronic obstructive pulmonary disease) (HCC)   CKD (chronic kidney disease), stage III (HCC)   Malnutrition of moderate degree   Monocytosis   Enterococcus UTI   AKI (acute kidney injury) ([place])  #1 acute on chronic respiratory failure likely secondary to aspiration pneumonia, concerned that CHF may be a contributing factor in the setting of underlying COPD -Patient admitted was dyspneic, drowsy and subsequently  intubated. -Continue scheduled nebulizers steroids, scheduled bronchodilator therapy, pulmonary hygiene. -Patient seen by SLP. -Continue IV Unasyn to complete a 5-day course of treatment. -Keep sats >=88%.  2.  Acute renal failure on chronic kidney disease stage IV -Renal function improving trending down. -Urine output not properly recorded -Creatinine on admission was 4.10 went up as high as 4.20 trending down and currently at 3.62. -Gentle hydration 50 cc an hour normal saline x24 hours. -Nephrology following and appreciate input and recommendations.  3.  Anemia of chronic disease -H&H stable. -Per nephrology.  4.  Enterococcus UTI -On IV Unasyn.  5.  Acute metabolic encephalopathy -Likely secondary to acute illness. -Head CT done x2 negative for any acute abnormalities. -Patient with no focal neurological deficits. -Slowly improving clinically. -Continue empiric IV antibiotics, supportive care.  6.  Diabetes mellitus type 2 -Hemoglobin A1c 6.7 (06/22/2021) -CBG 152 this morning. -Continue Semglee 5 units daily, SSI.  7.  BPH -Resume home regimen Flomax.   DVT prophylaxis: Heparin Code Status: Full Family Communication: No family at bedside Disposition:   Status is: Inpatient Remains inpatient appropriate because: Severity of illness           Consultants:  Admitted to PCCM Nephrology: Dr. Holley Raring 06/25/2021  Procedures:  Intubated 06/22/2021 >>>> extubated 06/24/2021 CT head 06/22/2021, 06/26/2021 Chest x-ray 06/22/2021, 06/23/2021 Renal ultrasound 06/26/2021 Lower extremity Dopplers 06/23/2021 2D echo with bubble study 06/25/2021    Antimicrobials:  IV Unasyn 06/24/2021>>>> 06/29/2021   Subjective: Patient laying in bed.  Alert and oriented to self place and time.  Denies any chest pain.  No shortness of breath.  No abdominal pain.  Poor oral intake noted.  Objective: Vitals:   06/27/21 0556 06/27/21 0756 06/27/21 0820 06/27/21 1623  BP: (!) 147/56   (!) 141/54 (!) 151/70  Pulse: 60  (!) 59 61  Resp: 16  16 16   Temp: 98 F (36.7 C)  98.3 F (36.8 C) 98.2 F (36.8 C)  TempSrc:      SpO2: 97% 98% 98% 100%  Weight:      Height:        Intake/Output Summary (Last 24 hours) at 06/27/2021 1802 Last data filed at 06/27/2021 0517 Gross per 24 hour  Intake --  Output 475 ml  Net -475 ml   Filed Weights   06/23/21 0335 06/24/21 0354 06/25/21 0339  Weight: 77.9 kg 77.7 kg 75 kg    Examination:  General exam: Appears calm and comfortable.  Dry mucous membranes. Respiratory system: Clear to auscultation anterior lung fields.Marland Kitchen Respiratory effort normal. Cardiovascular system: S1 & S2 heard, RRR. No JVD, murmurs, rubs, gallops or clicks. No pedal edema. Gastrointestinal system: Abdomen is nondistended, soft and nontender. No organomegaly or masses felt. Normal bowel sounds heard. Central nervous system: Alert and oriented. No focal neurological deficits. Extremities: Symmetric 5 x 5 power. Skin: No rashes, lesions or ulcers Psychiatry: Judgement and insight appear fair. Mood & affect appropriate.     Data Reviewed: I have personally reviewed following labs and imaging studies  CBC: Recent Labs  Lab 06/22/21 1442 06/23/21 0458 06/24/21 0409 06/25/21 0456 06/26/21 0412 06/27/21 0616  WBC 10.5 17.7* 24.2* 18.7* 13.6* 11.1*  NEUTROABS 7.4  --   --   --  10.1*  --   HGB 9.8* 10.8* 9.7* 9.5* 9.0* 9.2*  HCT 30.8* 32.1* 29.6* 29.1* 28.0* 27.6*  MCV 97.2 92.5 95.5 95.7 95.6 94.5  PLT 192 207 199 188 183 597    Basic Metabolic Panel: Recent Labs  Lab 06/23/21 0315 06/24/21 0409 06/25/21 0456 06/26/21 0412 06/27/21 0616  NA 137 139 139 139 140  K 4.3 4.5 5.0 4.6 4.4  CL 103 105 106 104 107  CO2 23 25 24 25 25   GLUCOSE 258* 237* 187* 169* 138*  BUN 67* 78* 86* 89* 71*  CREATININE 4.10* 4.14* 4.20* 4.15* 3.62*  CALCIUM 8.2* 8.0* 8.1* 8.1* 8.3*  MG 2.3 2.3 2.4 2.4 2.4  PHOS 4.2  --  5.9* 5.4* 4.4    GFR: Estimated  Creatinine Clearance: 16.8 mL/min (A) (by C-G formula based on SCr of 3.62 mg/dL (H)).  Liver Function Tests: Recent Labs  Lab 06/22/21 1442  AST 13*  ALT 18  ALKPHOS 74  BILITOT 0.7  PROT 5.8*  ALBUMIN 3.3*    CBG: Recent Labs  Lab 06/26/21 1633 06/26/21 2052 06/27/21 0828 06/27/21 1259 06/27/21 1733  GLUCAP 209* 201* 152* 149* 253*     Recent Results (from the past 240 hour(s))  Blood culture (routine x 2)     Status: None   Collection Time: 06/22/21  2:43 PM   Specimen: BLOOD  Result Value Ref Range Status   Specimen Description BLOOD BLOOD RIGHT HAND  Final   Special Requests   Final    BOTTLES DRAWN AEROBIC AND ANAEROBIC Blood Culture adequate volume   Culture   Final    NO GROWTH 5 DAYS Performed at [facility], [address] [place], [place] [address]    Report Status 06/27/2021 FINAL  Final  Resp Panel by  RT-PCR (Flu A&B, Covid) Nasopharyngeal Swab     Status: None   Collection Time: 06/22/21  3:10 PM   Specimen: Nasopharyngeal Swab; Nasopharyngeal(NP) swabs in vial transport medium  Result Value Ref Range Status   SARS Coronavirus 2 by RT PCR NEGATIVE NEGATIVE Final    Comment: (NOTE) SARS-CoV-2 target nucleic acids are NOT DETECTED.  The SARS-CoV-2 RNA is generally detectable in upper respiratory specimens during the acute phase of infection. The lowest concentration of SARS-CoV-2 viral copies this assay can detect is 138 copies/mL. A negative result does not preclude SARS-Cov-2 infection and should not be used as the sole basis for treatment or other patient management decisions. A negative result may occur with  improper specimen collection/handling, submission of specimen other than nasopharyngeal swab, presence of viral mutation(s) within the areas targeted by this assay, and inadequate number of viral copies(<138 copies/mL). A negative result must be combined with clinical observations, patient history, and  epidemiological information. The expected result is Negative.  Fact Sheet for Patients:  EntrepreneurPulse.com.au  Fact Sheet for Healthcare Providers:  IncredibleEmployment.be  This test is no t yet approved or cleared by the [place] FDA and  has been authorized for detection and/or diagnosis of SARS-CoV-2 by FDA under an Emergency Use Authorization (EUA). This EUA will remain  in effect (meaning this test can be used) for the duration of the COVID-19 declaration under Section 564(b)(1) of the Act, 21 U.S.C.section 360bbb-3(b)(1), unless the authorization is terminated  or revoked sooner.       Influenza A by PCR NEGATIVE NEGATIVE Final   Influenza B by PCR NEGATIVE NEGATIVE Final    Comment: (NOTE) The Xpert Xpress SARS-CoV-2/FLU/RSV plus assay is intended as an aid in the diagnosis of influenza from Nasopharyngeal swab specimens and should not be used as a sole basis for treatment. Nasal washings and aspirates are unacceptable for Xpert Xpress SARS-CoV-2/FLU/RSV testing.  Fact Sheet for Patients: EntrepreneurPulse.com.au  Fact Sheet for Healthcare Providers: IncredibleEmployment.be  This test is not yet approved or cleared by the [place] FDA and has been authorized for detection and/or diagnosis of SARS-CoV-2 by FDA under an Emergency Use Authorization (EUA). This EUA will remain in effect (meaning this test can be used) for the duration of the COVID-19 declaration under Section 564(b)(1) of the Act, 21 U.S.C. section 360bbb-3(b)(1), unless the authorization is terminated or revoked.  Performed at [facility], [address] [place], [place] [address]   Blood culture (routine x 2)     Status: None   Collection Time: 06/22/21  3:45 PM   Specimen: BLOOD  Result Value Ref Range Status   Specimen Description BLOOD LEFT ANTECUBITAL  Final   Special Requests   Final    BOTTLES  DRAWN AEROBIC AND ANAEROBIC Blood Culture adequate volume   Culture   Final    NO GROWTH 5 DAYS Performed at [facility], [address] [place], [place] [address]    Report Status 06/27/2021 FINAL  Final  MRSA Next Gen by PCR, Nasal     Status: None   Collection Time: 06/22/21  5:37 PM   Specimen: Nasal Mucosa; Nasal Swab  Result Value Ref Range Status   MRSA by PCR Next Gen NOT DETECTED NOT DETECTED Final    Comment: (NOTE) The GeneXpert MRSA Assay (FDA approved for NASAL specimens only), is one component of a comprehensive MRSA colonization surveillance program. It is not intended to diagnose MRSA infection nor to guide or monitor treatment for  MRSA infections. Test performance is not FDA approved in patients less than 14 years old. Performed at [facility], [address] [place], [place] [address]   Urine Culture     Status: Abnormal   Collection Time: 06/24/21 12:14 PM   Specimen: Urine, Random  Result Value Ref Range Status   Specimen Description   Final    URINE, RANDOM Performed at [facility], [address], [place], [place] [address]    Special Requests   Final    NONE Performed at [facility], [address] [place], [place] [address]    Culture >=100,000 COLONIES/mL ENTEROCOCCUS FAECALIS (A)  Final   Report Status 06/27/2021 FINAL  Final   Organism ID, Bacteria ENTEROCOCCUS FAECALIS (A)  Final      Susceptibility   Enterococcus faecalis - MIC*    AMPICILLIN <=2 SENSITIVE Sensitive     NITROFURANTOIN <=16 SENSITIVE Sensitive     VANCOMYCIN 1 SENSITIVE Sensitive     * >=100,000 COLONIES/mL ENTEROCOCCUS FAECALIS         Radiology Studies: CT HEAD WO CONTRAST (5MM)  Result Date: 06/26/2021 CLINICAL DATA:  Mental status change, unknown cause. EXAM: CT HEAD WITHOUT CONTRAST TECHNIQUE: Contiguous axial images were obtained from the base of the skull through the vertex without intravenous contrast. RADIATION DOSE  REDUCTION: This exam was performed according to the departmental dose-optimization program which includes automated exposure control, adjustment of the mA and/or kV according to patient size and/or use of iterative reconstruction technique. COMPARISON:  06/22/2021 FINDINGS: Brain: Chronic small-vessel ischemic changes affect the pons. Cerebellar atrophy without focal insult. Cerebral hemispheres show generalized age related atrophy without lobar predominance. Chronic small-vessel changes of the white matter. No sign of acute infarction, mass lesion, hemorrhage, hydrocephalus or extra-axial collection. Vascular: There is atherosclerotic calcification of the major vessels at the base of the brain. Skull: Negative Sinuses/Orbits: Clear except for mucosal thickening of the sphenoid sinus. Orbits negative. Other: None IMPRESSION: No acute intracranial finding. Atrophy and chronic small-vessel ischemic changes. Sphenoid sinus inflammatory changes. Electronically Signed   By: Nelson Chimes M.D.   On: 06/26/2021 17:37   US RENAL  Result Date: 06/26/2021 CLINICAL DATA:  80 year old male with acute renal insufficiency. EXAM: RENAL / URINARY TRACT ULTRASOUND COMPLETE COMPARISON:  Renal ultrasound 01/28/2019. CT Abdomen and Pelvis 11/23/2015. FINDINGS: Right Kidney: Renal measurements: 9.9 x 4.9 x 4.2 cm = volume: 108 mL. Some renal cortical volume loss since 2020 (image 6). Echogenicity within normal limits. No mass or hydronephrosis visualized. Left Kidney: Renal measurements: 10.0 x 3.8 x 4.1 cm = volume: 81.6 mL. Heterogeneous left renal cortical echogenicity similar to that in 2020. Some renal cortical volume loss since that time. No left hydronephrosis or renal mass. Bladder: Diminutive.  Appears normal for degree of bladder distention. Other: None. IMPRESSION: 1. No acute renal finding. 2. Some bilateral renal cortical atrophy since the 2020 ultrasound, and some evidence of chronic medical renal disease more  pronounced in the left kidney. Electronically Signed   By: Genevie Ann M.D.   On: 06/26/2021 07:02        Scheduled Meds:  arformoterol  15 mcg Nebulization BID   aspirin EC  81 mg Oral Daily   atorvastatin  80 mg Per Tube QHS   budesonide (PULMICORT) nebulizer solution  0.25 mg Nebulization BID   Chlorhexidine Gluconate Cloth  6 each Topical Daily   cholecalciferol  2,000 Units Oral Daily   citalopram  20 mg Oral Daily  feeding supplement (NEPRO CARB STEADY)  237 mL Oral TID BM   heparin  5,000 Units Subcutaneous Q8H   insulin aspart  0-15 Units Subcutaneous TID AC & HS   insulin glargine-yfgn  5 Units Subcutaneous Daily   multivitamin with minerals  1 tablet Oral Daily   pantoprazole  40 mg Oral Daily   revefenacin  175 mcg Nebulization Daily   tamsulosin  0.8 mg Oral QHS   vitamin B-12  1,000 mcg Oral Daily   Continuous Infusions:  sodium chloride Stopped (06/26/21 0039)   sodium chloride 50 mL/hr at 06/27/21 1604   ampicillin-sulbactam (UNASYN) IV 3 g (06/27/21 1737)     LOS: 5 days    Time spent: 40 minutes    Irine Seal, MD [facility]   To contact the attending provider between 7A-7P or the covering provider during after hours 7P-7A, please log into the web site [email or web address] and access using universal [place] password for that web site. If you do not have the password, please call the hospital operator.  06/27/2021, 6:02 PM

## 2021-06-27 NOTE — Progress Notes (Signed)
Nutrition Follow-up  DOCUMENTATION CODES:   Non-severe (moderate) malnutrition in context of chronic illness  INTERVENTION:   -MVI with minerals dai;y -Nepro Shake po TID with meals, each supplement provides 425 kcal and 19 grams protein  -Feeding assistance with meals  NUTRITION DIAGNOSIS:   Moderate Malnutrition related to chronic illness (COPD) as evidenced by mild fat depletion, moderate muscle depletion.  Ongoing  GOAL:   Patient will meet greater than or equal to 90% of their needs  Progressing   MONITOR:   PO intake, Supplement acceptance, Diet advancement, Labs, Weight trends, Skin, I & O's  REASON FOR ASSESSMENT:   Ventilator    ASSESSMENT:   81 y/o male with h/o DM, HTN, COPD, stroke, CHF, CKD III, pacemaker and PVD who is admitted with respiratory failure.  2/18- s/p BSE- advanced to dysphagia 1 diet with nectar thick liquids  Reviewed I/O's: -475 ml x 24 hours and -1.2 L since admission  UOP: 475 ml x 24 hours  Pt unavailable at time of visit. Attempted to speak with pt via call to hospital room phone, however, unable to reach.  Pt currently on a dysphagia 1 diet with nectar thick liquids. No meal completions available at this time.   Per nephrology notes, kidney function improving and no needs for HD at this time.   Per TOC notes, plan for SNF placement at discharge.   Medications reviewed and include vitamin D3 and vitamin B-12.   Labs reviewed: CBGS: 149-209 (inpatient orders for glycemic control are 0-15 units insulin aspart TID before meals and at bedtime and 5 units insulin glargine-yfgn daily).    Diet Order:   Diet Order             DIET - DYS 1 Room service appropriate? Yes; Fluid consistency: Thin  Diet effective now                   EDUCATION NEEDS:   No education needs have been identified at this time  Skin:  Skin Assessment: Reviewed RN Assessment  Last BM:  06/27/21  Height:   Ht Readings from Last 1 Encounters:   06/24/21 5\' 10"  (1.778 m)    Weight:   Wt Readings from Last 1 Encounters:  06/25/21 75 kg    Ideal Body Weight:  75.45 kg  BMI:  Body mass index is 23.72 kg/m.  Estimated Nutritional Needs:   Kcal:  1950-2150  Protein:  100-115 grams  Fluid:  > 1.9 L    Loistine Chance, RD, LDN, [place] Registered Dietitian II Certified Diabetes Care and Education Specialist Please refer to [facility] for RD and/or RD on-call/weekend/after hours pager

## 2021-06-27 NOTE — Progress Notes (Signed)
[facility] for Electrolyte Monitoring and Replacement   Recent Labs: Potassium (mmol/L)  Date Value  06/27/2021 4.4  02/17/2013 3.9   Magnesium (mg/dL)  Date Value  06/27/2021 2.4  02/17/2013 1.6 (L)   Calcium (mg/dL)  Date Value  06/27/2021 8.3 (L)   Calcium, Total (mg/dL)  Date Value  02/17/2013 8.0 (L)   Albumin (g/dL)  Date Value  06/22/2021 3.3 (L)  02/16/2013 3.1 (L)   Phosphorus (mg/dL)  Date Value  06/27/2021 4.4   Sodium (mmol/L)  Date Value  06/27/2021 140  02/17/2013 142   Corrected Ca: 8.76 mg/dL  Assessment: Patient is a 81 y/o M with medical history including T2DM, HTN, COPD, CKD, history of CVA, reported recent hospitalization at the [place] for a month who presented with respiratory distress and altered mental status. Patient required intubation in the emergency department. Disposition is the ICU. Pharmacy consulted to assist with electrolyte monitoring and replacement as indicated.  Goal of Therapy:  Electrolytes within normal limits  Plan:  No electrolyte replacement indicated at this time Will sign off and monitor peripherally  Darrick Penna 06/27/2021 7:34 AM

## 2021-06-27 NOTE — Progress Notes (Signed)
[facility]  ROUNDING NOTE   Subjective:   Patient seen resting quietly, alert and oriented to self Currently receiving swallow evaluation No family at bedside Delayed auditory response, no complaints at this time  Creatinine improved to 3.6  Objective:  Vital signs in last 24 hours:  Temp:  [98 F (36.7 C)-98.7 F (37.1 C)] 98.3 F (36.8 C) (02/20 0820) Pulse Rate:  [59-60] 59 (02/20 0820) Resp:  [16-20] 16 (02/20 0820) BP: (141-153)/(54-67) 141/54 (02/20 0820) SpO2:  [96 %-100 %] 98 % (02/20 0820)  Weight change:  Filed Weights   06/23/21 0335 06/24/21 0354 06/25/21 0339  Weight: 77.9 kg 77.7 kg 75 kg    Intake/Output: I/O last 3 completed shifts: In: 607.2 [I.V.:0.1; IV Piggyback:607.2] Out: 925 [Urine:925]   Intake/Output this shift:  No intake/output data recorded.  Physical Exam: General: NAD  Head: Normocephalic, atraumatic. Moist oral mucosal membranes  Eyes: Anicteric  Lungs:  Clear to auscultation, normal effort  Heart: Regular rate and rhythm  Abdomen:  Soft, nontender  Extremities:  trace peripheral edema.  Neurologic: Nonfocal, moving all four extremities  Skin: No lesions       Basic Metabolic Panel: Recent Labs  Lab 06/23/21 0315 06/24/21 0409 06/25/21 0456 06/26/21 0412 06/27/21 0616  NA 137 139 139 139 140  K 4.3 4.5 5.0 4.6 4.4  CL 103 105 106 104 107  CO2 23 25 24 25 25   GLUCOSE 258* 237* 187* 169* 138*  BUN 67* 78* 86* 89* 71*  CREATININE 4.10* 4.14* 4.20* 4.15* 3.62*  CALCIUM 8.2* 8.0* 8.1* 8.1* 8.3*  MG 2.3 2.3 2.4 2.4 2.4  PHOS 4.2  --  5.9* 5.4* 4.4    Liver Function Tests: Recent Labs  Lab 06/22/21 1442  AST 13*  ALT 18  ALKPHOS 74  BILITOT 0.7  PROT 5.8*  ALBUMIN 3.3*   No results for input(s): LIPASE, AMYLASE in the last 168 hours. No results for input(s): AMMONIA in the last 168 hours.  CBC: Recent Labs  Lab 06/22/21 1442 06/23/21 0458 06/24/21 0409 06/25/21 0456 06/26/21 0412  06/27/21 0616  WBC 10.5 17.7* 24.2* 18.7* 13.6* 11.1*  NEUTROABS 7.4  --   --   --  10.1*  --   HGB 9.8* 10.8* 9.7* 9.5* 9.0* 9.2*  HCT 30.8* 32.1* 29.6* 29.1* 28.0* 27.6*  MCV 97.2 92.5 95.5 95.7 95.6 94.5  PLT 192 207 199 188 183 181    Cardiac Enzymes: No results for input(s): CKTOTAL, CKMB, CKMBINDEX, TROPONINI in the last 168 hours.  BNP: Invalid input(s): POCBNP  CBG: Recent Labs  Lab 06/26/21 1326 06/26/21 1633 06/26/21 2052 06/27/21 0828 06/27/21 1259  GLUCAP 173* 209* 201* 152* 149*    Microbiology: Results for orders placed or performed during the hospital encounter of 06/22/21  Blood culture (routine x 2)     Status: None   Collection Time: 06/22/21  2:43 PM   Specimen: BLOOD  Result Value Ref Range Status   Specimen Description BLOOD BLOOD RIGHT HAND  Final   Special Requests   Final    BOTTLES DRAWN AEROBIC AND ANAEROBIC Blood Culture adequate volume   Culture   Final    NO GROWTH 5 DAYS Performed at [facility], [address] [place], [place] [address]    Report Status 06/27/2021 FINAL  Final  Resp Panel by RT-PCR (Flu A&B, Covid) Nasopharyngeal Swab     Status: None   Collection Time: 06/22/21  3:10 PM   Specimen: Nasopharyngeal  Swab; Nasopharyngeal(NP) swabs in vial transport medium  Result Value Ref Range Status   SARS Coronavirus 2 by RT PCR NEGATIVE NEGATIVE Final    Comment: (NOTE) SARS-CoV-2 target nucleic acids are NOT DETECTED.  The SARS-CoV-2 RNA is generally detectable in upper respiratory specimens during the acute phase of infection. The lowest concentration of SARS-CoV-2 viral copies this assay can detect is 138 copies/mL. A negative result does not preclude SARS-Cov-2 infection and should not be used as the sole basis for treatment or other patient management decisions. A negative result may occur with  improper specimen collection/handling, submission of specimen other than nasopharyngeal swab, presence of viral  mutation(s) within the areas targeted by this assay, and inadequate number of viral copies(<138 copies/mL). A negative result must be combined with clinical observations, patient history, and epidemiological information. The expected result is Negative.  Fact Sheet for Patients:  EntrepreneurPulse.com.au  Fact Sheet for Healthcare Providers:  IncredibleEmployment.be  This test is no t yet approved or cleared by the [place] FDA and  has been authorized for detection and/or diagnosis of SARS-CoV-2 by FDA under an Emergency Use Authorization (EUA). This EUA will remain  in effect (meaning this test can be used) for the duration of the COVID-19 declaration under Section 564(b)(1) of the Act, 21 U.S.C.section 360bbb-3(b)(1), unless the authorization is terminated  or revoked sooner.       Influenza A by PCR NEGATIVE NEGATIVE Final   Influenza B by PCR NEGATIVE NEGATIVE Final    Comment: (NOTE) The Xpert Xpress SARS-CoV-2/FLU/RSV plus assay is intended as an aid in the diagnosis of influenza from Nasopharyngeal swab specimens and should not be used as a sole basis for treatment. Nasal washings and aspirates are unacceptable for Xpert Xpress SARS-CoV-2/FLU/RSV testing.  Fact Sheet for Patients: EntrepreneurPulse.com.au  Fact Sheet for Healthcare Providers: IncredibleEmployment.be  This test is not yet approved or cleared by the [place] FDA and has been authorized for detection and/or diagnosis of SARS-CoV-2 by FDA under an Emergency Use Authorization (EUA). This EUA will remain in effect (meaning this test can be used) for the duration of the COVID-19 declaration under Section 564(b)(1) of the Act, 21 U.S.C. section 360bbb-3(b)(1), unless the authorization is terminated or revoked.  Performed at [facility], [address] [place], [place] [address]   Blood culture (routine x 2)      Status: None   Collection Time: 06/22/21  3:45 PM   Specimen: BLOOD  Result Value Ref Range Status   Specimen Description BLOOD LEFT ANTECUBITAL  Final   Special Requests   Final    BOTTLES DRAWN AEROBIC AND ANAEROBIC Blood Culture adequate volume   Culture   Final    NO GROWTH 5 DAYS Performed at [facility], [address], [place], [place] [address]    Report Status 06/27/2021 FINAL  Final  MRSA Next Gen by PCR, Nasal     Status: None   Collection Time: 06/22/21  5:37 PM   Specimen: Nasal Mucosa; Nasal Swab  Result Value Ref Range Status   MRSA by PCR Next Gen NOT DETECTED NOT DETECTED Final    Comment: (NOTE) The GeneXpert MRSA Assay (FDA approved for NASAL specimens only), is one component of a comprehensive MRSA colonization surveillance program. It is not intended to diagnose MRSA infection nor to guide or monitor treatment for MRSA infections. Test performance is not FDA approved in patients less than 63 years old. Performed at [facility], [address],  [place], [place] [address]   Urine Culture     Status: Abnormal   Collection Time: 06/24/21 12:14 PM   Specimen: Urine, Random  Result Value Ref Range Status   Specimen Description   Final    URINE, RANDOM Performed at [facility], [address], [place], [place] [address]    Special Requests   Final    NONE Performed at [facility], [address] [place], [place] [address]    Culture >=100,000 COLONIES/mL ENTEROCOCCUS FAECALIS (A)  Final   Report Status 06/27/2021 FINAL  Final   Organism ID, Bacteria ENTEROCOCCUS FAECALIS (A)  Final      Susceptibility   Enterococcus faecalis - MIC*    AMPICILLIN <=2 SENSITIVE Sensitive     NITROFURANTOIN <=16 SENSITIVE Sensitive     VANCOMYCIN 1 SENSITIVE Sensitive     * >=100,000 COLONIES/mL ENTEROCOCCUS FAECALIS    Coagulation Studies: No results for input(s): LABPROT, INR in the last 72 hours.  Urinalysis: No results  for input(s): COLORURINE, LABSPEC, PHURINE, GLUCOSEU, HGBUR, BILIRUBINUR, KETONESUR, PROTEINUR, UROBILINOGEN, NITRITE, LEUKOCYTESUR in the last 72 hours.  Invalid input(s): APPERANCEUR    Imaging: CT HEAD WO CONTRAST (5MM)  Result Date: 06/26/2021 CLINICAL DATA:  Mental status change, unknown cause. EXAM: CT HEAD WITHOUT CONTRAST TECHNIQUE: Contiguous axial images were obtained from the base of the skull through the vertex without intravenous contrast. RADIATION DOSE REDUCTION: This exam was performed according to the departmental dose-optimization program which includes automated exposure control, adjustment of the mA and/or kV according to patient size and/or use of iterative reconstruction technique. COMPARISON:  06/22/2021 FINDINGS: Brain: Chronic small-vessel ischemic changes affect the pons. Cerebellar atrophy without focal insult. Cerebral hemispheres show generalized age related atrophy without lobar predominance. Chronic small-vessel changes of the white matter. No sign of acute infarction, mass lesion, hemorrhage, hydrocephalus or extra-axial collection. Vascular: There is atherosclerotic calcification of the major vessels at the base of the brain. Skull: Negative Sinuses/Orbits: Clear except for mucosal thickening of the sphenoid sinus. Orbits negative. Other: None IMPRESSION: No acute intracranial finding. Atrophy and chronic small-vessel ischemic changes. Sphenoid sinus inflammatory changes. Electronically Signed   By: Nelson Chimes M.D.   On: 06/26/2021 17:37   US RENAL  Result Date: 06/26/2021 CLINICAL DATA:  81 year old male with acute renal insufficiency. EXAM: RENAL / URINARY TRACT ULTRASOUND COMPLETE COMPARISON:  Renal ultrasound 01/28/2019. CT Abdomen and Pelvis 11/23/2015. FINDINGS: Right Kidney: Renal measurements: 9.9 x 4.9 x 4.2 cm = volume: 108 mL. Some renal cortical volume loss since 2020 (image 6). Echogenicity within normal limits. No mass or hydronephrosis visualized. Left  Kidney: Renal measurements: 10.0 x 3.8 x 4.1 cm = volume: 81.6 mL. Heterogeneous left renal cortical echogenicity similar to that in 2020. Some renal cortical volume loss since that time. No left hydronephrosis or renal mass. Bladder: Diminutive.  Appears normal for degree of bladder distention. Other: None. IMPRESSION: 1. No acute renal finding. 2. Some bilateral renal cortical atrophy since the 2020 ultrasound, and some evidence of chronic medical renal disease more pronounced in the left kidney. Electronically Signed   By: Genevie Ann M.D.   On: 06/26/2021 07:02   ECHOCARDIOGRAM COMPLETE BUBBLE STUDY  Result Date: 06/26/2021    ECHOCARDIOGRAM REPORT   Patient Name:   Phillip Reyes Date of Exam: 06/25/2021 Medical Rec #:  [id number]     Height:       70.0 in Accession #:    [id number]    Weight:  165.3 lb Date of Birth:  April 01, 1941    BSA:          1.925 m Patient Age:    52 years      BP:           110/48 mmHg Patient Gender: M             HR:           60 bpm. Exam Location:  [facility] Procedure: 2D Echo, Cardiac Doppler, Color Doppler and Saline Contrast Bubble            Study Indications:     Systolic heart failure ([place]) [[id number]]  History:         Patient has prior history of Echocardiogram examinations.                  Stroke; Risk Factors:Hypertension.  Sonographer:     Alyse Low Roar Referring Phys:  [id number] ADAM ROSS [place] Diagnosing Phys: Serafina Royals MD IMPRESSIONS  1. Left ventricular ejection fraction, by estimation, is 40 to 45%. The left ventricle has mildly decreased function. The left ventricle demonstrates global hypokinesis. Left ventricular diastolic parameters were normal.  2. Right ventricular systolic function is normal. The right ventricular size is normal.  3. The mitral valve is normal in structure. Mild mitral valve regurgitation.  4. The aortic valve is normal in structure. Aortic valve regurgitation is trivial. Aortic valve sclerosis is present, with no evidence of aortic valve  stenosis. FINDINGS  Left Ventricle: Left ventricular ejection fraction, by estimation, is 40 to 45%. The left ventricle has mildly decreased function. The left ventricle demonstrates global hypokinesis. The left ventricular internal cavity size was normal in size. There is  no left ventricular hypertrophy. Left ventricular diastolic parameters were normal. Right Ventricle: The right ventricular size is normal. No increase in right ventricular wall thickness. Right ventricular systolic function is normal. Left Atrium: Left atrial size was normal in size. Right Atrium: Right atrial size was normal in size. Pericardium: There is no evidence of pericardial effusion. Mitral Valve: The mitral valve is normal in structure. Mild mitral valve regurgitation. Tricuspid Valve: The tricuspid valve is normal in structure. Tricuspid valve regurgitation is mild. Aortic Valve: The aortic valve is normal in structure. Aortic valve regurgitation is trivial. Aortic valve sclerosis is present, with no evidence of aortic valve stenosis. Aortic valve mean gradient measures 9.0 mmHg. Aortic valve peak gradient measures 19.3 mmHg. Aortic valve area, by VTI measures 0.95 cm. Pulmonic Valve: The pulmonic valve was normal in structure. Pulmonic valve regurgitation is not visualized. Aorta: The aortic root and ascending aorta are structurally normal, with no evidence of dilitation. IAS/Shunts: No atrial level shunt detected by color flow Doppler. Agitated saline contrast was given intravenously to evaluate for intracardiac shunting.  LEFT VENTRICLE PLAX 2D LVIDd:         5.05 cm   Diastology LVIDs:         4.29 cm   LV e' medial:    5.00 cm/s LV PW:         0.97 cm   LV E/e' medial:  16.3 LV IVS:        1.18 cm   LV e' lateral:   7.07 cm/s LVOT diam:     1.90 cm   LV E/e' lateral: 11.5 LV SV:         47 LV SV Index:   24 LVOT Area:     2.84 cm  RIGHT VENTRICLE RV  Mid diam:    2.78 cm RV S prime:     12.80 cm/s TAPSE (M-mode): 2.7 cm LEFT  ATRIUM             Index        RIGHT ATRIUM           Index LA diam:        3.60 cm 1.87 cm/m   RA Area:     15.10 cm LA Vol (A2C):   47.0 ml 24.41 ml/m  RA Volume:   34.40 ml  17.87 ml/m LA Vol (A4C):   40.7 ml 21.14 ml/m LA Biplane Vol: 47.0 ml 24.41 ml/m  AORTIC VALVE                     PULMONIC VALVE AV Area (Vmax):    1.00 cm      PV Vmax:        1.06 m/s AV Area (Vmean):   1.07 cm      PV Peak grad:   4.5 mmHg AV Area (VTI):     0.95 cm      RVOT Peak grad: 2 mmHg AV Vmax:           219.50 cm/s AV Vmean:          134.000 cm/s AV VTI:            0.496 m AV Peak Grad:      19.3 mmHg AV Mean Grad:      9.0 mmHg LVOT Vmax:         77.50 cm/s LVOT Vmean:        50.600 cm/s LVOT VTI:          0.166 m LVOT/AV VTI ratio: 0.33  AORTA Ao Root diam: 2.60 cm Ao Asc diam:  2.80 cm MITRAL VALVE                TRICUSPID VALVE MV Area (PHT): 3.33 cm     TR Peak grad:   31.8 mmHg MV Decel Time: 228 msec     TR Vmax:        282.00 cm/s MV E velocity: 81.50 cm/s MV A velocity: 106.00 cm/s  SHUNTS MV E/A ratio:  0.77         Systemic VTI:  0.17 m MV A Prime:    8.7 cm/s     Systemic Diam: 1.90 cm Serafina Royals MD Electronically signed by Serafina Royals MD Signature Date/Time: 06/26/2021/8:58:05 AM    Final      Medications:    sodium chloride Stopped (06/26/21 0039)   ampicillin-sulbactam (UNASYN) IV 3 g (06/27/21 0517)    arformoterol  15 mcg Nebulization BID   aspirin EC  81 mg Oral Daily   atorvastatin  80 mg Per Tube QHS   budesonide (PULMICORT) nebulizer solution  0.25 mg Nebulization BID   Chlorhexidine Gluconate Cloth  6 each Topical Daily   cholecalciferol  2,000 Units Oral Daily   citalopram  20 mg Oral Daily   heparin  5,000 Units Subcutaneous Q8H   insulin aspart  0-15 Units Subcutaneous TID AC & HS   insulin glargine-yfgn  5 Units Subcutaneous Daily   pantoprazole  40 mg Oral Daily   revefenacin  175 mcg Nebulization Daily   tamsulosin  0.8 mg Oral QHS   vitamin B-12  1,000 mcg Oral  Daily   albuterol, azelastine, docusate, food thickener, polyethylene glycol  Assessment/ Plan:  Mr. Phillip Reyes  is a 81 y.o.  male with a PMHx of diabetes mellitus type 2, hypertension, CVA, peripheral vascular disease, history of chronic kidney disease stage IV, osteoarthritis, prostate cancer, anemia of chronic kidney disease, who was admitted to [facility] on 06/22/2021 for evaluation of increasing shortness of breath.   Diabetes mellitus type 2 with chronic kidney disease/acute kidney injury/chronic kidney disease stage IV baseline creatinine 3.6.  Suspect acute kidney injury now related to concurrent illness.   Creatinine improved to baseline. Glucose stable.     Lab Results  Component Value Date   CREATININE 3.62 (H) 06/27/2021   CREATININE 4.15 (H) 06/26/2021   CREATININE 4.20 (H) 06/25/2021    Intake/Output Summary (Last 24 hours) at 06/27/2021 1401 Last data filed at 06/27/2021 0517 Gross per 24 hour  Intake --  Output 475 ml  Net -475 ml   2.  Acute respiratory failure.  Earlier was requiring nonrebreather mask.  Defer decisions regarding respiratory status and intervention to pulmonary/critical care.  Currently on room air   3.  Anemia of chronic kidney disease.  Hemoglobin 9.2.  Hold off on Epogen for now.  Monitor CBC.      LOS: [place] 2/20/20232:01 PM

## 2021-06-27 NOTE — Progress Notes (Signed)
Occupational Therapy Treatment Patient Details Name: Phillip Reyes MRN: [id number] DOB: 08-27-1940 Today's Date: 06/27/2021   History of present illness Pt is an 81 y/o M admitted on 06/22/21 after presenting to the ED with c/o SOB. Pt required emergent mechanical intubation & was extubated on 06/24/21. Pt is being treated for acute on chronic respiratory failure possibly 2/2 aspriation PNA although CHF could be a contributing factor. Pt also with acute encephalopathy. Of note, pt was recently hospitalized at the [facility] for 1 month with ARF in the setting of CHF requiring aggressive diuresis but signed out [facility] 2/14. PMH: arthritis, COPD, DM2, HTN, stroke, PVD, CA, renal disorder, tobacco use   OT comments  Upon entering the room, pt supine in bed and sleeping soundly. Multiple attempts needed to arouse pt for participate in OT intervention. Warm cloth provided for pt to wash face and alert himself with set up A. Sit >supine with min A to EOB. Pt reports urgency for toileting and stands with mod A and ambulates with min HHA ~ 10' to bathroom without use of AD. Pt seated on toilet and urinates on floor and LE. Assistance needed for hygiene and to change gown and socks. Pt returning to bed in same manner as above. He reports fatigue and requests to return to bed. Min A for sit >supine. All needs within reach and bed alarm activated.  Pt continues to benefit from OT intervention. OT recommends discharge to short term rehab to address functional deficits before returning home.    Recommendations for follow up therapy are one component of a multi-disciplinary discharge planning process, led by the attending physician.  Recommendations may be updated based on patient status, additional functional criteria and insurance authorization.    Follow Up Recommendations  Skilled nursing-short term rehab (<3 hours/day)       Patient can return home with the following  A lot of help with walking and/or transfers;A lot of  help with bathing/dressing/bathroom;Assistance with cooking/housework;Assistance with feeding;Direct supervision/assist for medications management;Direct supervision/assist for financial management;Assist for transportation;Help with stairs or ramp for entrance   Equipment Recommendations  Other (comment) (defer to next level of care)       Precautions / Restrictions Precautions Precautions: Fall       Mobility Bed Mobility Overal bed mobility: Needs Assistance Bed Mobility: Supine to Sit, Sit to Supine     Supine to sit: Mod assist Sit to supine: Mod assist        Transfers Overall transfer level: Needs assistance Equipment used: 1 person hand held assist Transfers: Sit to/from Stand Sit to Stand: Mod assist                 Balance Overall balance assessment: Needs assistance Sitting-balance support: Feet supported Sitting balance-Leahy Scale: Good     Standing balance support: No upper extremity supported, During functional activity Standing balance-Leahy Scale: Poor                             ADL either performed or assessed with clinical judgement   ADL Overall ADL's : Needs assistance/impaired                         Toilet Transfer: Moderate assistance;Regular Toilet;Ambulation   Toileting- Clothing Manipulation and Hygiene: Moderate assistance;Sit to/from stand              Extremity/Trunk Assessment Upper Extremity Assessment Upper Extremity Assessment: Generalized  weakness   Lower Extremity Assessment Lower Extremity Assessment: Generalized weakness        Vision Patient Visual Report: No change from baseline            Cognition Arousal/Alertness: Lethargic Behavior During Therapy: Flat affect Overall Cognitive Status: Impaired/Different from baseline Area of Impairment: Orientation, Attention, Following commands, Memory, Awareness, Problem solving, Safety/judgement                 Orientation  Level: Disoriented to, Situation, Place   Memory: Decreased recall of precautions, Decreased short-term memory Following Commands: Follows one step commands consistently Safety/Judgement: Decreased awareness of safety, Decreased awareness of deficits   Problem Solving: Slow processing, Decreased initiation, Requires tactile cues, Requires verbal cues                     Pertinent Vitals/ Pain       Pain Assessment Pain Assessment: No/denies pain         Frequency  Min 2X/week        Progress Toward Goals  OT Goals(current goals can now be found in the care plan section)  Progress towards OT goals: Progressing toward goals  Acute Rehab OT Goals Patient Stated Goal: to go to bathroom OT Goal Formulation: With patient Time For Goal Achievement: 07/09/21 Potential to Achieve Goals: [place] Discharge plan remains appropriate;Frequency remains appropriate       AM-PAC OT "6 Clicks" Daily Activity     Outcome Measure   Help from another person eating meals?: A Little Help from another person taking care of personal grooming?: A Little Help from another person toileting, which includes using toliet, bedpan, or urinal?: A Lot Help from another person bathing (including washing, rinsing, drying)?: A Lot Help from another person to put on and taking off regular upper body clothing?: A Little Help from another person to put on and taking off regular lower body clothing?: A Lot 6 Click Score: 15    End of Session    OT Visit Diagnosis: Muscle weakness (generalized) (M62.81);Unsteadiness on feet (R26.81);Other symptoms and signs involving the nervous system (R29.898);Other symptoms and signs involving cognitive function   Activity Tolerance Patient limited by fatigue   Patient Left with call bell/phone within reach;in bed;with bed alarm set   Nurse Communication Mobility status        Time: 1341-1404 OT Time Calculation (min): 23 min  Charges: OT General  Charges $OT Visit: 1 Visit OT Treatments $Self Care/Home Management : 23-37 mins  Darleen Crocker, MS, OTR/L , CBIS ascom [phone]  06/27/21, 3:28 PM

## 2021-06-27 NOTE — NC FL2 (Signed)
[place] LEVEL OF CARE SCREENING TOOL     IDENTIFICATION  Patient Name: Phillip Reyes Birthdate: 1941/05/08 Sex: male Admission Date (Current Location): 06/22/2021  [place] and [place] Number:  Engineering geologist and Address:  [facility], [address], [place], [place] [address]      Provider Number: [id number]  Attending Physician Name and Address:  Eugenie Filler, MD  Relative Name and Phone Number:  [name] caregiver [phone]    Current Level of Care: Hospital Recommended Level of Care: [place] Prior Approval Number:    Date Approved/Denied:   PASRR Number: [id number]  Discharge Plan: Home    Current Diagnoses: Patient Active Problem List   Diagnosis Date Noted   Enterococcus UTI 06/27/2021   Malnutrition of moderate degree 06/24/2021   Monocytosis    Acute respiratory failure ([place]) 06/22/2021   COPD with acute exacerbation ([place]) 04/16/2020   CAP (community acquired pneumonia) 01/27/2019   Altered mental status 03/09/2016   Acute encephalopathy 06/11/2015   Type 2 diabetes mellitus ([place]) 06/11/2015   HTN (hypertension) 06/11/2015   COPD (chronic obstructive pulmonary disease) ([place]) 06/11/2015   CKD (chronic kidney disease), stage III ([place]) 06/11/2015   H/O: stroke 06/11/2015    Orientation RESPIRATION BLADDER Height & Weight     Self, Situation  Normal Continent, External catheter Weight: 75 kg Height:  5\' 10"  (177.8 cm)  BEHAVIORAL SYMPTOMS/MOOD NEUROLOGICAL BOWEL NUTRITION STATUS      Continent Diet (Dysphagia 1)  AMBULATORY STATUS COMMUNICATION OF NEEDS Skin   Extensive Assist Verbally Normal                       Personal Care Assistance Level of Assistance  Bathing, Feeding, Dressing Bathing Assistance: Limited assistance Feeding assistance: Limited assistance Dressing Assistance: Maximum assistance     Functional Limitations Info             SPECIAL CARE FACTORS  FREQUENCY  PT (By licensed PT), OT (By licensed OT)     PT Frequency: 5 times per week OT Frequency: 5 times per week            Contractures Contractures Info: Not present    Additional Factors Info  Code Status, Allergies Code Status Info: full code Allergies Info: Norco           Current Medications (06/27/2021):  This is the current hospital active medication list Current Facility-Administered Medications  Medication Dose Route Frequency Provider Last Rate Last Admin   0.9 %  sodium chloride infusion  250 mL Intravenous Continuous Benita Gutter, RPH   Paused at 06/26/21 0039   0.9 %  sodium chloride infusion   Intravenous Continuous Eugenie Filler, MD       albuterol (PROVENTIL) (2.5 MG/3ML) 0.083% nebulizer solution 2.5 mg  2.5 mg Nebulization Q4H PRN Nelle Don, MD       Ampicillin-Sulbactam (UNASYN) 3 g in sodium chloride 0.9 % 100 mL IVPB  3 g Intravenous Q12H Bennie Pierini, MD 200 mL/hr at 06/27/21 0517 3 g at 06/27/21 0517   arformoterol (BROVANA) nebulizer solution 15 mcg  15 mcg Nebulization BID Bennie Pierini, MD   15 mcg at 06/27/21 0756   aspirin EC tablet 81 mg  81 mg Oral Daily Bennie Pierini, MD   81 mg at 06/27/21 1011   atorvastatin (LIPITOR) tablet 80 mg  80 mg Per Tube QHS Teressa Lower, NP   80  mg at 06/26/21 2105   azelastine (ASTELIN) 0.1 % nasal spray 1 spray  1 spray Each Nare BID PRN Eugenie Filler, MD       budesonide (PULMICORT) nebulizer solution 0.25 mg  0.25 mg Nebulization BID Bennie Pierini, MD   0.25 mg at 06/27/21 1117   Chlorhexidine Gluconate Cloth 2 % PADS 6 each  6 each Topical Daily Flora Lipps, MD   6 each at 06/25/21 1500   cholecalciferol (VITAMIN D3) tablet 2,000 Units  2,000 Units Oral Daily Eugenie Filler, MD   2,000 Units at 06/27/21 1011   citalopram (CELEXA) tablet 20 mg  20 mg Oral Daily Bennie Pierini, MD   20 mg at 06/27/21 1000   docusate (COLACE) 50 MG/5ML liquid 100 mg  100 mg Per  Tube BID PRN Benita Gutter, RPH       feeding supplement (NEPRO CARB STEADY) liquid 237 mL  237 mL Oral TID BM Eugenie Filler, MD       food thickener (SIMPLYTHICK (NECTAR/LEVEL 2/MILDLY THICK)) 1 packet  1 packet Oral PRN Bennie Pierini, MD       heparin injection 5,000 Units  5,000 Units Subcutaneous Q8H Teressa Lower, NP   5,000 Units at 06/27/21 1324   insulin aspart (novoLOG) injection 0-15 Units  0-15 Units Subcutaneous TID AC & HS [place], Huel Cote, NP   2 Units at 06/27/21 1324   insulin glargine-yfgn (SEMGLEE) injection 5 Units  5 Units Subcutaneous Daily Bennie Pierini, MD   5 Units at 06/27/21 1113   multivitamin with minerals tablet 1 tablet  1 tablet Oral Daily Eugenie Filler, MD       pantoprazole (PROTONIX) EC tablet 40 mg  40 mg Oral Daily Eugenie Filler, MD   40 mg at 06/27/21 1011   polyethylene glycol (MIRALAX / GLYCOLAX) packet 17 g  17 g Per Tube Daily PRN Teressa Lower, NP       revefenacin (YUPELRI) nebulizer solution 175 mcg  175 mcg Nebulization Daily Bennie Pierini, MD   175 mcg at 06/27/21 0756   tamsulosin (FLOMAX) capsule 0.8 mg  0.8 mg Oral QHS Eugenie Filler, MD       vitamin B-12 (CYANOCOBALAMIN) tablet 1,000 mcg  1,000 mcg Oral Daily Eugenie Filler, MD   1,000 mcg at 06/27/21 1011     Discharge Medications: Please see discharge summary for a list of discharge medications.  Relevant Imaging Results:  Relevant Lab Results:   Additional Information SS# [id number]  Conception Oms, RN

## 2021-06-27 NOTE — Plan of Care (Signed)

## 2021-06-27 NOTE — TOC Progression Note (Signed)
Transition of Care [facility]) - Progression Note    Patient Details  Name: Phillip Reyes MRN: [id number] Date of Birth: May 25, 1940  Transition of Care [facility]) CM/SW [place], RN Phone Number: 06/27/2021, 2:01 PM  Clinical Narrative:   Cristal Generous the patient's wife and her caregiver on the phone, his wife is not able to understand what I was saying, her caregiver said she is not able to answer my questions, I called the patient's daughter [name] she stated that he has had some confusion at home, he does have a 24/7 caregiver, The daughter stated that he has a rolling walker and a cane at home, they would rather go home with home health. The patient's caregiver called and stated that she would like him to go to short term rehab, They do not want him to go to the [place] again, I called Melissa again, I explained to her that the caregiver and the sister is asking that they go to Short term rehab, She is agreeable but would like him closer to [facility] as possible They do not want to go to [place] started    Expected Discharge Plan:  (TBD) Barriers to Discharge: Continued Medical Work up  Expected Discharge Plan and Services Expected Discharge Plan:  (TBD)   Discharge Planning Services: CM Consult   Living arrangements for the past 2 months: Single Family Home                 DME Arranged: N/A DME Agency: NA       HH Arranged: NA HH Agency: NA         Social Determinants of Health (SDOH) Interventions    Readmission Risk Interventions No flowsheet data found.

## 2021-06-27 NOTE — Progress Notes (Signed)
Physical Therapy Treatment Patient Details Name: Phillip Reyes MRN: [id number] DOB: [birth year] Today's Date: 06/27/2021   History of Present Illness Pt is an 81 y/o M admitted on 06/22/21 after presenting to the ED with c/o SOB. Pt required emergent mechanical intubation & was extubated on 06/24/21. Pt is being treated for acute on chronic respiratory failure possibly 2/2 aspriation PNA although CHF could be a contributing factor. Pt also with acute encephalopathy. Of note, pt was recently hospitalized at the [facility] for 1 month with ARF in the setting of CHF requiring aggressive diuresis but signed out [facility] 2/14. PMH: arthritis, COPD, DM2, HTN, stroke, PVD, CA, renal disorder, tobacco use    PT Comments    Pt seen for PT tx with pt received asleep in bed but easily awakened. Pt more calm today with decreased volume of speech but appreciative of PT assistance. Pt is able to completed bed mobility with bed rails & supervision, sit>stand with supervision, and ambulates 2 laps around nurses station with RW & supervision. Pt moves at a significantly decreased speed of movement with pt requiring ~11 minutes to ambulate 2 laps. Continue to recommend STR upon d/c as pt does not have assistance at home, and was the caregiver for his wife & ambulating independently without AD. Pt would benefit from further PT to help facilitate return to PLOF.    Recommendations for follow up therapy are one component of a multi-disciplinary discharge planning process, led by the attending physician.  Recommendations may be updated based on patient status, additional functional criteria and insurance authorization.  Follow Up Recommendations  Skilled nursing-short term rehab (<3 hours/day)     Assistance Recommended at Discharge Frequent or constant Supervision/Assistance  Patient can return home with the following Assistance with cooking/housework;Direct supervision/assist for financial management;Assist for transportation;Help  with stairs or ramp for entrance;Direct supervision/assist for medications management;A little help with bathing/dressing/bathroom;A little help with walking and/or transfers   Equipment Recommendations  Rolling walker (2 wheels)    Recommendations for Other Services       Precautions / Restrictions Precautions Precautions: Fall Restrictions Weight Bearing Restrictions: No     Mobility  Bed Mobility Overal bed mobility: Needs Assistance Bed Mobility: Supine to Sit, Sit to Supine     Supine to sit: Supervision (bed rails, extra time)          Transfers Overall transfer level: Needs assistance Equipment used: Rolling walker (2 wheels) Transfers: Sit to/from Stand Sit to Stand: Supervision                Ambulation/Gait Ambulation/Gait assistance: Supervision Gait Distance (Feet): 310 Feet Assistive device: Rolling walker (2 wheels) Gait Pattern/deviations: Decreased step length - right, Decreased step length - left, Decreased stride length Gait velocity: significantly decreased - pt requires ~11 minutes to ambulate 2 laps around nurses station         Stairs             Wheelchair Mobility    Modified Rankin (Stroke Patients Only)       Balance Overall balance assessment: Needs assistance Sitting-balance support: Feet supported Sitting balance-Leahy Scale: Good Sitting balance - Comments: supervision sitting EOB   Standing balance support: During functional activity, Bilateral upper extremity supported Standing balance-Leahy Scale: Fair                              Cognition Arousal/Alertness: Awake/alert Behavior During Therapy: Flat affect Overall Cognitive Status: Impaired/Different  from baseline Area of Impairment: Orientation, Attention, Following commands, Memory, Awareness, Problem solving, Safety/judgement                 Orientation Level: Person, Place   Memory: Decreased recall of precautions, Decreased  short-term memory Following Commands: Follows one step commands consistently Safety/Judgement: Decreased awareness of safety, Decreased awareness of deficits Awareness: Anticipatory, Emergent Problem Solving: Slow processing, Decreased initiation, Requires tactile cues, Requires verbal cues General Comments: Pt asking to speak to caregiver/for caregiver to come see him in hospital - called caregiver via telephone for pt at end of session.        Exercises      General Comments        Pertinent Vitals/Pain Pain Assessment Pain Assessment: No/denies pain    Home Living                          Prior Function            PT Goals (current goals can now be found in the care plan section) Acute Rehab PT Goals Patient Stated Goal: see his caregiver PT Goal Formulation: With patient Time For Goal Achievement: 07/09/21 Progress towards PT goals: Progressing toward goals    Frequency    Min 2X/week      PT Plan Current plan remains appropriate    Co-evaluation              AM-PAC PT "6 Clicks" Mobility   Outcome Measure  Help needed turning from your back to your side while in a flat bed without using bedrails?: None Help needed moving from lying on your back to sitting on the side of a flat bed without using bedrails?: None Help needed moving to and from a bed to a chair (including a wheelchair)?: A Little Help needed standing up from a chair using your arms (e.g., wheelchair or bedside chair)?: A Little Help needed to walk in hospital room?: A Little Help needed climbing 3-5 steps with a railing? : A Little 6 Click Score: 20    End of Session Equipment Utilized During Treatment: Gait belt Activity Tolerance: Patient tolerated treatment well Patient left: in chair;with chair alarm set;with call bell/phone within reach Nurse Communication: Mobility status PT Visit Diagnosis: Unsteadiness on feet (R26.81);Muscle weakness (generalized)  (M62.81);Difficulty in walking, not elsewhere classified (R26.2)     Time: 8366-2947 PT Time Calculation (min) (ACUTE ONLY): 25 min  Charges:  $Therapeutic Activity: 23-37 mins                     Lavone Nian, PT, DPT 06/27/21, 4:01 PM    Waunita Schooner 06/27/2021, 3:59 PM

## 2021-06-27 NOTE — Progress Notes (Signed)
Speech Language Pathology Treatment:    Patient Details Name: Phillip Reyes MRN: [id number] DOB: 11/22/1940 Today's Date: 06/27/2021 Time: 6244-6950 SLP Time Calculation (min) (ACUTE ONLY): 15 min  Assessment / Plan / Recommendation Clinical Impression  Pt seen for clinical swallowing evaluation. Pt alert, confusion evident. Mildly hoarse vocal quality which improved with PO trials. Cleared with RN.   Pt observed with trials of solid (x1 bite), pureed (x4 bites), and thin liquids (~6 oz via single straw sips). Pt presents with s/sx moderate oral dysphagia including prolonged/inefficient and incomplete mastication of solid which led to eventual expectoration by pt as well as prolonged A-P transit with puree. Oral clearance was obtained with pureed and thin liquids. Oral deficits likely due to dental status as pt edentulous and likely impacted by overall mental status. Pt unable to feed self during today's session despite encouragement and hand-over-hand assistance.   Per chart review, WBC trending downward and temp WNL. No recent chest imaging. Pt on room air.  Recommend diet upgrade to pureed diet with thin liquids with safe swallowing strategies/aspiration precautions as outlined below.   Pt is at increased risk for aspiration/aspiration PNA given dental status, mental status, dependence for feeding at present, and multiple medical comorbidities.   SLP to f/u per POC for diet tolerance and trials of upgraded textures pending improvements in mental status.   At present, anticipate need for post-acute SLP services and frequent/constant supervision at d/c.   Pt and RN made aware of results, recommendations, and SLP POC. Likely limited understanding by pt given AMS.    HPI HPI: This is an 81 yo male who presented to [facility] ER on 02/15 with shortness of breath.  Per ER notes EMS reported the pt was recently hospitalized at the [facility] for 1 month with acute respiratory failure in the setting of CHF  requiring aggressive diuresis, however he signed out AMA on 02/14.  Upon EMS arrival at pts home he was hypoxic in acute respiratory distress.  He was placed on Bipap with initial improvement, however en route to the ER pt became increasingly drowsy requiring emergent intubation on 02/15 thru 02/17.      SLP Plan  Continue with current plan of care      Recommendations for follow up therapy are one component of a multi-disciplinary discharge planning process, led by the attending physician.  Recommendations may be updated based on patient status, additional functional criteria and insurance authorization.    Recommendations  Diet recommendations: Dysphagia 1 (puree);Thin liquid Medication Administration: Crushed with puree Supervision: Staff to assist with self feeding;Full supervision/cueing for compensatory strategies Compensations: Minimize environmental distractions;Slow rate;Small sips/bites Postural Changes and/or Swallow Maneuvers: Seated upright 90 degrees;Upright 30-60 min after meal                Oral Care Recommendations: Oral care BID Follow Up Recommendations: Skilled nursing-short term rehab (<3 hours/day) Assistance recommended at discharge: Frequent or constant Supervision/Assistance SLP Visit Diagnosis: Dysphagia, oral phase (R13.11) Plan: Continue with current plan of care         Phillip Reyes, M.S., [facility] [phone] Phillip Reyes)   Phillip Reyes  06/27/2021, 10:23 AM

## 2021-06-28 DIAGNOSIS — I10 Essential (primary) hypertension: Secondary | ICD-10-CM

## 2021-06-28 LAB — RENAL FUNCTION PANEL
Albumin: 3 g/dL — ABNORMAL LOW (ref 3.5–5.0)
Anion gap: 10 (ref 5–15)
BUN: 59 mg/dL — ABNORMAL HIGH (ref 8–23)
CO2: 23 mmol/L (ref 22–32)
Calcium: 8.3 mg/dL — ABNORMAL LOW (ref 8.9–10.3)
Chloride: 109 mmol/L (ref 98–111)
Creatinine, Ser: 3.3 mg/dL — ABNORMAL HIGH (ref 0.61–1.24)
GFR, Estimated: 18 mL/min — ABNORMAL LOW (ref >60)
Glucose, Bld: 108 mg/dL — ABNORMAL HIGH (ref 70–99)
Phosphorus: 4.3 mg/dL (ref 2.5–4.6)
Potassium: 4.4 mmol/L (ref 3.5–5.1)
Sodium: 142 mmol/L (ref 135–145)

## 2021-06-28 LAB — CBC WITH DIFFERENTIAL/PLATELET
Abs Immature Granulocytes: 0.09 10*3/uL — ABNORMAL HIGH (ref 0.00–0.07)
Basophils Absolute: 0 10*3/uL (ref 0.0–0.1)
Basophils Relative: 0 %
Eosinophils Absolute: 0.2 10*3/uL (ref 0.0–0.5)
Eosinophils Relative: 2 %
HCT: 29.6 % — ABNORMAL LOW (ref 39.0–52.0)
Hemoglobin: 9.7 g/dL — ABNORMAL LOW (ref 13.0–17.0)
Immature Granulocytes: 1 %
Lymphocytes Relative: 16 %
Lymphs Abs: 1.7 10*3/uL (ref 0.7–4.0)
MCH: 31.2 pg (ref 26.0–34.0)
MCHC: 32.8 g/dL (ref 30.0–36.0)
MCV: 95.2 fL (ref 80.0–100.0)
Monocytes Absolute: 1.5 10*3/uL — ABNORMAL HIGH (ref 0.1–1.0)
Monocytes Relative: 14 %
Neutro Abs: 6.9 10*3/uL (ref 1.7–7.7)
Neutrophils Relative %: 67 %
Platelets: 185 10*3/uL (ref 150–400)
RBC: 3.11 MIL/uL — ABNORMAL LOW (ref 4.22–5.81)
RDW: 14.3 % (ref 11.5–15.5)
WBC: 10.4 10*3/uL (ref 4.0–10.5)
nRBC: 0 % (ref 0.0–0.2)

## 2021-06-28 LAB — GLUCOSE, CAPILLARY
Glucose-Capillary: 118 mg/dL — ABNORMAL HIGH (ref 70–99)
Glucose-Capillary: 136 mg/dL — ABNORMAL HIGH (ref 70–99)
Glucose-Capillary: 154 mg/dL — ABNORMAL HIGH (ref 70–99)
Glucose-Capillary: 167 mg/dL — ABNORMAL HIGH (ref 70–99)

## 2021-06-28 MED ORDER — SODIUM CHLORIDE 0.9 % IV SOLN: INTRAVENOUS | Status: DC

## 2021-06-28 NOTE — Progress Notes (Signed)
[facility]  ROUNDING NOTE   Subjective:   Patient currently resting quietly No family at bedside Expressive aphasia  Creatinine currently 3.3  Objective:  Vital signs in last 24 hours:  Temp:  [97.7 F (36.5 C)-98.5 F (36.9 C)] 98.5 F (36.9 C) (02/21 1133) Pulse Rate:  [59-67] 67 (02/21 1133) Resp:  [16-18] 18 (02/21 1133) BP: (119-151)/(56-83) 129/83 (02/21 1133) SpO2:  [94 %-100 %] 100 % (02/21 1133)  Weight change:  Filed Weights   06/23/21 0335 06/24/21 0354 06/25/21 0339  Weight: 77.9 kg 77.7 kg 75 kg    Intake/Output: I/O last 3 completed shifts: In: -  Out: 775 [Urine:775]   Intake/Output this shift:  Total I/O In: 701.8 [I.V.:701.8] Out: 550 [Urine:550]  Physical Exam: General: NAD  Head: Normocephalic, atraumatic. Moist oral mucosal membranes  Eyes: Anicteric  Lungs:  Clear to auscultation, normal effort  Heart: Regular rate and rhythm  Abdomen:  Soft, nontender  Extremities:  trace peripheral edema.  Neurologic: Nonfocal, moving all four extremities  Skin: No lesions       Basic Metabolic Panel: Recent Labs  Lab 06/23/21 0315 06/24/21 0409 06/25/21 0456 06/26/21 0412 06/27/21 0616 06/28/21 0523  NA 137 139 139 139 140 142  K 4.3 4.5 5.0 4.6 4.4 4.4  CL 103 105 106 104 107 109  CO2 23 25 24 25 25 23   GLUCOSE 258* 237* 187* 169* 138* 108*  BUN 67* 78* 86* 89* 71* 59*  CREATININE 4.10* 4.14* 4.20* 4.15* 3.62* 3.30*  CALCIUM 8.2* 8.0* 8.1* 8.1* 8.3* 8.3*  MG 2.3 2.3 2.4 2.4 2.4  --   PHOS 4.2  --  5.9* 5.4* 4.4 4.3     Liver Function Tests: Recent Labs  Lab 06/22/21 1442 06/28/21 0523  AST 13*  --   ALT 18  --   ALKPHOS 74  --   BILITOT 0.7  --   PROT 5.8*  --   ALBUMIN 3.3* 3.0*    No results for input(s): LIPASE, AMYLASE in the last 168 hours. No results for input(s): AMMONIA in the last 168 hours.  CBC: Recent Labs  Lab 06/22/21 1442 06/23/21 0458 06/24/21 0409 06/25/21 0456 06/26/21 0412  06/27/21 0616 06/28/21 0523  WBC 10.5   < > 24.2* 18.7* 13.6* 11.1* 10.4  NEUTROABS 7.4  --   --   --  10.1*  --  6.9  HGB 9.8*   < > 9.7* 9.5* 9.0* 9.2* 9.7*  HCT 30.8*   < > 29.6* 29.1* 28.0* 27.6* 29.6*  MCV 97.2   < > 95.5 95.7 95.6 94.5 95.2  PLT 192   < > 199 188 183 181 185   < > = values in this interval not displayed.     Cardiac Enzymes: No results for input(s): CKTOTAL, CKMB, CKMBINDEX, TROPONINI in the last 168 hours.  BNP: Invalid input(s): POCBNP  CBG: Recent Labs  Lab 06/27/21 1259 06/27/21 1733 06/27/21 2038 06/28/21 0818 06/28/21 1214  GLUCAP 149* 253* 141* 118* 136*     Microbiology: Results for orders placed or performed during the hospital encounter of 06/22/21  Blood culture (routine x 2)     Status: None   Collection Time: 06/22/21  2:43 PM   Specimen: BLOOD  Result Value Ref Range Status   Specimen Description BLOOD BLOOD RIGHT HAND  Final   Special Requests   Final    BOTTLES DRAWN AEROBIC AND ANAEROBIC Blood Culture adequate volume   Culture   Final  NO GROWTH 5 DAYS Performed at [facility], [address] [place], [place] [address]    Report Status 06/27/2021 FINAL  Final  Resp Panel by RT-PCR (Flu A&B, Covid) Nasopharyngeal Swab     Status: None   Collection Time: 06/22/21  3:10 PM   Specimen: Nasopharyngeal Swab; Nasopharyngeal(NP) swabs in vial transport medium  Result Value Ref Range Status   SARS Coronavirus 2 by RT PCR NEGATIVE NEGATIVE Final    Comment: (NOTE) SARS-CoV-2 target nucleic acids are NOT DETECTED.  The SARS-CoV-2 RNA is generally detectable in upper respiratory specimens during the acute phase of infection. The lowest concentration of SARS-CoV-2 viral copies this assay can detect is 138 copies/mL. A negative result does not preclude SARS-Cov-2 infection and should not be used as the sole basis for treatment or other patient management decisions. A negative result may occur with  improper specimen  collection/handling, submission of specimen other than nasopharyngeal swab, presence of viral mutation(s) within the areas targeted by this assay, and inadequate number of viral copies(<138 copies/mL). A negative result must be combined with clinical observations, patient history, and epidemiological information. The expected result is Negative.  Fact Sheet for Patients:  EntrepreneurPulse.com.au  Fact Sheet for Healthcare Providers:  IncredibleEmployment.be  This test is no t yet approved or cleared by the [place] FDA and  has been authorized for detection and/or diagnosis of SARS-CoV-2 by FDA under an Emergency Use Authorization (EUA). This EUA will remain  in effect (meaning this test can be used) for the duration of the COVID-19 declaration under Section 564(b)(1) of the Act, 21 U.S.C.section 360bbb-3(b)(1), unless the authorization is terminated  or revoked sooner.       Influenza A by PCR NEGATIVE NEGATIVE Final   Influenza B by PCR NEGATIVE NEGATIVE Final    Comment: (NOTE) The Xpert Xpress SARS-CoV-2/FLU/RSV plus assay is intended as an aid in the diagnosis of influenza from Nasopharyngeal swab specimens and should not be used as a sole basis for treatment. Nasal washings and aspirates are unacceptable for Xpert Xpress SARS-CoV-2/FLU/RSV testing.  Fact Sheet for Patients: EntrepreneurPulse.com.au  Fact Sheet for Healthcare Providers: IncredibleEmployment.be  This test is not yet approved or cleared by the [place] FDA and has been authorized for detection and/or diagnosis of SARS-CoV-2 by FDA under an Emergency Use Authorization (EUA). This EUA will remain in effect (meaning this test can be used) for the duration of the COVID-19 declaration under Section 564(b)(1) of the Act, 21 U.S.C. section 360bbb-3(b)(1), unless the authorization is terminated or revoked.  Performed at [facility], [address] [place], [place] [address]   Blood culture (routine x 2)     Status: None   Collection Time: 06/22/21  3:45 PM   Specimen: BLOOD  Result Value Ref Range Status   Specimen Description BLOOD LEFT ANTECUBITAL  Final   Special Requests   Final    BOTTLES DRAWN AEROBIC AND ANAEROBIC Blood Culture adequate volume   Culture   Final    NO GROWTH 5 DAYS Performed at [facility], [address], [place], [place] [address]    Report Status 06/27/2021 FINAL  Final  MRSA Next Gen by PCR, Nasal     Status: None   Collection Time: 06/22/21  5:37 PM   Specimen: Nasal Mucosa; Nasal Swab  Result Value Ref Range Status   MRSA by PCR Next Gen NOT DETECTED NOT DETECTED Final    Comment: (NOTE) The GeneXpert MRSA Assay (FDA approved  for NASAL specimens only), is one component of a comprehensive MRSA colonization surveillance program. It is not intended to diagnose MRSA infection nor to guide or monitor treatment for MRSA infections. Test performance is not FDA approved in patients less than 67 years old. Performed at [facility], [address], [place], [place] [address]   Urine Culture     Status: Abnormal   Collection Time: 06/24/21 12:14 PM   Specimen: Urine, Random  Result Value Ref Range Status   Specimen Description   Final    URINE, RANDOM Performed at [facility], [address] [place], [place] [address]    Special Requests   Final    NONE Performed at [facility], [address] [place], [place] [address]    Culture >=100,000 COLONIES/mL ENTEROCOCCUS FAECALIS (A)  Final   Report Status 06/27/2021 FINAL  Final   Organism ID, Bacteria ENTEROCOCCUS FAECALIS (A)  Final      Susceptibility   Enterococcus faecalis - MIC*    AMPICILLIN <=2 SENSITIVE Sensitive     NITROFURANTOIN <=16 SENSITIVE Sensitive     VANCOMYCIN 1 SENSITIVE Sensitive     * >=100,000 COLONIES/mL ENTEROCOCCUS FAECALIS    Coagulation  Studies: No results for input(s): LABPROT, INR in the last 72 hours.  Urinalysis: No results for input(s): COLORURINE, LABSPEC, PHURINE, GLUCOSEU, HGBUR, BILIRUBINUR, KETONESUR, PROTEINUR, UROBILINOGEN, NITRITE, LEUKOCYTESUR in the last 72 hours.  Invalid input(s): APPERANCEUR    Imaging: CT HEAD WO CONTRAST (5MM)  Result Date: 06/26/2021 CLINICAL DATA:  Mental status change, unknown cause. EXAM: CT HEAD WITHOUT CONTRAST TECHNIQUE: Contiguous axial images were obtained from the base of the skull through the vertex without intravenous contrast. RADIATION DOSE REDUCTION: This exam was performed according to the departmental dose-optimization program which includes automated exposure control, adjustment of the mA and/or kV according to patient size and/or use of iterative reconstruction technique. COMPARISON:  06/22/2021 FINDINGS: Brain: Chronic small-vessel ischemic changes affect the pons. Cerebellar atrophy without focal insult. Cerebral hemispheres show generalized age related atrophy without lobar predominance. Chronic small-vessel changes of the white matter. No sign of acute infarction, mass lesion, hemorrhage, hydrocephalus or extra-axial collection. Vascular: There is atherosclerotic calcification of the major vessels at the base of the brain. Skull: Negative Sinuses/Orbits: Clear except for mucosal thickening of the sphenoid sinus. Orbits negative. Other: None IMPRESSION: No acute intracranial finding. Atrophy and chronic small-vessel ischemic changes. Sphenoid sinus inflammatory changes. Electronically Signed   By: Nelson Chimes M.D.   On: 06/26/2021 17:37     Medications:    sodium chloride Stopped (06/26/21 0039)   sodium chloride Stopped (06/28/21 0654)   sodium chloride     ampicillin-sulbactam (UNASYN) IV 3 g (06/28/21 0654)    arformoterol  15 mcg Nebulization BID   aspirin EC  81 mg Oral Daily   atorvastatin  80 mg Per Tube QHS   budesonide (PULMICORT) nebulizer solution  0.25  mg Nebulization BID   Chlorhexidine Gluconate Cloth  6 each Topical Daily   cholecalciferol  2,000 Units Oral Daily   citalopram  20 mg Oral Daily   feeding supplement (NEPRO CARB STEADY)  237 mL Oral TID BM   heparin  5,000 Units Subcutaneous Q8H   insulin aspart  0-15 Units Subcutaneous TID AC & HS   insulin glargine-yfgn  5 Units Subcutaneous Daily   multivitamin with minerals  1 tablet Oral Daily   pantoprazole  40 mg Oral Daily   revefenacin  175 mcg Nebulization Daily  tamsulosin  0.8 mg Oral QHS   vitamin B-12  1,000 mcg Oral Daily   albuterol, azelastine, docusate, food thickener, polyethylene glycol  Assessment/ Plan:  Mr. Phillip Reyes is a 81 y.o.  male with a PMHx of diabetes mellitus type 2, hypertension, CVA, peripheral vascular disease, history of chronic kidney disease stage IV, osteoarthritis, prostate cancer, anemia of chronic kidney disease, who was admitted to [facility] on 06/22/2021 for evaluation of increasing shortness of breath.   Diabetes mellitus type 2 with chronic kidney disease/acute kidney injury/chronic kidney disease stage IV baseline creatinine 3.6.  Suspect acute kidney injury now related to concurrent illness.   Renal function currently better than baseline.  Glucose remains stable    Lab Results  Component Value Date   CREATININE 3.30 (H) 06/28/2021   CREATININE 3.62 (H) 06/27/2021   CREATININE 4.15 (H) 06/26/2021    Intake/Output Summary (Last 24 hours) at 06/28/2021 1225 Last data filed at 06/28/2021 1135 Gross per 24 hour  Intake 701.82 ml  Output 850 ml  Net -148.18 ml    2.  Acute respiratory failure.  Defer decisions regarding respiratory status and intervention to pulmonary/critical care.  Remains on room air  3.  Anemia of chronic kidney disease.  Hemoglobin currently 9.7.  Within acceptable range.  No need for ESA's at this time   LOS: 6 [place] 2/21/202312:25 PM

## 2021-06-28 NOTE — Progress Notes (Signed)
PROGRESS NOTE    Phillip Reyes  JJK:[id number] DOB: Sep 16, 1940 DOA: 06/22/2021 PCP: [facility]    Chief Complaint  Patient presents with   Shortness of Breath    Brief Narrative: This is an 81 yo male who presented to [facility] ER on 02/15 with shortness of breath.  Per ER notes EMS reported the pt was recently hospitalized at the [facility] for 1 month with acute respiratory failure in the setting of CHF requiring aggressive diuresis, however he signed out AMA on 02/14.  Upon EMS arrival at pts home he was hypoxic in acute respiratory distress.  He was placed on Bipap with initial improvement, however en route to the ER pt became increasingly drowsy.     ED course Upon arrival to the ER pt difficult to arouse with low tidal volumes on Bipap.  Therefore, pt required emergent mechanical intubation.  CXR negative for acute abnormality.  Lab results revealed glucose 295, BUN 63, creatinine 4.10, albumin 3.3, hgb 9.8, and COVID-19/Influenza PCR A&B negative.    Patient admitted to PCCM, intubated and subsequently extubated on room air.  Patient placed empirically on IV Unasyn for concern for aspiration pneumonia.  Patient seen by speech therapy.  Urine cultures positive for Enterococcus species. Patient transferred to the floor and care transferred to [facility]   Assessment & Plan:   Principal Problem:   Acute respiratory failure ([place]) Active Problems:   Aspiration pneumonia ([place])   Acute encephalopathy   Type 2 diabetes mellitus (HCC)   HTN (hypertension)   COPD (chronic obstructive pulmonary disease) (HCC)   CKD (chronic kidney disease), stage III (HCC)   Malnutrition of moderate degree   Monocytosis   Enterococcus UTI   AKI (acute kidney injury) ([place])   Acute kidney injury ([place])  #1 acute on chronic respiratory failure likely secondary to aspiration pneumonia, concerned that CHF may be a contributing factor in the setting of underlying COPD -Patient admitted was dyspneic, drowsy  and subsequently intubated. -Continue scheduled nebulizers steroids, scheduled bronchodilator therapy, pulmonary hygiene. -Patient seen by SLP. -Continue IV Unasyn to complete a 5-day course of treatment. -Improving clinically, currently with sats of 100% on room air. -Keep sats >=88%.  2.  Acute renal failure on chronic kidney disease stage IV -Renal function trending down.   -Urine output not properly recorded. -Creatinine on admission was 4.10 went up as high as 4.20 trending down and currently at 3.30. -Patient with poor oral intake, ate 0% of his breakfast today. -Placed on gentle hydration of normal saline 30 cc an hour for the next 24 hours. -Nephrology following and appreciate input and recommendations.  3.  Anemia of chronic disease -H&H stable. -Per nephrology.  4.  Enterococcus UTI -On IV Unasyn.  5.  Acute metabolic encephalopathy -Likely secondary to acute illness. -Head CT done x2 negative for any acute abnormalities. -Patient with no focal neurological deficits. -Clinical improvement -Continue IV antibiotics, gentle hydration, supportive care.  6.  Diabetes mellitus type 2 -Hemoglobin A1c 6.7 (06/22/2021) -CBG 118 this morning. -Continue Semglee 5 units daily, SSI.  7.  BPH -Continue Flomax.   DVT prophylaxis: Heparin Code Status: Full Family Communication: No family at bedside Disposition: Likely home with home health versus SNF  Status is: Inpatient Remains inpatient appropriate because: Severity of illness           Consultants:  Admitted to PCCM Nephrology: Dr. Holley Raring 06/25/2021  Procedures:  Intubated 06/22/2021 >>>> extubated 06/24/2021 CT head 06/22/2021, 06/26/2021 Chest x-ray 06/22/2021, 06/23/2021 Renal  ultrasound 06/26/2021 Lower extremity Dopplers 06/23/2021 2D echo with bubble study 06/25/2021    Antimicrobials:  IV Unasyn 06/24/2021>>>> 06/29/2021   Subjective: Laying in bed.  Alert and oriented to self place and time.  No chest  pain.  No shortness of breath.  Poor oral intake.  Ate 0% of his breakfast.    Objective: Vitals:   06/28/21 0328 06/28/21 0748 06/28/21 0841 06/28/21 1133  BP: (!) 134/56 (!) 142/61  129/83  Pulse: 60 (!) 59  67  Resp: 16 18  18   Temp: 97.9 F (36.6 C) 97.7 F (36.5 C)  98.5 F (36.9 C)  TempSrc:      SpO2: 100% 94% 94% 100%  Weight:      Height:        Intake/Output Summary (Last 24 hours) at 06/28/2021 1224 Last data filed at 06/28/2021 1135 Gross per 24 hour  Intake 701.82 ml  Output 850 ml  Net -148.18 ml    Filed Weights   06/23/21 0335 06/24/21 0354 06/25/21 0339  Weight: 77.9 kg 77.7 kg 75 kg    Examination:  General exam: NAD.  Dry mucous membranes. Respiratory system: CTA B.  Anterior lung fields.  Respiratory effort normal.   Cardiovascular system: S1 & S2 heard, RRR. No JVD, murmurs, rubs, gallops or clicks. No pedal edema. Gastrointestinal system: Abdomen is soft, nontender, nondistended, positive bowel sounds.  No rebound.  No guarding.   Central nervous system: Alert and oriented. No focal neurological deficits. Extremities: Symmetric 5 x 5 power. Skin: No rashes, lesions or ulcers Psychiatry: Judgement and insight appear fair. Mood & affect appropriate.     Data Reviewed: I have personally reviewed following labs and imaging studies  CBC: Recent Labs  Lab 06/22/21 1442 06/23/21 0458 06/24/21 0409 06/25/21 0456 06/26/21 0412 06/27/21 0616 06/28/21 0523  WBC 10.5   < > 24.2* 18.7* 13.6* 11.1* 10.4  NEUTROABS 7.4  --   --   --  10.1*  --  6.9  HGB 9.8*   < > 9.7* 9.5* 9.0* 9.2* 9.7*  HCT 30.8*   < > 29.6* 29.1* 28.0* 27.6* 29.6*  MCV 97.2   < > 95.5 95.7 95.6 94.5 95.2  PLT 192   < > 199 188 183 181 185   < > = values in this interval not displayed.     Basic Metabolic Panel: Recent Labs  Lab 06/23/21 0315 06/24/21 0409 06/25/21 0456 06/26/21 0412 06/27/21 0616 06/28/21 0523  NA 137 139 139 139 140 142  K 4.3 4.5 5.0 4.6 4.4 4.4   CL 103 105 106 104 107 109  CO2 23 25 24 25 25 23   GLUCOSE 258* 237* 187* 169* 138* 108*  BUN 67* 78* 86* 89* 71* 59*  CREATININE 4.10* 4.14* 4.20* 4.15* 3.62* 3.30*  CALCIUM 8.2* 8.0* 8.1* 8.1* 8.3* 8.3*  MG 2.3 2.3 2.4 2.4 2.4  --   PHOS 4.2  --  5.9* 5.4* 4.4 4.3     GFR: Estimated Creatinine Clearance: 18.4 mL/min (A) (by C-G formula based on SCr of 3.3 mg/dL (H)).  Liver Function Tests: Recent Labs  Lab 06/22/21 1442 06/28/21 0523  AST 13*  --   ALT 18  --   ALKPHOS 74  --   BILITOT 0.7  --   PROT 5.8*  --   ALBUMIN 3.3* 3.0*     CBG: Recent Labs  Lab 06/27/21 1259 06/27/21 1733 06/27/21 2038 06/28/21 0818 06/28/21 1214  GLUCAP 149* 253* 141*  118* 136*      Recent Results (from the past 240 hour(s))  Blood culture (routine x 2)     Status: None   Collection Time: 06/22/21  2:43 PM   Specimen: BLOOD  Result Value Ref Range Status   Specimen Description BLOOD BLOOD RIGHT HAND  Final   Special Requests   Final    BOTTLES DRAWN AEROBIC AND ANAEROBIC Blood Culture adequate volume   Culture   Final    NO GROWTH 5 DAYS Performed at [facility], [address] [place], [place] [address]    Report Status 06/27/2021 FINAL  Final  Resp Panel by RT-PCR (Flu A&B, Covid) Nasopharyngeal Swab     Status: None   Collection Time: 06/22/21  3:10 PM   Specimen: Nasopharyngeal Swab; Nasopharyngeal(NP) swabs in vial transport medium  Result Value Ref Range Status   SARS Coronavirus 2 by RT PCR NEGATIVE NEGATIVE Final    Comment: (NOTE) SARS-CoV-2 target nucleic acids are NOT DETECTED.  The SARS-CoV-2 RNA is generally detectable in upper respiratory specimens during the acute phase of infection. The lowest concentration of SARS-CoV-2 viral copies this assay can detect is 138 copies/mL. A negative result does not preclude SARS-Cov-2 infection and should not be used as the sole basis for treatment or other patient management decisions. A negative result  may occur with  improper specimen collection/handling, submission of specimen other than nasopharyngeal swab, presence of viral mutation(s) within the areas targeted by this assay, and inadequate number of viral copies(<138 copies/mL). A negative result must be combined with clinical observations, patient history, and epidemiological information. The expected result is Negative.  Fact Sheet for Patients:  EntrepreneurPulse.com.au  Fact Sheet for Healthcare Providers:  IncredibleEmployment.be  This test is no t yet approved or cleared by the [place] FDA and  has been authorized for detection and/or diagnosis of SARS-CoV-2 by FDA under an Emergency Use Authorization (EUA). This EUA will remain  in effect (meaning this test can be used) for the duration of the COVID-19 declaration under Section 564(b)(1) of the Act, 21 U.S.C.section 360bbb-3(b)(1), unless the authorization is terminated  or revoked sooner.       Influenza A by PCR NEGATIVE NEGATIVE Final   Influenza B by PCR NEGATIVE NEGATIVE Final    Comment: (NOTE) The Xpert Xpress SARS-CoV-2/FLU/RSV plus assay is intended as an aid in the diagnosis of influenza from Nasopharyngeal swab specimens and should not be used as a sole basis for treatment. Nasal washings and aspirates are unacceptable for Xpert Xpress SARS-CoV-2/FLU/RSV testing.  Fact Sheet for Patients: EntrepreneurPulse.com.au  Fact Sheet for Healthcare Providers: IncredibleEmployment.be  This test is not yet approved or cleared by the [place] FDA and has been authorized for detection and/or diagnosis of SARS-CoV-2 by FDA under an Emergency Use Authorization (EUA). This EUA will remain in effect (meaning this test can be used) for the duration of the COVID-19 declaration under Section 564(b)(1) of the Act, 21 U.S.C. section 360bbb-3(b)(1), unless the authorization is terminated  or revoked.  Performed at [facility], [address] [place], [place] [address]   Blood culture (routine x 2)     Status: None   Collection Time: 06/22/21  3:45 PM   Specimen: BLOOD  Result Value Ref Range Status   Specimen Description BLOOD LEFT ANTECUBITAL  Final   Special Requests   Final    BOTTLES DRAWN AEROBIC AND ANAEROBIC Blood Culture adequate volume   Culture   Final  NO GROWTH 5 DAYS Performed at [facility], [address] [place], [place] [address]    Report Status 06/27/2021 FINAL  Final  MRSA Next Gen by PCR, Nasal     Status: None   Collection Time: 06/22/21  5:37 PM   Specimen: Nasal Mucosa; Nasal Swab  Result Value Ref Range Status   MRSA by PCR Next Gen NOT DETECTED NOT DETECTED Final    Comment: (NOTE) The GeneXpert MRSA Assay (FDA approved for NASAL specimens only), is one component of a comprehensive MRSA colonization surveillance program. It is not intended to diagnose MRSA infection nor to guide or monitor treatment for MRSA infections. Test performance is not FDA approved in patients less than 54 years old. Performed at [facility], [address] [place], [place] [address]   Urine Culture     Status: Abnormal   Collection Time: 06/24/21 12:14 PM   Specimen: Urine, Random  Result Value Ref Range Status   Specimen Description   Final    URINE, RANDOM Performed at [facility], [address] [place], [place] [address]    Special Requests   Final    NONE Performed at [facility], [address] [place], [place] [address]    Culture >=100,000 COLONIES/mL ENTEROCOCCUS FAECALIS (A)  Final   Report Status 06/27/2021 FINAL  Final   Organism ID, Bacteria ENTEROCOCCUS FAECALIS (A)  Final      Susceptibility   Enterococcus faecalis - MIC*    AMPICILLIN <=2 SENSITIVE Sensitive     NITROFURANTOIN <=16 SENSITIVE Sensitive     VANCOMYCIN 1 SENSITIVE Sensitive     * >=100,000 COLONIES/mL  ENTEROCOCCUS FAECALIS          Radiology Studies: CT HEAD WO CONTRAST (5MM)  Result Date: 06/26/2021 CLINICAL DATA:  Mental status change, unknown cause. EXAM: CT HEAD WITHOUT CONTRAST TECHNIQUE: Contiguous axial images were obtained from the base of the skull through the vertex without intravenous contrast. RADIATION DOSE REDUCTION: This exam was performed according to the departmental dose-optimization program which includes automated exposure control, adjustment of the mA and/or kV according to patient size and/or use of iterative reconstruction technique. COMPARISON:  06/22/2021 FINDINGS: Brain: Chronic small-vessel ischemic changes affect the pons. Cerebellar atrophy without focal insult. Cerebral hemispheres show generalized age related atrophy without lobar predominance. Chronic small-vessel changes of the white matter. No sign of acute infarction, mass lesion, hemorrhage, hydrocephalus or extra-axial collection. Vascular: There is atherosclerotic calcification of the major vessels at the base of the brain. Skull: Negative Sinuses/Orbits: Clear except for mucosal thickening of the sphenoid sinus. Orbits negative. Other: None IMPRESSION: No acute intracranial finding. Atrophy and chronic small-vessel ischemic changes. Sphenoid sinus inflammatory changes. Electronically Signed   By: Nelson Chimes M.D.   On: 06/26/2021 17:37        Scheduled Meds:  arformoterol  15 mcg Nebulization BID   aspirin EC  81 mg Oral Daily   atorvastatin  80 mg Per Tube QHS   budesonide (PULMICORT) nebulizer solution  0.25 mg Nebulization BID   Chlorhexidine Gluconate Cloth  6 each Topical Daily   cholecalciferol  2,000 Units Oral Daily   citalopram  20 mg Oral Daily   feeding supplement (NEPRO CARB STEADY)  237 mL Oral TID BM   heparin  5,000 Units Subcutaneous Q8H   insulin aspart  0-15 Units Subcutaneous TID AC & HS   insulin glargine-yfgn  5 Units Subcutaneous Daily   multivitamin with minerals  1 tablet  Oral Daily   pantoprazole  40 mg Oral Daily   revefenacin  175 mcg Nebulization Daily   tamsulosin  0.8 mg Oral QHS   vitamin B-12  1,000 mcg Oral Daily   Continuous Infusions:  sodium chloride Stopped (06/26/21 0039)   sodium chloride Stopped (06/28/21 0654)   ampicillin-sulbactam (UNASYN) IV 3 g (06/28/21 0654)     LOS: 6 days    Time spent: 40 minutes    Irine Seal, MD [facility]   To contact the attending provider between 7A-7P or the covering provider during after hours 7P-7A, please log into the web site [email or web address] and access using universal [place] password for that web site. If you do not have the password, please call the hospital operator.  06/28/2021, 12:24 PM

## 2021-06-28 NOTE — TOC Progression Note (Signed)
Transition of Care [facility]) - Progression Note    Patient Details  Name: Phillip Reyes MRN: [id number] Date of Birth: 13-Feb-1941  Transition of Care [facility]) CM/SW [place], RN Phone Number: 06/28/2021, 12:37 PM  Clinical Narrative:   Spoke with the patient's daughter [name] and the care Giver [name] ant they both agree that going home with home health [facility], [name] already has Ins approval No DME needed    Expected Discharge Plan:  (TBD) Barriers to Discharge: Continued Medical Work up  Expected Discharge Plan and Services Expected Discharge Plan:  (TBD)   Discharge Planning Services: CM Consult   Living arrangements for the past 2 months: Single Family Home                 DME Arranged: N/A DME Agency: NA       HH Arranged: NA HH Agency: NA         Social Determinants of Health (SDOH) Interventions    Readmission Risk Interventions No flowsheet data found.

## 2021-06-29 LAB — GLUCOSE, CAPILLARY
Glucose-Capillary: 99 mg/dL (ref 70–99)
Glucose-Capillary: 161 mg/dL — ABNORMAL HIGH (ref 70–99)
Glucose-Capillary: 135 mg/dL — ABNORMAL HIGH (ref 70–99)

## 2021-06-29 LAB — CBC
HCT: 27.1 % — ABNORMAL LOW (ref 39.0–52.0)
Hemoglobin: 8.9 g/dL — ABNORMAL LOW (ref 13.0–17.0)
MCH: 31.2 pg (ref 26.0–34.0)
MCHC: 32.8 g/dL (ref 30.0–36.0)
MCV: 95.1 fL (ref 80.0–100.0)
Platelets: 169 10*3/uL (ref 150–400)
RBC: 2.85 MIL/uL — ABNORMAL LOW (ref 4.22–5.81)
RDW: 13.9 % (ref 11.5–15.5)
WBC: 8.5 10*3/uL (ref 4.0–10.5)
nRBC: 0 % (ref 0.0–0.2)

## 2021-06-29 LAB — RENAL FUNCTION PANEL
Albumin: 2.7 g/dL — ABNORMAL LOW (ref 3.5–5.0)
Anion gap: 9 (ref 5–15)
BUN: 46 mg/dL — ABNORMAL HIGH (ref 8–23)
CO2: 22 mmol/L (ref 22–32)
Calcium: 8 mg/dL — ABNORMAL LOW (ref 8.9–10.3)
Chloride: 108 mmol/L (ref 98–111)
Creatinine, Ser: 3.17 mg/dL — ABNORMAL HIGH (ref 0.61–1.24)
GFR, Estimated: 19 mL/min — ABNORMAL LOW (ref >60)
Glucose, Bld: 82 mg/dL (ref 70–99)
Phosphorus: 3.8 mg/dL (ref 2.5–4.6)
Potassium: 4.3 mmol/L (ref 3.5–5.1)
Sodium: 139 mmol/L (ref 135–145)

## 2021-06-29 NOTE — Discharge Summary (Signed)
Physician Discharge Summary   Patient: Phillip Reyes MRN: [id number] DOB: 27-Sep-1940  Admit date:     06/22/2021  Discharge date: 06/29/21  Discharge Physician: Sharen Hones   [facility]P: [facility]    Discharge Diagnoses: Principal Problem:   Acute respiratory failure [facility]) Active Problems:   Acute encephalopathy   Type 2 diabetes mellitus ([place])   HTN (hypertension)   COPD (chronic obstructive pulmonary disease) (HCC)   CKD (chronic kidney disease), stage III (HCC)   Malnutrition of moderate degree   Monocytosis   Enterococcus UTI   Aspiration pneumonia (HCC)   AKI (acute kidney injury) ([place])   Acute kidney injury ([place])  Resolved Problems:   * No resolved hospital problems. [facility] Course:  This is an 81 yo male who presented to [facility] ER on 02/15 with shortness of breath.  Per ER notes EMS reported the pt was recently hospitalized at the [facility] for 1 month with acute respiratory failure in the setting of CHF requiring aggressive diuresis, however he signed out AMA on 02/14.  Upon EMS arrival at pts home he was hypoxic in acute respiratory distress.  He was placed on Bipap with initial improvement, however en route to the ER pt became increasingly drowsy.     ED course Upon arrival to the ER pt difficult to arouse with low tidal volumes on Bipap.  Therefore, pt required emergent mechanical intubation.  CXR negative for acute abnormality.  Lab results revealed glucose 295, BUN 63, creatinine 4.10, albumin 3.3, hgb 9.8, and COVID-19/Influenza PCR A&B negative.     Patient admitted to PCCM, intubated and subsequently extubated on room air.  Patient placed empirically on IV Unasyn for concern for aspiration pneumonia.  Patient seen by speech therapy.  Urine cultures positive for Enterococcus species. Patient transferred to the floor and care transferred to [facility]   Assessment and Plan:  #1 acute on chronic respiratory failure likely secondary to aspiration pneumonia,  concerned that CHF may be a contributing factor in the setting of underlying COPD Acute metabolic encephalopathy at admission.  -Patient admitted was dyspneic, drowsy and subsequently intubated. Acute respite failure appears to be secondary to aspiration pneumonia, with chronic congestive heart failure and COPD. Condition has improved, patient off oxygen, he also finished 5 days of Unasyn.  Currently his sats at 100% on room air.  Medically stable to be discharged.  2.  Acute renal failure on chronic kidney disease stage IV Patient is followed by nephrology, renal function is better than baseline at this point.  He will follow-up with PCP as outpatient.  3.  Anemia of chronic disease -H&H stable.   4.  Enterococcus UTI Has completed.    Diabetes mellitus type 2 -Hemoglobin A1c 6.7 (06/22/2021) Resume home dose treatment regimen.  7.  BPH -Continue Flomax.       Consultants: Nephrology, ICU Procedures performed: intubation and mechanical ventilation  Disposition: Home Diet recommendation:  Discharge Diet Orders (From admission, onward)     Start     Ordered   06/29/21 0000  Diet - low sodium heart healthy        06/29/21 1405           Cardiac diet  DISCHARGE MEDICATION: Allergies as of 06/29/2021       Reactions   Norco [hydrocodone-acetaminophen] Shortness Of Breath        Medication List     STOP taking these medications    doxazosin 4 MG tablet Commonly known as:  CARDURA   guaiFENesin 600 MG 12 hr tablet Commonly known as: MUCINEX   lisinopril 40 MG tablet Commonly known as: ZESTRIL   OPTIVE 0.5-0.9 % ophthalmic solution Generic drug: carboxymethylcellul-glycerin   Vitamin D (Ergocalciferol) 1.25 MG (50000 UNIT) Caps capsule Commonly known as: DRISDOL       TAKE these medications    acetaminophen 325 MG tablet Commonly known as: TYLENOL Take 650 mg by mouth every 6 (six) hours as needed.   amLODipine 10 MG tablet Commonly known as:  NORVASC Take 1 tablet (10 mg total) by mouth daily.   aspirin EC 81 MG tablet Take 81 mg by mouth daily.   atorvastatin 80 MG tablet Commonly known as: LIPITOR Take 80 mg by mouth at bedtime.   azelastine 0.1 % nasal spray Commonly known as: ASTELIN Place 1 spray into both nostrils 2 (two) times daily as needed for rhinitis.   budesonide-formoterol 160-4.5 MCG/ACT inhaler Commonly known as: SYMBICORT Inhale 2 puffs into the lungs 2 (two) times daily.   citalopram 20 MG tablet Commonly known as: CELEXA Take 20 mg by mouth daily.   gabapentin 300 MG capsule Commonly known as: NEURONTIN Take 300 mg by mouth daily.   insulin glargine 100 UNIT/ML injection Commonly known as: LANTUS Inject 10 Units into the skin at bedtime.   insulin regular 100 units/mL injection Commonly known as: NOVOLIN R Inject 3-10 Units into the skin 3 (three) times daily before meals.   ipratropium-albuterol 0.5-2.5 (3) MG/3ML Soln Commonly known as: DUONEB Take 3 mLs by nebulization every 6 (six) hours as needed.   omeprazole 20 MG capsule Commonly known as: PRILOSEC Take 20 mg by mouth daily.   tamsulosin 0.4 MG Caps capsule Commonly known as: FLOMAX Take 0.8 mg by mouth at bedtime.   traZODone 50 MG tablet Commonly known as: DESYREL Take 50 mg by mouth at bedtime.   vitamin B-12 1000 MCG tablet Commonly known as: CYANOCOBALAMIN Take 1,000 mcg by mouth daily.   Vitamin D 50 MCG (2000 UT) Caps Take 1 capsule by mouth daily.        [facility] Follow up in 1 week(s).   Specialty: General Practice Contact information: [place] [address] [phone]                 Discharge Exam: Danley Danker Weights   06/24/21 0354 06/25/21 0339 06/29/21 0404  Weight: 77.7 kg 75 kg 79.8 kg   General exam: Appears calm and comfortable  Respiratory system: Clear to auscultation. Respiratory effort normal. Cardiovascular system: S1 & S2  heard, RRR. No JVD, murmurs, rubs, gallops or clicks. No pedal edema. Gastrointestinal system: Abdomen is nondistended, soft and nontender. No organomegaly or masses felt. Normal bowel sounds heard. Central nervous system: Alert and oriented. No focal neurological deficits. Extremities: Symmetric 5 x 5 power. Skin: No rashes, lesions or ulcers Psychiatry: Judgement and insight appear normal. Mood & affect appropriate.    Condition at discharge: good  The results of significant diagnostics from this hospitalization (including imaging, microbiology, ancillary and laboratory) are listed below for reference.   Imaging Studies: DG Chest 1 View  Result Date: 06/23/2021 CLINICAL DATA:  Tube placement, respiratory failure. EXAM: CHEST  1 VIEW COMPARISON:  06/22/2021 FINDINGS: The endotracheal tube tip is 5.8 cm above the carina. Nasogastric tube side port and tip are in the stomach body. AICD noted. No airspace opacity is identified. No blunting of the costophrenic angles. Heart  size within normal limits for projection. The patient is rotated to the right on today's radiograph, reducing diagnostic sensitivity and specificity. IMPRESSION: 1. Endotracheal tube tip is 5.8 cm above the carina, just below the thoracic inlet, but generally satisfactorily position. Nasogastric tube tip and side port are in the stomach body. 2. The lungs remain clear. Electronically Signed   By: Van Clines M.D.   On: 06/23/2021 17:25   CT HEAD WO CONTRAST (5MM)  Result Date: 06/26/2021 CLINICAL DATA:  Mental status change, unknown cause. EXAM: CT HEAD WITHOUT CONTRAST TECHNIQUE: Contiguous axial images were obtained from the base of the skull through the vertex without intravenous contrast. RADIATION DOSE REDUCTION: This exam was performed according to the departmental dose-optimization program which includes automated exposure control, adjustment of the mA and/or kV according to patient size and/or use of iterative  reconstruction technique. COMPARISON:  06/22/2021 FINDINGS: Brain: Chronic small-vessel ischemic changes affect the pons. Cerebellar atrophy without focal insult. Cerebral hemispheres show generalized age related atrophy without lobar predominance. Chronic small-vessel changes of the white matter. No sign of acute infarction, mass lesion, hemorrhage, hydrocephalus or extra-axial collection. Vascular: There is atherosclerotic calcification of the major vessels at the base of the brain. Skull: Negative Sinuses/Orbits: Clear except for mucosal thickening of the sphenoid sinus. Orbits negative. Other: None IMPRESSION: No acute intracranial finding. Atrophy and chronic small-vessel ischemic changes. Sphenoid sinus inflammatory changes. Electronically Signed   By: Nelson Chimes M.D.   On: 06/26/2021 17:37   CT HEAD WO CONTRAST (5MM)  Result Date: 06/22/2021 CLINICAL DATA:  Changes, shortness of breath, history fluid overload, hypoxia; post code, intubated EXAM: CT HEAD WITHOUT CONTRAST TECHNIQUE: Contiguous axial images were obtained from the base of the skull through the vertex without intravenous contrast. RADIATION DOSE REDUCTION: This exam was performed according to the departmental dose-optimization program which includes automated exposure control, adjustment of the mA and/or kV according to patient size and/or use of iterative reconstruction technique. COMPARISON:  11/08/2019 FINDINGS: Brain: Generalized atrophy. Normal ventricular morphology. No midline shift or mass effect. Minimal small vessel chronic ischemic changes of deep cerebral white matter. Otherwise normal appearance of brain parenchyma. No intracranial hemorrhage, mass lesion, or evidence of acute infarction. No extra-axial fluid collections. Vascular: No hyperdense vessels. Atherosclerotic calcification of internal carotid arteries at skull base. Skull: Intact Sinuses/Orbits: Mucosal thickening in the maxillary sinuses. Sinuses and mastoid air  cells otherwise clear. Other: N/A IMPRESSION: Atrophy with minimal small vessel chronic ischemic changes of deep cerebral white matter. No acute intracranial abnormalities. Electronically Signed   By: Lavonia Dana M.D.   On: 06/22/2021 17:30   US RENAL  Result Date: 06/26/2021 CLINICAL DATA:  81 year old male with acute renal insufficiency. EXAM: RENAL / URINARY TRACT ULTRASOUND COMPLETE COMPARISON:  Renal ultrasound 01/28/2019. CT Abdomen and Pelvis 11/23/2015. FINDINGS: Right Kidney: Renal measurements: 9.9 x 4.9 x 4.2 cm = volume: 108 mL. Some renal cortical volume loss since 2020 (image 6). Echogenicity within normal limits. No mass or hydronephrosis visualized. Left Kidney: Renal measurements: 10.0 x 3.8 x 4.1 cm = volume: 81.6 mL. Heterogeneous left renal cortical echogenicity similar to that in 2020. Some renal cortical volume loss since that time. No left hydronephrosis or renal mass. Bladder: Diminutive.  Appears normal for degree of bladder distention. Other: None. IMPRESSION: 1. No acute renal finding. 2. Some bilateral renal cortical atrophy since the 2020 ultrasound, and some evidence of chronic medical renal disease more pronounced in the left kidney. Electronically Signed   By: Lemmie Evens  [place] Crane M.D.   On: 06/26/2021 07:02   US Venous Img Lower Bilateral (DVT)  Result Date: 06/23/2021 CLINICAL DATA:  Edema EXAM: BILATERAL LOWER EXTREMITY VENOUS DOPPLER ULTRASOUND TECHNIQUE: Gray-scale sonography with compression, as well as color and duplex ultrasound, were performed to evaluate the deep venous system(s) from the level of the common femoral vein through the popliteal and proximal calf veins. COMPARISON:  None. FINDINGS: VENOUS Normal compressibility of the common femoral, superficial femoral, and popliteal veins, as well as the visualized calf veins. Visualized portions of profunda femoral vein and great saphenous vein unremarkable. No filling defects to suggest DVT on grayscale or color Doppler imaging.  Doppler waveforms show normal direction of venous flow, normal respiratory plasticity and response to augmentation. OTHER None. Limitations: none IMPRESSION: No femoropopliteal DVT nor evidence of DVT within the visualized calf veins. If clinical symptoms are inconsistent or if there are persistent or worsening symptoms, further imaging (possibly involving the iliac veins) may be warranted. Electronically Signed   By: Lucrezia Europe M.D.   On: 06/23/2021 12:29   DG Chest Portable 1 View  Result Date: 06/22/2021 CLINICAL DATA:  Shortness of breath. EXAM: PORTABLE CHEST 1 VIEW COMPARISON:  April 15, 2020. FINDINGS: The heart size and mediastinal contours are within normal limits. Endotracheal and nasogastric tubes are in grossly good position. Left-sided defibrillator is unchanged in position. Both lungs are clear. The visualized skeletal structures are unremarkable. IMPRESSION: Endotracheal and nasogastric tubes are in grossly good position. No acute abnormality seen. Electronically Signed   By: Marijo Conception M.D.   On: 06/22/2021 15:31   DG Abd Portable 1 View  Result Date: 06/22/2021 CLINICAL DATA:  Nasogastric tube placement. EXAM: PORTABLE ABDOMEN - 1 VIEW COMPARISON:  None. FINDINGS: The bowel gas pattern is normal. Distal tip of nasogastric tube is seen in proximal stomach. No radio-opaque calculi or other significant radiographic abnormality are seen. IMPRESSION: Distal tip of nasogastric tube seen in proximal stomach. Electronically Signed   By: Marijo Conception M.D.   On: 06/22/2021 15:32   ECHOCARDIOGRAM COMPLETE BUBBLE STUDY  Result Date: 06/26/2021    ECHOCARDIOGRAM REPORT   Patient Name:   Phillip Reyes Date of Exam: 06/25/2021 Medical Rec #:  [id number]     Height:       70.0 in Accession #:    [id number]    Weight:       165.3 lb Date of Birth:  Apr 29, 1941    BSA:          1.925 m Patient Age:    52 years      BP:           110/48 mmHg Patient Gender: M             HR:           60 bpm. Exam  Location:  [facility] Procedure: 2D Echo, Cardiac Doppler, Color Doppler and Saline Contrast Bubble            Study Indications:     Systolic heart failure ([place]) [[id number]]  History:         Patient has prior history of Echocardiogram examinations.                  Stroke; Risk Factors:Hypertension.  Sonographer:     Alyse Low Roar Referring Phys:  [id number] ADAM ROSS [place] Diagnosing Phys: Serafina Royals MD IMPRESSIONS  1. Left ventricular ejection fraction, by estimation, is 40 to 45%. The left ventricle has mildly  decreased function. The left ventricle demonstrates global hypokinesis. Left ventricular diastolic parameters were normal.  2. Right ventricular systolic function is normal. The right ventricular size is normal.  3. The mitral valve is normal in structure. Mild mitral valve regurgitation.  4. The aortic valve is normal in structure. Aortic valve regurgitation is trivial. Aortic valve sclerosis is present, with no evidence of aortic valve stenosis. FINDINGS  Left Ventricle: Left ventricular ejection fraction, by estimation, is 40 to 45%. The left ventricle has mildly decreased function. The left ventricle demonstrates global hypokinesis. The left ventricular internal cavity size was normal in size. There is  no left ventricular hypertrophy. Left ventricular diastolic parameters were normal. Right Ventricle: The right ventricular size is normal. No increase in right ventricular wall thickness. Right ventricular systolic function is normal. Left Atrium: Left atrial size was normal in size. Right Atrium: Right atrial size was normal in size. Pericardium: There is no evidence of pericardial effusion. Mitral Valve: The mitral valve is normal in structure. Mild mitral valve regurgitation. Tricuspid Valve: The tricuspid valve is normal in structure. Tricuspid valve regurgitation is mild. Aortic Valve: The aortic valve is normal in structure. Aortic valve regurgitation is trivial. Aortic valve sclerosis is present, with  no evidence of aortic valve stenosis. Aortic valve mean gradient measures 9.0 mmHg. Aortic valve peak gradient measures 19.3 mmHg. Aortic valve area, by VTI measures 0.95 cm. Pulmonic Valve: The pulmonic valve was normal in structure. Pulmonic valve regurgitation is not visualized. Aorta: The aortic root and ascending aorta are structurally normal, with no evidence of dilitation. IAS/Shunts: No atrial level shunt detected by color flow Doppler. Agitated saline contrast was given intravenously to evaluate for intracardiac shunting.  LEFT VENTRICLE PLAX 2D LVIDd:         5.05 cm   Diastology LVIDs:         4.29 cm   LV e' medial:    5.00 cm/s LV PW:         0.97 cm   LV E/e' medial:  16.3 LV IVS:        1.18 cm   LV e' lateral:   7.07 cm/s LVOT diam:     1.90 cm   LV E/e' lateral: 11.5 LV SV:         47 LV SV Index:   24 LVOT Area:     2.84 cm  RIGHT VENTRICLE RV Mid diam:    2.78 cm RV S prime:     12.80 cm/s TAPSE (M-mode): 2.7 cm LEFT ATRIUM             Index        RIGHT ATRIUM           Index LA diam:        3.60 cm 1.87 cm/m   RA Area:     15.10 cm LA Vol (A2C):   47.0 ml 24.41 ml/m  RA Volume:   34.40 ml  17.87 ml/m LA Vol (A4C):   40.7 ml 21.14 ml/m LA Biplane Vol: 47.0 ml 24.41 ml/m  AORTIC VALVE                     PULMONIC VALVE AV Area (Vmax):    1.00 cm      PV Vmax:        1.06 m/s AV Area (Vmean):   1.07 cm      PV Peak grad:   4.5 mmHg AV Area (VTI):  0.95 cm      RVOT Peak grad: 2 mmHg AV Vmax:           219.50 cm/s AV Vmean:          134.000 cm/s AV VTI:            0.496 m AV Peak Grad:      19.3 mmHg AV Mean Grad:      9.0 mmHg LVOT Vmax:         77.50 cm/s LVOT Vmean:        50.600 cm/s LVOT VTI:          0.166 m LVOT/AV VTI ratio: 0.33  AORTA Ao Root diam: 2.60 cm Ao Asc diam:  2.80 cm MITRAL VALVE                TRICUSPID VALVE MV Area (PHT): 3.33 cm     TR Peak grad:   31.8 mmHg MV Decel Time: 228 msec     TR Vmax:        282.00 cm/s MV E velocity: 81.50 cm/s MV A velocity: 106.00  cm/s  SHUNTS MV E/A ratio:  0.77         Systemic VTI:  0.17 m MV A Prime:    8.7 cm/s     Systemic Diam: 1.90 cm Serafina Royals MD Electronically signed by Serafina Royals MD Signature Date/Time: 06/26/2021/8:58:05 AM    Final     Microbiology: Results for orders placed or performed during the hospital encounter of 06/22/21  Blood culture (routine x 2)     Status: None   Collection Time: 06/22/21  2:43 PM   Specimen: BLOOD  Result Value Ref Range Status   Specimen Description BLOOD BLOOD RIGHT HAND  Final   Special Requests   Final    BOTTLES DRAWN AEROBIC AND ANAEROBIC Blood Culture adequate volume   Culture   Final    NO GROWTH 5 DAYS Performed at [facility], [address] [place], [place] [address]    Report Status 06/27/2021 FINAL  Final  Resp Panel by RT-PCR (Flu A&B, Covid) Nasopharyngeal Swab     Status: None   Collection Time: 06/22/21  3:10 PM   Specimen: Nasopharyngeal Swab; Nasopharyngeal(NP) swabs in vial transport medium  Result Value Ref Range Status   SARS Coronavirus 2 by RT PCR NEGATIVE NEGATIVE Final    Comment: (NOTE) SARS-CoV-2 target nucleic acids are NOT DETECTED.  The SARS-CoV-2 RNA is generally detectable in upper respiratory specimens during the acute phase of infection. The lowest concentration of SARS-CoV-2 viral copies this assay can detect is 138 copies/mL. A negative result does not preclude SARS-Cov-2 infection and should not be used as the sole basis for treatment or other patient management decisions. A negative result may occur with  improper specimen collection/handling, submission of specimen other than nasopharyngeal swab, presence of viral mutation(s) within the areas targeted by this assay, and inadequate number of viral copies(<138 copies/mL). A negative result must be combined with clinical observations, patient history, and epidemiological information. The expected result is Negative.  Fact Sheet for Patients:   EntrepreneurPulse.com.au  Fact Sheet for Healthcare Providers:  IncredibleEmployment.be  This test is no t yet approved or cleared by the [place] FDA and  has been authorized for detection and/or diagnosis of SARS-CoV-2 by FDA under an Emergency Use Authorization (EUA). This EUA will remain  in effect (meaning this test can be used) for the duration of the COVID-19 declaration under  Section 564(b)(1) of the Act, 21 U.S.C.section 360bbb-3(b)(1), unless the authorization is terminated  or revoked sooner.       Influenza A by PCR NEGATIVE NEGATIVE Final   Influenza B by PCR NEGATIVE NEGATIVE Final    Comment: (NOTE) The Xpert Xpress SARS-CoV-2/FLU/RSV plus assay is intended as an aid in the diagnosis of influenza from Nasopharyngeal swab specimens and should not be used as a sole basis for treatment. Nasal washings and aspirates are unacceptable for Xpert Xpress SARS-CoV-2/FLU/RSV testing.  Fact Sheet for Patients: EntrepreneurPulse.com.au  Fact Sheet for Healthcare Providers: IncredibleEmployment.be  This test is not yet approved or cleared by the [place] FDA and has been authorized for detection and/or diagnosis of SARS-CoV-2 by FDA under an Emergency Use Authorization (EUA). This EUA will remain in effect (meaning this test can be used) for the duration of the COVID-19 declaration under Section 564(b)(1) of the Act, 21 U.S.C. section 360bbb-3(b)(1), unless the authorization is terminated or revoked.  Performed at [facility], [address] [place], [place] [address]   Blood culture (routine x 2)     Status: None   Collection Time: 06/22/21  3:45 PM   Specimen: BLOOD  Result Value Ref Range Status   Specimen Description BLOOD LEFT ANTECUBITAL  Final   Special Requests   Final    BOTTLES DRAWN AEROBIC AND ANAEROBIC Blood Culture adequate volume   Culture   Final    NO GROWTH  5 DAYS Performed at [facility], [address], [place], [place] [address]    Report Status 06/27/2021 FINAL  Final  MRSA Next Gen by PCR, Nasal     Status: None   Collection Time: 06/22/21  5:37 PM   Specimen: Nasal Mucosa; Nasal Swab  Result Value Ref Range Status   MRSA by PCR Next Gen NOT DETECTED NOT DETECTED Final    Comment: (NOTE) The GeneXpert MRSA Assay (FDA approved for NASAL specimens only), is one component of a comprehensive MRSA colonization surveillance program. It is not intended to diagnose MRSA infection nor to guide or monitor treatment for MRSA infections. Test performance is not FDA approved in patients less than 43 years old. Performed at [facility], [address] [place], [place] [address]   Urine Culture     Status: Abnormal   Collection Time: 06/24/21 12:14 PM   Specimen: Urine, Random  Result Value Ref Range Status   Specimen Description   Final    URINE, RANDOM Performed at [facility], [address] [place], [place] [address]    Special Requests   Final    NONE Performed at [facility], [address] [place],  [address]    Culture >=100,000 COLONIES/mL ENTEROCOCCUS FAECALIS (A)  Final   Report Status 06/27/2021 FINAL  Final   Organism ID, Bacteria ENTEROCOCCUS FAECALIS (A)  Final      Susceptibility   Enterococcus faecalis - MIC*    AMPICILLIN <=2 SENSITIVE Sensitive     NITROFURANTOIN <=16 SENSITIVE Sensitive     VANCOMYCIN 1 SENSITIVE Sensitive     * >=100,000 COLONIES/mL ENTEROCOCCUS FAECALIS    Labs: CBC: Recent Labs  Lab 06/22/21 1442 06/23/21 0458 06/25/21 0456 06/26/21 0412 06/27/21 0616 06/28/21 0523 06/29/21 0317  WBC 10.5   < > 18.7* 13.6* 11.1* 10.4 8.5  NEUTROABS 7.4  --   --  10.1*  --  6.9  --   HGB 9.8*   < > 9.5* 9.0* 9.2* 9.7* 8.9*  HCT 30.8*   < > 29.1* 28.0* 27.6* 29.6* 27.1*  MCV 97.2   < > 95.7 95.6 94.5 95.2 95.1  PLT 192   < > 188 183 181 185 169   <  > = values in this interval not displayed.   Basic Metabolic Panel: Recent Labs  Lab 06/23/21 0315 06/24/21 0409 06/25/21 0456 06/26/21 0412 06/27/21 0616 06/28/21 0523 06/29/21 0317  NA 137 139 139 139 140 142 139  K 4.3 4.5 5.0 4.6 4.4 4.4 4.3  CL 103 105 106 104 107 109 108  CO2 23 25 24 25 25 23 22   GLUCOSE 258* 237* 187* 169* 138* 108* 82  BUN 67* 78* 86* 89* 71* 59* 46*  CREATININE 4.10* 4.14* 4.20* 4.15* 3.62* 3.30* 3.17*  CALCIUM 8.2* 8.0* 8.1* 8.1* 8.3* 8.3* 8.0*  MG 2.3 2.3 2.4 2.4 2.4  --   --   PHOS 4.2  --  5.9* 5.4* 4.4 4.3 3.8   Liver Function Tests: Recent Labs  Lab 06/22/21 1442 06/28/21 0523 06/29/21 0317  AST 13*  --   --   ALT 18  --   --   ALKPHOS 74  --   --   BILITOT 0.7  --   --   PROT 5.8*  --   --   ALBUMIN 3.3* 3.0* 2.7*   CBG: Recent Labs  Lab 06/28/21 1214 06/28/21 1620 06/28/21 2107 06/29/21 0815 06/29/21 1218  GLUCAP 136* 154* 167* 99 161*    Discharge time spent: greater than 30 minutes.  Signed: Sharen Hones, MD [facility] 06/29/2021

## 2021-06-29 NOTE — Progress Notes (Signed)
Occupational Therapy Treatment Patient Details Name: Phillip Reyes MRN: [id number] DOB: 03-Apr-1941 Today's Date: 06/29/2021   History of present illness Pt is an 81 y/o M admitted on 06/22/21 after presenting to the ED with c/o SOB. Pt required emergent mechanical intubation & was extubated on 06/24/21. Pt is being treated for acute on chronic respiratory failure possibly 2/2 aspriation PNA although CHF could be a contributing factor. Pt also with acute encephalopathy. Of note, pt was recently hospitalized at the [facility] for 1 month with ARF in the setting of CHF requiring aggressive diuresis but signed out [facility] 2/14. PMH: arthritis, COPD, DM2, HTN, stroke, PVD, CA, renal disorder, tobacco use   OT comments  [name] was seen for OT treatment on this date. Upon arrival to room pt awake/alert, seated in room recliner. Pt denies pain and is agreeable to OT tx. Pt endorses desire to "put my teeth in" and agreeable to additional standing grooming tasks at sink. OT facilitated ADL tasks as described below. Pt requires variable assist t/o standing grooming tasks including oral care, face washing, and hair combing. He intermittently requries MIN A to maintain static standing balance at sink while performing oral care and hair combing. Cues for seated therapeutic rest break required mid-way through task. Close CGA for safety provided t/o session. Pt educated on energy conservation, falls prevention, and safety t/o session. He return demonstrates understanding of education provided with min cueing during session. Pt making good progress toward goals and continues to benefit from skilled OT services to maximize return to PLOF and minimize risk of future falls, injury, caregiver burden, and readmission. Will continue to follow POC. Discharge recommendation remains appropriate.     Recommendations for follow up therapy are one component of a multi-disciplinary discharge planning process, led by the attending physician.   Recommendations may be updated based on patient status, additional functional criteria and insurance authorization.    Follow Up Recommendations  Home health OT    Assistance Recommended at Discharge Intermittent Supervision/Assistance  Patient can return home with the following  Assistance with cooking/housework;Assistance with feeding;Direct supervision/assist for medications management;Direct supervision/assist for financial management;Assist for transportation;Help with stairs or ramp for entrance;A little help with walking and/or transfers;A little help with bathing/dressing/bathroom   Equipment Recommendations  None recommended by OT    Recommendations for Other Services      Precautions / Restrictions Precautions Precautions: Fall Restrictions Weight Bearing Restrictions: No       Mobility Bed Mobility Overal bed mobility: Needs Assistance Bed Mobility: Sit to Supine       Sit to supine: Supervision        Transfers Overall transfer level: Needs assistance Equipment used: Rolling walker (2 wheels) Transfers: Sit to/from Stand Sit to Stand: Min assist           General transfer comment: MIN A for STS from room recliner and EOB this date. Close CGA for functional mobility in room.     Balance Overall balance assessment: Needs assistance Sitting-balance support: Feet supported Sitting balance-Leahy Scale: Good Sitting balance - Comments: supervision sitting EOB   Standing balance support: During functional activity, No upper extremity supported Standing balance-Leahy Scale: Fair Standing balance comment: Intermittent support required to maintain standing balance without UE support.                           ADL either performed or assessed with clinical judgement   ADL Overall ADL's : Needs assistance/impaired  Grooming: Wash/dry face;Oral care;Brushing hair;With adaptive equipment;Minimal assistance;Min guard;Standing Grooming Details  (indicate cue type and reason): Pt requires variable assist t/o standing grooming tasks this date. He intermittently requries MIN A to maintain static standing balance at sink while performing oral care and hair combing. Cues for seated therapeutic rest break required mid-way through task. Close CGA for safety provided t/o session.                             Functional mobility during ADLs: Minimal assistance;Rolling walker (2 wheels) General ADL Comments: MIN A for STS attempts from room recliner/EOB. cueing for safe use of RW t/o session.    Extremity/Trunk Assessment Upper Extremity Assessment Upper Extremity Assessment: Generalized weakness   Lower Extremity Assessment Lower Extremity Assessment: Generalized weakness        Vision Patient Visual Report: No change from baseline     Perception     Praxis      Cognition Arousal/Alertness: Awake/alert Behavior During Therapy: Flat affect Overall Cognitive Status: Within Functional Limits for tasks assessed                                 General Comments: Pt mildly flat t/o session, but eager to participate and follows VCs consistently. Min cueing for sequencing of multi-step tasks.        Exercises Other Exercises Other Exercises: OT facilitates bed/functional mobility and UB ADL management as described above. See ADL section for additional details. Education on falls prevention and safety provided t/o session.    Shoulder Instructions       General Comments      Pertinent Vitals/ Pain       Pain Assessment Pain Assessment: No/denies pain  Home Living Family/patient expects to be discharged to:: Private residence Living Arrangements: Spouse/significant other Available Help at Discharge: Personal care attendant;Available PRN/intermittently Type of Home: House Home Access: Ramped entrance     Home Layout: One level                          Prior Functioning/Environment               Frequency  Min 2X/week        Progress Toward Goals  OT Goals(current goals can now be found in the care plan section)  Progress towards OT goals: Progressing toward goals  Acute Rehab OT Goals Patient Stated Goal: To put in my teeth OT Goal Formulation: With patient Time For Goal Achievement: 07/09/21 Potential to Achieve Goals: [facility] Frequency remains appropriate;Discharge plan needs to be updated    Co-evaluation                 AM-PAC OT "6 Clicks" Daily Activity     Outcome Measure   Help from another person eating meals?: A Little Help from another person taking care of personal grooming?: A Little Help from another person toileting, which includes using toliet, bedpan, or urinal?: A Little Help from another person bathing (including washing, rinsing, drying)?: A Little Help from another person to put on and taking off regular upper body clothing?: A Little Help from another person to put on and taking off regular lower body clothing?: A Little 6 Click Score: 18    End of Session Equipment Utilized During Treatment: Gait belt;Rolling walker (2 wheels)  OT Visit Diagnosis:  Muscle weakness (generalized) (M62.81);Unsteadiness on feet (R26.81);Other symptoms and signs involving the nervous system (R29.898);Other symptoms and signs involving cognitive function   Activity Tolerance Patient tolerated treatment well   Patient Left with call bell/phone within reach;in bed;with bed alarm set   Nurse Communication Other (comment) (Pt requests sprite at end of session. OT provided diet lemon lime soda with RN clearance.)        Time: 3735-7897 OT Time Calculation (min): 33 min  Charges: OT General Charges $OT Visit: 1 Visit OT Treatments $Therapeutic Activity: 23-37 mins  Shara Blazing, M.S., OTR/L Feeding Team - [facility] Ascom: [phone] 06/29/21, 3:35 PM

## 2021-06-29 NOTE — Progress Notes (Signed)
Physical Therapy Treatment Patient Details Name: Phillip Reyes MRN: [id number] DOB: 12-24-1940 Today's Date: 06/29/2021   History of Present Illness Pt is an 81 y/o M admitted on 06/22/21 after presenting to the ED with c/o SOB. Pt required emergent mechanical intubation & was extubated on 06/24/21. Pt is being treated for acute on chronic respiratory failure possibly 2/2 aspriation PNA although CHF could be a contributing factor. Pt also with acute encephalopathy. Of note, pt was recently hospitalized at the [facility] for 1 month with ARF in the setting of CHF requiring aggressive diuresis but signed out [facility] 2/14. PMH: arthritis, COPD, DM2, HTN, stroke, PVD, CA, renal disorder, tobacco use    PT Comments    Pt was pleasant and motivated to participate during the session and put forth good effort throughout. Pt steady with amb with slow cadence and short B step length. Pt able to stand without physical assist with min verbal cues for increased trunk flexion and demonstrated good eccentric and concentric control.  No adverse symptoms and with SpO2 and HR WNL on room air.  Pt will benefit from HHPT upon discharge to safely address deficits listed in patient problem list for decreased caregiver assistance and eventual return to PLOF.      Recommendations for follow up therapy are one component of a multi-disciplinary discharge planning process, led by the attending physician.  Recommendations may be updated based on patient status, additional functional criteria and insurance authorization.  Follow Up Recommendations  Home health PT     Assistance Recommended at Discharge Frequent or constant Supervision/Assistance  Patient can return home with the following Assistance with cooking/housework;Direct supervision/assist for financial management;Assist for transportation;Help with stairs or ramp for entrance;Direct supervision/assist for medications management;A little help with bathing/dressing/bathroom;A  little help with walking and/or transfers   Equipment Recommendations  Rolling walker (2 wheels)    Recommendations for Other Services       Precautions / Restrictions Precautions Precautions: Fall Restrictions Weight Bearing Restrictions: No     Mobility  Bed Mobility               General bed mobility comments: NT, pt in recliner    Transfers Overall transfer level: Needs assistance Equipment used: Rolling walker (2 wheels) Transfers: Sit to/from Stand Sit to Stand: Supervision           General transfer comment: Good eccentric and concentric control and stabilty with min verbal cues for hand placement    Ambulation/Gait Ambulation/Gait assistance: Supervision Gait Distance (Feet): 150 Feet Assistive device: Rolling walker (2 wheels) Gait Pattern/deviations: Decreased step length - right, Decreased step length - left, Decreased stride length Gait velocity: decreased     General Gait Details: Very slow cadence but steady without LOB   Stairs             Wheelchair Mobility    Modified Rankin (Stroke Patients Only)       Balance Overall balance assessment: Needs assistance   Sitting balance-Leahy Scale: Good     Standing balance support: During functional activity, Bilateral upper extremity supported Standing balance-Leahy Scale: Fair                              Cognition Arousal/Alertness: Awake/alert Behavior During Therapy: Flat affect Overall Cognitive Status: Within Functional Limits for tasks assessed  Exercises Total Joint Exercises Ankle Circles/Pumps: Strengthening, Both, 10 reps (with resistance) Towel Squeeze: Strengthening, Both, 10 reps Long Arc Quad: Strengthening, Both, 10 reps (with manual resistance) Knee Flexion: Strengthening, Both, 10 reps (with manual resistance)    General Comments        Pertinent Vitals/Pain Pain Assessment Pain  Assessment: No/denies pain    Home Living Family/patient expects to be discharged to:: Private residence Living Arrangements: Spouse/significant other Available Help at Discharge: Personal care attendant;Available PRN/intermittently Type of Home: House Home Access: Ramped entrance       Home Layout: One level        Prior Function            PT Goals (current goals can now be found in the care plan section) Progress towards PT goals: Progressing toward goals    Frequency    Min 2X/week      PT Plan Discharge plan needs to be updated    Co-evaluation              AM-PAC PT "6 Clicks" Mobility   Outcome Measure  Help needed turning from your back to your side while in a flat bed without using bedrails?: None Help needed moving from lying on your back to sitting on the side of a flat bed without using bedrails?: None Help needed moving to and from a bed to a chair (including a wheelchair)?: A Little Help needed standing up from a chair using your arms (e.g., wheelchair or bedside chair)?: A Little Help needed to walk in hospital room?: A Little Help needed climbing 3-5 steps with a railing? : A Little 6 Click Score: 20    End of Session Equipment Utilized During Treatment: Gait belt Activity Tolerance: Patient tolerated treatment well Patient left: in chair;with chair alarm set;with call bell/phone within reach Nurse Communication: Mobility status;Other (comment) (chair alarm not functioning properly) PT Visit Diagnosis: Unsteadiness on feet (R26.81);Muscle weakness (generalized) (M62.81);Difficulty in walking, not elsewhere classified (R26.2)     Time: 1105-1130 PT Time Calculation (min) (ACUTE ONLY): 25 min  Charges:  $Gait Training: 8-22 mins $Therapeutic Exercise: 8-22 mins                     D. Scott Eytan Carrigan PT, DPT 06/29/21, 11:57 AM

## 2021-06-29 NOTE — Progress Notes (Signed)
[facility]  ROUNDING NOTE   Subjective:   Patient seen sitting up in the chair, preparing to work with physical therapy Poor appetite remains Denies shortness of breath  Renal function slightly improved Urine output of 1.2 L recorded in past 24 hours  Objective:  Vital signs in last 24 hours:  Temp:  [98 F (36.7 C)-98.9 F (37.2 C)] 98.4 F (36.9 C) (02/22 1149) Pulse Rate:  [59-60] 59 (02/22 1149) Resp:  [16-17] 16 (02/22 1149) BP: (134-156)/(55-76) 155/58 (02/22 1149) SpO2:  [97 %-100 %] 100 % (02/22 1149) Weight:  [79.8 kg] 79.8 kg (02/22 0404)  Weight change:  Filed Weights   06/24/21 0354 06/25/21 0339 06/29/21 0404  Weight: 77.7 kg 75 kg 79.8 kg    Intake/Output: I/O last 3 completed shifts: In: 1576 [I.V.:1074; IV Piggyback:502] Out: 7078 [Urine:1325]   Intake/Output this shift:  Total I/O In: 0  Out: 250 [Urine:250]  Physical Exam: General: NAD, seated in chair  Head: Normocephalic, atraumatic. Moist oral mucosal membranes  Eyes: Anicteric  Lungs:  Clear to auscultation, normal effort  Heart: Regular rate and rhythm  Abdomen:  Soft, nontender  Extremities:  trace peripheral edema.  Neurologic: Nonfocal, moving all four extremities  Skin: No lesions       Basic Metabolic Panel: Recent Labs  Lab 06/23/21 0315 06/24/21 0409 06/25/21 0456 06/26/21 0412 06/27/21 0616 06/28/21 0523 06/29/21 0317  NA 137 139 139 139 140 142 139  K 4.3 4.5 5.0 4.6 4.4 4.4 4.3  CL 103 105 106 104 107 109 108  CO2 23 25 24 25 25 23 22   GLUCOSE 258* 237* 187* 169* 138* 108* 82  BUN 67* 78* 86* 89* 71* 59* 46*  CREATININE 4.10* 4.14* 4.20* 4.15* 3.62* 3.30* 3.17*  CALCIUM 8.2* 8.0* 8.1* 8.1* 8.3* 8.3* 8.0*  MG 2.3 2.3 2.4 2.4 2.4  --   --   PHOS 4.2  --  5.9* 5.4* 4.4 4.3 3.8     Liver Function Tests: Recent Labs  Lab 06/28/21 0523 06/29/21 0317  ALBUMIN 3.0* 2.7*    No results for input(s): LIPASE, AMYLASE in the last 168 hours. No  results for input(s): AMMONIA in the last 168 hours.  CBC: Recent Labs  Lab 06/25/21 0456 06/26/21 0412 06/27/21 0616 06/28/21 0523 06/29/21 0317  WBC 18.7* 13.6* 11.1* 10.4 8.5  NEUTROABS  --  10.1*  --  6.9  --   HGB 9.5* 9.0* 9.2* 9.7* 8.9*  HCT 29.1* 28.0* 27.6* 29.6* 27.1*  MCV 95.7 95.6 94.5 95.2 95.1  PLT 188 183 181 185 169     Cardiac Enzymes: No results for input(s): CKTOTAL, CKMB, CKMBINDEX, TROPONINI in the last 168 hours.  BNP: Invalid input(s): POCBNP  CBG: Recent Labs  Lab 06/28/21 1214 06/28/21 1620 06/28/21 2107 06/29/21 0815 06/29/21 1218  GLUCAP 136* 154* 167* 99 161*     Microbiology: Results for orders placed or performed during the hospital encounter of 06/22/21  Blood culture (routine x 2)     Status: None   Collection Time: 06/22/21  2:43 PM   Specimen: BLOOD  Result Value Ref Range Status   Specimen Description BLOOD BLOOD RIGHT HAND  Final   Special Requests   Final    BOTTLES DRAWN AEROBIC AND ANAEROBIC Blood Culture adequate volume   Culture   Final    NO GROWTH 5 DAYS Performed at [facility], [address] [place], [place] [address]    Report Status 06/27/2021 FINAL  Final  Resp Panel by RT-PCR (Flu A&B, Covid) Nasopharyngeal Swab     Status: None   Collection Time: 06/22/21  3:10 PM   Specimen: Nasopharyngeal Swab; Nasopharyngeal(NP) swabs in vial transport medium  Result Value Ref Range Status   SARS Coronavirus 2 by RT PCR NEGATIVE NEGATIVE Final    Comment: (NOTE) SARS-CoV-2 target nucleic acids are NOT DETECTED.  The SARS-CoV-2 RNA is generally detectable in upper respiratory specimens during the acute phase of infection. The lowest concentration of SARS-CoV-2 viral copies this assay can detect is 138 copies/mL. A negative result does not preclude SARS-Cov-2 infection and should not be used as the sole basis for treatment or other patient management decisions. A negative result may occur with  improper  specimen collection/handling, submission of specimen other than nasopharyngeal swab, presence of viral mutation(s) within the areas targeted by this assay, and inadequate number of viral copies(<138 copies/mL). A negative result must be combined with clinical observations, patient history, and epidemiological information. The expected result is Negative.  Fact Sheet for Patients:  EntrepreneurPulse.com.au  Fact Sheet for Healthcare Providers:  IncredibleEmployment.be  This test is no t yet approved or cleared by the [place] FDA and  has been authorized for detection and/or diagnosis of SARS-CoV-2 by FDA under an Emergency Use Authorization (EUA). This EUA will remain  in effect (meaning this test can be used) for the duration of the COVID-19 declaration under Section 564(b)(1) of the Act, 21 U.S.C.section 360bbb-3(b)(1), unless the authorization is terminated  or revoked sooner.       Influenza A by PCR NEGATIVE NEGATIVE Final   Influenza B by PCR NEGATIVE NEGATIVE Final    Comment: (NOTE) The Xpert Xpress SARS-CoV-2/FLU/RSV plus assay is intended as an aid in the diagnosis of influenza from Nasopharyngeal swab specimens and should not be used as a sole basis for treatment. Nasal washings and aspirates are unacceptable for Xpert Xpress SARS-CoV-2/FLU/RSV testing.  Fact Sheet for Patients: EntrepreneurPulse.com.au  Fact Sheet for Healthcare Providers: IncredibleEmployment.be  This test is not yet approved or cleared by the [place] FDA and has been authorized for detection and/or diagnosis of SARS-CoV-2 by FDA under an Emergency Use Authorization (EUA). This EUA will remain in effect (meaning this test can be used) for the duration of the COVID-19 declaration under Section 564(b)(1) of the Act, 21 U.S.C. section 360bbb-3(b)(1), unless the authorization is terminated or revoked.  Performed at  [facility], [address] [place], [place] [address]   Blood culture (routine x 2)     Status: None   Collection Time: 06/22/21  3:45 PM   Specimen: BLOOD  Result Value Ref Range Status   Specimen Description BLOOD LEFT ANTECUBITAL  Final   Special Requests   Final    BOTTLES DRAWN AEROBIC AND ANAEROBIC Blood Culture adequate volume   Culture   Final    NO GROWTH 5 DAYS Performed at [facility], [address] [place], [place] [address]    Report Status 06/27/2021 FINAL  Final  MRSA Next Gen by PCR, Nasal     Status: None   Collection Time: 06/22/21  5:37 PM   Specimen: Nasal Mucosa; Nasal Swab  Result Value Ref Range Status   MRSA by PCR Next Gen NOT DETECTED NOT DETECTED Final    Comment: (NOTE) The GeneXpert MRSA Assay (FDA approved for NASAL specimens only), is one component of a comprehensive MRSA colonization surveillance program. It is not intended to diagnose MRSA infection nor to  guide or monitor treatment for MRSA infections. Test performance is not FDA approved in patients less than 35 years old. Performed at [facility], [address], [place], [place] [address]   Urine Culture     Status: Abnormal   Collection Time: 06/24/21 12:14 PM   Specimen: Urine, Random  Result Value Ref Range Status   Specimen Description   Final    URINE, RANDOM Performed at [facility], [address] [place], [place] [address]    Special Requests   Final    NONE Performed at [facility], [address] [place], [place] [address]    Culture >=100,000 COLONIES/mL ENTEROCOCCUS FAECALIS (A)  Final   Report Status 06/27/2021 FINAL  Final   Organism ID, Bacteria ENTEROCOCCUS FAECALIS (A)  Final      Susceptibility   Enterococcus faecalis - MIC*    AMPICILLIN <=2 SENSITIVE Sensitive     NITROFURANTOIN <=16 SENSITIVE Sensitive     VANCOMYCIN 1 SENSITIVE Sensitive     * >=100,000 COLONIES/mL ENTEROCOCCUS FAECALIS     Coagulation Studies: No results for input(s): LABPROT, INR in the last 72 hours.  Urinalysis: No results for input(s): COLORURINE, LABSPEC, PHURINE, GLUCOSEU, HGBUR, BILIRUBINUR, KETONESUR, PROTEINUR, UROBILINOGEN, NITRITE, LEUKOCYTESUR in the last 72 hours.  Invalid input(s): APPERANCEUR    Imaging: No results found.   Medications:    sodium chloride Stopped (06/26/21 0039)    arformoterol  15 mcg Nebulization BID   aspirin EC  81 mg Oral Daily   atorvastatin  80 mg Per Tube QHS   budesonide (PULMICORT) nebulizer solution  0.25 mg Nebulization BID   Chlorhexidine Gluconate Cloth  6 each Topical Daily   cholecalciferol  2,000 Units Oral Daily   citalopram  20 mg Oral Daily   feeding supplement (NEPRO CARB STEADY)  237 mL Oral TID BM   heparin  5,000 Units Subcutaneous Q8H   insulin aspart  0-15 Units Subcutaneous TID AC & HS   insulin glargine-yfgn  5 Units Subcutaneous Daily   multivitamin with minerals  1 tablet Oral Daily   pantoprazole  40 mg Oral Daily   revefenacin  175 mcg Nebulization Daily   tamsulosin  0.8 mg Oral QHS   vitamin B-12  1,000 mcg Oral Daily   albuterol, azelastine, docusate, food thickener, polyethylene glycol  Assessment/ Plan:  Mr. Phillip Reyes is a 81 y.o.  male with a PMHx of diabetes mellitus type 2, hypertension, CVA, peripheral vascular disease, history of chronic kidney disease stage IV, osteoarthritis, prostate cancer, anemia of chronic kidney disease, who was admitted to [facility] on 06/22/2021 for evaluation of increasing shortness of breath.   Diabetes mellitus type 2 with chronic kidney disease/acute kidney injury/chronic kidney disease stage IV baseline creatinine 3.6.  Suspect acute kidney injury now related to concurrent illness.   Renal function continues to improve beyond baseline.  Adequate urine output recorded.  Recommend follow-up with [facility] nephrology once discharged.    Lab Results  Component Value Date   CREATININE 3.17 (H)  06/29/2021   CREATININE 3.30 (H) 06/28/2021   CREATININE 3.62 (H) 06/27/2021    Intake/Output Summary (Last 24 hours) at 06/29/2021 1521 Last data filed at 06/29/2021 1441 Gross per 24 hour  Intake 437.53 ml  Output 600 ml  Net -162.47 ml    2.  Acute respiratory failure.  Defer decisions regarding respiratory status and intervention to pulmonary/critical care.  Resolved  3.  Anemia of chronic kidney disease.  Hemoglobin 8.9.  We will continue to monitor   LOS: 7 [place] 2/22/20233:21 PM

## 2021-09-14 ENCOUNTER — Emergency Department: Payer: No Typology Code available for payment source

## 2021-09-14 ENCOUNTER — Inpatient Hospital Stay
Admission: EM | Admit: 2021-09-14 | Discharge: 2021-09-23 | DRG: 480 | Disposition: A | Discharge status: Skilled Nursing Facility | Payer: No Typology Code available for payment source | Attending: Internal Medicine | Admitting: Internal Medicine

## 2021-09-14 ENCOUNTER — Encounter: Payer: Self-pay | Admitting: Emergency Medicine

## 2021-09-14 ENCOUNTER — Other Ambulatory Visit: Payer: Self-pay

## 2021-09-14 DIAGNOSIS — Z79899 Other long term (current) drug therapy: Secondary | ICD-10-CM | POA: Diagnosis not present

## 2021-09-14 DIAGNOSIS — R1310 Dysphagia, unspecified: Secondary | ICD-10-CM | POA: Diagnosis present

## 2021-09-14 DIAGNOSIS — Z20822 Contact with and (suspected) exposure to covid-19: Secondary | ICD-10-CM | POA: Diagnosis present

## 2021-09-14 DIAGNOSIS — F17210 Nicotine dependence, cigarettes, uncomplicated: Secondary | ICD-10-CM | POA: Diagnosis present

## 2021-09-14 DIAGNOSIS — Z794 Long term (current) use of insulin: Secondary | ICD-10-CM | POA: Diagnosis not present

## 2021-09-14 DIAGNOSIS — Y92009 Unspecified place in unspecified non-institutional (private) residence as the place of occurrence of the external cause: Secondary | ICD-10-CM

## 2021-09-14 DIAGNOSIS — D62 Acute posthemorrhagic anemia: Secondary | ICD-10-CM | POA: Diagnosis not present

## 2021-09-14 DIAGNOSIS — Z7982 Long term (current) use of aspirin: Secondary | ICD-10-CM

## 2021-09-14 DIAGNOSIS — N179 Acute kidney failure, unspecified: Secondary | ICD-10-CM | POA: Diagnosis not present

## 2021-09-14 DIAGNOSIS — J440 Chronic obstructive pulmonary disease with acute lower respiratory infection: Secondary | ICD-10-CM | POA: Diagnosis not present

## 2021-09-14 DIAGNOSIS — J189 Pneumonia, unspecified organism: Secondary | ICD-10-CM | POA: Diagnosis not present

## 2021-09-14 DIAGNOSIS — N184 Chronic kidney disease, stage 4 (severe): Secondary | ICD-10-CM | POA: Diagnosis present

## 2021-09-14 DIAGNOSIS — E1122 Type 2 diabetes mellitus with diabetic chronic kidney disease: Secondary | ICD-10-CM | POA: Diagnosis present

## 2021-09-14 DIAGNOSIS — I10 Essential (primary) hypertension: Secondary | ICD-10-CM | POA: Diagnosis present

## 2021-09-14 DIAGNOSIS — E875 Hyperkalemia: Secondary | ICD-10-CM

## 2021-09-14 DIAGNOSIS — S72141A Displaced intertrochanteric fracture of right femur, initial encounter for closed fracture: Principal | ICD-10-CM | POA: Diagnosis present

## 2021-09-14 DIAGNOSIS — W010XXA Fall on same level from slipping, tripping and stumbling without subsequent striking against object, initial encounter: Secondary | ICD-10-CM | POA: Diagnosis present

## 2021-09-14 DIAGNOSIS — I5022 Chronic systolic (congestive) heart failure: Secondary | ICD-10-CM | POA: Diagnosis present

## 2021-09-14 DIAGNOSIS — R410 Disorientation, unspecified: Secondary | ICD-10-CM | POA: Diagnosis not present

## 2021-09-14 DIAGNOSIS — I130 Hypertensive heart and chronic kidney disease with heart failure and stage 1 through stage 4 chronic kidney disease, or unspecified chronic kidney disease: Secondary | ICD-10-CM | POA: Diagnosis present

## 2021-09-14 DIAGNOSIS — S728X1A Other fracture of right femur, initial encounter for closed fracture: Secondary | ICD-10-CM | POA: Diagnosis present

## 2021-09-14 DIAGNOSIS — J449 Chronic obstructive pulmonary disease, unspecified: Secondary | ICD-10-CM | POA: Diagnosis present

## 2021-09-14 DIAGNOSIS — D649 Anemia, unspecified: Secondary | ICD-10-CM

## 2021-09-14 DIAGNOSIS — Z7951 Long term (current) use of inhaled steroids: Secondary | ICD-10-CM | POA: Diagnosis not present

## 2021-09-14 DIAGNOSIS — E119 Type 2 diabetes mellitus without complications: Secondary | ICD-10-CM

## 2021-09-14 DIAGNOSIS — Z8249 Family history of ischemic heart disease and other diseases of the circulatory system: Secondary | ICD-10-CM

## 2021-09-14 DIAGNOSIS — S72001A Fracture of unspecified part of neck of right femur, initial encounter for closed fracture: Principal | ICD-10-CM

## 2021-09-14 DIAGNOSIS — R509 Fever, unspecified: Secondary | ICD-10-CM

## 2021-09-14 DIAGNOSIS — Z8673 Personal history of transient ischemic attack (TIA), and cerebral infarction without residual deficits: Secondary | ICD-10-CM | POA: Diagnosis not present

## 2021-09-14 LAB — CBC WITH DIFFERENTIAL/PLATELET
Abs Immature Granulocytes: 0.03 10*3/uL (ref 0.00–0.07)
Basophils Absolute: 0.1 10*3/uL (ref 0.0–0.1)
Basophils Relative: 1 %
Eosinophils Absolute: 0.4 10*3/uL (ref 0.0–0.5)
Eosinophils Relative: 4 %
HCT: 30.2 % — ABNORMAL LOW (ref 39.0–52.0)
Hemoglobin: 9.6 g/dL — ABNORMAL LOW (ref 13.0–17.0)
Immature Granulocytes: 0 %
Lymphocytes Relative: 13 %
Lymphs Abs: 1.2 10*3/uL (ref 0.7–4.0)
MCH: 30.6 pg (ref 26.0–34.0)
MCHC: 31.8 g/dL (ref 30.0–36.0)
MCV: 96.2 fL (ref 80.0–100.0)
Monocytes Absolute: 0.8 10*3/uL (ref 0.1–1.0)
Monocytes Relative: 9 %
Neutro Abs: 6.7 10*3/uL (ref 1.7–7.7)
Neutrophils Relative %: 73 %
Platelets: 166 10*3/uL (ref 150–400)
RBC: 3.14 MIL/uL — ABNORMAL LOW (ref 4.22–5.81)
RDW: 13.2 % (ref 11.5–15.5)
WBC: 9.2 10*3/uL (ref 4.0–10.5)
nRBC: 0 % (ref 0.0–0.2)

## 2021-09-14 LAB — BASIC METABOLIC PANEL
Anion gap: 3 — ABNORMAL LOW (ref 5–15)
BUN: 61 mg/dL — ABNORMAL HIGH (ref 8–23)
CO2: 21 mmol/L — ABNORMAL LOW (ref 22–32)
Calcium: 7.9 mg/dL — ABNORMAL LOW (ref 8.9–10.3)
Chloride: 114 mmol/L — ABNORMAL HIGH (ref 98–111)
Creatinine, Ser: 3.53 mg/dL — ABNORMAL HIGH (ref 0.61–1.24)
GFR, Estimated: 17 mL/min — ABNORMAL LOW (ref >60)
Glucose, Bld: 101 mg/dL — ABNORMAL HIGH (ref 70–99)
Potassium: 5.7 mmol/L — ABNORMAL HIGH (ref 3.5–5.1)
Sodium: 138 mmol/L (ref 135–145)

## 2021-09-14 LAB — SAMPLE TO BLOOD BANK

## 2021-09-14 LAB — GLUCOSE, CAPILLARY: Glucose-Capillary: 131 mg/dL — ABNORMAL HIGH (ref 70–99)

## 2021-09-14 MED ORDER — TRAZODONE HCL 50 MG PO TABS
50.0000 mg | ORAL_TABLET | Freq: Every day | ORAL | Status: DC
  Administered 2021-09-15 – 2021-09-22 (×8): 50 mg via ORAL
  Filled 2021-09-14 (×8): qty 1

## 2021-09-14 MED ORDER — MORPHINE SULFATE (PF) 2 MG/ML IV SOLN
2.0000 mg | Freq: Once | INTRAVENOUS | Status: AC
  Administered 2021-09-14: 2 mg via INTRAVENOUS
  Filled 2021-09-14: qty 1

## 2021-09-14 MED ORDER — FENTANYL CITRATE PF 50 MCG/ML IJ SOSY
50.0000 ug | PREFILLED_SYRINGE | Freq: Once | INTRAMUSCULAR | Status: AC
  Administered 2021-09-14: 50 ug via INTRAVENOUS
  Filled 2021-09-14: qty 1

## 2021-09-14 MED ORDER — INSULIN ASPART 100 UNIT/ML IJ SOLN
0.0000 [IU] | Freq: Three times a day (TID) | INTRAMUSCULAR | Status: DC
  Administered 2021-09-15: 5 [IU] via SUBCUTANEOUS
  Administered 2021-09-15 – 2021-09-16 (×2): 3 [IU] via SUBCUTANEOUS
  Administered 2021-09-16: 2 [IU] via SUBCUTANEOUS
  Administered 2021-09-16 – 2021-09-17 (×4): 3 [IU] via SUBCUTANEOUS
  Administered 2021-09-18: 2 [IU] via SUBCUTANEOUS
  Administered 2021-09-18 (×2): 3 [IU] via SUBCUTANEOUS
  Administered 2021-09-19: 5 [IU] via SUBCUTANEOUS
  Administered 2021-09-19 (×2): 3 [IU] via SUBCUTANEOUS
  Administered 2021-09-20 (×2): 2 [IU] via SUBCUTANEOUS
  Administered 2021-09-21: 3 [IU] via SUBCUTANEOUS
  Administered 2021-09-21 (×2): 2 [IU] via SUBCUTANEOUS
  Administered 2021-09-22 – 2021-09-23 (×4): 3 [IU] via SUBCUTANEOUS
  Administered 2021-09-23: 2 [IU] via SUBCUTANEOUS
  Filled 2021-09-14 (×23): qty 1

## 2021-09-14 MED ORDER — MOMETASONE FURO-FORMOTEROL FUM 200-5 MCG/ACT IN AERO
2.0000 | INHALATION_SPRAY | Freq: Two times a day (BID) | RESPIRATORY_TRACT | Status: DC
  Administered 2021-09-16 – 2021-09-23 (×14): 2 via RESPIRATORY_TRACT
  Filled 2021-09-14: qty 8.8

## 2021-09-14 MED ORDER — MORPHINE SULFATE (PF) 2 MG/ML IV SOLN
2.0000 mg | INTRAVENOUS | Status: DC | PRN
  Administered 2021-09-14: 2 mg via INTRAVENOUS
  Filled 2021-09-14: qty 1

## 2021-09-14 MED ORDER — SODIUM POLYSTYRENE SULFONATE 15 GM/60ML PO SUSP
30.0000 g | Freq: Once | ORAL | Status: AC
  Administered 2021-09-14: 30 g via ORAL
  Filled 2021-09-14: qty 120

## 2021-09-14 MED ORDER — TAMSULOSIN HCL 0.4 MG PO CAPS
0.8000 mg | ORAL_CAPSULE | Freq: Every day | ORAL | Status: DC
  Administered 2021-09-14 – 2021-09-22 (×9): 0.8 mg via ORAL
  Filled 2021-09-14 (×9): qty 2

## 2021-09-14 MED ORDER — PANTOPRAZOLE SODIUM 40 MG PO TBEC
40.0000 mg | DELAYED_RELEASE_TABLET | Freq: Every day | ORAL | Status: DC
  Administered 2021-09-16 – 2021-09-23 (×8): 40 mg via ORAL
  Filled 2021-09-14 (×8): qty 1

## 2021-09-14 MED ORDER — CEFAZOLIN SODIUM-DEXTROSE 1-4 GM/50ML-% IV SOLN
1.0000 g | INTRAVENOUS | Status: AC
  Administered 2021-09-15: 1 g via INTRAVENOUS

## 2021-09-14 MED ORDER — CITALOPRAM HYDROBROMIDE 10 MG PO TABS
20.0000 mg | ORAL_TABLET | Freq: Every day | ORAL | Status: DC
  Administered 2021-09-16 – 2021-09-23 (×8): 20 mg via ORAL
  Filled 2021-09-14 (×7): qty 2
  Filled 2021-09-14: qty 1
  Filled 2021-09-14: qty 2

## 2021-09-14 MED ORDER — HYDROCODONE-ACETAMINOPHEN 5-325 MG PO TABS
1.0000 | ORAL_TABLET | ORAL | Status: DC | PRN
Start: 2021-09-14 — End: 2021-09-14

## 2021-09-14 MED ORDER — INSULIN ASPART 100 UNIT/ML IJ SOLN
0.0000 [IU] | Freq: Every day | INTRAMUSCULAR | Status: DC
  Administered 2021-09-21: 2 [IU] via SUBCUTANEOUS
  Filled 2021-09-14: qty 1

## 2021-09-14 MED ORDER — OXYCODONE-ACETAMINOPHEN 5-325 MG PO TABS
1.0000 | ORAL_TABLET | ORAL | Status: DC | PRN
  Administered 2021-09-14 – 2021-09-15 (×2): 2 via ORAL
  Administered 2021-09-15: 1 via ORAL
  Administered 2021-09-16 (×2): 2 via ORAL
  Administered 2021-09-17: 1 via ORAL
  Administered 2021-09-17: 2 via ORAL
  Filled 2021-09-14 (×4): qty 2
  Filled 2021-09-14: qty 1
  Filled 2021-09-14: qty 2
  Filled 2021-09-14: qty 1

## 2021-09-14 MED ORDER — FENTANYL CITRATE PF 50 MCG/ML IJ SOSY
25.0000 ug | PREFILLED_SYRINGE | INTRAMUSCULAR | Status: DC | PRN
  Administered 2021-09-14 – 2021-09-15 (×4): 25 ug via INTRAVENOUS
  Filled 2021-09-14 (×4): qty 1

## 2021-09-14 NOTE — ED Provider Notes (Signed)
? ?[facility] ?Provider Note ? ? ? Event Date/Time  ? First MD Initiated Contact with Patient 09/14/21 1627   ?  (approximate) ? ? ?History  ? ?Fall ? ? ?HPI ? ?Phillip Reyes is a 81 y.o. male  who, per discharge summary dated 06/29/21 has history of DM, HTN, COPD, who presents to the emergency department today with concern for right hip pain after a fall.  Patient states that he tripped over a concrete block and landed onto his right side.  Had immediate onset of severe right hip pain.  He denies any other injury.  Patient takes aspirin and took his dose this morning. ? ?Physical Exam  ? ?Triage Vital Signs: ?ED Triage Vitals  ?Enc Vitals Group  ?   BP 09/14/21 1534 130/62  ?   Pulse Rate 09/14/21 1534 60  ?   Resp 09/14/21 1534 18  ?   Temp 09/14/21 1534 97.7 ?F (36.5 ?C)  ?   Temp Source 09/14/21 1534 Oral  ?   SpO2 09/14/21 1529 98 %  ?   Weight 09/14/21 1535 160 lb (72.6 kg)  ?   Height 09/14/21 1535 '5\' 10"'$  (1.778 m)  ?   Head Circumference --   ?   Peak Flow --   ?   Pain Score 09/14/21 1534 8  ? ?Most recent vital signs: ?Vitals:  ? 09/14/21 1529 09/14/21 1534  ?BP:  130/62  ?Pulse:  60  ?Resp:  18  ?Temp:  97.7 ?F (36.5 ?C)  ?SpO2: 98% 97%  ? ? ?General: Awake, alert and oriented ?CV:  Good peripheral perfusion. Regular rate and rhythm. ?Resp:  Normal effort. Lungs clear to auscultation ?Abd:  No distention.  ?MSK:  Right leg shortened and externally rotated. Tender to palpation and manipulation of the right hip. NV intact distally. ? ?ED Results / Procedures / Treatments  ? ?Labs ?(all labs ordered are listed, but only abnormal results are displayed) ?Labs Reviewed  ?CBC WITH DIFFERENTIAL/PLATELET  ?BASIC METABOLIC PANEL  ?SAMPLE TO BLOOD BANK  ? ? ? ?EKG ? ?INance Pear, attending physician, personally viewed and interpreted this EKG ? ?EKG Time: 1531 ?Rate: 60 ?Rhythm: sinus rhythm with first degree av block ?Axis: left axis deviation ?Intervals: qtc 449 ?QRS: LBBB ?ST changes:  no st elevation ?Impression: abnormal ekg ? ? ?RADIOLOGY ?I independently interpreted and visualized the right hip x-ray. My interpretation: Right intertrochanteric fracture ?Radiology interpretation:  ?IMPRESSION:  ?Comminuted displaced intertrochanteric fracture is seen in the  ?proximal right femur.  ? ?PROCEDURES: ? ?Critical Care performed: No ? ?Procedures ? ? ?MEDICATIONS ORDERED IN ED: ?Medications  ?fentaNYL (SUBLIMAZE) injection 50 mcg (has no administration in time range)  ? ? ? ?IMPRESSION / MDM / ASSESSMENT AND PLAN / ED COURSE  ?I reviewed the triage vital signs and the nursing notes. ?             ?               ? ?Differential diagnosis includes, but is not limited to, right hip fracture right hip dislocation trochanteric bursitis. ? ?Patient presented to the emergency department today because of concerns for right hip pain after a fall.  On exam right leg is shortened and externally rotated.  Patient does have tenderness to the right hip.  X-rays consistent with right intertrochanteric fracture.  Discussed with Dr. Rudene Christians with orthopedic surgery.  Discussed with Dr.Lai With the hospitalist service will plan on admission. ? ?FINAL  CLINICAL IMPRESSION(S) / ED DIAGNOSES  ? ?Final diagnoses:  ?Closed fracture of right hip, initial encounter ([place])  ? ? ? ?Note:  This document was prepared using Dragon voice recognition software and may include unintentional dictation errors. ? ?  ?Nance Pear, MD ?09/14/21 1832 ? ?

## 2021-09-14 NOTE — Assessment & Plan Note (Addendum)
--  Cr trending up ?

## 2021-09-14 NOTE — Assessment & Plan Note (Signed)
--  hold home amlodipine since BP wnl ?

## 2021-09-14 NOTE — ED Notes (Signed)
Informed RN bed assigned 

## 2021-09-14 NOTE — Assessment & Plan Note (Signed)
--  appeared compensated.  Last LVEF 40 to 45% in Feb 2023. ? ?

## 2021-09-14 NOTE — Assessment & Plan Note (Signed)
--  recent A1c 6.7, well controlled ?--hold home long-acting insulin for now ?--SSI for now ?

## 2021-09-14 NOTE — Assessment & Plan Note (Signed)
--  Hold home ASA  ?--resume home statin after discharge ?

## 2021-09-14 NOTE — Assessment & Plan Note (Addendum)
S/p INTRAMEDULLARY (IM) NAIL on 5/11 ?--from accidental fall.  xray showed Comminuted displaced intertrochanteric fracture is seen in the ?proximal right femur. ?--Ortho consulted, with Dr. Rudene Christians ?Plan: ?--PT/OT ?--SNF rehab ?--Norco PRN for pain, caution over-sedation ?

## 2021-09-14 NOTE — Assessment & Plan Note (Addendum)
--  stable. ?--cont home Symbicort as Dulera ?--cont DuoNeb, flutter valve and chest PT  ?

## 2021-09-14 NOTE — Assessment & Plan Note (Addendum)
--  may be due to CKD4 ?--Kayexalate 30 g x2 ?

## 2021-09-14 NOTE — H&P (Signed)
?History and Physical  ? ? ?Phillip Reyes KDT:[id number] DOB: 05-03-1941 DOA: 09/14/2021 ? ?[facility]P: [facility] [place]  ?Patient coming from: home ? ?I have personally briefly reviewed patient's old medical records in [place] ? ?Chief Complaint: right hip pain ? ?HPI: Phillip Reyes is a 81 y.o. male with medical history significant of systolic CHF, pacemaker presence, COPD, DM2, multiple strokes, CKD4 who presented with right hip pain after a fall. ? ?Pt was in the front yard when he accidentally fell and landed on brick on his right side, with significant pain in his right hip afterwards.  Prior to that, pt was at his baseline.  No dyspnea, no swelling.  Normal oral intake.  Pt reported residual right-sided weakness from his prior strokes, but normally walks without assist device.  [facility] ? ?ED Course: initial vitals all wnl, sating 97% on room air.  Labs notable for potassium 5.7, Cr 3.53 (around recent baseline), CXR no acute finding, xray showed Comminuted displaced intertrochanteric fracture is seen in the ?proximal right femur.  Ortho Dr. Rudene Christians consulted in the ED who plans to take pt for surgery tomorrow. ? ? ?Assessment/Plan ?Principal Problem: ?  Other fracture of right femur, initial encounter for closed fracture ([place]) ?Active Problems: ?  Type 2 diabetes mellitus ([place]) ?  HTN (hypertension) ?  COPD (chronic obstructive pulmonary disease) ([place]) ?  H/O: stroke ?  Chronic systolic CHF (congestive heart failure) ([place]) ?  CKD (chronic kidney disease) stage 4, GFR 15-29 ml/min (HCC) ?  Hyperkalemia ? ?fracture of right femur, initial encounter for closed fracture ([place]) ?--from accidental fall.  xray showed Comminuted displaced intertrochanteric fracture is seen in the ?proximal right femur. ?--Ortho consulted, with Dr. Rudene Christians ?Plan: ?--plan for OR tomorrow ?--Percocet PRN and IV morphine PRN ? ?Hyperkalemia ?--may be due to CKD4 ?--Kayexalate 30 g x1 ? ?CKD (chronic kidney disease)  stage 4, GFR 15-29 ml/min (HCC) ?--Cr at baseline. ? ?Chronic systolic CHF (congestive heart failure) ([place]) ?--appeared compensated.  Last LVEF 40 to 45% in Feb 2023. ? ? ?H/O: stroke ?--Hold home ASA  ?--resume home statin after discharge ? ?COPD (chronic obstructive pulmonary disease) ([place]) ?--stable. ?--cont home Symbicort as Dulera ? ?HTN (hypertension) ?--hold home amlodipine since BP wnl ? ?Type 2 diabetes mellitus ([place]) ?--recent A1c 6.7, well controlled ?--hold home long-acting insulin for now ?--SSI for now ? ? ?DVT prophylaxis: SCD/Compression stockings ?Code Status: Full code  ?Family Communication:   ?Disposition Plan: likely SNF  ?Consults called: ortho ?Level of care: Med-Surg ? ? ?Review of Systems: As per HPI otherwise complete review of systems negative.  ? ?Past Medical History:  ?Diagnosis Date  ? Arthritis   ? Cancer [facility])   ? COPD (chronic obstructive pulmonary disease) ([place])   ? Diabetes mellitus without complication ([place])   ? Hypertension   ? Renal disorder   ? Stroke [facility])   ? ? ?Past Surgical History:  ?Procedure Laterality Date  ? APPENDECTOMY    ? THROAT SURGERY    ? ? ? reports that he has been smoking cigarettes. He has a 30.50 pack-year smoking history. He has never used smokeless tobacco. He reports that he does not drink alcohol and does not use drugs. ? ?Allergies  ?Allergen Reactions  ? Norco [Hydrocodone-Acetaminophen] Shortness Of Breath  ? ? ?Family History  ?Problem Relation Age of Onset  ? Congestive Heart Failure Father   ? Other Mother   ?     unknown  medical history  ? ? ?Prior to Admission medications   ?Medication Sig Start Date End Date Taking? Authorizing Provider  ?acetaminophen (TYLENOL) 325 MG tablet Take 650 mg by mouth every 6 (six) hours as needed.    [provider]  ?amLODipine (NORVASC) 10 MG tablet Take 1 tablet (10 mg total) by mouth daily. 11/11/19   Swayze, Ava, DO  ?aspirin EC 81 MG tablet Take 81 mg by mouth daily.    [provider]   ?atorvastatin (LIPITOR) 80 MG tablet Take 80 mg by mouth at bedtime.    [provider]  ?azelastine (ASTELIN) 0.1 % nasal spray Place 1 spray into both nostrils 2 (two) times daily as needed for rhinitis.    [provider]  ?budesonide-formoterol (SYMBICORT) 160-4.5 MCG/ACT inhaler Inhale 2 puffs into the lungs 2 (two) times daily.    [provider]  ?Cholecalciferol (VITAMIN D) 50 MCG (2000 UT) CAPS Take 1 capsule by mouth daily.    [provider]  ?citalopram (CELEXA) 20 MG tablet Take 20 mg by mouth daily.    [provider]  ?gabapentin (NEURONTIN) 300 MG capsule Take 300 mg by mouth daily.     [provider]  ?insulin glargine (LANTUS) 100 UNIT/ML injection Inject 10 Units into the skin at bedtime.     [provider]  ?insulin regular (NOVOLIN R) 100 units/mL injection Inject 3-10 Units into the skin 3 (three) times daily before meals.    [provider]  ?ipratropium-albuterol (DUONEB) 0.5-2.5 (3) MG/3ML SOLN Take 3 mLs by nebulization every 6 (six) hours as needed.    [provider]  ?omeprazole (PRILOSEC) 20 MG capsule Take 20 mg by mouth daily.    [provider]  ?tamsulosin (FLOMAX) 0.4 MG CAPS capsule Take 0.8 mg by mouth at bedtime.    [provider]  ?traZODone (DESYREL) 50 MG tablet Take 50 mg by mouth at bedtime.    [provider]  ?vitamin B-12 (CYANOCOBALAMIN) 1000 MCG tablet Take 1,000 mcg by mouth daily.    [provider]  ? ? ?Physical Exam: ?Vitals:  ? 09/14/21 1730 09/14/21 1800 09/14/21 1830 09/14/21 1903  ?BP: (!) 142/63 137/61 138/69 133/82  ?Pulse: (!) 59 60 (!) 59 60  ?Resp: 18 15 (!) 22 18  ?Temp:      ?TempSrc:      ?SpO2: 99% 99% 100% 100%  ?Weight:      ?Height:      ? ? ?Constitutional: NAD, AAOx3 ?HEENT: conjunctivae and lids normal, EOMI ?CV: No cyanosis.   ?RESP: normal respiratory effort, on RA ?SKIN: warm, dry ?Neuro: II - XII grossly intact.   ?Psych:  Normal mood and affect.  Appropriate judgement and reason ? ? ?Labs on Admission: I have personally reviewed following labs and imaging studies ? ?CBC: ?Recent Labs  ?Lab 09/14/21 ?1629  ?WBC 9.2  ?NEUTROABS 6.7  ?HGB 9.6*  ?HCT 30.2*  ?MCV 96.2  ?PLT 166  ? ?Basic Metabolic Panel: ?Recent Labs  ?Lab 09/14/21 ?1629  ?NA 138  ?K 5.7*  ?CL 114*  ?CO2 21*  ?GLUCOSE 101*  ?BUN 61*  ?CREATININE 3.53*  ?CALCIUM 7.9*  ? ?GFR: ?Estimated Creatinine Clearance: 17.1 mL/min (A) (by C-G formula based on SCr of 3.53 mg/dL (H)). ?Liver Function Tests: ?No results for input(s): AST, ALT, ALKPHOS, BILITOT, PROT, ALBUMIN in the last 168 hours. ?No results for input(s): LIPASE, AMYLASE in the last 168 hours. ?No results for input(s): AMMONIA in the  last 168 hours. ?Coagulation Profile: ?No results for input(s): INR, PROTIME in the last 168 hours. ?Cardiac Enzymes: ?No results for input(s): CKTOTAL, CKMB, CKMBINDEX, TROPONINI in the last 168 hours. ?BNP (last 3 results) ?No results for input(s): PROBNP in the last 8760 hours. ?HbA1C: ?No results for input(s): HGBA1C in the last 72 hours. ?CBG: ?No results for input(s): GLUCAP in the last 168 hours. ?Lipid Profile: ?No results for input(s): CHOL, HDL, LDLCALC, TRIG, CHOLHDL, LDLDIRECT in the last 72 hours. ?Thyroid Function Tests: ?No results for input(s): TSH, T4TOTAL, FREET4, T3FREE, THYROIDAB in the last 72 hours. ?Anemia Panel: ?No results for input(s): VITAMINB12, FOLATE, FERRITIN, TIBC, IRON, RETICCTPCT in the last 72 hours. ?Urine analysis: ?   ?Component Value Date/Time  ? COLORURINE YELLOW (A) 06/22/2021 1800  ? APPEARANCEUR CLEAR (A) 06/22/2021 1800  ? APPEARANCEUR Clear 02/17/2013 0013  ? LABSPEC 1.015 06/22/2021 1800  ? LABSPEC 1.029 02/17/2013 0013  ? PHURINE 5.0 06/22/2021 1800  ? GLUCOSEU NEGATIVE 06/22/2021 1800  ? GLUCOSEU >=500 02/17/2013 0013  ? HGBUR NEGATIVE 06/22/2021 1800  ? BILIRUBINUR NEGATIVE 06/22/2021 1800  ? BILIRUBINUR Negative 02/17/2013 0013  ? KETONESUR  NEGATIVE 06/22/2021 1800  ? PROTEINUR NEGATIVE 06/22/2021 1800  ? NITRITE NEGATIVE 06/22/2021 1800  ? LEUKOCYTESUR NEGATIVE 06/22/2021 1800  ? LEUKOCYTESUR Negative 02/17/2013 0013  ? ? ?Radiological Exams

## 2021-09-14 NOTE — ED Triage Notes (Signed)
Patient to ED via [facility] from home for a fall. Patient tripped in garden and is complaining of right hip pain. Hip noted to be shortened. 50 mcg fentanyl and 4 zofran given by EMS.  ?

## 2021-09-15 ENCOUNTER — Inpatient Hospital Stay: Payer: No Typology Code available for payment source | Admitting: Anesthesiology

## 2021-09-15 ENCOUNTER — Encounter: Payer: Self-pay | Admitting: Hospitalist

## 2021-09-15 ENCOUNTER — Other Ambulatory Visit: Payer: Self-pay

## 2021-09-15 ENCOUNTER — Inpatient Hospital Stay: Payer: No Typology Code available for payment source

## 2021-09-15 ENCOUNTER — Encounter
Admission: EM | Disposition: A | Discharge status: Skilled Nursing Facility | Payer: Self-pay | Source: Home / Self Care | Attending: Hospitalist

## 2021-09-15 DIAGNOSIS — S728X1A Other fracture of right femur, initial encounter for closed fracture: Secondary | ICD-10-CM | POA: Diagnosis not present

## 2021-09-15 HISTORY — PX: INTRAMEDULLARY (IM) NAIL INTERTROCHANTERIC: SHX5875

## 2021-09-15 LAB — GLUCOSE, CAPILLARY
Glucose-Capillary: 158 mg/dL — ABNORMAL HIGH (ref 70–99)
Glucose-Capillary: 203 mg/dL — ABNORMAL HIGH (ref 70–99)
Glucose-Capillary: 133 mg/dL — ABNORMAL HIGH (ref 70–99)
Glucose-Capillary: 152 mg/dL — ABNORMAL HIGH (ref 70–99)
Glucose-Capillary: 192 mg/dL — ABNORMAL HIGH (ref 70–99)

## 2021-09-15 LAB — CBC
HCT: 31.8 % — ABNORMAL LOW (ref 39.0–52.0)
Hemoglobin: 10.3 g/dL — ABNORMAL LOW (ref 13.0–17.0)
MCH: 30.6 pg (ref 26.0–34.0)
MCHC: 32.4 g/dL (ref 30.0–36.0)
MCV: 94.4 fL (ref 80.0–100.0)
Platelets: 172 10*3/uL (ref 150–400)
RBC: 3.37 MIL/uL — ABNORMAL LOW (ref 4.22–5.81)
RDW: 13.2 % (ref 11.5–15.5)
WBC: 9.1 10*3/uL (ref 4.0–10.5)
nRBC: 0 % (ref 0.0–0.2)

## 2021-09-15 LAB — BASIC METABOLIC PANEL
Anion gap: 5 (ref 5–15)
BUN: 58 mg/dL — ABNORMAL HIGH (ref 8–23)
CO2: 21 mmol/L — ABNORMAL LOW (ref 22–32)
Calcium: 8.6 mg/dL — ABNORMAL LOW (ref 8.9–10.3)
Chloride: 112 mmol/L — ABNORMAL HIGH (ref 98–111)
Creatinine, Ser: 3.34 mg/dL — ABNORMAL HIGH (ref 0.61–1.24)
GFR, Estimated: 18 mL/min — ABNORMAL LOW (ref >60)
Glucose, Bld: 162 mg/dL — ABNORMAL HIGH (ref 70–99)
Potassium: 5.3 mmol/L — ABNORMAL HIGH (ref 3.5–5.1)
Sodium: 138 mmol/L (ref 135–145)

## 2021-09-15 LAB — SURGICAL PCR SCREEN
MRSA, PCR: NEGATIVE
Staphylococcus aureus: NEGATIVE

## 2021-09-15 LAB — MAGNESIUM: Magnesium: 2.6 mg/dL — ABNORMAL HIGH (ref 1.7–2.4)

## 2021-09-15 SURGERY — FIXATION, FRACTURE, INTERTROCHANTERIC, WITH INTRAMEDULLARY ROD
Anesthesia: Spinal | Site: Hip | Laterality: Right

## 2021-09-15 MED ORDER — PHENYLEPHRINE HCL (PRESSORS) 10 MG/ML IV SOLN
INTRAVENOUS | Status: AC
  Filled 2021-09-15: qty 1

## 2021-09-15 MED ORDER — ONDANSETRON HCL 4 MG/2ML IJ SOLN
INTRAMUSCULAR | Status: AC
  Filled 2021-09-15: qty 2

## 2021-09-15 MED ORDER — PHENYLEPHRINE HCL (PRESSORS) 10 MG/ML IV SOLN
INTRAVENOUS | Status: DC | PRN
  Administered 2021-09-15: 240 ug via INTRAVENOUS
  Administered 2021-09-15: 80 ug via INTRAVENOUS
  Administered 2021-09-15 (×2): 160 ug via INTRAVENOUS

## 2021-09-15 MED ORDER — PHENOL 1.4 % MT LIQD: 1.0000 | OROMUCOSAL | Status: DC | PRN

## 2021-09-15 MED ORDER — KETAMINE HCL 50 MG/5ML IJ SOSY
PREFILLED_SYRINGE | INTRAMUSCULAR | Status: AC
  Filled 2021-09-15: qty 5

## 2021-09-15 MED ORDER — ZOLPIDEM TARTRATE 5 MG PO TABS: 5.0000 mg | ORAL_TABLET | Freq: Every evening | ORAL | Status: DC | PRN

## 2021-09-15 MED ORDER — ATORVASTATIN CALCIUM 80 MG PO TABS
80.0000 mg | ORAL_TABLET | Freq: Every day | ORAL | Status: DC
  Administered 2021-09-15 – 2021-09-22 (×8): 80 mg via ORAL
  Filled 2021-09-15 (×8): qty 1

## 2021-09-15 MED ORDER — ONDANSETRON HCL 4 MG/2ML IJ SOLN
4.0000 mg | Freq: Once | INTRAMUSCULAR | Status: AC | PRN
  Administered 2021-09-15: 4 mg via INTRAVENOUS

## 2021-09-15 MED ORDER — PHENYLEPHRINE HCL-NACL 20-0.9 MG/250ML-% IV SOLN
INTRAVENOUS | Status: DC | PRN
  Administered 2021-09-15: 30 ug/min via INTRAVENOUS

## 2021-09-15 MED ORDER — SODIUM CHLORIDE 0.9 % IV SOLN: INTRAVENOUS | Status: DC | PRN

## 2021-09-15 MED ORDER — TRANEXAMIC ACID-NACL 1000-0.7 MG/100ML-% IV SOLN: 1000.0000 mg | Freq: Once | INTRAVENOUS | Status: AC

## 2021-09-15 MED ORDER — SODIUM CHLORIDE 0.9 % IV SOLN: INTRAVENOUS | Status: DC

## 2021-09-15 MED ORDER — EPHEDRINE SULFATE (PRESSORS) 50 MG/ML IJ SOLN
INTRAMUSCULAR | Status: DC | PRN
Start: 2021-09-15 — End: 2021-09-15
  Administered 2021-09-15: 5 mg via INTRAVENOUS
  Administered 2021-09-15: 10 mg via INTRAVENOUS
  Administered 2021-09-15 (×2): 5 mg via INTRAVENOUS

## 2021-09-15 MED ORDER — FUROSEMIDE 40 MG PO TABS
40.0000 mg | ORAL_TABLET | Freq: Every day | ORAL | Status: DC
  Administered 2021-09-15 – 2021-09-16 (×2): 40 mg via ORAL
  Filled 2021-09-15 (×2): qty 1

## 2021-09-15 MED ORDER — CARVEDILOL 25 MG PO TABS
25.0000 mg | ORAL_TABLET | Freq: Two times a day (BID) | ORAL | Status: DC
  Administered 2021-09-16 (×2): 25 mg via ORAL
  Filled 2021-09-15 (×2): qty 1

## 2021-09-15 MED ORDER — BUPIVACAINE HCL (PF) 0.5 % IJ SOLN
INTRAMUSCULAR | Status: DC | PRN
  Administered 2021-09-15: 2.3 mL via INTRATHECAL

## 2021-09-15 MED ORDER — FENTANYL CITRATE (PF) 100 MCG/2ML IJ SOLN
INTRAMUSCULAR | Status: DC | PRN
  Administered 2021-09-15: 25 ug via INTRAVENOUS

## 2021-09-15 MED ORDER — AMLODIPINE BESYLATE 10 MG PO TABS
10.0000 mg | ORAL_TABLET | Freq: Every day | ORAL | Status: DC
  Administered 2021-09-15 – 2021-09-16 (×2): 10 mg via ORAL
  Filled 2021-09-15 (×2): qty 1

## 2021-09-15 MED ORDER — HYDROMORPHONE HCL 1 MG/ML IJ SOLN
INTRAMUSCULAR | Status: AC
  Filled 2021-09-15: qty 0.5

## 2021-09-15 MED ORDER — METOCLOPRAMIDE HCL 5 MG/ML IJ SOLN: 5.0000 mg | Freq: Three times a day (TID) | INTRAMUSCULAR | Status: DC | PRN

## 2021-09-15 MED ORDER — ENOXAPARIN SODIUM 30 MG/0.3ML IJ SOSY
30.0000 mg | PREFILLED_SYRINGE | INTRAMUSCULAR | Status: DC
  Administered 2021-09-16 – 2021-09-23 (×8): 30 mg via SUBCUTANEOUS
  Filled 2021-09-15 (×8): qty 0.3

## 2021-09-15 MED ORDER — DOCUSATE SODIUM 100 MG PO CAPS
100.0000 mg | ORAL_CAPSULE | Freq: Two times a day (BID) | ORAL | Status: DC
  Administered 2021-09-15 – 2021-09-23 (×15): 100 mg via ORAL
  Filled 2021-09-15 (×16): qty 1

## 2021-09-15 MED ORDER — TRANEXAMIC ACID-NACL 1000-0.7 MG/100ML-% IV SOLN
INTRAVENOUS | Status: AC
  Administered 2021-09-15: 1000 mg via INTRAVENOUS
  Filled 2021-09-15: qty 100

## 2021-09-15 MED ORDER — ACETAMINOPHEN 10 MG/ML IV SOLN: 1000.0000 mg | Freq: Once | INTRAVENOUS | Status: DC | PRN

## 2021-09-15 MED ORDER — METHOCARBAMOL 1000 MG/10ML IJ SOLN
500.0000 mg | Freq: Four times a day (QID) | INTRAVENOUS | Status: DC | PRN
  Filled 2021-09-15: qty 5

## 2021-09-15 MED ORDER — AZELASTINE HCL 0.1 % NA SOLN: 1.0000 | Freq: Two times a day (BID) | NASAL | Status: DC | PRN

## 2021-09-15 MED ORDER — ONDANSETRON HCL 4 MG PO TABS: 4.0000 mg | ORAL_TABLET | Freq: Four times a day (QID) | ORAL | Status: DC | PRN

## 2021-09-15 MED ORDER — MENTHOL 3 MG MT LOZG: 1.0000 | LOZENGE | OROMUCOSAL | Status: DC | PRN

## 2021-09-15 MED ORDER — GABAPENTIN 300 MG PO CAPS
300.0000 mg | ORAL_CAPSULE | Freq: Every day | ORAL | Status: DC
  Administered 2021-09-15 – 2021-09-23 (×9): 300 mg via ORAL
  Filled 2021-09-15 (×9): qty 1

## 2021-09-15 MED ORDER — PROPOFOL 500 MG/50ML IV EMUL
INTRAVENOUS | Status: DC | PRN
  Administered 2021-09-15: 60 ug/kg/min via INTRAVENOUS

## 2021-09-15 MED ORDER — PROPOFOL 10 MG/ML IV BOLUS
INTRAVENOUS | Status: DC | PRN
Start: 2021-09-15 — End: 2021-09-15
  Administered 2021-09-15: 30 mg via INTRAVENOUS

## 2021-09-15 MED ORDER — ASPIRIN EC 81 MG PO TBEC
81.0000 mg | DELAYED_RELEASE_TABLET | Freq: Every day | ORAL | Status: DC
  Administered 2021-09-15 – 2021-09-23 (×9): 81 mg via ORAL
  Filled 2021-09-15 (×9): qty 1

## 2021-09-15 MED ORDER — CHLORHEXIDINE GLUCONATE CLOTH 2 % EX PADS
6.0000 | MEDICATED_PAD | Freq: Every day | CUTANEOUS | Status: DC
  Administered 2021-09-16 – 2021-09-23 (×9): 6 via TOPICAL

## 2021-09-15 MED ORDER — 0.9 % SODIUM CHLORIDE (POUR BTL) OPTIME
TOPICAL | Status: DC | PRN
Start: 2021-09-15 — End: 2021-09-15
  Administered 2021-09-15: 1000 mL

## 2021-09-15 MED ORDER — BUPIVACAINE HCL (PF) 0.5 % IJ SOLN
INTRAMUSCULAR | Status: AC
  Filled 2021-09-15: qty 10

## 2021-09-15 MED ORDER — LISINOPRIL 20 MG PO TABS
40.0000 mg | ORAL_TABLET | Freq: Every day | ORAL | Status: DC
Start: 2021-09-15 — End: 2021-09-17
  Administered 2021-09-15 – 2021-09-16 (×2): 40 mg via ORAL
  Filled 2021-09-15 (×2): qty 2

## 2021-09-15 MED ORDER — IPRATROPIUM-ALBUTEROL 0.5-2.5 (3) MG/3ML IN SOLN
RESPIRATORY_TRACT | Status: AC
  Administered 2021-09-15: 3 mL via RESPIRATORY_TRACT
  Filled 2021-09-15: qty 3

## 2021-09-15 MED ORDER — EPHEDRINE 5 MG/ML INJ
INTRAVENOUS | Status: AC
  Filled 2021-09-15: qty 5

## 2021-09-15 MED ORDER — CEFAZOLIN SODIUM-DEXTROSE 2-4 GM/100ML-% IV SOLN
2.0000 g | Freq: Four times a day (QID) | INTRAVENOUS | Status: AC
  Administered 2021-09-15 – 2021-09-16 (×3): 2 g via INTRAVENOUS
  Filled 2021-09-15 (×3): qty 100

## 2021-09-15 MED ORDER — MAGNESIUM HYDROXIDE 400 MG/5ML PO SUSP
30.0000 mL | Freq: Every day | ORAL | Status: DC | PRN
  Administered 2021-09-20: 30 mL via ORAL
  Filled 2021-09-15: qty 30

## 2021-09-15 MED ORDER — IPRATROPIUM-ALBUTEROL 0.5-2.5 (3) MG/3ML IN SOLN: 3.0000 mL | Freq: Once | RESPIRATORY_TRACT | Status: AC

## 2021-09-15 MED ORDER — FENTANYL CITRATE (PF) 100 MCG/2ML IJ SOLN: 25.0000 ug | INTRAMUSCULAR | Status: DC | PRN

## 2021-09-15 MED ORDER — BISACODYL 10 MG RE SUPP
10.0000 mg | Freq: Every day | RECTAL | Status: DC | PRN
  Administered 2021-09-16: 10 mg via RECTAL
  Filled 2021-09-15: qty 1

## 2021-09-15 MED ORDER — ONDANSETRON HCL 4 MG/2ML IJ SOLN: 4.0000 mg | Freq: Four times a day (QID) | INTRAMUSCULAR | Status: DC | PRN

## 2021-09-15 MED ORDER — PROPOFOL 1000 MG/100ML IV EMUL
INTRAVENOUS | Status: AC
  Filled 2021-09-15: qty 100

## 2021-09-15 MED ORDER — SODIUM POLYSTYRENE SULFONATE 15 GM/60ML PO SUSP
30.0000 g | Freq: Once | ORAL | Status: AC
  Administered 2021-09-15: 30 g via ORAL
  Filled 2021-09-15: qty 120

## 2021-09-15 MED ORDER — METHOCARBAMOL 500 MG PO TABS
500.0000 mg | ORAL_TABLET | Freq: Four times a day (QID) | ORAL | Status: DC | PRN
  Administered 2021-09-15 – 2021-09-20 (×4): 500 mg via ORAL
  Filled 2021-09-15 (×4): qty 1

## 2021-09-15 MED ORDER — FENTANYL CITRATE (PF) 100 MCG/2ML IJ SOLN
INTRAMUSCULAR | Status: AC
Start: 2021-09-15 — End: ?
  Filled 2021-09-15: qty 2

## 2021-09-15 MED ORDER — METOCLOPRAMIDE HCL 5 MG PO TABS
5.0000 mg | ORAL_TABLET | Freq: Three times a day (TID) | ORAL | Status: DC | PRN
  Administered 2021-09-20 – 2021-09-21 (×2): 10 mg via ORAL
  Filled 2021-09-15 (×3): qty 2

## 2021-09-15 MED ORDER — HYDROMORPHONE HCL 1 MG/ML IJ SOLN
0.5000 mg | Freq: Once | INTRAMUSCULAR | Status: AC
  Administered 2021-09-15: 0.5 mg via INTRAVENOUS

## 2021-09-15 MED ORDER — CEFAZOLIN SODIUM-DEXTROSE 1-4 GM/50ML-% IV SOLN
INTRAVENOUS | Status: AC
  Filled 2021-09-15: qty 50

## 2021-09-15 MED ORDER — ALUM & MAG HYDROXIDE-SIMETH 200-200-20 MG/5ML PO SUSP: 30.0000 mL | ORAL | Status: DC | PRN

## 2021-09-15 SURGICAL SUPPLY — 38 items
BIT DRILL CROWE POINT TWST 4.3 (DRILL) IMPLANT
BNDG COHESIVE 4X5 TAN ST LF (GAUZE/BANDAGES/DRESSINGS) ×4 IMPLANT
CHLORAPREP W/TINT 26 (MISCELLANEOUS) ×2 IMPLANT
DRAPE 3/4 80X56 (DRAPES) ×2 IMPLANT
DRAPE U-SHAPE 47X51 STRL (DRAPES) ×2 IMPLANT
DRILL CROWE POINT TWIST 4.3 (DRILL) ×2
DRSG OPSITE POSTOP 3X4 (GAUZE/BANDAGES/DRESSINGS) ×3 IMPLANT
DRSG OPSITE POSTOP 4X6 (GAUZE/BANDAGES/DRESSINGS) ×2 IMPLANT
GLOVE SURG SYN 9.0  PF PI (GLOVE) ×1
GLOVE SURG SYN 9.0 PF PI (GLOVE) ×1 IMPLANT
GLOVE SURG UNDER POLY LF SZ9 (GLOVE) ×2 IMPLANT
GOWN SRG 2XL LVL 4 RGLN SLV (GOWNS) ×1 IMPLANT
GOWN STRL NON-REIN 2XL LVL4 (GOWNS) ×1
GOWN STRL REUS W/ TWL LRG LVL3 (GOWN DISPOSABLE) ×1 IMPLANT
GOWN STRL REUS W/TWL LRG LVL3 (GOWN DISPOSABLE) ×1
GUIDEPIN VERSANAIL DSP 3.2X444 (ORTHOPEDIC DISPOSABLE SUPPLIES) ×1 IMPLANT
GUIDEWIRE BALL NOSE 100CM (WIRE) ×1 IMPLANT
HFN RH 130 DEG 11MM X 360MM (Orthopedic Implant) ×1 IMPLANT
KIT TURNOVER KIT A (KITS) ×2 IMPLANT
MANIFOLD NEPTUNE II (INSTRUMENTS) ×2 IMPLANT
MAT ABSORB  FLUID 56X50 GRAY (MISCELLANEOUS) ×1
MAT ABSORB FLUID 56X50 GRAY (MISCELLANEOUS) ×1 IMPLANT
NDL FILTER BLUNT 18X1 1/2 (NEEDLE) ×1 IMPLANT
NEEDLE FILTER BLUNT 18X 1/2SAF (NEEDLE)
NEEDLE FILTER BLUNT 18X1 1/2 (NEEDLE) IMPLANT
NS IRRIG 1000ML POUR BTL (IV SOLUTION) ×1 IMPLANT
NS IRRIG 500ML POUR BTL (IV SOLUTION) ×1 IMPLANT
PACK HIP COMPR (MISCELLANEOUS) ×2 IMPLANT
SCALPEL PROTECTED #15 DISP (BLADE) ×4 IMPLANT
SCREW BONE CORTICAL 5.0X52 (Screw) ×1 IMPLANT
SCREW LAG 10.5MMX105MM HFN (Screw) ×1 IMPLANT
STAPLER SKIN PROX 35W (STAPLE) ×2 IMPLANT
SUT VIC AB 1 CT1 36 (SUTURE) ×2 IMPLANT
SUT VIC AB 2-0 CT1 (SUTURE) ×2 IMPLANT
SYR 10ML LL (SYRINGE) ×1 IMPLANT
SYR BULB IRRIG 60ML STRL (SYRINGE) ×2 IMPLANT
WATER STERILE IRR 1000ML POUR (IV SOLUTION) ×1 IMPLANT
WATER STERILE IRR 500ML POUR (IV SOLUTION) ×1 IMPLANT

## 2021-09-15 NOTE — Consult Note (Signed)
reason for Consult: Right hip fracture ?Referring Physician: Dr. Billie Ruddy ? ?Phillip Reyes is an 81 y.o. male.  ?HPI: Patient is a 81 year old who has general poor health but has remained active.  He suffered a fall in his home out in the front yard injuring his right hip and was unable to stand.  He is brought to the emergency room and found to have a comminuted right intertrochanteric hip fracture.  He reports normally he walks without assistive device, he is able to drive and helps take care of his wife who is in worse health than him.  He has of ramp to get into the home and that is for his wife.  Denies loss of consciousness ? ?Past Medical History:  ?Diagnosis Date  ? Arthritis   ? Cancer [facility])   ? COPD (chronic obstructive pulmonary disease) ([place])   ? Diabetes mellitus without complication ([place])   ? Hypertension   ? Renal disorder   ? Stroke [facility])   ? ? ?Past Surgical History:  ?Procedure Laterality Date  ? APPENDECTOMY    ? THROAT SURGERY    ? ? ?Family History  ?Problem Relation Age of Onset  ? Congestive Heart Failure Father   ? Other Mother   ?     unknown medical history  ? ? ?Social History:  reports that he has been smoking cigarettes. He has a 30.50 pack-year smoking history. He has never used smokeless tobacco. He reports that he does not drink alcohol and does not use drugs. ? ?Allergies:  ?Allergies  ?Allergen Reactions  ? Norco [Hydrocodone-Acetaminophen] Shortness Of Breath  ? ? ?Medications: I have reviewed the patient's current medications. ? ?Results for orders placed or performed during the hospital encounter of 09/14/21 (from the past 48 hour(s))  ?Sample to Blood Bank     Status: None  ? Collection Time: 09/14/21  3:31 PM  ?Result Value Ref Range  ? Blood Bank Specimen SAMPLE AVAILABLE FOR TESTING   ? Sample Expiration    ?  09/17/2021,2359 ?Performed at [facility], [address] [place], [place] [address] ?  ?CBC with Differential     Status: Abnormal  ? Collection Time: 09/14/21   4:29 PM  ?Result Value Ref Range  ? WBC 9.2 4.0 - 10.5 K/uL  ? RBC 3.14 (L) 4.22 - 5.81 MIL/uL  ? Hemoglobin 9.6 (L) 13.0 - 17.0 g/dL  ? HCT 30.2 (L) 39.0 - 52.0 %  ? MCV 96.2 80.0 - 100.0 fL  ? MCH 30.6 26.0 - 34.0 pg  ? MCHC 31.8 30.0 - 36.0 g/dL  ? RDW 13.2 11.5 - 15.5 %  ? Platelets 166 150 - 400 K/uL  ? nRBC 0.0 0.0 - 0.2 %  ? Neutrophils Relative % 73 %  ? Neutro Abs 6.7 1.7 - 7.7 K/uL  ? Lymphocytes Relative 13 %  ? Lymphs Abs 1.2 0.7 - 4.0 K/uL  ? Monocytes Relative 9 %  ? Monocytes Absolute 0.8 0.1 - 1.0 K/uL  ? Eosinophils Relative 4 %  ? Eosinophils Absolute 0.4 0.0 - 0.5 K/uL  ? Basophils Relative 1 %  ? Basophils Absolute 0.1 0.0 - 0.1 K/uL  ? Immature Granulocytes 0 %  ? Abs Immature Granulocytes 0.03 0.00 - 0.07 K/uL  ?  Comment: Performed at [facility], [address], [place], [place] [address]  ?Basic metabolic panel     Status: Abnormal  ? Collection Time: 09/14/21  4:29 PM  ?Result Value Ref Range  ?  Sodium 138 135 - 145 mmol/L  ? Potassium 5.7 (H) 3.5 - 5.1 mmol/L  ? Chloride 114 (H) 98 - 111 mmol/L  ? CO2 21 (L) 22 - 32 mmol/L  ? Glucose, Bld 101 (H) 70 - 99 mg/dL  ?  Comment: Glucose reference range applies only to samples taken after fasting for at least 8 hours.  ? BUN 61 (H) 8 - 23 mg/dL  ? Creatinine, Ser 3.53 (H) 0.61 - 1.24 mg/dL  ? Calcium 7.9 (L) 8.9 - 10.3 mg/dL  ? GFR, Estimated 17 (L) >60 mL/min  ?  Comment: (NOTE) ?Calculated using the CKD-EPI Creatinine Equation (2021) ?  ? Anion gap 3 (L) 5 - 15  ?  Comment: Performed at [facility], [address], [place],  [address]  ?Glucose, capillary     Status: Abnormal  ? Collection Time: 09/14/21  8:37 PM  ?Result Value Ref Range  ? Glucose-Capillary 131 (H) 70 - 99 mg/dL  ?  Comment: Glucose reference range applies only to samples taken after fasting for at least 8 hours.  ?Surgical pcr screen     Status: None  ? Collection Time: 09/14/21 11:29 PM  ? Specimen: Nasal Mucosa; Nasal Swab  ?Result Value Ref  Range  ? MRSA, PCR NEGATIVE NEGATIVE  ? Staphylococcus aureus NEGATIVE NEGATIVE  ?  Comment: (NOTE) ?The Xpert SA Assay (FDA approved for NASAL specimens in patients 36 ?years of age and older), is one component of a comprehensive ?surveillance program. It is not intended to diagnose infection nor to ?guide or monitor treatment. ?Performed at [facility], [place], ?[place] [address] ?  ? ? ?DG Chest Portable 1 View ? ?Result Date: 09/14/2021 ?CLINICAL DATA:  Preoperative respiratory exam for hip fracture. 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 Heart size is normal. The lungs are clear. The vascularity is normal. No acute bone finding. IMPRESSION: No active disease. Electronically Signed   By: Nelson Chimes M.D.   On: 09/14/2021 16:55  ? ?DG Hip Unilat W or Wo Pelvis 2-3 Views Right ? ?Result Date: 09/14/2021 ?CLINICAL DATA:  Trauma, fall EXAM: DG HIP (WITH OR WITHOUT PELVIS) 2-3V RIGHT COMPARISON:  None Available. FINDINGS: Comminuted intertrochanteric fracture is seen in the proximal right femur. There is medial displacement of lesser trochanter. There is 9.5 mm offset in alignment of fracture fragments along the greater tuberosity. There is no dislocation. Scattered vascular calcifications are seen. IMPRESSION: Comminuted displaced intertrochanteric fracture is seen in the proximal right femur. Electronically Signed   By: Elmer Picker M.D.   On: 09/14/2021 16:24   ? ?Review of Systems ?Blood pressure (!) 137/57, pulse (!) 57, temperature (!) 97.3 ?F (36.3 ?C), temperature source Oral, resp. rate 20, height '5\' 10"'$  (1.778 m), weight 72.6 kg, SpO2 97 %. ?Physical Exam ?The right leg is shortened and externally rotated with the hip flexed.  He is able to flex extend his toes with sensation intact to the foot with trace pulse dorsalis pedis.  There is moderate thigh swelling.  Skin is intact without  ecchymosis. ?Assessment/Plan: ?Comminuted right proximal intertrochanteric hip fracture ?Plan is for open reduction internal fixation with IM device.  Likely will be several days with his medical problems and need skilled care for rehab since his wife will be able to take care of him. ? ?Hessie Knows ?09/15/2021, 6:55 AM  ? ? ? ? ?

## 2021-09-15 NOTE — Op Note (Signed)
09/15/2021 ? ?4:08 PM ? ?PATIENT:  Phillip Reyes  81 y.o. male ? ?PRE-OPERATIVE DIAGNOSIS: Right comminuted intertrochanteric hip fracture ? ?POST-OPERATIVE DIAGNOSIS: Right comminuted intertrochanteric hip fracture ? ?PROCEDURE:  Procedure(s): ?INTRAMEDULLARY (IM) NAIL INTERTROCHANTRIC (Right) ? ?SURGEON: Laurene Footman, MD ? ?ASSISTANTS: None ? ?ANESTHESIA:   spinal ? ?EBL:  Total I/O ?In: 450 [I.V.:400; IV Piggyback:50] ?Out: 600 [Urine:600] ? ?BLOOD ADMINISTERED:none ? ?DRAINS: none  ? ?LOCAL MEDICATIONS USED:  NONE ? ?SPECIMEN:  No Specimen ? ?DISPOSITION OF SPECIMEN:  N/A ? ?COUNTS:  YES ? ?TOURNIQUET:  * No tourniquets in log * ? ?IMPLANTS: Biomet affixes 11 x 360 rod with 105 mm leg screw and 52 mm distal 5.0 mm interlocking screw ? ?DICTATION: Viviann Spare Dictation patient was brought to the operating room and after adequate spinal anesthesia was obtained the left leg was placed in the leg holder right leg in the traction boot with slight traction applied.  C arm was brought in and good visualization was obtained with anatomic alignment essentially.  The hip was then prepped and draped using a barrier drape method and appropriate patient identification and timeout procedure were completed.  Proximal incision was made centered over the greater trochanter guidewire inserted the tip of the trochanter followed by proximal reaming and placement of the long guidewire.  Measurement was made off of this and reaming is carried out to 13 mm and then 11 mm diameter rod was inserted to the appropriate depth.  Using the targeting device a lateral incision was made and a guidewire placed in the center center of the head measured drilled and the 105 mm leg screw inserted with traction released and compression applied with good reduction of the fracture.  The setscrew was placed proximally tightening and a quarter turn off to allow for further compression and the insertion handle was removed with AP lateral images obtained.   The leg abducted the distal interlocking oblique hole was filled using standard technique making a small incision drilling measuring and placing the bicortical screw.  The wounds were irrigated with #1 Vicryl to close deep fascia 2-0 Vicryl suture for subcutaneous tissue with skin staples and honeycomb dressings applied. ? ?PLAN OF CARE: Admit to inpatient  ? ?PATIENT DISPOSITION:  PACU - hemodynamically stable. ?  ? ?

## 2021-09-15 NOTE — Anesthesia Procedure Notes (Addendum)
Spinal ? ?Patient location during procedure: OR ?Start time: 09/15/2021 3:03 PM ?End time: 09/15/2021 3:07 PM ?Reason for block: surgical anesthesia ?Staffing ?Performed: resident/CRNA  ?Resident/CRNA: Lia Foyer, CRNA ?Preanesthetic Checklist ?Completed: patient identified, IV checked, site marked, risks and benefits discussed, surgical consent, monitors and equipment checked, pre-op evaluation and timeout performed ?Spinal Block ?Patient position: left lateral decubitus ?Prep: ChloraPrep ?Patient monitoring: heart rate, cardiac monitor, continuous pulse ox and blood pressure ?Approach: midline ?Location: L3-4 ?Injection technique: single-shot ?Needle ?Needle type: Pencan  ?Needle gauge: 25 G ?Needle length: 9 cm ?Assessment ?Sensory level: T4 ?Events: CSF return ? ? ? ?

## 2021-09-15 NOTE — Transfer of Care (Signed)
Immediate Anesthesia Transfer of Care Note ? ?Patient: Phillip Reyes ? ?Procedure(s) Performed: INTRAMEDULLARY (IM) NAIL INTERTROCHANTRIC (Right: Hip) ? ?Patient Location: PACU ? ?Anesthesia Type:General and Spinal ? ?Level of Consciousness: drowsy ? ?Airway & Oxygen Therapy: Patient Spontanous Breathing and Patient connected to face mask oxygen ? ?Post-op Assessment: Report given to RN and Post -op Vital signs reviewed and stable ? ?Post vital signs: Reviewed and stable ? ?Last Vitals:  ?Vitals Value Taken Time  ?BP 113/57 09/15/21 1604  ?Temp    ?Pulse 60 09/15/21 1609  ?Resp 9 09/15/21 1609  ?SpO2 100 % 09/15/21 1609  ?Vitals shown include unvalidated device data. ? ?Last Pain:  ?Vitals:  ? 09/15/21 1445  ?TempSrc:   ?PainSc: 10-Worst pain ever  ?   ? ?Patients Stated Pain Goal: 2 (09/14/21 1945) ? ?Complications: No notable events documented. ?

## 2021-09-15 NOTE — Progress Notes (Signed)
?  Progress Note ? ? ?Patient: Phillip Reyes MVH:[id number] DOB: 06/11/1940 DOA: 09/14/2021     1 ?DOS: the patient was seen and examined on 09/15/2021 ?  ?Brief hospital course: ?No notes on file ? ?Assessment and Plan: ?* Other fracture of right femur, initial encounter for closed fracture ([place]) ?--from accidental fall.  xray showed Comminuted displaced intertrochanteric fracture is seen in the ?proximal right femur. ?--Ortho consulted, with Dr. Rudene Christians ?Plan: ?--OR this afternoon for INTRAMEDULLARY (IM) NAIL. ? ?Hyperkalemia ?--may be due to CKD4 ?--Kayexalate 30 g x2 ? ?CKD (chronic kidney disease) stage 4, GFR 15-29 ml/min (HCC) ?--Cr at baseline. ? ?Chronic systolic CHF (congestive heart failure) ([place]) ?--appeared compensated.  Last LVEF 40 to 45% in Feb 2023. ? ? ?H/O: stroke ?--Hold home ASA  ?--resume home statin after discharge ? ?COPD (chronic obstructive pulmonary disease) ([place]) ?--stable. ?--cont home Symbicort as Dulera ? ?HTN (hypertension) ?--hold home amlodipine since BP wnl ? ?Type 2 diabetes mellitus ([place]) ?--recent A1c 6.7, well controlled ?--hold home long-acting insulin for now ?--SSI for now ? ? ? ? ?  ? ?Subjective:  ?Pt requested multiple doses of fentanyl for his pain. ? ?Going to OR this afternoon for INTRAMEDULLARY (IM) NAIL. ? ? ?Physical Exam: ? ?Constitutional: NAD, AAOx3 ?HEENT: conjunctivae and lids normal, EOMI ?CV: No cyanosis.   ?RESP: normal respiratory effort, on RA ?SKIN: warm, dry ? ? ?Data Reviewed: ? ?Family Communication:  ? ?Disposition: ?Status is: Inpatient ? ? Planned Discharge Destination: Rehab ? ? ? ?Time spent: 35 minutes ? ?Author: ?Enzo Bi, MD ?09/15/2021 4:50 PM ? ?For on call review [email or web address].  ?

## 2021-09-15 NOTE — Anesthesia Preprocedure Evaluation (Addendum)
Anesthesia Evaluation  ?Patient identified by MRN, date of birth, ID band ?Patient awake ? ? ? ?Reviewed: ?Allergy & Precautions, NPO status , Patient's Chart, lab work & pertinent test results ? ?History of Anesthesia Complications ?Negative for: history of anesthetic complications ? ?Airway ?Mallampati: III ? ?TM Distance: >3 FB ?Neck ROM: Full ? ? ? Dental ? ?(+) Edentulous Upper, Edentulous Lower ?  ?Pulmonary ?neg sleep apnea, COPD,  COPD inhaler, Current Smoker and Patient abstained from smoking.,  ?  ?Pulmonary exam normal ?breath sounds clear to auscultation ? ? ? ? ? ? Cardiovascular ?Exercise Tolerance: Good ?METShypertension, Pt. on medications ?+CHF  ?(-) CAD and (-) Past MI (-) dysrhythmias + Cardiac Defibrillator ? ?Rhythm:Regular Rate:Normal ?- Systolic murmurs ?TTE 06/2021: ?IMPRESSIONS  ? ? ??1. Left ventricular ejection fraction, by estimation, is 40 to 45%. The  ?left ventricle has mildly decreased function. The left ventricle  ?demonstrates global hypokinesis. Left ventricular diastolic parameters  ?were normal.  ??2. Right ventricular systolic function is normal. The right ventricular  ?size is normal.  ??3. The mitral valve is normal in structure. Mild mitral valve  ?regurgitation.  ??4. The aortic valve is normal in structure. Aortic valve regurgitation is  ?trivial. Aortic valve sclerosis is present, with no evidence of aortic  ?valve stenosis.  ?  ?Neuro/Psych ?Residual right sided weakness ?CVA, Residual Symptoms negative psych ROS  ? GI/Hepatic ?neg GERD  ,(+)  ?  ? (-) substance abuse ? ,   ?Endo/Other  ?diabetes, Type 2, Insulin Dependent ? Renal/GU ?CRFRenal disease  ? ?  ?Musculoskeletal ? ?(+) Arthritis ,  ? Abdominal ?  ?Peds ? Hematology ?  ?Anesthesia Other Findings ?Past Medical History: ?No date: Arthritis ?No date: Cancer [facility]) ?No date: COPD (chronic obstructive pulmonary disease) ([place]) ?No date: Diabetes mellitus without complication ([place]) ?No  date: Hypertension ?No date: Renal disorder ?No date: Stroke [facility]) ? Reproductive/Obstetrics ? ?  ? ? ? ? ? ? ? ? ? ? ? ? ? ?  ?  ? ? ? ? ? ?Anesthesia Physical ?Anesthesia Plan ? ?ASA: 3 ? ?Anesthesia Plan: Spinal  ? ?Post-op Pain Management:   ? ?Induction: Intravenous ? ?PONV Risk Score and Plan: 1 and Ondansetron, Dexamethasone, Propofol infusion, TIVA and Treatment may vary due to age or medical condition ? ?Airway Management Planned: Natural Airway ? ?Additional Equipment: None ? ?Intra-op Plan:  ? ?Post-operative Plan:  ? ?Informed Consent: I have reviewed the patients History and Physical, chart, labs and discussed the procedure including the risks, benefits and alternatives for the proposed anesthesia with the patient or authorized representative who has indicated his/her understanding and acceptance.  ? ? ? ? ? ?Plan Discussed with: CRNA and Surgeon ? ?Anesthesia Plan Comments: (Discussed R/B/A of neuraxial anesthesia technique with patient: ?- rare risks of spinal/epidural hematoma, nerve damage, infection ?- Risk of PDPH ?- Risk of nausea and vomiting ?- Risk of conversion to general anesthesia and its associated risks, including sore throat, damage to lips/eyes/teeth/oropharynx, and rare risks such as cardiac and respiratory events. ?- Risk of allergic reactions ? ?Discussed the role of CRNA in patient's perioperative care. ? ?No anticoagulant use, confirmed with patient's caretaker [name] by phone. ? ?Patient voiced understanding.)  ? ? ? ? ? ? ?Anesthesia Quick Evaluation ? ?

## 2021-09-16 ENCOUNTER — Encounter: Payer: Self-pay | Admitting: Orthopedic Surgery

## 2021-09-16 DIAGNOSIS — S728X1A Other fracture of right femur, initial encounter for closed fracture: Secondary | ICD-10-CM | POA: Diagnosis not present

## 2021-09-16 DIAGNOSIS — R1310 Dysphagia, unspecified: Secondary | ICD-10-CM

## 2021-09-16 LAB — BASIC METABOLIC PANEL
Anion gap: 6 (ref 5–15)
BUN: 50 mg/dL — ABNORMAL HIGH (ref 8–23)
CO2: 24 mmol/L (ref 22–32)
Calcium: 8 mg/dL — ABNORMAL LOW (ref 8.9–10.3)
Chloride: 108 mmol/L (ref 98–111)
Creatinine, Ser: 3.18 mg/dL — ABNORMAL HIGH (ref 0.61–1.24)
GFR, Estimated: 19 mL/min — ABNORMAL LOW (ref >60)
Glucose, Bld: 195 mg/dL — ABNORMAL HIGH (ref 70–99)
Potassium: 4.1 mmol/L (ref 3.5–5.1)
Sodium: 138 mmol/L (ref 135–145)

## 2021-09-16 LAB — CBC
HCT: 26.6 % — ABNORMAL LOW (ref 39.0–52.0)
Hemoglobin: 8.5 g/dL — ABNORMAL LOW (ref 13.0–17.0)
MCH: 30 pg (ref 26.0–34.0)
MCHC: 32 g/dL (ref 30.0–36.0)
MCV: 94 fL (ref 80.0–100.0)
Platelets: 140 10*3/uL — ABNORMAL LOW (ref 150–400)
RBC: 2.83 MIL/uL — ABNORMAL LOW (ref 4.22–5.81)
RDW: 13.1 % (ref 11.5–15.5)
WBC: 9.1 10*3/uL (ref 4.0–10.5)
nRBC: 0 % (ref 0.0–0.2)

## 2021-09-16 LAB — GLUCOSE, CAPILLARY
Glucose-Capillary: 168 mg/dL — ABNORMAL HIGH (ref 70–99)
Glucose-Capillary: 196 mg/dL — ABNORMAL HIGH (ref 70–99)
Glucose-Capillary: 143 mg/dL — ABNORMAL HIGH (ref 70–99)
Glucose-Capillary: 137 mg/dL — ABNORMAL HIGH (ref 70–99)

## 2021-09-16 LAB — MAGNESIUM: Magnesium: 2.2 mg/dL (ref 1.7–2.4)

## 2021-09-16 MED ORDER — FE FUMARATE-B12-VIT C-FA-IFC PO CAPS
1.0000 | ORAL_CAPSULE | Freq: Two times a day (BID) | ORAL | Status: DC
  Administered 2021-09-16 – 2021-09-23 (×15): 1 via ORAL
  Filled 2021-09-16 (×15): qty 1

## 2021-09-16 NOTE — Plan of Care (Signed)

## 2021-09-16 NOTE — Assessment & Plan Note (Addendum)
--  SLP eval, rec nectar-thick liquid during acute illness, now back on thin liquid. ?

## 2021-09-16 NOTE — Progress Notes (Signed)
? ?  Subjective: ?1 Day Post-Op Procedure(s) (LRB): ?INTRAMEDULLARY (IM) NAIL INTERTROCHANTRIC (Right) ?Patient reports pain as mild.   ?Patient is well, and has had no acute complaints or problems ?Denies any CP, SOB, ABD pain. ?We will continue therapy today.  ? ?Objective: ?Vital signs in last 24 hours: ?Temp:  [97.5 ?F (36.4 ?C)-98.6 ?F (37 ?C)] 98.2 ?F (36.8 ?C) (05/12 0449) ?Pulse Rate:  [58-81] 77 (05/12 0449) ?Resp:  [10-22] 17 (05/12 0449) ?BP: (113-156)/(52-68) 118/64 (05/12 0449) ?SpO2:  [91 %-100 %] 92 % (05/12 0449) ? ?Intake/Output from previous day: ?05/11 0701 - 05/12 0700 ?In: 1808.8 [P.O.:600; I.V.:1158.8; IV Piggyback:50] ?Out: 750 [Urine:750] ?Intake/Output this shift: ?No intake/output data recorded. ? ?Recent Labs  ?  09/14/21 ?1629 09/15/21 ?0734 09/16/21 ?8466  ?HGB 9.6* 10.3* 8.5*  ? ?Recent Labs  ?  09/15/21 ?0734 09/16/21 ?5993  ?WBC 9.1 9.1  ?RBC 3.37* 2.83*  ?HCT 31.8* 26.6*  ?PLT 172 140*  ? ?Recent Labs  ?  09/15/21 ?0734 09/16/21 ?5701  ?NA 138 138  ?K 5.3* 4.1  ?CL 112* 108  ?CO2 21* 24  ?BUN 58* 50*  ?CREATININE 3.34* 3.18*  ?GLUCOSE 162* 195*  ?CALCIUM 8.6* 8.0*  ? ?No results for input(s): LABPT, INR in the last 72 hours. ? ?EXAM ?General - Patient is Alert, Appropriate, and Oriented ?Extremity - Neurovascular intact ?Sensation intact distally ?Intact pulses distally ?Dressing - no drainage, CDI ?Motor Function - intact, moving foot and toes well on exam.  ? ?Past Medical History:  ?Diagnosis Date  ? Arthritis   ? Cancer [facility])   ? COPD (chronic obstructive pulmonary disease) ([place])   ? Diabetes mellitus without complication ([place])   ? Hypertension   ? Renal disorder   ? Stroke [facility])   ? ? ?Assessment/Plan:   ?1 Day Post-Op Procedure(s) (LRB): ?INTRAMEDULLARY (IM) NAIL INTERTROCHANTRIC (Right) ?Principal Problem: ?  Other fracture of right femur, initial encounter for closed fracture ([place]) ?Active Problems: ?  Type 2 diabetes mellitus ([place]) ?  HTN (hypertension) ?  COPD (chronic  obstructive pulmonary disease) ([place]) ?  H/O: stroke ?  Chronic systolic CHF (congestive heart failure) ([place]) ?  CKD (chronic kidney disease) stage 4, GFR 15-29 ml/min (HCC) ?  Hyperkalemia ? ?Estimated body mass index is 22.96 kg/m? as calculated from the following: ?  Height as of this encounter: '5\' 10"'$  (1.778 m). ?  Weight as of this encounter: 72.6 kg. ?Advance diet ?Up with therapy ?Work on [employer] ?VSS ?Acute post op blood loss anemia - Hgb 8.5, start Fe supplement. Recheck labs in the am ?CM to assist with discharge to SNF ? ?DVT Prophylaxis - Lovenox, TED hose, and SCDs ?Weight-Bearing as tolerated to right leg ? ? ?T. Rachelle Hora, PA-C ?[place] ?09/16/2021, 7:26 AM ? ? ?

## 2021-09-16 NOTE — Progress Notes (Signed)
1735 ?Dr Billie Ruddy aware that pt voided 180m since foley removal, bladder scan post void showed 2246mremaining. Verbal orders for I/O cath if bladder scan shows greater than 500 ml ?

## 2021-09-16 NOTE — Evaluation (Signed)
Occupational Therapy Evaluation ?Patient Details ?Name: Phillip Reyes ?MRN: [id number] ?DOB: Dec 08, 1940 ?Today's Date: 09/16/2021 ? ? ?History of Present Illness Pt is 81 y.o. male who was admitted to [facility] after a fall, s/p R hip IM nailing. PMHx includes: Acute kidney disease, Anemia, HTN, CVA, PVD, and Dyslipidemia. Has had some hospitalizations recently.  ? ?Clinical Impression ?  ?Pt seen for OT evaluation this date, POD#1 from above surgery. Pt reports being independent in most ADL and indep with mobility prior to surgery, however requiring assist from a caregiver for bathing and IADL. PCA also assists spouse with bathing, dressing, and IADL. Pt shares that he was able to assist spouse with toileting needs at home and also drives. Endorses only this fall in the past month; difficulty with recall for past 48mo Pt is eager to return to PLOF with less pain and improved safety and independence. Pt currently requires MOD A for bed mobility and demo'd great difficulty maintaining static sitting balance, requiring BUE support and SBA-MOD A from OT with MAX VC to correct. Pt reporting "I just can't help it." Pt requires MAX A for LB ADL tasks. Pt educated in bed moblity, static/dynamic sitting balance, strategies to minimize aspiration risk during meals (noted to cough/clear his throat several times at end of breakfast when OT arrived -- RN notified of feeding concerns). Pt would benefit from additional instruction in self care skills and techniques to help maintain precautions with or without assistive devices to support recall and carryover prior to discharge. Recommend SNF upon discharge.    ? ?Recommendations for follow up therapy are one component of a multi-disciplinary discharge planning process, led by the attending physician.  Recommendations may be updated based on patient status, additional functional criteria and insurance authorization.  ? ?Follow Up Recommendations ? Skilled nursing-short term rehab (<3  hours/day)  ?  ?Assistance Recommended at Discharge Frequent or constant Supervision/Assistance  ?Patient can return home with the following A lot of help with walking and/or transfers;A lot of help with bathing/dressing/bathroom;Assistance with cooking/housework;Assist for transportation;Direct supervision/assist for medications management;Help with stairs or ramp for entrance;Assistance with feeding ? ?  ?Functional Status Assessment ? Patient has had a recent decline in their functional status and demonstrates the ability to make significant improvements in function in a reasonable and predictable amount of time.  ?Equipment Recommendations ? Other (comment) (defer to next venue)  ?  ?Recommendations for Other Services   ? ? ?  ?Precautions / Restrictions Precautions ?Precautions: Fall ?Restrictions ?Weight Bearing Restrictions: Yes ?RLE Weight Bearing: Weight bearing as tolerated  ? ?  ? ?Mobility Bed Mobility ?Overal bed mobility: Needs Assistance ?Bed Mobility: Supine to Sit, Sit to Supine ?  ?  ?Supine to sit: Mod assist, HOB elevated ?Sit to supine: Mod assist, +2 for physical assistance ?  ?  ?  ? ?Transfers ?  ?  ?  ?  ?  ?  ?  ?  ?  ?  ?  ? ?  ?Balance Overall balance assessment: Needs assistance ?Sitting-balance support: Feet supported ?Sitting balance-Leahy Scale: Poor ?Sitting balance - Comments: pt demo'd great difficulty with maintaining neutral midline seated positioning EOB with multiple LOB posteriorly and slightly to R lateral side, requiring SBA-MOD A to correct with VC for hand placement to hold himself up ?  ?  ?  ?  ?  ?  ?  ?  ?  ?  ?  ?  ?  ?  ?  ?   ? ?ADL  either performed or assessed with clinical judgement  ? ?ADL   ?  ?  ?  ?  ?  ?  ?  ?  ?  ?  ?  ?  ?  ?  ?  ?  ?  ?  ?  ?General ADL Comments: Pt currently requires MAX A for LB ADL tasks, Min A for seated EOB UB ADL tasks 2/2 decr balance, and unable to tolerate sitting EOB well for safe ADL transfer attempts.  ? ? ? ?Vision   ?   ?    ?Perception   ?  ?Praxis   ?  ? ?Pertinent Vitals/Pain Pain Assessment ?Pain Assessment: Faces ?Faces Pain Scale: Hurts even more  ? ? ? ?Hand Dominance   ?  ?Extremity/Trunk Assessment Upper Extremity Assessment ?Upper Extremity Assessment: Generalized weakness ?  ?Lower Extremity Assessment ?Lower Extremity Assessment: Generalized weakness ?  ?  ?  ?Communication   ?  ?Cognition Arousal/Alertness: Awake/alert ?Behavior During Therapy: [facility] for tasks assessed/performed ?Overall Cognitive Status: Within Functional Limits for tasks assessed ?  ?  ?  ?  ?  ?  ?  ?  ?  ?  ?  ?  ?  ?  ?  ?  ?  ?  ?  ?General Comments    ? ?  ?Exercises Other Exercises ?Other Exercises: Pt educated in bed moblity, static/dynamic sitting balance, strategies to minimize aspiration risk during meals (noted to cough/clear his throat several times at end of breakfast when OT arrived). ?  ?Shoulder Instructions    ? ? ?Home Living Family/patient expects to be discharged to:: Private residence ?Living Arrangements: Spouse/significant other ?Available Help at Discharge: Personal care attendant;Available PRN/intermittently ?Type of Home: House ?Home Access: Ramped entrance ?  ?  ?Home Layout: One level ?  ?  ?Bathroom Shower/Tub: Walk-in shower ?  ?Bathroom Toilet: Handicapped height ?  ?  ?Home Equipment: Cane - single Barista (2 wheels);BSC/3in1;Wheelchair - manual ?  ?Additional Comments: BSC and w/c are the spouse's equipment that she regularly uses ?  ? ?  ?Prior Functioning/Environment Prior Level of Function : Needs assist ?  ?  ?  ?  ?  ?  ?Mobility Comments: Per pt, he was indep wiht mobility, several falls in the past (no family/caregivers present, per previous chart review pt noted to be unreliable historian) ?ADLs Comments: Per pt, the PCA assisted wiht pt and spouse bathing, IADL, and assisting wife with dressing. Pt also helps wife wiht toilet transfers and briefs changes. he also notes that he still drives ?  ? ?  ?   ?OT Problem List: Decreased strength;Pain;Decreased range of motion;Decreased safety awareness;Decreased activity tolerance;Impaired balance (sitting and/or standing);Decreased knowledge of use of DME or AE ?  ?   ?OT Treatment/Interventions: Self-care/ADL training;Therapeutic exercise;Therapeutic activities;DME and/or AE instruction;Patient/family education;Balance training  ?  ?OT Goals(Current goals can be found in the care plan section) Acute Rehab OT Goals ?Patient Stated Goal: get better and go home to wife ?OT Goal Formulation: With patient ?Time For Goal Achievement: 09/30/21 ?Potential to Achieve Goals: Good ?ADL Goals ?Pt Will Perform Lower Body Dressing: with mod assist;sit to/from stand ?Pt Will Transfer to Toilet: with mod assist;stand pivot transfer (LRAD) ?Pt Will Perform Toileting - Clothing Manipulation and hygiene: sitting/lateral leans;with set-up;with min guard assist ?Additional ADL Goal #1: Pt will complete bed mobility with MIN A in anticipation of EOB/OOB ADL.  ?OT Frequency: Min 2X/week ?  ? ?Co-evaluation   ?  ?  ?  ?  ? ?  ?  AM-PAC OT "6 Clicks" Daily Activity     ?Outcome Measure Help from another person eating meals?: None ?Help from another person taking care of personal grooming?: A Little ?Help from another person toileting, which includes using toliet, bedpan, or urinal?: Total ?Help from another person bathing (including washing, rinsing, drying)?: A Lot ?Help from another person to put on and taking off regular upper body clothing?: A Lot ?Help from another person to put on and taking off regular lower body clothing?: A Lot ?6 Click Score: 14 ?  ?End of Session Nurse Communication: Mobility status ? ?Activity Tolerance: Patient limited by pain;Other (comment) (poor balance) ?Patient left: in bed;with call bell/phone within reach;with bed alarm set ? ?OT Visit Diagnosis: Other abnormalities of gait and mobility (R26.89);Muscle weakness (generalized) (M62.81);Pain ?Pain - Right/Left:  Right ?Pain - part of body: Hip  ?              ?Time: 4034-7425 ?OT Time Calculation (min): 29 min ?Charges:  OT General Charges ?$OT Visit: 1 Visit ?OT Evaluation ?$OT Eval Moderate Complexity: 1 Mod ?OT

## 2021-09-16 NOTE — Progress Notes (Addendum)
?  Progress Note ? ? ?Patient: Phillip Reyes JOA:[id number] DOB: 24-Aug-1940 DOA: 09/14/2021     2 ?DOS: the patient was seen and examined on 09/16/2021 ?  ?Brief hospital course: ?No notes on file ? ?Assessment and Plan: ?* Other fracture of right femur, initial encounter for closed fracture ([place]) ?S/p INTRAMEDULLARY (IM) NAIL on 5/11 ?--from accidental fall.  xray showed Comminuted displaced intertrochanteric fracture is seen in the ?proximal right femur. ?--Ortho consulted, with Dr. Rudene Christians ?Plan: ?--PT/OT ?--SNF rehab ? ?Dysphagia ?--SLP eval today, rec nectar-thick liquid ? ?Hyperkalemia ?--may be due to CKD4 ?--Kayexalate 30 g x2 ? ?CKD (chronic kidney disease) stage 4, GFR 15-29 ml/min (HCC) ?--Cr at baseline. ? ?Chronic systolic CHF (congestive heart failure) ([place]) ?--appeared compensated.  Last LVEF 40 to 45% in Feb 2023. ? ? ?H/O: stroke ?--Hold home ASA  ?--resume home statin after discharge ? ?COPD (chronic obstructive pulmonary disease) ([place]) ?--stable. ?--cont home Symbicort as Dulera ? ?HTN (hypertension) ?--hold home amlodipine since BP wnl ? ?Type 2 diabetes mellitus ([place]) ?--recent A1c 6.7, well controlled ?--hold home long-acting insulin for now ?--SSI for now ? ? ? ? ?  ? ?Subjective:  ?Hip pain improved after surgery. ? ?RN found pt to be coughing with eating and drinking.  SLP eval rec thickened liquid for now. ? ? ?Physical Exam: ? ?Constitutional: NAD, AAOx3 ?HEENT: conjunctivae and lids normal, EOMI ?CV: No cyanosis.   ?RESP: normal respiratory effort, on RA ?Extremities: No effusions, edema in BLE ?SKIN: warm, dry ?Neuro: II - XII grossly intact.   ?Psych: Normal mood and affect.  Appropriate judgement and reason ? ? ?Data Reviewed: ? ?Family Communication:  ? ?Disposition: ?Status is: Inpatient ? ? Planned Discharge Destination: Rehab ? ? ? ?Time spent: 25 minutes ? ?Author: ?Enzo Bi, MD ?09/16/2021 7:35 PM ? ?For on call review [email or web address].  ?

## 2021-09-16 NOTE — Evaluation (Signed)
Physical Therapy Evaluation ?Patient Details ?Name: Phillip Reyes ?MRN: [id number] ?DOB: [birth year] ?Today's Date: 09/16/2021 ? ?History of Present Illness ? Pt is 81 y.o. male who was admitted to [facility] after a fall, s/p R hip IM nailing. PMHx includes: Acute kidney disease, Anemia, HTN, CVA, PVD, and Dyslipidemia. Has had some hospitalizations recently. ?  ?Clinical Impression ? Patient wakes easily to voice, agreeable to PT. Reported some pain at rest but "20/10" pain after standing attempts. Pt's PLOF was independent for ambulation (though with some falls lately), some assist for IADLs from aide.  ? ?He was able to perform a few supine exercises with AAROM for RLE. The patient was modA for bed mobility, able to sit for a few minutes, fair balance once repositioned in midline. Sit <> stand with maxAx2 and RW, poor standing tolerance noted. Unable to successfully side step at EOB at this time due to pain, and weakness. Returned to supine modAx2 and repositioned in bed for comfort.  Overall the patient demonstrated deficits (see "PT Problem List") that impede the patient's functional abilities, safety, and mobility and would benefit from skilled PT intervention. Recommendation at this time is SNF due to current level of assistance needed and to maximize function.    ?   ? ?Recommendations for follow up therapy are one component of a multi-disciplinary discharge planning process, led by the attending physician.  Recommendations may be updated based on patient status, additional functional criteria and insurance authorization. ? ?Follow Up Recommendations Skilled nursing-short term rehab (<3 hours/day) ? ?  ?Assistance Recommended at Discharge    ?Patient can return home with the following ? Two people to help with walking and/or transfers;Two people to help with bathing/dressing/bathroom;Direct supervision/assist for medications management;Help with stairs or ramp for entrance;Assist for transportation;Assistance with  cooking/housework ? ?  ?Equipment Recommendations Other (comment) (TBD at next venue of care)  ?Recommendations for Other Services ?    ?  ?Functional Status Assessment Patient has had a recent decline in their functional status and demonstrates the ability to make significant improvements in function in a reasonable and predictable amount of time.  ? ?  ?Precautions / Restrictions Precautions ?Precautions: Fall ?Restrictions ?Weight Bearing Restrictions: Yes ?RLE Weight Bearing: Weight bearing as tolerated  ? ?  ? ?Mobility ? Bed Mobility ?Overal bed mobility: Needs Assistance ?Bed Mobility: Supine to Sit, Sit to Supine ?  ?  ?Supine to sit: Mod assist, HOB elevated ?Sit to supine: Mod assist, +2 for safety/equipment ?  ?  ?  ? ?Transfers ?Overall transfer level: Needs assistance ?Equipment used: Rolling walker (2 wheels) ?Transfers: Sit to/from Stand ?Sit to Stand: Max assist, +2 physical assistance ?  ?  ?  ?  ?  ?  ?  ? ?Ambulation/Gait ?  ?  ?  ?  ?  ?  ?  ?General Gait Details: unable at this time ? ?Stairs ?  ?  ?  ?  ?  ? ?Wheelchair Mobility ?  ? ?Modified Rankin (Stroke Patients Only) ?  ? ?  ? ?Balance Overall balance assessment: Needs assistance ?Sitting-balance support: Feet supported ?Sitting balance-Leahy Scale: Fair ?Sitting balance - Comments: once positioned in midline pt able to maintain seated balance with and without BUE support ?  ?Standing balance support: Reliant on assistive device for balance ?Standing balance-Leahy Scale: Poor ?  ?  ?  ?  ?  ?  ?  ?  ?  ?  ?  ?  ?   ? ? ? ?  Pertinent Vitals/Pain Pain Assessment ?Pain Assessment: 0-10 ?Pain Score: 10-Worst pain ever ?Pain Location: 20/10 in R hip after mobilizing ?Pain Descriptors / Indicators: Grimacing, Sore, Aching, Pressure ?Pain Intervention(s): Limited activity within patient's tolerance, Monitored during session, Repositioned  ? ? ?Home Living Family/patient expects to be discharged to:: Private residence ?Living Arrangements:  Spouse/significant other ?Available Help at Discharge: Personal care attendant;Available PRN/intermittently ?Type of Home: House ?Home Access: Ramped entrance ?  ?  ?  ?Home Layout: One level ?Home Equipment: Cane - single Barista (2 wheels);BSC/3in1;Wheelchair - manual ?Additional Comments: BSC and w/c are the spouse's equipment that she regularly uses  ?  ?Prior Function Prior Level of Function : Needs assist ?  ?  ?  ?  ?  ?  ?Mobility Comments: Per pt, he was indep wiht mobility, several falls in the past (no family/caregivers present, per previous chart review pt noted to be unreliable historian) ?ADLs Comments: Per pt, the PCA assisted wiht pt and spouse bathing, IADL, and assisting wife with dressing. Pt also helps wife wiht toilet transfers and briefs changes. he also notes that he still drives ?  ? ? ?Hand Dominance  ?   ? ?  ?Extremity/Trunk Assessment  ? Upper Extremity Assessment ?Upper Extremity Assessment: Generalized weakness ?  ? ?Lower Extremity Assessment ?Lower Extremity Assessment: Generalized weakness ?  ? ?   ?Communication  ?    ?Cognition Arousal/Alertness: Awake/alert ?Behavior During Therapy: [facility] for tasks assessed/performed ?Overall Cognitive Status: Within Functional Limits for tasks assessed ?  ?  ?  ?  ?  ?  ?  ?  ?  ?  ?  ?  ?  ?  ?  ?  ?  ?  ?  ? ?  ?General Comments   ? ?  ?Exercises    ? ?Assessment/Plan  ?  ?PT Assessment Patient needs continued PT services  ?PT Problem List Decreased strength;Decreased mobility;Decreased range of motion;Decreased knowledge of precautions;Decreased activity tolerance;Decreased balance;Decreased knowledge of use of DME;Pain ? ?   ?  ?PT Treatment Interventions DME instruction;Therapeutic exercise;Gait training;Balance training;Stair training;Neuromuscular re-education;Functional mobility training;Therapeutic activities;Patient/family education   ? ?PT Goals (Current goals can be found in the Care Plan section)  ?Acute Rehab PT  Goals ?Patient Stated Goal: to have less pain ?PT Goal Formulation: With patient ?Time For Goal Achievement: 09/30/21 ?Potential to Achieve Goals: Good ? ?  ?Frequency 7X/week ?  ? ? ?Co-evaluation   ?  ?  ?  ?  ? ? ?  ?AM-PAC PT "6 Clicks" Mobility  ?Outcome Measure Help needed turning from your back to your side while in a flat bed without using bedrails?: A Lot ?Help needed moving from lying on your back to sitting on the side of a flat bed without using bedrails?: A Lot ?Help needed moving to and from a bed to a chair (including a wheelchair)?: A Lot ?Help needed standing up from a chair using your arms (e.g., wheelchair or bedside chair)?: A Lot ?Help needed to walk in hospital room?: Total ?Help needed climbing 3-5 steps with a railing? : Total ?6 Click Score: 10 ? ?  ?End of Session Equipment Utilized During Treatment: Gait belt ?Activity Tolerance: Patient limited by pain ?Patient left: in bed;with call bell/phone within reach;with bed alarm set ?Nurse Communication: Mobility status ?PT Visit Diagnosis: Difficulty in walking, not elsewhere classified (R26.2);Other abnormalities of gait and mobility (R26.89);Muscle weakness (generalized) (M62.81);Pain ?Pain - Right/Left: Right ?Pain - part of body: Hip ?  ? ?  Time: 1388-7195 ?PT Time Calculation (min) (ACUTE ONLY): 18 min ? ? ?Charges:   PT Evaluation ?$PT Eval Low Complexity: 1 Low ?PT Treatments ?$Therapeutic Activity: 8-22 mins ?  ?   ? ?Lieutenant Diego PT, DPT ?2:18 PM,09/16/21 ? ? ?

## 2021-09-16 NOTE — Evaluation (Signed)
Clinical/Bedside Swallow Evaluation ?Patient Details  ?Name: Phillip Reyes ?MRN: [id number] ?Date of Birth: Sep 13, 1940 ? ?Today's Date: 09/16/2021 ?Time: SLP Start Time (ACUTE ONLY): 1325 SLP Stop Time (ACUTE ONLY): 1700 ?SLP Time Calculation (min) (ACUTE ONLY): 20 min ? ?Past Medical History:  ?Past Medical History:  ?Diagnosis Date  ? Arthritis   ? Cancer [facility])   ? COPD (chronic obstructive pulmonary disease) ([place])   ? Diabetes mellitus without complication ([place])   ? Hypertension   ? Renal disorder   ? Stroke [facility])   ? ?Past Surgical History:  ?Past Surgical History:  ?Procedure Laterality Date  ? APPENDECTOMY    ? INTRAMEDULLARY (IM) NAIL INTERTROCHANTERIC Right 09/15/2021  ? Procedure: INTRAMEDULLARY (IM) NAIL INTERTROCHANTRIC;  Surgeon: Hessie Knows, MD;  Location: [facility] ORS;  Service: Orthopedics;  Laterality: Right;  ? THROAT SURGERY    ? ?HPI:  ?Pt is 81 y.o. male who was admitted to [facility] after a fall, s/p R hip IM nailing. PMHx includes: Acute kidney disease, Anemia, HTN, CVA, PVD, and Dyslipidemia. Has had some hospitalizations recently. Pt with history of dysphagia and benefited from nectar thick liquids during previous hosptialization on 06/2021.  ?  ?Assessment / Plan / Recommendation  ?Clinical Impression ? Pt presents with adequate oropharyngeal abilities when consumin thin liquids via cup, nectar thick liquids via cup, puree and solids. However per report, pt has been coughing when consuming thin liquids. Pt stated that the he felt he had "better control" when consuming the nectar as it didn't "rush to back of my throat." At this time, out of an abundance of caution, recommend regular diet with nectar thick liquids via cup, medicine whole with nectar thick liquids. ST to follow for possible return to thins as pt improves medically. ?SLP Visit Diagnosis: Dysphagia, oropharyngeal phase (R13.12) ?   ?Aspiration Risk ? Mild aspiration risk  ?  ?Diet Recommendation Regular;Nectar-thick liquid  ? ?Liquid  Administration via: Cup ?Medication Administration: Whole meds with liquid ?Supervision: Patient able to self feed ?Compensations: Minimize environmental distractions;Slow rate;Small sips/bites ?Postural Changes: Seated upright at 90 degrees  ?  ?Other  Recommendations Oral Care Recommendations: Oral care BID ?Other Recommendations: Remove water pitcher;Prohibited food (jello, ice cream, thin soups);Order thickener from pharmacy   ? ?Recommendations for follow up therapy are one component of a multi-disciplinary discharge planning process, led by the attending physician.  Recommendations may be updated based on patient status, additional functional criteria and insurance authorization. ? ?Follow up Recommendations Follow physician's recommendations for discharge plan and follow up therapies  ? ? ?  ?Assistance Recommended at Discharge    ?Functional Status Assessment Patient has had a recent decline in their functional status and demonstrates the ability to make significant improvements in function in a reasonable and predictable amount of time.  ?Frequency and Duration min 2x/week  ?2 weeks ?  ?   ? ?Prognosis Prognosis for Safe Diet Advancement: Good  ? ?  ? ?Swallow Study   ?General Date of Onset: 09/16/21 ?HPI: Pt is 81 y.o. male who was admitted to [facility] after a fall, s/p R hip IM nailing. PMHx includes: Acute kidney disease, Anemia, HTN, CVA, PVD, and Dyslipidemia. Has had some hospitalizations recently. Pt with history of dysphagia and benefited from nectar thick liquids during previous hosptialization on 06/2021. ?Type of Study: Bedside Swallow Evaluation ?Previous Swallow Assessment: 06/2021 ?Diet Prior to this Study: Regular;Thin liquids ?Temperature Spikes Noted: No ?Respiratory Status: Room air ?History of Recent Intubation: No ?Behavior/Cognition: Alert;Cooperative;Pleasant mood ?Oral Cavity Assessment: Within  Functional Limits ?Oral Care Completed by SLP: No ?Oral Cavity - Dentition: Dentures,  top;Dentures, bottom ?Vision: Functional for self-feeding ?Self-Feeding Abilities: Able to feed self ?Patient Positioning: Upright in bed ?Baseline Vocal Quality: Normal ?Volitional Cough: Strong ?Volitional Swallow: Able to elicit  ?  ?Oral/Motor/Sensory Function Overall Oral Motor/Sensory Function: Within functional limits   ?Ice Chips Ice chips: Not tested   ?Thin Liquid Thin Liquid: Within functional limits ?Presentation: Cup  ?  ?Nectar Thick Nectar Thick Liquid: Within functional limits ?Presentation: Cup;Self Fed   ?Honey Thick Honey Thick Liquid: Not tested   ?Puree Puree: Within functional limits ?Presentation: Self Fed;Spoon   ?Solid ? ? ?  Solid: Within functional limits ?Presentation: Self Fed  ? ?  ?Jamorris Ndiaye B. Rutherford Nail, M.S., CCC-SLP, CBIS ?Speech-Language Pathologist ?Rehabilitation Services ?Office [phone] ? ?Devorah Givhan ?09/16/2021,3:13 PM ? ? ? ?

## 2021-09-16 NOTE — Anesthesia Postprocedure Evaluation (Signed)
Anesthesia Post Note ? ?Patient: Phillip Reyes ? ?Procedure(s) Performed: INTRAMEDULLARY (IM) NAIL INTERTROCHANTRIC (Right: Hip) ? ?Patient location during evaluation: Nursing Unit ?Anesthesia Type: Spinal ?Level of consciousness: oriented and awake and alert ?Pain management: pain level controlled ?Vital Signs Assessment: post-procedure vital signs reviewed and stable ?Respiratory status: spontaneous breathing and respiratory function stable ?Cardiovascular status: blood pressure returned to baseline and stable ?Postop Assessment: no headache, no backache, no apparent nausea or vomiting and patient able to bend at knees ?Anesthetic complications: no ? ? ?No notable events documented. ? ? ?Last Vitals:  ?Vitals:  ? 09/15/21 2323 09/16/21 0449  ?BP: (!) 134/58 118/64  ?Pulse: 81 77  ?Resp: 14 17  ?Temp:  36.8 ?C  ?SpO2: 91% 92%  ?  ?Last Pain:  ?Vitals:  ? 09/16/21 0559  ?TempSrc:   ?PainSc: 0-No pain  ? ? ?  ?  ?  ?  ?  ?  ? ?Lia Foyer ? ? ? ? ?

## 2021-09-17 DIAGNOSIS — R410 Disorientation, unspecified: Secondary | ICD-10-CM

## 2021-09-17 DIAGNOSIS — S728X1A Other fracture of right femur, initial encounter for closed fracture: Secondary | ICD-10-CM | POA: Diagnosis not present

## 2021-09-17 LAB — CBC
HCT: 24.8 % — ABNORMAL LOW (ref 39.0–52.0)
Hemoglobin: 8.2 g/dL — ABNORMAL LOW (ref 13.0–17.0)
MCH: 31.2 pg (ref 26.0–34.0)
MCHC: 33.1 g/dL (ref 30.0–36.0)
MCV: 94.3 fL (ref 80.0–100.0)
Platelets: 146 10*3/uL — ABNORMAL LOW (ref 150–400)
RBC: 2.63 MIL/uL — ABNORMAL LOW (ref 4.22–5.81)
RDW: 13.1 % (ref 11.5–15.5)
WBC: 10.8 10*3/uL — ABNORMAL HIGH (ref 4.0–10.5)
nRBC: 0 % (ref 0.0–0.2)

## 2021-09-17 LAB — BASIC METABOLIC PANEL
Anion gap: 6 (ref 5–15)
BUN: 47 mg/dL — ABNORMAL HIGH (ref 8–23)
CO2: 24 mmol/L (ref 22–32)
Calcium: 8 mg/dL — ABNORMAL LOW (ref 8.9–10.3)
Chloride: 106 mmol/L (ref 98–111)
Creatinine, Ser: 3.99 mg/dL — ABNORMAL HIGH (ref 0.61–1.24)
GFR, Estimated: 14 mL/min — ABNORMAL LOW (ref >60)
Glucose, Bld: 194 mg/dL — ABNORMAL HIGH (ref 70–99)
Potassium: 3.9 mmol/L (ref 3.5–5.1)
Sodium: 136 mmol/L (ref 135–145)

## 2021-09-17 LAB — GLUCOSE, CAPILLARY
Glucose-Capillary: 181 mg/dL — ABNORMAL HIGH (ref 70–99)
Glucose-Capillary: 190 mg/dL — ABNORMAL HIGH (ref 70–99)
Glucose-Capillary: 161 mg/dL — ABNORMAL HIGH (ref 70–99)
Glucose-Capillary: 143 mg/dL — ABNORMAL HIGH (ref 70–99)

## 2021-09-17 LAB — MAGNESIUM: Magnesium: 2.2 mg/dL (ref 1.7–2.4)

## 2021-09-17 MED ORDER — IPRATROPIUM-ALBUTEROL 0.5-2.5 (3) MG/3ML IN SOLN
3.0000 mL | Freq: Four times a day (QID) | RESPIRATORY_TRACT | Status: DC
  Administered 2021-09-17 (×2): 3 mL via RESPIRATORY_TRACT
  Filled 2021-09-17 (×2): qty 3

## 2021-09-17 MED ORDER — LACTATED RINGERS IV SOLN: INTRAVENOUS | Status: AC

## 2021-09-17 MED ORDER — CARVEDILOL 3.125 MG PO TABS
6.2500 mg | ORAL_TABLET | Freq: Two times a day (BID) | ORAL | Status: DC
Start: 2021-09-17 — End: 2021-09-23
  Administered 2021-09-17 – 2021-09-23 (×12): 6.25 mg via ORAL
  Filled 2021-09-17 (×11): qty 2

## 2021-09-17 MED ORDER — OXYCODONE-ACETAMINOPHEN 5-325 MG PO TABS: 1.0000 | ORAL_TABLET | Freq: Four times a day (QID) | ORAL | Status: DC | PRN

## 2021-09-17 NOTE — Assessment & Plan Note (Addendum)
--  Cr went from 3.18 to 4.37 ?--s/p LR'@50'$  for 20 hours x2, Cr slowly trending down ?Plan: ?--encourage oral hydration  ?

## 2021-09-17 NOTE — Assessment & Plan Note (Addendum)
--  limit opioids use ?

## 2021-09-17 NOTE — Progress Notes (Signed)
Subjective: ?2 Days Post-Op Procedure(s) (LRB): ?INTRAMEDULLARY (IM) NAIL INTERTROCHANTRIC (Right) ?Patient reports pain as moderate.   ?Patient is confused this morning, removed his IV when I entered the room. ?Denies any CP, SOB, ABD pain. ?We will continue therapy today.   Will need SNF upon discharge. ? ?Objective: ?Vital signs in last 24 hours: ?Temp:  [98.1 ?F (36.7 ?C)-99 ?F (37.2 ?C)] 98.6 ?F (37 ?C) (05/13 0413) ?Pulse Rate:  [68-77] 75 (05/13 0413) ?Resp:  [14-18] 18 (05/13 0413) ?BP: (110-125)/(51-53) 125/53 (05/13 0413) ?SpO2:  [90 %-92 %] 90 % (05/13 0413) ? ?Intake/Output from previous day: ?05/12 0701 - 05/13 0700 ?In: 118 [P.O.:118] ?Out: 374 [Urine:374] ?Intake/Output this shift: ?No intake/output data recorded. ? ?Recent Labs  ?  09/14/21 ?1629 09/15/21 ?0734 09/16/21 ?5003 09/17/21 ?7048  ?HGB 9.6* 10.3* 8.5* 8.2*  ? ?Recent Labs  ?  09/16/21 ?8891 09/17/21 ?6945  ?WBC 9.1 10.8*  ?RBC 2.83* 2.63*  ?HCT 26.6* 24.8*  ?PLT 140* 146*  ? ?Recent Labs  ?  09/16/21 ?0388 09/17/21 ?8280  ?NA 138 136  ?K 4.1 3.9  ?CL 108 106  ?CO2 24 24  ?BUN 50* 47*  ?CREATININE 3.18* 3.99*  ?GLUCOSE 195* 194*  ?CALCIUM 8.0* 8.0*  ? ?No results for input(s): LABPT, INR in the last 72 hours. ? ?EXAM ?General - Patient is Disorganized and Confused ?Extremity - ABD soft ?Neurovascular intact ?Dorsiflexion/Plantar flexion intact ?Incision: scant drainage ?No cellulitis present ?Dressing - Mild bloody drainage noted to the right proximal hip incision. ?Motor Function - intact, moving foot and toes well on exam.  ?ABdomen soft, negative Homans to bilateral lower extremities. ? ?Past Medical History:  ?Diagnosis Date  ? Arthritis   ? Cancer [facility])   ? COPD (chronic obstructive pulmonary disease) ([place])   ? Diabetes mellitus without complication ([place])   ? Hypertension   ? Renal disorder   ? Stroke [facility])   ? ? ?Assessment/Plan:   ?2 Days Post-Op Procedure(s) (LRB): ?INTRAMEDULLARY (IM) NAIL INTERTROCHANTRIC (Right) ?Principal  Problem: ?  Other fracture of right femur, initial encounter for closed fracture ([place]) ?Active Problems: ?  Type 2 diabetes mellitus ([place]) ?  HTN (hypertension) ?  COPD (chronic obstructive pulmonary disease) ([place]) ?  H/O: stroke ?  Chronic systolic CHF (congestive heart failure) ([place]) ?  CKD (chronic kidney disease) stage 4, GFR 15-29 ml/min (HCC) ?  Hyperkalemia ?  Dysphagia ? ?Estimated body mass index is 22.96 kg/m? as calculated from the following: ?  Height as of this encounter: '5\' 10"'$  (1.778 m). ?  Weight as of this encounter: 72.6 kg. ?Advance diet ?Up with therapy ? ?Labs and vitals reviewed this morning.  Hg 8.2, WBC 10.8. No recent fevers/chills. ?Denies any urinary symptoms this morning. ?Patient more confused this morning, removed his IV.  Attempt to limit narcotics today. ?Patient has had a BM. ?Up with therapy today, plan for SNF upon discharge. ? ?DVT Prophylaxis - Lovenox, TED hose, and SCDs ?Weight-Bearing as tolerated to right leg ? ?Raquel Dixie Coppa, PA-C ?[place] ?09/17/2021, 8:18 AM ? ? ?

## 2021-09-17 NOTE — TOC CM/SW Note (Signed)
Per chart review patient is disoriented. ?Left VM for spouse requesting return call to discuss SNF. ? ?Oleh Genin, LCSW ?[phone] ? ?

## 2021-09-17 NOTE — Progress Notes (Addendum)
?  Progress Note ? ? ?Patient: Phillip Reyes OXB:[id number] DOB: 05-29-1940 DOA: 09/14/2021     3 ?DOS: the patient was seen and examined on 09/17/2021 ?  ?Brief hospital course: ?No notes on file ? ?Assessment and Plan: ?* Other fracture of right femur, initial encounter for closed fracture ([place]) ?S/p INTRAMEDULLARY (IM) NAIL on 5/11 ?--from accidental fall.  xray showed Comminuted displaced intertrochanteric fracture is seen in the ?proximal right femur. ?--Ortho consulted, with Dr. Rudene Christians ?Plan: ?--PT/OT ?--SNF rehab ? ?Delirium ?--becoming more lethargic, disoriented  ?--limit opioids use ? ?Dysphagia ?--SLP eval today, rec nectar-thick liquid ? ?Hyperkalemia ?--may be due to CKD4 ?--Kayexalate 30 g x2 ? ?CKD (chronic kidney disease) stage 4, GFR 15-29 ml/min (HCC) ?--Cr at baseline. ? ?Chronic systolic CHF (congestive heart failure) ([place]) ?--appeared compensated.  Last LVEF 40 to 45% in Feb 2023. ? ? ?AKI (acute kidney injury) ([place]) ?--Cr went from 3.18 to 3.99 ?--start LR'@50'$  for 20 hours ? ?H/O: stroke ?--Hold home ASA  ?--resume home statin after discharge ? ?COPD (chronic obstructive pulmonary disease) ([name]) ?--stable. ?--cont home Symbicort as Dulera ?--add DuoNeb, flutter valve and chest PT today ? ?HTN (hypertension) ?--hold home amlodipine since BP wnl ? ?Type 2 diabetes mellitus ([place]) ?--recent A1c 6.7, well controlled ?--hold home long-acting insulin for now ?--SSI for now ? ? ? ? ?  ? ?Subjective:  ?Pt was found asleep.  Woke up but wasn't interactive.   ? ?Cr went from 3.18 to 3.99, started on gentle MIVF. ? ? ?Physical Exam: ? ?Constitutional: NAD, sleepy, arousable, sitting in recliner, not interactive today ?HEENT: conjunctivae and lids normal, EOMI ?CV: No cyanosis.   ?RESP: gurgling, loud rhonchi ?SKIN: warm, dry ? ? ?Data Reviewed: ? ?Family Communication:  ? ?Disposition: ?Status is: Inpatient ? ? Planned Discharge Destination: Rehab ? ? ? ?Time spent: 35 minutes ? ?Author: ?Enzo Bi, MD ?09/17/2021  3:26 PM ? ?For on call review [email or web address].  ?

## 2021-09-17 NOTE — Plan of Care (Signed)

## 2021-09-17 NOTE — Progress Notes (Addendum)
1812 ?Pt being sleepy and lethargic all day. Dr Billie Ruddy aware. Pt received '5mg'$  of percocet at 819am. ?Nurse educated pt on IS and he can reach 733m falling to sleep shortly after. VSS. Will continue to monitor pt. ? ?19191?Bladder scan showed 5813mof urine. Pt straight cath x1 with another RN to witness.50059mf urine noted.  ?

## 2021-09-17 NOTE — Progress Notes (Signed)
Patient had a mucousy, yellow stool that smelled strongly of fish. Suspected C-Diff. Consulted with charge. Since this is his first bowel movement, charge stated that he did not meet the testing criteria. Collected specimen and left on sink in case day team wanted to test. Patient stated that he had been on antibiotics previously for 80 days.  ?

## 2021-09-17 NOTE — Progress Notes (Signed)
Physical Therapy Treatment ?Patient Details ?Name: Phillip Reyes ?MRN: [id number] ?DOB: Dec 20, 1940 ?Today's Date: 09/17/2021 ? ? ?History of Present Illness Pt is 81 y.o. male who was admitted to [facility] after a fall, s/p R hip IM nailing. PMHx includes: Acute kidney disease, Anemia, HTN, CVA, PVD, and Dyslipidemia. Has had some hospitalizations recently. ? ?  ?PT Comments  ? ? Author contacted by RN staff for assistance with getting pt OOB. Upon entry, pt was long sitting in bed disoriented to situation, time, and endorsing pain. He is cooperative throughout session btu limited severely by pain and cognition. He was able to exit R side of bed with increased time and mod assist. +2 assistance for safety to stand pivot to recliner. Pt requires extensive amount of time due to pain. He is able to follow commands but needs vcs/tcs throughout. Reviewed importance of continued exercises to promote strength and return in function. Highly recommend DC to SNF to assist pt to PLOF while maximizing independence with ADLs.  ?  ?Recommendations for follow up therapy are one component of a multi-disciplinary discharge planning process, led by the attending physician.  Recommendations may be updated based on patient status, additional functional criteria and insurance authorization. ? ?Follow Up Recommendations ? Skilled nursing-short term rehab (<3 hours/day) ?  ?  ?Assistance Recommended at Discharge Frequent or constant Supervision/Assistance  ?Patient can return home with the following Two people to help with walking and/or transfers;Two people to help with bathing/dressing/bathroom;Direct supervision/assist for medications management;Help with stairs or ramp for entrance;Assist for transportation;Assistance with cooking/housework ?  ?Equipment Recommendations ? Other (comment) (ongoing assessment)  ?  ?   ?Precautions / Restrictions Precautions ?Precautions: Fall ?Restrictions ?Weight Bearing Restrictions: Yes ?RLE Weight Bearing:  Weight bearing as tolerated  ?  ? ?Mobility ? Bed Mobility ?Overal bed mobility: Needs Assistance ?Bed Mobility: Supine to Sit ?  ?  ?Supine to sit: Mod assist, Max assist, HOB elevated ?  ?  ?General bed mobility comments: Increased time to perform due to pain. Vcs for sequencing and technique. ?  ? ?Transfers ?Overall transfer level: Needs assistance ?Equipment used: Rolling walker (2 wheels) ?Transfers: Sit to/from Stand ?Sit to Stand: Mod assist, From elevated surface, +2 safety/equipment ?  ?  ?  ?  ?  ?General transfer comment: pt was able to stand from elevated bed height with mod assist + max vcs. 2nd person SBA for safety. Pt was able to stand and pivot towards L to recliner. pain limiting throughout session. ?  ? ?Ambulation/Gait ?   ?General Gait Details: pt's pain limits his abilities to ambulate. yells out in pain with any wt bearing of RLE ? ? ?  ?Balance Overall balance assessment: Needs assistance ?Sitting-balance support: Feet supported ?Sitting balance-Leahy Scale: Poor ?Sitting balance - Comments: posterior L lean due to pain ?  ?Standing balance support: Reliant on assistive device for balance ?Standing balance-Leahy Scale: Poor ?Standing balance comment: high fall risk ?  ?  ?  ?Cognition Arousal/Alertness: Awake/alert ?Behavior During Therapy: [facility] for tasks assessed/performed ?Overall Cognitive Status: No family/caregiver present to determine baseline cognitive functioning ?  ?   ?General Comments: Pt is confused. only oriented to self and that he broke his hip. He was unware he was in the hospital. lacks insight of overall situation ?  ?  ? ?  ?   ?General Comments General comments (skin integrity, edema, etc.): reviewed importance of continued exercises and movement. Author will return to assist pt with getting back into bed  later this date. ?  ?  ? ?Pertinent Vitals/Pain Pain Assessment ?Pain Assessment: 0-10 ?Pain Score: 7  ?Faces Pain Scale: Hurts whole lot ?Pain Location: moans ?Pain  Descriptors / Indicators: Grimacing, Sore, Aching, Pressure ?Pain Intervention(s): Limited activity within patient's tolerance, Premedicated before session, Monitored during session, Repositioned, Ice applied  ? ? ? ?PT Goals (current goals can now be found in the care plan section) Acute Rehab PT Goals ?Patient Stated Goal: have less pain ?Progress towards PT goals: Progressing toward goals ? ?  ?Frequency ? ? ? 7X/week ? ? ? ?  ?PT Plan Current plan remains appropriate  ? ? ?   ?AM-PAC PT "6 Clicks" Mobility   ?Outcome Measure ? Help needed turning from your back to your side while in a flat bed without using bedrails?: A Lot ?Help needed moving from lying on your back to sitting on the side of a flat bed without using bedrails?: A Lot ?Help needed moving to and from a bed to a chair (including a wheelchair)?: A Lot ?Help needed standing up from a chair using your arms (e.g., wheelchair or bedside chair)?: A Lot ?Help needed to walk in hospital room?: Total ?Help needed climbing 3-5 steps with a railing? : Total ?6 Click Score: 10 ? ?  ?End of Session   ?Activity Tolerance: Patient limited by pain ?Patient left: with call bell/phone within reach;in chair;with nursing/sitter in room ?Nurse Communication: Mobility status ?PT Visit Diagnosis: Difficulty in walking, not elsewhere classified (R26.2);Other abnormalities of gait and mobility (R26.89);Muscle weakness (generalized) (M62.81);Pain ?Pain - Right/Left: Right ?Pain - part of body: Hip ?  ? ? ?Time: 3295-1884 ?PT Time Calculation (min) (ACUTE ONLY): 25 min ? ?Charges:  $Therapeutic Exercise: 8-22 mins ?$Therapeutic Activity: 8-22 mins          ?          ? ?Julaine Fusi PTA ?09/17/21, 10:30 AM  ? ?

## 2021-09-18 DIAGNOSIS — S728X1A Other fracture of right femur, initial encounter for closed fracture: Secondary | ICD-10-CM | POA: Diagnosis not present

## 2021-09-18 DIAGNOSIS — D649 Anemia, unspecified: Secondary | ICD-10-CM

## 2021-09-18 LAB — CBC
HCT: 21.3 % — ABNORMAL LOW (ref 39.0–52.0)
Hemoglobin: 7 g/dL — ABNORMAL LOW (ref 13.0–17.0)
MCH: 30.8 pg (ref 26.0–34.0)
MCHC: 32.9 g/dL (ref 30.0–36.0)
MCV: 93.8 fL (ref 80.0–100.0)
Platelets: 127 10*3/uL — ABNORMAL LOW (ref 150–400)
RBC: 2.27 MIL/uL — ABNORMAL LOW (ref 4.22–5.81)
RDW: 13.2 % (ref 11.5–15.5)
WBC: 9.3 10*3/uL (ref 4.0–10.5)
nRBC: 0 % (ref 0.0–0.2)

## 2021-09-18 LAB — BASIC METABOLIC PANEL
Anion gap: 14 (ref 5–15)
BUN: 49 mg/dL — ABNORMAL HIGH (ref 8–23)
CO2: 22 mmol/L (ref 22–32)
Calcium: 7.9 mg/dL — ABNORMAL LOW (ref 8.9–10.3)
Chloride: 102 mmol/L (ref 98–111)
Creatinine, Ser: 4.31 mg/dL — ABNORMAL HIGH (ref 0.61–1.24)
GFR, Estimated: 13 mL/min — ABNORMAL LOW (ref >60)
Glucose, Bld: 162 mg/dL — ABNORMAL HIGH (ref 70–99)
Potassium: 3.7 mmol/L (ref 3.5–5.1)
Sodium: 138 mmol/L (ref 135–145)

## 2021-09-18 LAB — BPAM RBC
Blood Product Expiration Date: 202306082359
Unit Type and Rh: 5100

## 2021-09-18 LAB — TYPE AND SCREEN
ABO/RH(D): O POS
Antibody Screen: NEGATIVE
Unit division: 0

## 2021-09-18 LAB — GLUCOSE, CAPILLARY
Glucose-Capillary: 168 mg/dL — ABNORMAL HIGH (ref 70–99)
Glucose-Capillary: 140 mg/dL — ABNORMAL HIGH (ref 70–99)
Glucose-Capillary: 185 mg/dL — ABNORMAL HIGH (ref 70–99)
Glucose-Capillary: 139 mg/dL — ABNORMAL HIGH (ref 70–99)

## 2021-09-18 LAB — PREPARE RBC (CROSSMATCH)

## 2021-09-18 LAB — MAGNESIUM: Magnesium: 2.1 mg/dL (ref 1.7–2.4)

## 2021-09-18 LAB — HEMOGLOBIN AND HEMATOCRIT, BLOOD
HCT: 25.2 % — ABNORMAL LOW (ref 39.0–52.0)
Hemoglobin: 8.3 g/dL — ABNORMAL LOW (ref 13.0–17.0)

## 2021-09-18 MED ORDER — IPRATROPIUM-ALBUTEROL 0.5-2.5 (3) MG/3ML IN SOLN
3.0000 mL | Freq: Three times a day (TID) | RESPIRATORY_TRACT | Status: DC
  Administered 2021-09-18 – 2021-09-19 (×5): 3 mL via RESPIRATORY_TRACT
  Filled 2021-09-18 (×5): qty 3

## 2021-09-18 MED ORDER — SODIUM CHLORIDE 0.9% IV SOLUTION: Freq: Once | INTRAVENOUS | Status: AC

## 2021-09-18 MED ORDER — ACETAMINOPHEN 500 MG PO TABS
1000.0000 mg | ORAL_TABLET | Freq: Three times a day (TID) | ORAL | Status: DC | PRN
Start: 2021-09-18 — End: 2021-09-23
  Administered 2021-09-18 – 2021-09-23 (×4): 1000 mg via ORAL
  Filled 2021-09-18 (×4): qty 2

## 2021-09-18 NOTE — NC FL2 (Signed)
?[place] MEDICAID FL2 LEVEL OF CARE SCREENING TOOL  ?  ? ?IDENTIFICATION  ?Patient Name: ?Phillip Reyes Birthdate: 01/19/1941 Sex: male Admission Date (Current Location): ?09/14/2021  ?[place] and [place] Number: ? [place] ?  Facility and Address:  ?[facility], [address], [place], [place] [address] ?     Provider Number: ?[id number]  ?Attending Physician Name and Address:  ?Enzo Bi, MD ? Relative Name and Phone Number:  ?Beezley,Barbara B (Spouse)   [phone] [facility]) ?   ?Current Level of Care: ?Hospital Recommended Level of Care: ?[place] Prior Approval Number: ?  ? ?Date Approved/Denied: ?  PASRR Number: ?[id number] A ? ?Discharge Plan: ?  ?  ? ?Current Diagnoses: ?Patient Active Problem List  ? Diagnosis Date Noted  ? Delirium 09/17/2021  ? Dysphagia 09/16/2021  ? Other fracture of right femur, initial encounter for closed fracture ([place]) 09/14/2021  ? Chronic systolic CHF (congestive heart failure) ([place]) 09/14/2021  ? CKD (chronic kidney disease) stage 4, GFR 15-29 ml/min (HCC) 09/14/2021  ? Hyperkalemia 09/14/2021  ? Enterococcus UTI 06/27/2021  ? Aspiration pneumonia ([place]) 06/27/2021  ? AKI (acute kidney injury) ([place]) 06/27/2021  ? Acute kidney injury ([place])   ? Malnutrition of moderate degree 06/24/2021  ? Monocytosis   ? Acute respiratory failure ([place]) 06/22/2021  ? COPD with acute exacerbation ([place]) 04/16/2020  ? CAP (community acquired pneumonia) 01/27/2019  ? Altered mental status 03/09/2016  ? Acute encephalopathy 06/11/2015  ? Type 2 diabetes mellitus ([place]) 06/11/2015  ? HTN (hypertension) 06/11/2015  ? COPD (chronic obstructive pulmonary disease) ([place]) 06/11/2015  ? CKD (chronic kidney disease), stage III ([place]) 06/11/2015  ? H/O: stroke 06/11/2015  ? ? ?Orientation RESPIRATION BLADDER Height & Weight   ?  ?Self, Time ? Normal Continent Weight: 160 lb (72.6 kg) ?Height:  '5\' 10"'$  (177.8 cm)  ?BEHAVIORAL SYMPTOMS/MOOD NEUROLOGICAL BOWEL NUTRITION STATUS   ?    Incontinent Diet (carb modified; nectar thick)  ?AMBULATORY STATUS COMMUNICATION OF NEEDS Skin   ?Extensive Assist Verbally Surgical wounds (incision R hip; dry skin) ?  ?  ?  ?    ?     ?     ? ? ?Personal Care Assistance Level of Assistance  ?Bathing, Feeding, Dressing Bathing Assistance: Maximum assistance ?Feeding assistance: Limited assistance ?Dressing Assistance: Maximum assistance ?   ? ?Functional Limitations Info  ?    ?  ?   ? ? ?SPECIAL CARE FACTORS FREQUENCY  ?PT (By licensed PT), OT (By licensed OT), Speech therapy   ?  ?PT Frequency: 5 times per week ?OT Frequency: 5 times per week ?  ?  ?Speech Therapy Frequency: 2 times per week ?   ? ? ?Contractures    ? ? ?Additional Factors Info  ?Code Status, Allergies Code Status Info: full ?Allergies Info: norco (hydrocodone acetaminophen) ?  ?  ?  ?   ? ?Current Medications (09/18/2021):  This is the current hospital active medication list ?Current Facility-Administered Medications  ?Medication Dose Route Frequency Provider Last Rate Last Admin  ? 0.9 %  sodium chloride infusion (Manually program via Guardrails IV Fluids)   Intravenous Once Enzo Bi, MD      ? alum & mag hydroxide-simeth (MAALOX/MYLANTA) 200-200-20 MG/5ML suspension 30 mL  30 mL Oral Q4H PRN Hessie Knows, MD      ? aspirin EC tablet 81 mg  81 mg Oral Daily Hessie Knows, MD   81 mg at 09/18/21 3016  ? atorvastatin (LIPITOR) tablet 80 mg  80 mg Oral QHS Hessie Knows, MD   80 mg at 09/17/21 2216  ? azelastine (ASTELIN) 0.1 % nasal spray 1 spray  1 spray Each Nare BID PRN Hessie Knows, MD      ? bisacodyl (DULCOLAX) suppository 10 mg  10 mg Rectal Daily PRN Hessie Knows, MD   10 mg at 09/16/21 1725  ? carvedilol (COREG) tablet 6.25 mg  6.25 mg Oral BID WC Enzo Bi, MD   6.25 mg at 09/17/21 1752  ? Chlorhexidine Gluconate Cloth 2 % PADS 6 each  6 each Topical Daily Enzo Bi, MD   6 each at 09/17/21 1017  ? citalopram (CELEXA) tablet 20 mg  20 mg Oral Daily Hessie Knows, MD   20 mg at  09/18/21 2229  ? docusate sodium (COLACE) capsule 100 mg  100 mg Oral BID Hessie Knows, MD   100 mg at 09/18/21 7989  ? enoxaparin (LOVENOX) injection 30 mg  30 mg Subcutaneous Q24H Hessie Knows, MD   30 mg at 09/18/21 0902  ? ferrous [id number] C-folic acid (TRINSICON / FOLTRIN) capsule 1 capsule  1 capsule Oral BID Duanne Guess, PA-C   1 capsule at 09/18/21 3149  ? gabapentin (NEURONTIN) capsule 300 mg  300 mg Oral Daily Hessie Knows, MD   300 mg at 09/18/21 7026  ? insulin aspart (novoLOG) injection 0-15 Units  0-15 Units Subcutaneous TID WC Hessie Knows, MD   3 Units at 09/18/21 0902  ? insulin aspart (novoLOG) injection 0-5 Units  0-5 Units Subcutaneous QHS Hessie Knows, MD      ? ipratropium-albuterol (DUONEB) 0.5-2.5 (3) MG/3ML nebulizer solution 3 mL  3 mL Nebulization TID Enzo Bi, MD   3 mL at 09/18/21 0745  ? magnesium hydroxide (MILK OF MAGNESIA) suspension 30 mL  30 mL Oral Daily PRN Hessie Knows, MD      ? menthol-cetylpyridinium (CEPACOL) lozenge 3 mg  1 lozenge Oral PRN Hessie Knows, MD      ? Or  ? phenol (CHLORASEPTIC) mouth spray 1 spray  1 spray Mouth/Throat PRN Hessie Knows, MD      ? methocarbamol (ROBAXIN) tablet 500 mg  500 mg Oral Q6H PRN Hessie Knows, MD   500 mg at 09/16/21 0448  ? Or  ? methocarbamol (ROBAXIN) 500 mg in dextrose 5 % 50 mL IVPB  500 mg Intravenous Q6H PRN Hessie Knows, MD      ? metoCLOPramide (REGLAN) tablet 5-10 mg  5-10 mg Oral Q8H PRN Hessie Knows, MD      ? Or  ? metoCLOPramide (REGLAN) injection 5-10 mg  5-10 mg Intravenous Q8H PRN Hessie Knows, MD      ? mometasone-formoterol [facility]) 200-5 MCG/ACT inhaler 2 puff  2 puff Inhalation BID Hessie Knows, MD   2 puff at 09/18/21 0901  ? ondansetron (ZOFRAN) tablet 4 mg  4 mg Oral Q6H PRN Hessie Knows, MD      ? Or  ? ondansetron (ZOFRAN) injection 4 mg  4 mg Intravenous Q6H PRN Hessie Knows, MD      ? oxyCODONE-acetaminophen (PERCOCET/ROXICET) 5-325 MG per tablet 1 tablet  1 tablet Oral Q6H  PRN Enzo Bi, MD      ? pantoprazole (PROTONIX) EC tablet 40 mg  40 mg Oral Daily Hessie Knows, MD   40 mg at 09/18/21 3785  ? tamsulosin (FLOMAX) capsule 0.8 mg  0.8 mg Oral QHS Hessie Knows, MD   0.8 mg at 09/17/21 2216  ? traZODone (DESYREL) tablet 50 mg  50 mg Oral QHS Hessie Knows, MD   50 mg at 09/17/21 2216  ? zolpidem (AMBIEN) tablet 5 mg  5 mg Oral QHS PRN Hessie Knows, MD      ? ? ? ?Discharge Medications: ?Please see discharge summary for a list of discharge medications. ? ?Relevant Imaging Results: ? ?Relevant Lab Results: ? ? ?Additional Information ?SS #: [phone] ? ?Arshawn Valdez E Lauren Modisette, LCSW ? ? ? ? ?

## 2021-09-18 NOTE — Progress Notes (Addendum)
1240 ?VSS. Consent signed and pt educated on blood transfusion reaction.  ?1 Unit of PRBC infusing now at 76m/hr. Nurse will remain in room with pt for first 165ms to ensure no transfusion reaction.  ? ?1250 ?Left VM with wife per pt request. Left call back number. Awaiting return call ? ?[id number]. Pt states he feels "ok" denies SOB or chest pain. Rate increased to 12570mr ? ?1558 ?Transfusion completed. VSS. No complications. Call bell and possessions within reach  ? ?1649 ?X2 MAX assist for pt to transfer from bed to chair. Wife and caregiver at bedside. Pt more alert x3. Glasses and clothing at bedside. Possessions and call bell within reach  ?

## 2021-09-18 NOTE — Progress Notes (Signed)
PT Cancellation Note ? ?Patient Details ?Name: Phillip Reyes ?MRN: [id number] ?DOB: Sep 28, 1940 ? ? ?Cancelled Treatment:    Reason Eval/Treat Not Completed: Medical issues which prohibited therapy ? ?Pt in bed, awake reports he is ready for therapy.  HgB noted 7.0 this am and BP 125/32 at 8:33 this am.  Initiated ankle pumps while checking O2 sats as he did seem to be a bit labored and congested.  Sats 81 L hand and 85 R hand.  RN called and was on her way into room for meds.  Assisted with donning O2 and session held at this time to address medical concerns.  ? ? ?Chesley Noon ?09/18/2021, 8:54 AM ?

## 2021-09-18 NOTE — Assessment & Plan Note (Addendum)
--  likely due to blood loss from surgery ?--s/p 1u pRBC for Hgb 7.0  ?

## 2021-09-18 NOTE — Progress Notes (Signed)
?  Progress Note ? ? ?Patient: Phillip Reyes EGB:[id number] DOB: 09-26-1940 DOA: 09/14/2021     4 ?DOS: the patient was seen and examined on 09/18/2021 ?  ?Brief hospital course: ?No notes on file ? ?Assessment and Plan: ?* Other fracture of right femur, initial encounter for closed fracture ([place]) ?S/p INTRAMEDULLARY (IM) NAIL on 5/11 ?--from accidental fall.  xray showed Comminuted displaced intertrochanteric fracture is seen in the ?proximal right femur. ?--Ortho consulted, with Dr. Rudene Christians ?Plan: ?--PT/OT ?--SNF rehab ? ?Acute on chronic anemia ?--likely due to blood loss from surgery ?--1u pRBC for Hgb 7.0 today ? ?Delirium ?--limit opioids use ? ?Dysphagia ?--SLP eval, rec nectar-thick liquid ? ?Hyperkalemia ?--may be due to CKD4 ?--Kayexalate 30 g x2 ? ?CKD (chronic kidney disease) stage 4, GFR 15-29 ml/min (HCC) ?--Cr trending up ? ?Chronic systolic CHF (congestive heart failure) ([place]) ?--appeared compensated.  Last LVEF 40 to 45% in Feb 2023. ? ? ?AKI (acute kidney injury) ([place]) ?--Cr went from 3.18 to 3.99 to 4.31  ?--s/p LR'@50'$  for 20 hours ?Plan: ?--encourage oral hydration ?--avoid more IVF since pt has sCHF ? ?H/O: stroke ?--Hold home ASA  ?--resume home statin after discharge ? ?COPD (chronic obstructive pulmonary disease) ([place]) ?--stable. ?--cont home Symbicort as Dulera ?--cont DuoNeb, flutter valve and chest PT  ? ?HTN (hypertension) ?--hold home amlodipine since BP wnl ? ?Type 2 diabetes mellitus ([place]) ?--recent A1c 6.7, well controlled ?--hold home long-acting insulin for now ?--SSI for now ? ? ? ? ?  ? ?Subjective:  ?Pt was much more awake this morning, after holding opioids.  Pt reported feeling better, less pain. ? ?Hgb 7, ordered blood transfusion. ? ? ?Physical Exam: ? ?Constitutional: NAD, AAOx3 ?HEENT: conjunctivae and lids normal, EOMI ?CV: No cyanosis.   ?RESP: normal respiratory effort, on RA ?SKIN: warm, dry ?Neuro: II - XII grossly intact.   ?Psych: Normal mood and affect.   ? ? ?Data  Reviewed: ? ?Family Communication:  ? ?Disposition: ?Status is: Inpatient ? ? Planned Discharge Destination: Rehab ? ? ? ?Time spent: 35 minutes ? ?Author: ?Enzo Bi, MD ?09/18/2021 4:01 PM ? ?For on call review [email or web address].  ?

## 2021-09-18 NOTE — Progress Notes (Signed)
Subjective: ?3 Days Post-Op Procedure(s) (LRB): ?INTRAMEDULLARY (IM) NAIL INTERTROCHANTRIC (Right) ?Patient reports pain as moderate.   ?Patient seems more alert this morning.  Knows where he is, the date and that it is Mothers day. ?Denies any CP, SOB, ABD pain. ?We will continue therapy today.   Will need SNF upon discharge. ? ?Objective: ?Vital signs in last 24 hours: ?Temp:  [98.3 ?F (36.8 ?C)-99 ?F (37.2 ?C)] 98.9 ?F (37.2 ?C) (05/14 0623) ?Pulse Rate:  [61-89] 89 (05/14 0833) ?Resp:  [16-18] 18 (05/14 0421) ?BP: (110-125)/(32-50) 125/32 (05/14 7628) ?SpO2:  [91 %-99 %] 99 % (05/14 0833) ? ?Intake/Output from previous day: ?05/13 0701 - 05/14 0700 ?In: 73 [P.O.:590] ?Out: 500 [Urine:500] ?Intake/Output this shift: ?No intake/output data recorded. ? ?Recent Labs  ?  09/16/21 ?3151 09/17/21 ?7616 09/18/21 ?0501  ?HGB 8.5* 8.2* 7.0*  ? ?Recent Labs  ?  09/17/21 ?0737 09/18/21 ?0501  ?WBC 10.8* 9.3  ?RBC 2.63* 2.27*  ?HCT 24.8* 21.3*  ?PLT 146* 127*  ? ?Recent Labs  ?  09/17/21 ?1062 09/18/21 ?0501  ?NA 136 138  ?K 3.9 3.7  ?CL 106 102  ?CO2 24 22  ?BUN 47* 49*  ?CREATININE 3.99* 4.31*  ?GLUCOSE 194* 162*  ?CALCIUM 8.0* 7.9*  ? ?No results for input(s): LABPT, INR in the last 72 hours. ? ?EXAM ?General - Patient is Alert and Appropriate ?Extremity - ABD soft ?Neurovascular intact ?Dorsiflexion/Plantar flexion intact ?Incision: scant drainage ?No cellulitis present ?Dressing - Mild bloody drainage noted to the right proximal hip incision. ?No evidence of active bleeding to the right leg. ?Motor Function - intact, moving foot and toes well on exam.  ?ABdomen soft, negative Homans to bilateral lower extremities. ? ?Past Medical History:  ?Diagnosis Date  ? Arthritis   ? Cancer [facility])   ? COPD (chronic obstructive pulmonary disease) ([place])   ? Diabetes mellitus without complication ([place])   ? Hypertension   ? Renal disorder   ? Stroke [facility])   ? ? ?Assessment/Plan:   ?3 Days Post-Op Procedure(s) (LRB): ?INTRAMEDULLARY  (IM) NAIL INTERTROCHANTRIC (Right) ?Principal Problem: ?  Other fracture of right femur, initial encounter for closed fracture ([place]) ?Active Problems: ?  Type 2 diabetes mellitus ([place]) ?  HTN (hypertension) ?  COPD (chronic obstructive pulmonary disease) ([place]) ?  H/O: stroke ?  AKI (acute kidney injury) ([place]) ?  Chronic systolic CHF (congestive heart failure) ([place]) ?  CKD (chronic kidney disease) stage 4, GFR 15-29 ml/min (HCC) ?  Hyperkalemia ?  Dysphagia ?  Delirium ? ?Estimated body mass index is 22.96 kg/m? as calculated from the following: ?  Height as of this encounter: '5\' 10"'$  (1.778 m). ?  Weight as of this encounter: 72.6 kg. ?Advance diet ?Up with therapy ? ?Labs and vitals reviewed this morning.   ?Hg down to 7.0, no active bleeding to the right leg. ?BP 125/32, HR 89.  Will place order to recheck Hg this afternoon.  May need transfusion. ?Confusion has improved, continue to limit narcotics.   ?Patient has had a BM. ?Up with therapy today, plan for SNF upon discharge. ? ?DVT Prophylaxis - Lovenox, TED hose, and SCDs ?Weight-Bearing as tolerated to right leg ? ?Raquel Jennessa Trigo, PA-C ?[place] ?09/18/2021, 8:33 AM ? ? ?

## 2021-09-18 NOTE — TOC CM/SW Note (Signed)
CSW attempted call to patient's spouse again, left another VM requesting return call. ?CSW attempted call to caregiver listed in chart, left a VM. ?CSW attempted call to daughter listed in chart, unable to leave a VM. ?CSW is starting SNF work up to avoid further delay in DC, if patient and family decide they do not want SNF, HH can be arranged instead.  ? Oleh Genin, LCSW ?[phone] ? ?

## 2021-09-19 DIAGNOSIS — S728X1A Other fracture of right femur, initial encounter for closed fracture: Secondary | ICD-10-CM | POA: Diagnosis not present

## 2021-09-19 LAB — CBC
HCT: 23.3 % — ABNORMAL LOW (ref 39.0–52.0)
Hemoglobin: 7.5 g/dL — ABNORMAL LOW (ref 13.0–17.0)
MCH: 30 pg (ref 26.0–34.0)
MCHC: 32.2 g/dL (ref 30.0–36.0)
MCV: 93.2 fL (ref 80.0–100.0)
Platelets: 155 10*3/uL (ref 150–400)
RBC: 2.5 MIL/uL — ABNORMAL LOW (ref 4.22–5.81)
RDW: 13.2 % (ref 11.5–15.5)
WBC: 8.7 10*3/uL (ref 4.0–10.5)
nRBC: 0 % (ref 0.0–0.2)

## 2021-09-19 LAB — MAGNESIUM: Magnesium: 2.2 mg/dL (ref 1.7–2.4)

## 2021-09-19 LAB — FOLATE: Folate: 37 ng/mL (ref >5.9)

## 2021-09-19 LAB — IRON AND TIBC
Iron: 22 ug/dL — ABNORMAL LOW (ref 45–182)
Saturation Ratios: 14 % — ABNORMAL LOW (ref 17.9–39.5)
TIBC: 158 ug/dL — ABNORMAL LOW (ref 250–450)
UIBC: 136 ug/dL

## 2021-09-19 LAB — BPAM RBC
Blood Product Expiration Date: 202306082359
ISSUE DATE / TIME: 202305141225
Unit Type and Rh: 5100

## 2021-09-19 LAB — BASIC METABOLIC PANEL
Anion gap: 7 (ref 5–15)
BUN: 49 mg/dL — ABNORMAL HIGH (ref 8–23)
CO2: 24 mmol/L (ref 22–32)
Calcium: 7.8 mg/dL — ABNORMAL LOW (ref 8.9–10.3)
Chloride: 106 mmol/L (ref 98–111)
Creatinine, Ser: 4.37 mg/dL — ABNORMAL HIGH (ref 0.61–1.24)
GFR, Estimated: 13 mL/min — ABNORMAL LOW (ref >60)
Glucose, Bld: 119 mg/dL — ABNORMAL HIGH (ref 70–99)
Potassium: 3.4 mmol/L — ABNORMAL LOW (ref 3.5–5.1)
Sodium: 137 mmol/L (ref 135–145)

## 2021-09-19 LAB — VITAMIN B12: Vitamin B-12: 634 pg/mL (ref 180–914)

## 2021-09-19 LAB — TYPE AND SCREEN
ABO/RH(D): O POS
Antibody Screen: NEGATIVE
Unit division: 0

## 2021-09-19 LAB — GLUCOSE, CAPILLARY
Glucose-Capillary: 132 mg/dL — ABNORMAL HIGH (ref 70–99)
Glucose-Capillary: 165 mg/dL — ABNORMAL HIGH (ref 70–99)
Glucose-Capillary: 202 mg/dL — ABNORMAL HIGH (ref 70–99)
Glucose-Capillary: 137 mg/dL — ABNORMAL HIGH (ref 70–99)

## 2021-09-19 MED ORDER — HYDROCODONE-ACETAMINOPHEN 5-325 MG PO TABS
1.0000 | ORAL_TABLET | Freq: Four times a day (QID) | ORAL | Status: DC | PRN
  Administered 2021-09-21: 1 via ORAL
  Filled 2021-09-19 (×2): qty 1

## 2021-09-19 MED ORDER — LACTATED RINGERS IV SOLN
INTRAVENOUS | Status: AC
Start: 2021-09-19 — End: 2021-09-20

## 2021-09-19 MED ORDER — ACETAMINOPHEN 500 MG PO TABS: 1000.0000 mg | ORAL_TABLET | Freq: Three times a day (TID) | ORAL | 0 refills | Status: AC | PRN

## 2021-09-19 MED ORDER — ENOXAPARIN SODIUM 40 MG/0.4ML IJ SOSY: 40.0000 mg | PREFILLED_SYRINGE | INTRAMUSCULAR | 0 refills | Status: DC

## 2021-09-19 MED ORDER — IPRATROPIUM-ALBUTEROL 0.5-2.5 (3) MG/3ML IN SOLN: 3.0000 mL | Freq: Four times a day (QID) | RESPIRATORY_TRACT | Status: DC | PRN

## 2021-09-19 NOTE — Progress Notes (Signed)
Patient refuse bed alarm. ?

## 2021-09-19 NOTE — Progress Notes (Signed)
? ?  Subjective: ?4 Days Post-Op Procedure(s) (LRB): ?INTRAMEDULLARY (IM) NAIL INTERTROCHANTRIC (Right) ?Patient reports pain as mild.   ?Patient is well, and has had no acute complaints or problems ?Denies any CP, SOB, ABD pain. ?We will continue therapy today.  ? ?Objective: ?Vital signs in last 24 hours: ?Temp:  [98 ?F (36.7 ?C)-100 ?F (37.8 ?C)] 98 ?F (36.7 ?C) (05/15 0511) ?Pulse Rate:  [60-89] 60 (05/15 0511) ?Resp:  [15-18] 17 (05/15 0511) ?BP: (105-135)/(32-101) 118/48 (05/15 0511) ?SpO2:  [94 %-100 %] 94 % (05/15 0511) ? ?Intake/Output from previous day: ?05/14 0701 - 05/15 0700 ?In: 487.9 [P.O.:100; Blood:387.9] ?Out: 250 [Urine:250] ?Intake/Output this shift: ?No intake/output data recorded. ? ?Recent Labs  ?  09/17/21 ?8453 09/18/21 ?0501 09/18/21 ?1715 09/19/21 ?0445  ?HGB 8.2* 7.0* 8.3* 7.5*  ? ?Recent Labs  ?  09/18/21 ?0501 09/18/21 ?1715 09/19/21 ?0445  ?WBC 9.3  --  8.7  ?RBC 2.27*  --  2.50*  ?HCT 21.3* 25.2* 23.3*  ?PLT 127*  --  155  ? ?Recent Labs  ?  09/18/21 ?0501 09/19/21 ?0445  ?NA 138 137  ?K 3.7 3.4*  ?CL 102 106  ?CO2 22 24  ?BUN 49* 49*  ?CREATININE 4.31* 4.37*  ?GLUCOSE 162* 119*  ?CALCIUM 7.9* 7.8*  ? ?No results for input(s): LABPT, INR in the last 72 hours. ? ?EXAM ?General - Patient is Alert, Appropriate, and Oriented ?Extremity - Neurovascular intact ?Sensation intact distally ?Intact pulses distally ?Dressing - no drainage, CDI ?Motor Function - intact, moving foot and toes well on exam.  ? ?Past Medical History:  ?Diagnosis Date  ? Arthritis   ? Cancer [facility])   ? COPD (chronic obstructive pulmonary disease) ([place])   ? Diabetes mellitus without complication ([place])   ? Hypertension   ? Renal disorder   ? Stroke [facility])   ? ? ?Assessment/Plan:   ?4 Days Post-Op Procedure(s) (LRB): ?INTRAMEDULLARY (IM) NAIL INTERTROCHANTRIC (Right) ?Principal Problem: ?  Other fracture of right femur, initial encounter for closed fracture ([place]) ?Active Problems: ?  Type 2 diabetes mellitus ([place]) ?  HTN  (hypertension) ?  COPD (chronic obstructive pulmonary disease) ([place]) ?  H/O: stroke ?  AKI (acute kidney injury) ([place]) ?  Chronic systolic CHF (congestive heart failure) ([place]) ?  CKD (chronic kidney disease) stage 4, GFR 15-29 ml/min (HCC) ?  Hyperkalemia ?  Dysphagia ?  Delirium ?  Acute on chronic anemia ? ?Estimated body mass index is 22.96 kg/m? as calculated from the following: ?  Height as of this encounter: '5\' 10"'$  (1.778 m). ?  Weight as of this encounter: 72.6 kg. ?Advance diet ?Up with therapy ?Work on [employer] ?VSS ?Acute post op blood loss anemia - Hgb 7.5, s/p 1 unit PRBC. Continue with Fe supplement. Recheck Hgb in the am ? ?CM to assist with discharge to SNF ? ?Follow up with [place] ortho in 2 weeks ?TED hose BLE x 6 weeks ?Lovenox 40 mg SQ daily X 14 days ? ?DVT Prophylaxis - Lovenox, TED hose, and SCDs ?Weight-Bearing as tolerated to right leg ? ? ?T. Rachelle Hora, PA-C ?[place] ?09/19/2021, 7:56 AM ? ? ?

## 2021-09-19 NOTE — Progress Notes (Addendum)
Speech Language Pathology Treatment:    ?Patient Details ?Name: Phillip Reyes ?MRN: [id number] ?DOB: 08/23/1940 ?Today's Date: 09/19/2021 ?Time: 1638-4536 ?SLP Time Calculation (min) (ACUTE ONLY): 10 min ? ?Assessment / Plan / Recommendation ?Clinical Impression ? Pt seen for diet tolerance and trials of upgraded textures. Pt alert, cooperative, pleasantly confused. Pt with c/o pain in R hip; 8/10 FACES; RN made aware. Pt last seen 09/16/21 with recommendation for a regular consistency diet with nectar-thick liquids given pt's subjective report of "better control" when consuming the nectar as it didn't "rush to back of my throat." Pt without overt s/sx pharyngeal dysphagia with regular solid, pureed, or thin liquids during time of initial SLP evaluation. ? ?Pt observed with items from regular consistency, nectar-thick liquids meal tray as well as trials of thin liquids via straw sips (sequential; 4 oz water challenge). Items from meal tray included scrambled eggs, blueberry muffin, nectar-thick milk from carton. Pt benefited from repositioning assistance and set up assistance to enable pt to self feed. Pt demonstrated a grossly intact oral swallow with mildly prolonged, but functional, mastication of solids. Pharyngeal swallow appeared [facility] per clinical re-assessment. No overt or subtle s/sx pharyngeal dysphagia. To palpation, pt with seemingly timely swallow initiation and seemingly adequate laryngeal elevation. No change to vocal quality or respiratory status across trials. Pt stating, "I'm doing better" (with thin liquids). ? ?Recommend a regular diet with thin liquids with safe swallowing strategies/aspiration precautions as outlined below.  ? ?SLP to sign off as pt has no acute SLP needs at this time.  ? ?Pt and RN made aware of results, recommendations, and SLP POC. Pt verbalized understanding/agreement. Reinforcement of content may be needed given pt's confusion. ?  ?HPI HPI: Pt is 81 y.o. male who was admitted  to [facility] after a fall, s/p R hip IM nailing. PMHx includes: Acute kidney disease, Anemia, HTN, CVA, PVD, and Dyslipidemia. Has had some hospitalizations recently. Pt with history of dysphagia and benefited temporarily from nectar thick liquids during previous hospitalization on 06/2021; pt upgraded to pureed diet with thin liquids prior to d/c. ?  ?   ?SLP Plan ? All goals met ? ?  ?  ?Recommendations for follow up therapy are one component of a multi-disciplinary discharge planning process, led by the attending physician.  Recommendations may be updated based on patient status, additional functional criteria and insurance authorization. ?  ? ?Recommendations  ?Diet recommendations: Regular;Thin liquid ?Medication Administration: Whole meds with liquid ?Supervision:  (set up assistance for opening containers and repositioning) ?Compensations: Minimize environmental distractions;Slow rate;Small sips/bites ?Postural Changes and/or Swallow Maneuvers: Out of bed for meals;Seated upright 90 degrees  ?   ?    ?   ? ? ? ? Oral Care Recommendations: Oral care BID (set up assistance) ?Follow Up Recommendations: Follow physician's recommendations for discharge plan and follow up therapies ?Assistance recommended at discharge:  (defer to OT/PT) ?SLP Visit Diagnosis: Dysphagia, oropharyngeal phase (R13.12) ?Plan: All goals met ? ? ? ? ?  ?  ? ?Phillip Reyes, M.S., CCC-SLP ?Speech-Language Pathologist ?[facility] ?[phone] ([place])  ? ?Phillip Reyes ? ?09/19/2021, 11:28 AM ?

## 2021-09-19 NOTE — Progress Notes (Signed)
Occupational Therapy Treatment ?Patient Details ?Name: Phillip Reyes ?MRN: [id number] ?DOB: 01-15-1941 ?Today's Date: 09/19/2021 ? ? ?History of present illness Pt is 81 y.o. male who was admitted to [facility] after a fall, s/p R hip IM nailing. PMHx includes: Acute kidney disease, Anemia, HTN, CVA, PVD, and Dyslipidemia. Has had some hospitalizations recently. ?  ?OT comments ? Pt seen for OT tx and co-tx with PT to address ADL transfers. Pt initially sleeping, wakes to verbal cues. Pt required heavy +2 assist for bed mobility and ADL transfers. Pt completed a few STS transfers from EOB each time requiring VC for hand placement, feet placement, RW mgt. Pt found to be in linens saturated with urine upon therapists' arrival. Once in sitting, pt required MAX A for UB/LB bathing (was able to wash the tops of his thighs himself). MAX A for pericare in standing with +2 assist. Full linens change. Pt able to take a couple shuffled side steps along the EOB with great effort and heavy +2 assist. Difficulty bearing weight through RLE 2/2 pain. Once returned to bed, HOB elevated significantly for self feeding of lunch. VC for strategies to minimize aspiration risk and food cut per pt's request. Noted minimal coughing while eating, which pt again reports is baseline for him. Pt continues to benefit from skilled OT services. Continues to be most appropriate for SNF.   ? ?Recommendations for follow up therapy are one component of a multi-disciplinary discharge planning process, led by the attending physician.  Recommendations may be updated based on patient status, additional functional criteria and insurance authorization. ?   ?Follow Up Recommendations ? Skilled nursing-short term rehab (<3 hours/day)  ?  ?Assistance Recommended at Discharge Frequent or constant Supervision/Assistance  ?Patient can return home with the following ? A lot of help with bathing/dressing/bathroom;Assistance with cooking/housework;Assist for  transportation;Direct supervision/assist for medications management;Help with stairs or ramp for entrance;Assistance with feeding;Two people to help with walking and/or transfers ?  ?Equipment Recommendations ? BSC/3in1;Other (comment) (2WW)  ?  ?Recommendations for Other Services   ? ?  ?Precautions / Restrictions Precautions ?Precautions: Fall ?Restrictions ?Weight Bearing Restrictions: Yes ?RLE Weight Bearing: Weight bearing as tolerated  ? ? ?  ? ?Mobility Bed Mobility ?Overal bed mobility: Needs Assistance ?Bed Mobility: Supine to Sit, Sit to Supine ?  ?  ?Supine to sit: Mod assist, +2 for physical assistance, HOB elevated ?Sit to supine: Max assist, +2 for physical assistance ?  ?General bed mobility comments: VC for hand placement/sequencing ?  ? ?Transfers ?Overall transfer level: Needs assistance ?Equipment used: Rolling walker (2 wheels) ?Transfers: Sit to/from Stand ?Sit to Stand: Mod assist, From elevated surface, +2 physical assistance ?  ?  ?  ?  ?  ?General transfer comment: Repeated STS transfers with VC for hand placement/RW mgt and feet positioning to improve technique/safety/balance ?  ?  ?Balance Overall balance assessment: Needs assistance ?Sitting-balance support: Feet supported, Single extremity supported, Bilateral upper extremity supported ?Sitting balance-Leahy Scale: Poor ?Sitting balance - Comments: VC + intermittent assist for sitting balance 2/2 posterior lean ?Postural control: Posterior lean ?Standing balance support: Reliant on assistive device for balance, Bilateral upper extremity supported ?Standing balance-Leahy Scale: Poor ?  ?  ?  ?  ?  ?  ?  ?  ?  ?  ?  ?  ?   ? ?ADL either performed or assessed with clinical judgement  ? ?ADL   ?  ?  ?  ?  ?  ?  ?  ?  ?  ?  ?  ?  ?  ?  ?  ?  ?  ?  ?  ?  General ADL Comments: Pt required MAX A for UB bathing and MOD-MAX A for LB bathing from seated position after found in bed saturated with urine. MOD-MAX A +2 for STS transfers with VC for RW  mgt. ?  ? ?Extremity/Trunk Assessment   ?  ?  ?  ?  ?  ? ?Vision   ?  ?  ?Perception   ?  ?Praxis   ?  ? ?Cognition Arousal/Alertness: Awake/alert ?Behavior During Therapy: [facility] for tasks assessed/performed ?Overall Cognitive Status: No family/caregiver present to determine baseline cognitive functioning ?  ?  ?  ?  ?  ?  ?  ?  ?  ?  ?  ?  ?  ?  ?  ?  ?General Comments: Alert once woken up, follows simple commands with VC, poor insight ?  ?  ?   ?Exercises   ? ?  ?Shoulder Instructions   ? ? ?  ?General Comments    ? ? ?Pertinent Vitals/ Pain       Pain Assessment ?Pain Assessment: 0-10 ?Pain Score: 10-Worst pain ever ?Pain Location: 10/10 with standing/side steps; 7/10 at rest ?Pain Descriptors / Indicators: Grimacing, Sore, Aching, Pressure, Guarding ?Pain Intervention(s): Limited activity within patient's tolerance, Monitored during session, Premedicated before session, Repositioned, Ice applied ? ?Home Living   ?  ?  ?  ?  ?  ?  ?  ?  ?  ?  ?  ?  ?  ?  ?  ?  ?  ?  ? ?  ?Prior Functioning/Environment    ?  ?  ?  ?   ? ?Frequency ? Min 2X/week  ? ? ? ? ?  ?Progress Toward Goals ? ?OT Goals(current goals can now be found in the care plan section) ? Progress towards OT goals: Progressing toward goals ? ?Acute Rehab OT Goals ?Patient Stated Goal: gett better and go home to wife ?OT Goal Formulation: With patient ?Time For Goal Achievement: 09/30/21 ?Potential to Achieve Goals: Good  ?Plan Discharge plan remains appropriate;Frequency remains appropriate   ? ?Co-evaluation ? ? ? PT/OT/SLP Co-Evaluation/Treatment: Yes ?Reason for Co-Treatment: For patient/therapist safety;To address functional/ADL transfers ?PT goals addressed during session: Mobility/safety with mobility;Balance;Proper use of DME ?OT goals addressed during session: ADL's and self-care;Proper use of Adaptive equipment and DME ?  ? ?  ?AM-PAC OT "6 Clicks" Daily Activity     ?Outcome Measure ? ? Help from another person eating meals?: None ?Help from  another person taking care of personal grooming?: A Little ?Help from another person toileting, which includes using toliet, bedpan, or urinal?: Total ?Help from another person bathing (including washing, rinsing, drying)?: A Lot ?Help from another person to put on and taking off regular upper body clothing?: A Lot ?Help from another person to put on and taking off regular lower body clothing?: A Lot ?6 Click Score: 14 ? ?  ?End of Session Equipment Utilized During Treatment: Gait belt;Rolling walker (2 wheels) ? ?OT Visit Diagnosis: Other abnormalities of gait and mobility (R26.89);Muscle weakness (generalized) (M62.81);Pain ?Pain - Right/Left: Right ?Pain - part of body: Hip ?  ?Activity Tolerance Patient limited by pain ?  ?Patient Left in bed;with call bell/phone within reach;with bed alarm set ?  ?Nurse Communication   ?  ? ?   ? ?Time: 0865-7846 ?OT Time Calculation (min): 24 min ? ?Charges: OT General Charges ?$OT Visit: 1 Visit ?OT Treatments ?$Self Care/Home Management : 8-22 mins ? ?Ardeth Perfect., MPH, MS, OTR/L ?ascom  [phone] ?09/19/21, 2:39 PM ?

## 2021-09-19 NOTE — Progress Notes (Signed)
?  Progress Note ? ? ?Patient: Phillip Reyes KDT:[id number] DOB: 06/24/1940 DOA: 09/14/2021     5 ?DOS: the patient was seen and examined on 09/19/2021 ?  ?Brief hospital course: ?No notes on file ? ?Assessment and Plan: ?* Other fracture of right femur, initial encounter for closed fracture ([place]) ?S/p INTRAMEDULLARY (IM) NAIL on 5/11 ?--from accidental fall.  xray showed Comminuted displaced intertrochanteric fracture is seen in the ?proximal right femur. ?--Ortho consulted, with Dr. Rudene Christians ?Plan: ?--PT/OT ?--SNF rehab ?--Norco PRN for pain, caution over-sedation ? ?Acute on chronic anemia ?--likely due to blood loss from surgery ?--s/p 1u pRBC for Hgb 7.0  ? ?Delirium ?--limit opioids use ? ?Dysphagia ?--SLP eval, rec nectar-thick liquid ? ?Hyperkalemia ?--may be due to CKD4 ?--Kayexalate 30 g x2 ? ?CKD (chronic kidney disease) stage 4, GFR 15-29 ml/min (HCC) ?--Cr trending up ? ?Chronic systolic CHF (congestive heart failure) ([place]) ?--appeared compensated.  Last LVEF 40 to 45% in Feb 2023. ? ? ?AKI (acute kidney injury) ([place]) ?--Cr went from 3.18 to 3.99 to 4.31  ?--s/p LR'@50'$  for 20 hours on 5/13 ?Plan: ?--encourage oral hydration ?--repeat LR'@50'$  for 20 hours since pt does not have consistent oral hydration currently ? ?H/O: stroke ?--Hold home ASA  ?--resume home statin after discharge ? ?COPD (chronic obstructive pulmonary disease) ([place]) ?--stable. ?--cont home Symbicort as Dulera ?--cont DuoNeb, flutter valve and chest PT  ? ?HTN (hypertension) ?--hold home amlodipine since BP wnl ? ?Type 2 diabetes mellitus ([place]) ?--recent A1c 6.7, well controlled ?--hold home long-acting insulin for now ?--SSI for now ? ? ? ? ?  ? ?Subjective:  ?Pt complained of more pain in his leg today.  Cr has not improved today, but didn't worsen. ? ? ?Physical Exam: ? ?Constitutional: NAD, sleeping, but arousable ?HEENT: conjunctivae and lids normal, EOMI ?CV: No cyanosis.   ?RESP: normal respiratory effort ?SKIN: warm, dry ?Neuro: II - XII  grossly intact.   ?Psych: depressed mood and affect.   ? ? ?Data Reviewed: ? ?Family Communication:  ? ?Disposition: ?Status is: Inpatient ? ? Planned Discharge Destination: Rehab ? ? ? ?Time spent: 35 minutes ? ?Author: ?Enzo Bi, MD ?09/19/2021 4:28 PM ? ?For on call review [email or web address].  ?

## 2021-09-19 NOTE — Progress Notes (Signed)
Physical Therapy Treatment ?Patient Details ?Name: Phillip Reyes ?MRN: [id number] ?DOB: 02/21/1941 ?Today's Date: 09/19/2021 ? ? ?History of Present Illness Pt is 81 y.o. male who was admitted to [facility] after a fall, s/p R hip IM nailing. PMHx includes: Acute kidney disease, Anemia, HTN, CVA, PVD, and Dyslipidemia. Has had some hospitalizations recently. ? ?  ?PT Comments  ? ? Co-tx with OT for improved mobility and outcomes.   ?To EOB with mod a x 1 and much encouragement.  Generally steady in sitting but cues and occasional tactile support to sit upright.  He is inc of urine and in need of full gown and linen changes.  He is able to stand x 3 at bedside with heavy mod a x 2 and is encouraged to step to turn to chair but is unable.  He is able to sidestep with poor quality along EOB to allow for linen change and to get higher up in bed.  He does have increased pain with mobility but calms when in supine.  Assisted with lunch set up and OT remained in room to assist/monitor as needed. ?  ?Recommendations for follow up therapy are one component of a multi-disciplinary discharge planning process, led by the attending physician.  Recommendations may be updated based on patient status, additional functional criteria and insurance authorization. ? ?Follow Up Recommendations ? Skilled nursing-short term rehab (<3 hours/day) ?  ?  ?Assistance Recommended at Discharge Frequent or constant Supervision/Assistance  ?Patient can return home with the following Two people to help with walking and/or transfers;Two people to help with bathing/dressing/bathroom;Direct supervision/assist for medications management;Help with stairs or ramp for entrance;Assist for transportation;Assistance with cooking/housework ?  ?Equipment Recommendations ? Other (comment)  ?  ?Recommendations for Other Services   ? ? ?  ?Precautions / Restrictions Precautions ?Precautions: Fall ?Restrictions ?Weight Bearing Restrictions: Yes ?RLE Weight Bearing: Weight  bearing as tolerated  ?  ? ?Mobility ? Bed Mobility ?Overal bed mobility: Needs Assistance ?Bed Mobility: Supine to Sit, Sit to Supine ?  ?  ?Supine to sit: Mod assist, +2 for physical assistance, HOB elevated ?Sit to supine: Max assist, +2 for physical assistance ?  ?General bed mobility comments: VC for hand placement/sequencing ?  ? ?Transfers ?Overall transfer level: Needs assistance ?Equipment used: Rolling walker (2 wheels) ?Transfers: Sit to/from Stand ?Sit to Stand: Mod assist, From elevated surface, +2 physical assistance ?  ?  ?  ?  ?  ?General transfer comment: Repeated STS transfers with VC for hand placement/RW mgt and feet positioning to improve technique/safety/balance ?  ? ?Ambulation/Gait ?Ambulation/Gait assistance: Mod assist, +2 physical assistance ?Gait Distance (Feet): 2 Feet ?Assistive device: Rolling walker (2 wheels) ?Gait Pattern/deviations: Step-to pattern ?Gait velocity: decreased ?  ?  ?General Gait Details: very limited and poor sidesteps along bed 2-3 steps max before ? ? ?Stairs ?  ?  ?  ?  ?  ? ? ?Wheelchair Mobility ?  ? ?Modified Rankin (Stroke Patients Only) ?  ? ? ?  ?Balance Overall balance assessment: Needs assistance ?Sitting-balance support: Feet supported, Single extremity supported, Bilateral upper extremity supported ?Sitting balance-Leahy Scale: Poor ?Sitting balance - Comments: VC + intermittent assist for sitting balance 2/2 posterior lean ?Postural control: Posterior lean ?Standing balance support: Reliant on assistive device for balance, Bilateral upper extremity supported ?Standing balance-Leahy Scale: Poor ?Standing balance comment: high fall risk ?  ?  ?  ?  ?  ?  ?  ?  ?  ?  ?  ?  ? ?  ?  Cognition Arousal/Alertness: Awake/alert ?Behavior During Therapy: [facility] for tasks assessed/performed ?Overall Cognitive Status: Within Functional Limits for tasks assessed ?  ?  ?  ?  ?  ?  ?  ?  ?  ?  ?  ?  ?  ?  ?  ?  ?General Comments: Alert once woken up, follows simple commands  with VC, poor insight ?  ?  ? ?  ?Exercises   ? ?  ?General Comments   ?  ?  ? ?Pertinent Vitals/Pain Pain Assessment ?Pain Assessment: Faces ?Faces Pain Scale: Hurts whole lot ?Pain Location: 10/10 with standing/side steps; 7/10 at rest ?Pain Descriptors / Indicators: Grimacing, Sore, Aching, Pressure, Guarding ?Pain Intervention(s): Limited activity within patient's tolerance, Monitored during session, Premedicated before session, Repositioned, Ice applied  ? ? ?Home Living   ?  ?  ?  ?  ?  ?  ?  ?  ?  ?   ?  ?Prior Function    ?  ?  ?   ? ?PT Goals (current goals can now be found in the care plan section) Progress towards PT goals: Progressing toward goals ? ?  ?Frequency ? ? ? 7X/week ? ? ? ?  ?PT Plan Current plan remains appropriate  ? ? ?Co-evaluation PT/OT/SLP Co-Evaluation/Treatment: Yes ?Reason for Co-Treatment: For patient/therapist safety ?PT goals addressed during session: Mobility/safety with mobility;Balance;Proper use of DME ?OT goals addressed during session: ADL's and self-care;Proper use of Adaptive equipment and DME ?  ? ?  ?AM-PAC PT "6 Clicks" Mobility   ?Outcome Measure ? Help needed turning from your back to your side while in a flat bed without using bedrails?: A Lot ?Help needed moving from lying on your back to sitting on the side of a flat bed without using bedrails?: A Lot ?Help needed moving to and from a bed to a chair (including a wheelchair)?: Total ?Help needed standing up from a chair using your arms (e.g., wheelchair or bedside chair)?: A Lot ?Help needed to walk in hospital room?: Total ?Help needed climbing 3-5 steps with a railing? : Total ?6 Click Score: 9 ? ?  ?End of Session Equipment Utilized During Treatment: Gait belt ?Activity Tolerance: Patient limited by pain ?Patient left: with call bell/phone within reach;in chair;with nursing/sitter in room ?Nurse Communication: Mobility status ?PT Visit Diagnosis: Difficulty in walking, not elsewhere classified (R26.2);Other  abnormalities of gait and mobility (R26.89);Muscle weakness (generalized) (M62.81);Pain ?Pain - Right/Left: Right ?Pain - part of body: Hip ?  ? ? ?Time: 2952-8413 ?PT Time Calculation (min) (ACUTE ONLY): 19 min ? ?Charges:  $Therapeutic Activity: 8-22 mins          ?         Chesley Noon, PTA ?09/19/21, 3:52 PM ? ?

## 2021-09-20 ENCOUNTER — Inpatient Hospital Stay: Payer: No Typology Code available for payment source

## 2021-09-20 DIAGNOSIS — S728X1A Other fracture of right femur, initial encounter for closed fracture: Secondary | ICD-10-CM | POA: Diagnosis not present

## 2021-09-20 DIAGNOSIS — R509 Fever, unspecified: Secondary | ICD-10-CM

## 2021-09-20 LAB — GLUCOSE, CAPILLARY
Glucose-Capillary: 126 mg/dL — ABNORMAL HIGH (ref 70–99)
Glucose-Capillary: 146 mg/dL — ABNORMAL HIGH (ref 70–99)
Glucose-Capillary: 173 mg/dL — ABNORMAL HIGH (ref 70–99)
Glucose-Capillary: 79 mg/dL (ref 70–99)
Glucose-Capillary: 117 mg/dL — ABNORMAL HIGH (ref 70–99)

## 2021-09-20 LAB — CBC WITH DIFFERENTIAL/PLATELET
Abs Immature Granulocytes: 0.06 10*3/uL (ref 0.00–0.07)
Basophils Absolute: 0.1 10*3/uL (ref 0.0–0.1)
Basophils Relative: 1 %
Eosinophils Absolute: 0.4 10*3/uL (ref 0.0–0.5)
Eosinophils Relative: 4 %
HCT: 22.7 % — ABNORMAL LOW (ref 39.0–52.0)
Hemoglobin: 7.5 g/dL — ABNORMAL LOW (ref 13.0–17.0)
Immature Granulocytes: 1 %
Lymphocytes Relative: 9 %
Lymphs Abs: 0.8 10*3/uL (ref 0.7–4.0)
MCH: 30.6 pg (ref 26.0–34.0)
MCHC: 33 g/dL (ref 30.0–36.0)
MCV: 92.7 fL (ref 80.0–100.0)
Monocytes Absolute: 1 10*3/uL (ref 0.1–1.0)
Monocytes Relative: 12 %
Neutro Abs: 6.3 10*3/uL (ref 1.7–7.7)
Neutrophils Relative %: 73 %
Platelets: 173 10*3/uL (ref 150–400)
RBC: 2.45 MIL/uL — ABNORMAL LOW (ref 4.22–5.81)
RDW: 13.3 % (ref 11.5–15.5)
WBC: 8.6 10*3/uL (ref 4.0–10.5)
nRBC: 0 % (ref 0.0–0.2)

## 2021-09-20 LAB — BASIC METABOLIC PANEL
Anion gap: 8 (ref 5–15)
BUN: 52 mg/dL — ABNORMAL HIGH (ref 8–23)
CO2: 23 mmol/L (ref 22–32)
Calcium: 7.9 mg/dL — ABNORMAL LOW (ref 8.9–10.3)
Chloride: 106 mmol/L (ref 98–111)
Creatinine, Ser: 4.02 mg/dL — ABNORMAL HIGH (ref 0.61–1.24)
GFR, Estimated: 14 mL/min — ABNORMAL LOW (ref >60)
Glucose, Bld: 130 mg/dL — ABNORMAL HIGH (ref 70–99)
Potassium: 3.6 mmol/L (ref 3.5–5.1)
Sodium: 137 mmol/L (ref 135–145)

## 2021-09-20 LAB — RESP PANEL BY RT-PCR (FLU A&B, COVID) ARPGX2
Influenza A by PCR: NEGATIVE
Influenza B by PCR: NEGATIVE
SARS Coronavirus 2 by RT PCR: NEGATIVE

## 2021-09-20 LAB — MAGNESIUM: Magnesium: 2.2 mg/dL (ref 1.7–2.4)

## 2021-09-20 LAB — PROCALCITONIN: Procalcitonin: 0.25 ng/mL

## 2021-09-20 NOTE — Progress Notes (Signed)
Physical Therapy Treatment ?Patient Details ?Name: Phillip Reyes ?MRN: [id number] ?DOB: 1941/02/11 ?Today's Date: 09/20/2021 ? ? ?History of Present Illness Pt is 81 y.o. male who was admitted to [facility] after a fall, s/p R hip IM nailing. PMHx includes: Acute kidney disease, Anemia, HTN, CVA, PVD, and Dyslipidemia. Has had some hospitalizations recently. ? ?  ?PT Comments  ? ? Pt EOB finishing breakfast on first attempt.  Fatigued and needing to rest.  Assisted to supine with mod a x 1.  Returned about 45 minutes later.  Participated in exercises as described below.  Pt congested/tight sounding but sats mid to low 90's.  Pt continues to have difficulty with transfers and is unsafe despite +2 assist at this time to attempt to chair.  Sit to stand lift is obtained and used to transfer pt to recliner to advance mobility safely.  He tolerated lift well and was pleased with his ability to get up today.  Discussed with tech transfer back to bed when fatigued.   ?  ?Recommendations for follow up therapy are one component of a multi-disciplinary discharge planning process, led by the attending physician.  Recommendations may be updated based on patient status, additional functional criteria and insurance authorization. ? ?Follow Up Recommendations ? Skilled nursing-short term rehab (<3 hours/day) ?  ?  ?Assistance Recommended at Discharge Frequent or constant Supervision/Assistance  ?Patient can return home with the following Two people to help with walking and/or transfers;Two people to help with bathing/dressing/bathroom;Direct supervision/assist for medications management;Help with stairs or ramp for entrance;Assist for transportation;Assistance with cooking/housework ?  ?Equipment Recommendations ? Other (comment)  ?  ?Recommendations for Other Services   ? ? ?  ?Precautions / Restrictions Precautions ?Precautions: Fall ?Restrictions ?Weight Bearing Restrictions: Yes ?RLE Weight Bearing: Weight bearing as tolerated  ?   ? ?Mobility ? Bed Mobility ?Overal bed mobility: Needs Assistance ?Bed Mobility: Supine to Sit, Sit to Supine ?  ?  ?Supine to sit: Mod assist ?Sit to supine: Mod assist ?  ?General bed mobility comments: VC for hand placement/sequencing ?  ? ?Transfers ?Overall transfer level: Needs assistance ?  ?Transfers: Bed to chair/wheelchair/BSC ?  ?  ?  ?  ?  ?  ?  ?Transfer via Lift Equipment: Vertis Kelch ? ?Ambulation/Gait ?  ?  ?  ?  ?  ?  ?  ?  ? ? ?Stairs ?  ?  ?  ?  ?  ? ? ?Wheelchair Mobility ?  ? ?Modified Rankin (Stroke Patients Only) ?  ? ? ?  ?Balance Overall balance assessment: Needs assistance ?Sitting-balance support: Feet supported, Single extremity supported, Bilateral upper extremity supported ?Sitting balance-Leahy Scale: Fair ?Sitting balance - Comments: able to sit EOB for breakfast and then to apply [facility] lift. ?  ?  ?  ?  ?  ?  ?  ?  ?  ?  ?  ?  ?  ?  ?  ?  ? ?  ?Cognition Arousal/Alertness: Awake/alert ?Behavior During Therapy: [facility] for tasks assessed/performed ?Overall Cognitive Status: Within Functional Limits for tasks assessed ?  ?  ?  ?  ?  ?  ?  ?  ?  ?  ?  ?  ?  ?  ?  ?  ?  ?  ?  ? ?  ?Exercises Other Exercises ?Other Exercises: RLE AAROM x 20 for all ranges ? ?  ?General Comments   ?  ?  ? ?Pertinent Vitals/Pain Pain Assessment ?Pain Assessment: Faces ?Faces  Pain Scale: Hurts whole lot ?Pain Descriptors / Indicators: Grimacing, Sore, Aching, Pressure, Guarding ?Pain Intervention(s): Limited activity within patient's tolerance, Monitored during session, Premedicated before session, Repositioned  ? ? ?Home Living   ?  ?  ?  ?  ?  ?  ?  ?  ?  ?   ?  ?Prior Function    ?  ?  ?   ? ?PT Goals (current goals can now be found in the care plan section) Progress towards PT goals: Progressing toward goals ? ?  ?Frequency ? ? ? 7X/week ? ? ? ?  ?PT Plan Current plan remains appropriate  ? ? ?Co-evaluation   ?  ?  ?  ?  ? ?  ?AM-PAC PT "6 Clicks" Mobility   ?Outcome Measure ? Help needed turning from your  back to your side while in a flat bed without using bedrails?: A Lot ?Help needed moving from lying on your back to sitting on the side of a flat bed without using bedrails?: A Lot ?Help needed moving to and from a bed to a chair (including a wheelchair)?: Total ?Help needed standing up from a chair using your arms (e.g., wheelchair or bedside chair)?: A Lot ?Help needed to walk in hospital room?: Total ?Help needed climbing 3-5 steps with a railing? : Total ?6 Click Score: 9 ? ?  ?End of Session Equipment Utilized During Treatment: Gait belt;Other (comment) ?Activity Tolerance: Patient limited by pain ?Patient left: with call bell/phone within reach;in chair;with nursing/sitter in room ?Nurse Communication: Mobility status ?PT Visit Diagnosis: Difficulty in walking, not elsewhere classified (R26.2);Other abnormalities of gait and mobility (R26.89);Muscle weakness (generalized) (M62.81);Pain ?Pain - Right/Left: Right ?Pain - part of body: Hip ?  ? ? ?Time: 0034-9179 ?PT Time Calculation (min) (ACUTE ONLY): 26 min ? ?Charges:  $Therapeutic Exercise: 8-22 mins ?$Therapeutic Activity: 8-22 mins          ?         Chesley Noon, PTA ?09/20/21, 10:59 AM ? ?

## 2021-09-20 NOTE — Progress Notes (Signed)
OT Cancellation Note ? ?Patient Details ?Name: Sebastiano Luecke ?MRN: [id number] ?DOB: 04-22-1941 ? ? ?Cancelled Treatment:    Reason Eval/Treat Not Completed: Other (comment). Pt with nursing preparing to swab pt. Will re-attempt OT tx at later date/time as pt is available.  ? ?Ardeth Perfect., MPH, MS, OTR/L ?ascom [phone] ?09/20/21, 2:21 PM ? ?

## 2021-09-20 NOTE — Progress Notes (Signed)
? ?  Subjective: ?5 Days Post-Op Procedure(s) (LRB): ?INTRAMEDULLARY (IM) NAIL INTERTROCHANTRIC (Right) ?Patient reports pain as mild.   ?Patient is well, and has had no acute complaints or problems ?Denies any CP, SOB, ABD pain. ?We will continue therapy today.  ? ?Objective: ?Vital signs in last 24 hours: ?Temp:  [98.3 ?F (36.8 ?C)-100.9 ?F (38.3 ?C)] 98.4 ?F (36.9 ?C) (05/16 8657) ?Pulse Rate:  [60-70] 64 (05/16 0333) ?Resp:  [14-18] 14 (05/16 0333) ?BP: (109-131)/(46-58) 119/58 (05/16 0333) ?SpO2:  [95 %-97 %] 97 % (05/16 0649) ? ?Intake/Output from previous day: ?05/15 0701 - 05/16 0700 ?In: 865.2 [P.O.:240; I.V.:625.2] ?Out: 725 [Urine:725] ?Intake/Output this shift: ?No intake/output data recorded. ? ?Recent Labs  ?  09/18/21 ?0501 09/18/21 ?1715 09/19/21 ?0445 09/20/21 ?0304  ?HGB 7.0* 8.3* 7.5* 7.5*  ? ?Recent Labs  ?  09/19/21 ?0445 09/20/21 ?0304  ?WBC 8.7 8.6  ?RBC 2.50* 2.45*  ?HCT 23.3* 22.7*  ?PLT 155 173  ? ?Recent Labs  ?  09/19/21 ?0445 09/20/21 ?0304  ?NA 137 137  ?K 3.4* 3.6  ?CL 106 106  ?CO2 24 23  ?BUN 49* 52*  ?CREATININE 4.37* 4.02*  ?GLUCOSE 119* 130*  ?CALCIUM 7.8* 7.9*  ? ?No results for input(s): LABPT, INR in the last 72 hours. ? ?EXAM ?General - Patient is Alert, Appropriate, and Oriented ?Extremity - Neurovascular intact ?Sensation intact distally ?Intact pulses distally ?Dressing - no drainage, CDI ?Motor Function - intact, moving foot and toes well on exam.  ? ?Past Medical History:  ?Diagnosis Date  ? Arthritis   ? Cancer [facility])   ? COPD (chronic obstructive pulmonary disease) ([place])   ? Diabetes mellitus without complication ([place])   ? Hypertension   ? Renal disorder   ? Stroke [facility])   ? ? ?Assessment/Plan:   ?5 Days Post-Op Procedure(s) (LRB): ?INTRAMEDULLARY (IM) NAIL INTERTROCHANTRIC (Right) ?Principal Problem: ?  Other fracture of right femur, initial encounter for closed fracture ([place]) ?Active Problems: ?  Type 2 diabetes mellitus ([place]) ?  HTN (hypertension) ?  COPD (chronic  obstructive pulmonary disease) ([place]) ?  H/O: stroke ?  AKI (acute kidney injury) ([place]) ?  Chronic systolic CHF (congestive heart failure) ([place]) ?  CKD (chronic kidney disease) stage 4, GFR 15-29 ml/min (HCC) ?  Hyperkalemia ?  Dysphagia ?  Delirium ?  Acute on chronic anemia ? ?Estimated body mass index is 22.96 kg/m? as calculated from the following: ?  Height as of this encounter: '5\' 10"'$  (1.778 m). ?  Weight as of this encounter: 72.6 kg. ?Advance diet ?Up with therapy ?VSS ?Acute post op blood loss anemia - Hgb 7.5 stable, s/p 1 unit PRBC. Continue with Fe supplement.  ? ?CM to assist with discharge to SNF ? ?Follow up with [facility] in 2 weeks ?TED hose BLE x 6 weeks ?Lovenox 40 mg SQ daily X 14 days ? ?DVT Prophylaxis - Lovenox, TED hose, and SCDs ?Weight-Bearing as tolerated to right leg ? ? ?T. Rachelle Hora, PA-C ?[place] ?09/20/2021, 7:44 AM ? ? ?

## 2021-09-20 NOTE — Progress Notes (Signed)
?  Progress Note ? ? ?Patient: Phillip Reyes WYO:[id number] DOB: 11/10/1940 DOA: 09/14/2021     6 ?DOS: the patient was seen and examined on 09/20/2021 ?  ?Brief hospital course: ?No notes on file ? ?Assessment and Plan: ?* Other fracture of right femur, initial encounter for closed fracture ([place]) ?S/p INTRAMEDULLARY (IM) NAIL on 5/11 ?--from accidental fall.  xray showed Comminuted displaced intertrochanteric fracture is seen in the ?proximal right femur. ?--Ortho consulted, with Dr. Rudene Christians ?Plan: ?--PT/OT ?--SNF rehab ?--Norco PRN for pain, caution over-sedation ? ?Fever ?--1 episode of fever to 100.9 on 5/15. ?--covid/flu neg ?Plan: ?--CXR, blood cx ?--monitor without abx for now ? ?Acute on chronic anemia ?--likely due to blood loss from surgery ?--s/p 1u pRBC for Hgb 7.0  ? ?Delirium ?--limit opioids use ? ?Dysphagia ?--SLP eval, rec nectar-thick liquid during acute illness, now back on thin liquid. ? ?Hyperkalemia ?--may be due to CKD4 ?--Kayexalate 30 g x2 ? ?CKD (chronic kidney disease) stage 4, GFR 15-29 ml/min (HCC) ?--Cr trending up ? ?Chronic systolic CHF (congestive heart failure) ([place]) ?--appeared compensated.  Last LVEF 40 to 45% in Feb 2023. ? ? ?AKI (acute kidney injury) ([place]) ?--Cr went from 3.18 to 4.37 ?--s/p LR'@50'$  for 20 hours x2, Cr slowly trending down ?Plan: ?--encourage oral hydration  ? ?H/O: stroke ?--Hold home ASA  ?--resume home statin after discharge ? ?COPD (chronic obstructive pulmonary disease) ([place]) ?--stable. ?--cont home Symbicort as Dulera ?--cont DuoNeb, flutter valve and chest PT  ? ?HTN (hypertension) ?--hold home amlodipine since BP wnl ? ?Type 2 diabetes mellitus ([place]) ?--recent A1c 6.7, well controlled ?--hold home long-acting insulin for now ?--SSI for now ? ? ? ? ?  ? ?Subjective:  ?Pt had a fever to 100.9 yesterday.  Respiratory status remained stable.   ? ? ?Physical Exam: ? ?Constitutional: NAD, AAOx3 ?HEENT: conjunctivae and lids normal, EOMI ?CV: No cyanosis.   ?RESP:  normal respiratory effort, on RA ?Neuro: II - XII grossly intact.   ?Psych: Normal mood and affect.   ? ? ?Data Reviewed: ? ?Family Communication:  ? ?Disposition: ?Status is: Inpatient ? ? Planned Discharge Destination: Rehab ? ? ? ?Time spent: 35 minutes ? ?Author: ?Enzo Bi, MD ?09/20/2021 5:31 PM ? ?For on call review [email or web address].  ?

## 2021-09-20 NOTE — Plan of Care (Signed)
  Problem: Education: Goal: Knowledge of General Education information will improve Description Including pain rating scale, medication(s)/side effects and non-pharmacologic comfort measures Outcome: Progressing   Problem: Health Behavior/Discharge Planning: Goal: Ability to manage health-related needs will improve Outcome: Progressing   

## 2021-09-20 NOTE — TOC Progression Note (Signed)
Transition of Care (TOC) - Progression Note  ? ? ?Patient Details  ?Name: Phillip Reyes ?MRN: [id number] ?Date of Birth: 1940-05-10 ? ?Transition of Care (TOC) CM/SW Contact  ?Conception Oms, RN ?Phone Number: ?09/20/2021, 2:44 PM ? ?Clinical Narrative:    ? ?Called spouse Pamala Hurry at [phone] and  ? ? Enid Derry at [phone] ? ? And daughter [phone] ?As well as caregiver at [phone] ? ?No answer at any, left a general voice mail, awaiitng a call back ? ? ?Expected Discharge Plan and Services ?  ?  ?  ?  ?  ?                ?  ?  ?  ?  ?  ?  ?  ?  ?  ?  ? ? ?Social Determinants of Health (SDOH) Interventions ?  ? ?Readmission Risk Interventions ?   ? View : No data to display.  ?  ?  ?  ? ? ?

## 2021-09-20 NOTE — Plan of Care (Signed)

## 2021-09-20 NOTE — Assessment & Plan Note (Signed)
--  1 episode of fever to 100.9 on 5/15. ?--covid/flu neg ?Plan: ?--CXR, blood cx ?--monitor without abx for now ?

## 2021-09-21 DIAGNOSIS — S728X1A Other fracture of right femur, initial encounter for closed fracture: Secondary | ICD-10-CM | POA: Diagnosis not present

## 2021-09-21 LAB — GLUCOSE, CAPILLARY
Glucose-Capillary: 130 mg/dL — ABNORMAL HIGH (ref 70–99)
Glucose-Capillary: 171 mg/dL — ABNORMAL HIGH (ref 70–99)
Glucose-Capillary: 121 mg/dL — ABNORMAL HIGH (ref 70–99)
Glucose-Capillary: 229 mg/dL — ABNORMAL HIGH (ref 70–99)

## 2021-09-21 LAB — BASIC METABOLIC PANEL
Anion gap: 7 (ref 5–15)
BUN: 51 mg/dL — ABNORMAL HIGH (ref 8–23)
CO2: 25 mmol/L (ref 22–32)
Calcium: 8.2 mg/dL — ABNORMAL LOW (ref 8.9–10.3)
Chloride: 105 mmol/L (ref 98–111)
Creatinine, Ser: 3.83 mg/dL — ABNORMAL HIGH (ref 0.61–1.24)
GFR, Estimated: 15 mL/min — ABNORMAL LOW (ref >60)
Glucose, Bld: 117 mg/dL — ABNORMAL HIGH (ref 70–99)
Potassium: 3.8 mmol/L (ref 3.5–5.1)
Sodium: 137 mmol/L (ref 135–145)

## 2021-09-21 LAB — CBC
HCT: 24.2 % — ABNORMAL LOW (ref 39.0–52.0)
Hemoglobin: 8 g/dL — ABNORMAL LOW (ref 13.0–17.0)
MCH: 30.4 pg (ref 26.0–34.0)
MCHC: 33.1 g/dL (ref 30.0–36.0)
MCV: 92 fL (ref 80.0–100.0)
Platelets: 210 10*3/uL (ref 150–400)
RBC: 2.63 MIL/uL — ABNORMAL LOW (ref 4.22–5.81)
RDW: 13.4 % (ref 11.5–15.5)
WBC: 7.9 10*3/uL (ref 4.0–10.5)
nRBC: 0 % (ref 0.0–0.2)

## 2021-09-21 LAB — PROCALCITONIN: Procalcitonin: 0.17 ng/mL

## 2021-09-21 LAB — MAGNESIUM: Magnesium: 2.3 mg/dL (ref 1.7–2.4)

## 2021-09-21 MED ORDER — AMOXICILLIN-POT CLAVULANATE 500-125 MG PO TABS
1.0000 | ORAL_TABLET | Freq: Two times a day (BID) | ORAL | Status: DC
  Administered 2021-09-21 – 2021-09-23 (×5): 500 mg via ORAL
  Filled 2021-09-21 (×5): qty 1

## 2021-09-21 NOTE — TOC Progression Note (Signed)
Transition of Care (TOC) - Progression Note  ? ? ?Patient Details  ?Name: Essex Perry ?MRN: [id number] ?Date of Birth: 1940/05/11 ? ?Transition of Care (TOC) CM/SW Contact  ?Conception Oms, RN ?Phone Number: ?09/21/2021, 9:40 AM ? ?Clinical Narrative:    ?Reeived a call from Merrill the patient's caregiver, I explained I have a bed offer and need to know if they would like to accept it from [facility], [name] said yes, I explained I need Pamala Hurry the patient's spouse to confirm, Renaldo Harrison will call back once they get to the house where Pamala Hurry is to confirm ? ? ?Expected Discharge Plan: [place] ?Barriers to Discharge: Continued Medical Work up ? ?Expected Discharge Plan and Services ?Expected Discharge Plan: [place] ?  ?Discharge Planning Services: CM Consult ?  ?Living arrangements for the past 2 months: [place] ?                ?DME Arranged: N/A ?  ?  ?  ?  ?  ?  ?  ?  ?  ? ? ?Social Determinants of Health (SDOH) Interventions ?  ? ?Readmission Risk Interventions ?   ? View : No data to display.  ?  ?  ?  ? ? ?

## 2021-09-21 NOTE — Plan of Care (Signed)

## 2021-09-21 NOTE — Hospital Course (Signed)
81yo admitted with came with fall and hip fracture and is status post surgery, had many complications, with AKI on already w/ CKD4,has had worsening anemia, and then developed fever 09/20/21- cxr UA ordered. TOC working on SNF. ?

## 2021-09-21 NOTE — Progress Notes (Signed)
Physical Therapy Treatment ?Patient Details ?Name: Phillip Reyes ?MRN: [id number] ?DOB: 05-Feb-1941 ?Today's Date: 09/21/2021 ? ? ?History of Present Illness Pt is 81 y.o. male who was admitted to [facility] after a fall, s/p R hip IM nailing. PMHx includes: Acute kidney disease, Anemia, HTN, CVA, PVD, and Dyslipidemia. Has had some hospitalizations recently. ? ?  ?PT Comments  ? ? Pt was asleep in long sitting upon arriving. He easily awakes and is cooperative throughout. He is however disoriented. Lacks complete insight of current situation however was able to follow simple commands throughout. He required max assist for all moibility, transfers, and even to take a few steps to recliner with RW. Pt has knee buckling in wt bearing with progressing of BLEs. Some SOB noted with standing activity however quickly resolved with seated rest. Sao2 > 88% on rm air throughout. Highly recommend DC to SND to address deficits while maximizing independence with ADLs.  ?  ?Recommendations for follow up therapy are one component of a multi-disciplinary discharge planning process, led by the attending physician.  Recommendations may be updated based on patient status, additional functional criteria and insurance authorization. ? ?Follow Up Recommendations ? Skilled nursing-short term rehab (<3 hours/day) ?  ?  ?Assistance Recommended at Discharge Frequent or constant Supervision/Assistance  ?Patient can return home with the following Two people to help with walking and/or transfers;Two people to help with bathing/dressing/bathroom;Direct supervision/assist for medications management;Help with stairs or ramp for entrance;Assist for transportation;Assistance with cooking/housework ?  ?Equipment Recommendations ? Other (comment) (defer to next level of care)  ?  ?   ?Precautions / Restrictions Precautions ?Precautions: Fall ?Restrictions ?Weight Bearing Restrictions: Yes ?RLE Weight Bearing: Weight bearing as tolerated  ?  ? ?Mobility ? Bed  Mobility ?Overal bed mobility: Needs Assistance ?Bed Mobility: Supine to Sit, Sit to Supine ?  ?  ?Supine to sit: Mod assist, Max assist ?  ?  ?General bed mobility comments: Pt required mod-max of one to transition to EOB short sit form supine (HOB elevated ~ 30 degrees) ?  ? ?Transfers ?Overall transfer level: Needs assistance ?Equipment used: Rolling walker (2 wheels) ?Transfers: Sit to/from Stand, Bed to chair/wheelchair/BSC ?Sit to Stand: Max assist, From elevated surface ?  ?Step pivot transfers: Max assist, From elevated surface ?  ?  ?  ?General transfer comment: pt stood 3 x total with 3rd STS pt performed stand pivot to recline r ?  ? ?Ambulation/Gait ?Ambulation/Gait assistance: Max assist ?Gait Distance (Feet): 3 Feet ?Assistive device: Rolling walker (2 wheels) ?Gait Pattern/deviations: Step-to pattern ?Gait velocity: decreased ?  ?  ?General Gait Details: Pt was able to take a few steps to recliner. slight knee bucking throughout wt bearing activity ? ?  ?Balance Overall balance assessment: Needs assistance ?Sitting-balance support: Feet supported, Single extremity supported, Bilateral upper extremity supported ?Sitting balance-Leahy Scale: Fair ?  ?  ?Standing balance support: Reliant on assistive device for balance, Bilateral upper extremity supported ?Standing balance-Leahy Scale: Poor ?  ?   ?Cognition Arousal/Alertness: Awake/alert ?Behavior During Therapy: [facility] for tasks assessed/performed ?Overall Cognitive Status: Within Functional Limits for tasks assessed ?  ?   ?General Comments: Pt is alert however disoriented x 2. Does follow simple ommands consistently with increased time. ?  ?  ? ?  ?   ?   ? ?Pertinent Vitals/Pain Pain Assessment ?Pain Assessment: 0-10 ?Pain Score: 7  ?Faces Pain Scale: Hurts a little bit ?Pain Location: R LE with wt bearing ?Pain Descriptors / Indicators: Grimacing, Sore, Aching,  Pressure, Guarding ?Pain Intervention(s): Limited activity within patient's tolerance,  Monitored during session, Premedicated before session, Repositioned  ? ? ? ?PT Goals (current goals can now be found in the care plan section) Acute Rehab PT Goals ?Patient Stated Goal: have less pain ?Progress towards PT goals: Progressing toward goals ? ?  ?Frequency ? ? ? 7X/week ? ? ? ?  ?PT Plan Current plan remains appropriate  ? ? ?Co-evaluation   ?  ?PT goals addressed during session: Mobility/safety with mobility;Balance;Proper use of DME;Strengthening/ROM ?  ?  ? ?  ?AM-PAC PT "6 Clicks" Mobility   ?Outcome Measure ? Help needed turning from your back to your side while in a flat bed without using bedrails?: A Lot ?Help needed moving from lying on your back to sitting on the side of a flat bed without using bedrails?: A Lot ?Help needed moving to and from a bed to a chair (including a wheelchair)?: A Lot ?Help needed standing up from a chair using your arms (e.g., wheelchair or bedside chair)?: A Lot ?Help needed to walk in hospital room?: A Lot ?Help needed climbing 3-5 steps with a railing? : A Lot ?6 Click Score: 12 ? ?  ?End of Session   ?Activity Tolerance: Patient limited by pain;Patient limited by fatigue (Slight SOB with standing activity. sao2 >88% throughout) ?Patient left: in chair;with call bell/phone within reach;with chair alarm set ?Nurse Communication: Mobility status ?PT Visit Diagnosis: Difficulty in walking, not elsewhere classified (R26.2);Other abnormalities of gait and mobility (R26.89);Muscle weakness (generalized) (M62.81);Pain ?Pain - Right/Left: Right ?Pain - part of body: Hip ?  ? ? ?Time: 1572-6203 ?PT Time Calculation (min) (ACUTE ONLY): 23 min ? ?Charges:  $Therapeutic Activity: 23-37 mins          ?          ? ?Julaine Fusi PTA ?09/21/21, 9:03 AM  ? ?

## 2021-09-21 NOTE — Progress Notes (Signed)
?PROGRESS NOTE ?Phillip Reyes  EXH:[id number] DOB: Nov 10, 1940 DOA: 09/14/2021 ?PCP: [facility] [place]  ? ?Brief Narrative/Hospital Course: ?80yo admitted with came with fall and hip fracture and is status post surgery, had many complications, with AKI on already w/ CKD4,has had worsening anemia, and then developed fever 09/20/21- cxr UA ordered. TOC working on SNF.  ?  ?Subjective: ?Seen examined this morning.  He resting comfortably complains of some cough but chronic, no fever no chills.  On the bedside chair having his meal.  Nursing at the bedside. ?  ?Assessment and Plan: ?Principal Problem: ?  Other fracture of right femur, initial encounter for closed fracture ([place]) ?Active Problems: ?  Type 2 diabetes mellitus ([place]) ?  HTN (hypertension) ?  COPD (chronic obstructive pulmonary disease) ([place]) ?  H/O: stroke ?  AKI (acute kidney injury) ([place]) ?  Chronic systolic CHF (congestive heart failure) ([place]) ?  CKD (chronic kidney disease) stage 4, GFR 15-29 ml/min ([facility]) ?  Hyperkalemia ?  Dysphagia ?  Delirium ?  Acute on chronic anemia ?  Fever ?  ?Fracture right femur : S/p INTRAMEDULLARY (IM) NAIL on 5/11 ?Fracture due to accidental fall, appreciate orthopedic input, continue PT OT, pain control.  Awaiting for skilled nursing facility placement.  On Lovenox for DVT prophylaxis. ? ?Fever transient: Episode of fever 100.9 on 5/15 4 pm, No recurrence.  COVID flu negative, chest x-ray blood culture was sent 5/16-?  Mild Right upper lobe infiltrate/pneumonia.  No leukocytosis, check procalcitonin procalcitonin. Add augmentin ? ?Acute on chronic anemia: ?Acute blood loss from surgery: ?s/p 1u pRBC for Hgb 7.0. Monitor ?Recent Labs  ?Lab 09/18/21 ?0501 09/18/21 ?1715 09/19/21 ?0445 09/20/21 ?0304 09/21/21 ?3810  ?HGB 7.0* 8.3* 7.5* 7.5* 8.0*  ?HCT 21.3* 25.2* 23.3* 22.7* 24.2*  ?  ?Delirium: Multifactorial, limiting opioid use.  Mental status stable and improved ? ?Dysphagia: Appreciate SLP eval, initially on  nectar-thick liquid during acute illness, now back on thin liquid. ? ?Hyperkalemia: In the setting of CKD, Kayexalate x2.  Stable ?Recent Labs  ?Lab 09/17/21 ?1751 09/18/21 ?0501 09/19/21 ?0445 09/20/21 ?0304 09/21/21 ?0258  ?K 3.9 3.7 3.4* 3.6 3.8  ?  ?AKI on CKD ?CKD stage 4, GFR 15-29 ml/min: Creatinine improved and stable after IV fluids stable  in mid 3- 4s ?Recent Labs  ?Lab 09/17/21 ?5277 09/18/21 ?0501 09/19/21 ?0445 09/20/21 ?0304 09/21/21 ?8242  ?BUN 47* 49* 49* 52* 51*  ?CREATININE 3.99* 4.31* 4.37* 4.02* 3.83*  ?  ?Chronic systolic CHF: Compensated.Last LVEF 40 to 45% in Feb 2023. ?Net IO Since Admission: 530.82 mL [09/21/21 1133]  ? ?H/O: stroke: cont ASA.  Resume statin upon discharge. ? ?COPD:Respiratory status stable.  Continue home Symbicort as Dulera.  Bronchodilators, IS.FV ? ?HTN:BP well controlled . Amlodipine remains on HOLD. ? ?Type 2 diabetes mellitus:recent A1c 6.7, well controlled. Cont to hold home long-acting insulin and keep SSI for now ?Recent Labs  ?Lab 09/20/21 ?0808 09/20/21 ?1158 09/20/21 ?1639 09/20/21 ?2000 09/21/21 ?3536  ?GLUCAP 126* 173* 79 117* 130*  ?  ?DVT prophylaxis: enoxaparin (LOVENOX) injection 30 mg Start: 09/16/21 0800 ?SCDs Start: 09/15/21 1836 ?Place TED hose Start: 09/15/21 1836 ?SCDs Start: 09/14/21 1836 ?Code Status:   Code Status: Full Code ?Family Communication: plan of care discussed with patient at bedside. ?Patient status is: Inpatient because of ongoing management of male.  Pending placement ?Level of care: Med-Surg  ? ?Dispo: The patient is from: home ?           Anticipated  disposition: SNF in 24  hr if afebrile and if bed available  ? ?Mobility Assessment (last 72 hours)   ? ? Mobility Assessment   ? ? [place] Name 09/21/21 0900 09/21/21 0854 09/20/21 1055 09/20/21 0821 09/19/21 1539  ? Does patient have an order for bedrest or is patient medically unstable No - Continue assessment -- -- No - Continue assessment --  ? What is the highest level of mobility  based on the progressive mobility assessment? Level 3 (Stands with assist) - Balance while standing  and cannot march in place Level 3 (Stands with assist) - Balance while standing  and cannot march in place Level 3 (Stands with assist) - Balance while standing  and cannot march in place Level 4 (Walks with assist in room) - Balance while marching in place and cannot step forward and back - Complete Level 4 (Walks with assist in room) - Balance while marching in place and cannot step forward and back - Complete  ? Is the above level different from baseline mobility prior to current illness? Yes - Recommend PT order -- -- Yes - Recommend PT order --  ? ? [place] Name 09/19/21 1430 09/19/21 0800 09/18/21 2200  ?  ?  ? Does patient have an order for bedrest or is patient medically unstable -- No - Continue assessment No - Continue assessment    ? What is the highest level of mobility based on the progressive mobility assessment? Level 4 (Walks with assist in room) - Balance while marching in place and cannot step forward and back - Complete Level 4 (Walks with assist in room) - Balance while marching in place and cannot step forward and back - Complete Level 4 (Walks with assist in room) - Balance while marching in place and cannot step forward and back - Complete    ? Is the above level different from baseline mobility prior to current illness? -- Yes - Recommend PT order --    ? ?  ?  ? ?  ?  ? ?Objective: ?Vitals last 24 hrs: ?Vitals:  ? 09/20/21 2041 09/20/21 2315 09/21/21 0537 09/21/21 0817  ?BP: 131/60 (!) 139/58 (!) 127/54 (!) 126/52  ?Pulse: 64 65 68 68  ?Resp: '18 16 20 20  '$ ?Temp: 99 ?F (37.2 ?C) 98.6 ?F (37 ?C) 98.1 ?F (36.7 ?C) 98.9 ?F (37.2 ?C)  ?TempSrc:    Oral  ?SpO2: 94% 92% 91% 90%  ?Weight:      ?Height:      ? ?Weight change:  ? ?Physical Examination: ?General exam: alert awake oriented, pleasant,,older than stated age, weak appearing. ?HEENT:Oral mucosa moist, Ear/Nose WNL grossly, dentition  normal. ?Respiratory system: bilaterally coarse crackles BS, no use of accessory muscle ?Cardiovascular system: S1 & S2 +, No JVD. ?Gastrointestinal system: Abdomen soft,NT,ND, BS+ ?Nervous System:Alert, awake, moving extremities and grossly nonfocal ?Extremities: Right hip surgical site with dressing in place, LE edema none,distal peripheral pulses palpable.  ?Skin: No rashes,no icterus. ?MSK: Normal muscle bulk,tone, power ? ?Medications reviewed:  ?Scheduled Meds: ? aspirin EC  81 mg Oral Daily  ? atorvastatin  80 mg Oral QHS  ? carvedilol  6.25 mg Oral BID WC  ? Chlorhexidine Gluconate Cloth  6 each Topical Daily  ? citalopram  20 mg Oral Daily  ? docusate sodium  100 mg Oral BID  ? enoxaparin (LOVENOX) injection  30 mg Subcutaneous Q24H  ? ferrous [id number] C-folic acid  1 capsule Oral BID  ? gabapentin  300 mg  Oral Daily  ? insulin aspart  0-15 Units Subcutaneous TID WC  ? insulin aspart  0-5 Units Subcutaneous QHS  ? mometasone-formoterol  2 puff Inhalation BID  ? pantoprazole  40 mg Oral Daily  ? tamsulosin  0.8 mg Oral QHS  ? traZODone  50 mg Oral QHS  ? ?Continuous Infusions: ? methocarbamol (ROBAXIN) IV    ? ? ?  ?Diet Order   ? ?       ?  Diet Carb Modified Fluid consistency: Thin; Room service appropriate? Yes  Diet effective now       ?  ? ?  ?  ? ?  ? ?Intake/Output Summary (Last 24 hours) at 09/21/2021 1128 ?Last data filed at 09/21/2021 2595 ?Gross per 24 hour  ?Intake --  ?Output 500 ml  ?Net -500 ml  ? ?Net IO Since Admission: 530.82 mL [09/21/21 1128]  ?Wt Readings from Last 3 Encounters:  ?09/14/21 72.6 kg  ?06/29/21 79.8 kg  ?04/15/20 72.6 kg  ?  ? ?Unresulted Labs (From admission, onward)  ? ?  Start     Ordered  ? 09/22/21 0500  Procalcitonin  Daily,   R     ?Question:  Specimen collection method  Answer:  Unit=Unit collect  ? 09/21/21 0926  ? 09/21/21 0500  CBC  Daily,   R     ?Question:  Specimen collection method  Answer:  Unit=Unit collect  ? 09/20/21 1506  ? 09/20/21 6387  Basic  metabolic panel  Daily,   R     ?Question:  Specimen collection method  Answer:  Unit=Unit collect  ? 09/19/21 1310  ? 09/20/21 0500  Magnesium  Daily,   R     ?Question:  Specimen collection method  Answer:  Unit=Unit

## 2021-09-21 NOTE — TOC Progression Note (Signed)
Transition of Care (TOC) - Progression Note  ? ? ?Patient Details  ?Name: Phillip Reyes ?MRN: [id number] ?Date of Birth: [birth year] ? ?Transition of Care (TOC) CM/SW Contact  ?Conception Oms, RN ?Phone Number: ?09/21/2021, 9:26 AM ? ?Clinical Narrative:    ? ?Attempted to reach the patients spouse Pamala Hurry, daughter [name], and Caregiver [name], Left a general VM for Maryann Conners phone will not accept calls at this time and Acvonte's VM is full ? ?Expected Discharge Plan: [place]  ?Barriers to Discharge: Continued Medical Work up ? ?Expected Discharge Plan and Services ?Expected Discharge Plan: [place] ?  ?Discharge Planning Services: CM Consult ?  ?Living arrangements for the past 2 months: [place] ?                ?DME Arranged: N/A ?  ?  ?  ?  ?  ?  ?  ?  ?  ? ? ?Social Determinants of Health (SDOH) Interventions ?  ? ?Readmission Risk Interventions ?   ? View : No data to display.  ?  ?  ?  ? ? ?

## 2021-09-22 DIAGNOSIS — S728X1A Other fracture of right femur, initial encounter for closed fracture: Secondary | ICD-10-CM | POA: Diagnosis not present

## 2021-09-22 LAB — CBC
HCT: 25 % — ABNORMAL LOW (ref 39.0–52.0)
Hemoglobin: 8.2 g/dL — ABNORMAL LOW (ref 13.0–17.0)
MCH: 30.6 pg (ref 26.0–34.0)
MCHC: 32.8 g/dL (ref 30.0–36.0)
MCV: 93.3 fL (ref 80.0–100.0)
Platelets: 247 10*3/uL (ref 150–400)
RBC: 2.68 MIL/uL — ABNORMAL LOW (ref 4.22–5.81)
RDW: 13.4 % (ref 11.5–15.5)
WBC: 9.2 10*3/uL (ref 4.0–10.5)
nRBC: 0 % (ref 0.0–0.2)

## 2021-09-22 LAB — BASIC METABOLIC PANEL
Anion gap: 8 (ref 5–15)
BUN: 51 mg/dL — ABNORMAL HIGH (ref 8–23)
CO2: 26 mmol/L (ref 22–32)
Calcium: 8 mg/dL — ABNORMAL LOW (ref 8.9–10.3)
Chloride: 103 mmol/L (ref 98–111)
Creatinine, Ser: 3.55 mg/dL — ABNORMAL HIGH (ref 0.61–1.24)
GFR, Estimated: 17 mL/min — ABNORMAL LOW (ref >60)
Glucose, Bld: 189 mg/dL — ABNORMAL HIGH (ref 70–99)
Potassium: 4.1 mmol/L (ref 3.5–5.1)
Sodium: 137 mmol/L (ref 135–145)

## 2021-09-22 LAB — PROCALCITONIN: Procalcitonin: <0.1 ng/mL

## 2021-09-22 LAB — MAGNESIUM: Magnesium: 2.4 mg/dL (ref 1.7–2.4)

## 2021-09-22 LAB — GLUCOSE, CAPILLARY
Glucose-Capillary: 172 mg/dL — ABNORMAL HIGH (ref 70–99)
Glucose-Capillary: 249 mg/dL — ABNORMAL HIGH (ref 70–99)
Glucose-Capillary: 164 mg/dL — ABNORMAL HIGH (ref 70–99)
Glucose-Capillary: 138 mg/dL — ABNORMAL HIGH (ref 70–99)

## 2021-09-22 NOTE — TOC Progression Note (Signed)
Transition of Care [facility]) - Progression Note    Patient Details  Name: Phillip Reyes MRN: [id number] Date of Birth: 1941/02/21  Transition [facility] CM/SW [place], RN Phone Number: 09/22/2021, 12:20 PM  Clinical Narrative:     Ins still pending  Expected Discharge Plan: [place] Barriers to Discharge: Continued Medical Work up  Expected Discharge Plan and Services Expected Discharge Plan: [place]   Discharge Planning Services: CM Consult   Living arrangements for the past 2 months: Single Family Home                 DME Arranged: N/A                     Social Determinants of Health (SDOH) Interventions    Readmission Risk Interventions     View : No data to display.

## 2021-09-22 NOTE — Progress Notes (Signed)
PROGRESS NOTE Phillip Reyes  OAC:[id number] DOB: Oct 29, 1940 DOA: 09/14/2021 PCP: [facility]   Brief Narrative/Hospital Course: 81yo admitted with came with fall and hip fracture and is status post surgery, had many complications, with AKI on already w/ CKD4,has had worsening anemia, and then developed fever 09/20/21- cxr UA ordered. TOC working on SNF.    Subjective: Resting comfortably denies any issues.  Pending SNF placement.   Assessment and Plan: Principal Problem:   Other fracture of right femur, initial encounter for closed fracture ([place]) Active Problems:   Type 2 diabetes mellitus ([place])   HTN (hypertension)   COPD (chronic obstructive pulmonary disease) (HCC)   H/O: stroke   AKI (acute kidney injury) ([place])   Chronic systolic CHF (congestive heart failure) (HCC)   CKD (chronic kidney disease) stage 4, GFR 15-29 ml/min (HCC)   Hyperkalemia   Dysphagia   Delirium   Acute on chronic anemia   Fever   Fracture right femur : S/p INTRAMEDULLARY (IM) NAIL on 5/11 Fracture due to accidental fall, appreciate orthopedic input, continue PT OT, pain control.  Awaiting for skilled nursing facility placement.  On Lovenox  x 2 wk and scd x 6 wk for DVT prophylaxis as per orthopedics.  Fever transient: Episode of fever 100.9 on 5/15 4 pm, No recurrence.COVID flu negative, chest x-ray blood culture was sent 5/16-?Mild Right upper lobe infiltrate/pneumonia.Mild Right upper lobe infiltrate/pneumonia.No leukocytosis, procalcitonin 0.17.Complete course of Augmentin for possible pulmonary infection.   Acute on chronic anemia: Acute blood loss from surgery: s/p 1u pRBC for Hgb 7.0. Monitor Recent Labs  Lab 09/18/21 1715 09/19/21 0445 09/20/21 0304 09/21/21 0554 09/22/21 0614  HGB 8.3* 7.5* 7.5* 8.0* 8.2*  HCT 25.2* 23.3* 22.7* 24.2* 25.0*     Delirium: Multifactorial, limiting opioid use.  Mental status stable and improved  Dysphagia: Appreciate SLP eval, initially on  nectar-thick liquid during acute illness, now back on thin liquid.  Hyperkalemia: In the setting of CKD, Kayexalate x2.  Stable Recent Labs  Lab 09/18/21 0501 09/19/21 0445 09/20/21 0304 09/21/21 0554 09/22/21 0614  K 3.7 3.4* 3.6 3.8 4.1     AKI on CKD CKD stage 4, GFR 15-29 ml/min: Creatinine improved and stable after IV fluids stable  in mid 3- 4s Recent Labs  Lab 09/18/21 0501 09/19/21 0445 09/20/21 0304 09/21/21 0554 09/22/21 0614  BUN 49* 49* 52* 51* 51*  CREATININE 4.31* 4.37* 4.02* 3.83* 3.55*     Chronic systolic CHF: Compensated.Last LVEF 40 to 45% in Feb 2023. Net IO Since Admission: 1,009.82 mL [09/22/21 1052]   H/O: stroke: cont ASA.  Resume statin upon discharge.  COPD:Respiratory status stable.  Continue home Symbicort as Dulera.  Bronchodilators, IS.FV  HTN:BP well controlled . Amlodipine remains on HOLD.  Type 2 diabetes mellitus:recent A1c 6.7, well controlled. Cont to hold home long-acting insulin and keep SSI for now Recent Labs  Lab 09/21/21 0817 09/21/21 1225 09/21/21 1551 09/21/21 2026 09/22/21 0803  GLUCAP 130* 171* 121* 229* 172*     DVT prophylaxis: enoxaparin (LOVENOX) injection 30 mg Start: 09/16/21 0800 SCDs Start: 09/15/21 1836 Place TED hose Start: 09/15/21 1836 SCDs Start: 09/14/21 1836 Code Status:   Code Status: Full Code Family Communication: plan of care discussed with patient at bedside. Patient status is: Inpatient because of ongoing management of male.  Pending placement Level of care: Med-Surg   Dispo: The patient is from:home            Anticipated disposition:SNF once bed available  Mobility Assessment (last 72 hours)     Mobility Assessment     Row Name 09/22/21 0742 09/21/21 2116 09/21/21 0900 09/21/21 0854 09/20/21 1055   Does patient have an order for bedrest or is patient medically unstable No - Continue assessment No - Continue assessment No - Continue assessment -- --   What is the highest level of  mobility based on the progressive mobility assessment? Level 3 (Stands with assist) - Balance while standing  and cannot march in place Level 3 (Stands with assist) - Balance while standing  and cannot march in place Level 3 (Stands with assist) - Balance while standing  and cannot march in place Level 3 (Stands with assist) - Balance while standing  and cannot march in place Level 3 (Stands with assist) - Balance while standing  and cannot march in place   Is the above level different from baseline mobility prior to current illness? Yes - Recommend PT order Yes - Recommend PT order Yes - Recommend PT order -- --    Row Name 09/20/21 9470 09/19/21 1539 09/19/21 1430       Does patient have an order for bedrest or is patient medically unstable No - Continue assessment -- --     What is the highest level of mobility based on the progressive mobility assessment? Level 4 (Walks with assist in room) - Balance while marching in place and cannot step forward and back - Complete Level 4 (Walks with assist in room) - Balance while marching in place and cannot step forward and back - Complete Level 4 (Walks with assist in room) - Balance while marching in place and cannot step forward and back - Complete     Is the above level different from baseline mobility prior to current illness? Yes - Recommend PT order -- --               Objective: Vitals last 24 hrs: Vitals:   09/21/21 1957 09/22/21 0009 09/22/21 0455 09/22/21 0834  BP: (!) 130/53 (!) 130/57 (!) 134/58 (!) 143/49  Pulse: (!) 59 (!) 59 (!) 58 65  Resp: '18 17 16   '$ Temp: 97.8 F (36.6 C) 98 F (36.7 C) 98.2 F (36.8 C) 97.8 F (36.6 C)  TempSrc:      SpO2: 94% 95% 94% 97%  Weight:      Height:       Weight change:   Physical Examination: General exam: AAOX3, older than stated age, weak appearing. HEENT:Oral mucosa moist, Ear/Nose WNL grossly, dentition normal. Respiratory system: bilaterally diminished, no use of accessory  muscle Cardiovascular system: S1 & S2 +, No JVD,. Gastrointestinal system: Abdomen soft,NT,ND,BS+ Nervous System:Alert, awake, moving extremities and grossly nonfocal Extremities: LE ankle edema NONE, right right hip surgical site with dressing in place, distal peripheral pulses palpable.  Skin: No rashes,no icterus. MSK: Normal muscle bulk,tone, power   Medications reviewed:  Scheduled Meds:  amoxicillin-clavulanate  1 tablet Oral Q12H   aspirin EC  81 mg Oral Daily   atorvastatin  80 mg Oral QHS   carvedilol  6.25 mg Oral BID WC   Chlorhexidine Gluconate Cloth  6 each Topical Daily   citalopram  20 mg Oral Daily   docusate sodium  100 mg Oral BID   enoxaparin (LOVENOX) injection  30 mg Subcutaneous Q24H   ferrous [id number] C-folic acid  1 capsule Oral BID   gabapentin  300 mg Oral Daily   insulin aspart  0-15 Units Subcutaneous TID  WC   insulin aspart  0-5 Units Subcutaneous QHS   mometasone-formoterol  2 puff Inhalation BID   pantoprazole  40 mg Oral Daily   tamsulosin  0.8 mg Oral QHS   traZODone  50 mg Oral QHS   Continuous Infusions:  methocarbamol (ROBAXIN) IV        Diet Order             Diet Carb Modified Fluid consistency: Thin; Room service appropriate? Yes  Diet effective now                  Intake/Output Summary (Last 24 hours) at 09/22/2021 1052 Last data filed at 09/22/2021 1025 Gross per 24 hour  Intake 480 ml  Output 1 ml  Net 479 ml    Net IO Since Admission: 1,009.82 mL [09/22/21 1052]  Wt Readings from Last 3 Encounters:  09/14/21 72.6 kg  06/29/21 79.8 kg  04/15/20 72.6 kg     Unresulted Labs (From admission, onward)     Start     Ordered   09/22/21 0500  Procalcitonin  Daily,   R     Question:  Specimen collection method  Answer:  Unit=Unit collect   09/21/21 0926   09/21/21 0500  CBC  Daily,   R     Question:  Specimen collection method  Answer:  Unit=Unit collect   09/20/21 1506   09/20/21 8185  Basic metabolic  panel  Daily,   R     Question:  Specimen collection method  Answer:  Unit=Unit collect   09/19/21 1310   09/20/21 0500  Magnesium  Daily,   R     Question:  Specimen collection method  Answer:  Unit=Unit collect   09/19/21 1310          Data Reviewed: I have personally reviewed following labs and imaging studies CBC: Recent Labs  Lab 09/18/21 0501 09/18/21 1715 09/19/21 0445 09/20/21 0304 09/21/21 0554 09/22/21 0614  WBC 9.3  --  8.7 8.6 7.9 9.2  NEUTROABS  --   --   --  6.3  --   --   HGB 7.0* 8.3* 7.5* 7.5* 8.0* 8.2*  HCT 21.3* 25.2* 23.3* 22.7* 24.2* 25.0*  MCV 93.8  --  93.2 92.7 92.0 93.3  PLT 127*  --  155 173 210 631    Basic Metabolic Panel: Recent Labs  Lab 09/18/21 0501 09/19/21 0445 09/20/21 0304 09/21/21 0554 09/22/21 0614  NA 138 137 137 137 137  K 3.7 3.4* 3.6 3.8 4.1  CL 102 106 106 105 103  CO2 '22 24 23 25 26  '$ GLUCOSE 162* 119* 130* 117* 189*  BUN 49* 49* 52* 51* 51*  CREATININE 4.31* 4.37* 4.02* 3.83* 3.55*  CALCIUM 7.9* 7.8* 7.9* 8.2* 8.0*  MG 2.1 2.2 2.2 2.3 2.4    GFR: Estimated Creatinine Clearance: 17 mL/min (A) (by C-G formula based on SCr of 3.55 mg/dL (H)). Liver Function Tests: No results for input(s): AST, ALT, ALKPHOS, BILITOT, PROT, ALBUMIN in the last 168 hours. No results for input(s): LIPASE, AMYLASE in the last 168 hours. No results for input(s): AMMONIA in the last 168 hours. Coagulation Profile: No results for input(s): INR, PROTIME in the last 168 hours. BNP (last 3 results) No results for input(s): PROBNP in the last 8760 hours. HbA1C: No results for input(s): HGBA1C in the last 72 hours. CBG: Recent Labs  Lab 09/21/21 0817 09/21/21 1225 09/21/21 1551 09/21/21 2026 09/22/21 0803  GLUCAP 130* 171*  121* 229* 172*    Lipid Profile: No results for input(s): CHOL, HDL, LDLCALC, TRIG, CHOLHDL, LDLDIRECT in the last 72 hours. Thyroid Function Tests: No results for input(s): TSH, T4TOTAL, FREET4, T3FREE, THYROIDAB  in the last 72 hours. Sepsis Labs: Recent Labs  Lab 09/20/21 0304 09/21/21 0554 09/22/21 0614  PROCALCITON 0.25 0.17 <0.10     Recent Results (from the past 240 hour(s))  Surgical pcr screen     Status: None   Collection Time: 09/14/21 11:29 PM   Specimen: Nasal Mucosa; Nasal Swab  Result Value Ref Range Status   MRSA, PCR NEGATIVE NEGATIVE Final   Staphylococcus aureus NEGATIVE NEGATIVE Final    Comment: (NOTE) The Xpert SA Assay (FDA approved for NASAL specimens in patients 9 years of age and older), is one component of a comprehensive surveillance program. It is not intended to diagnose infection nor to guide or monitor treatment. Performed at [facility], [address] [place], [place] [address]   Resp Panel by RT-PCR (Flu A&B, Covid) Nasopharyngeal Swab     Status: None   Collection Time: 09/20/21  2:36 PM   Specimen: Nasopharyngeal Swab; Nasopharyngeal(NP) swabs in vial transport medium  Result Value Ref Range Status   SARS Coronavirus 2 by RT PCR NEGATIVE NEGATIVE Final    Comment: (NOTE) SARS-CoV-2 target nucleic acids are NOT DETECTED.  The SARS-CoV-2 RNA is generally detectable in upper respiratory specimens during the acute phase of infection. The lowest concentration of SARS-CoV-2 viral copies this assay can detect is 138 copies/mL. A negative result does not preclude SARS-Cov-2 infection and should not be used as the sole basis for treatment or other patient management decisions. A negative result may occur with  improper specimen collection/handling, submission of specimen other than nasopharyngeal swab, presence of viral mutation(s) within the areas targeted by this assay, and inadequate number of viral copies(<138 copies/mL). A negative result must be combined with clinical observations, patient history, and epidemiological information. The expected result is Negative.  Fact Sheet for Patients:   EntrepreneurPulse.com.au  Fact Sheet for Healthcare Providers:  IncredibleEmployment.be  This test is no t yet approved or cleared by the [place] FDA and  has been authorized for detection and/or diagnosis of SARS-CoV-2 by FDA under an Emergency Use Authorization (EUA). This EUA will remain  in effect (meaning this test can be used) for the duration of the COVID-19 declaration under Section 564(b)(1) of the Act, 21 U.S.C.section 360bbb-3(b)(1), unless the authorization is terminated  or revoked sooner.       Influenza A by PCR NEGATIVE NEGATIVE Final   Influenza B by PCR NEGATIVE NEGATIVE Final    Comment: (NOTE) The Xpert Xpress SARS-CoV-2/FLU/RSV plus assay is intended as an aid in the diagnosis of influenza from Nasopharyngeal swab specimens and should not be used as a sole basis for treatment. Nasal washings and aspirates are unacceptable for Xpert Xpress SARS-CoV-2/FLU/RSV testing.  Fact Sheet for Patients: EntrepreneurPulse.com.au  Fact Sheet for Healthcare Providers: IncredibleEmployment.be  This test is not yet approved or cleared by the [place] FDA and has been authorized for detection and/or diagnosis of SARS-CoV-2 by FDA under an Emergency Use Authorization (EUA). This EUA will remain in effect (meaning this test can be used) for the duration of the COVID-19 declaration under Section 564(b)(1) of the Act, 21 U.S.C. section 360bbb-3(b)(1), unless the authorization is terminated or revoked.  Performed at [facility], [address] [place], [place] [address]   Culture, blood (Routine X 2)  w Reflex to ID Panel     Status: None (Preliminary result)   Collection Time: 09/20/21  6:13 PM   Specimen: BLOOD  Result Value Ref Range Status   Specimen Description BLOOD LEFT ANTECUBITAL  Final   Special Requests   Final    ABDOMEN Blood Culture results may not be optimal due to  an excessive volume of blood received in culture bottles   Culture   Final    NO GROWTH 2 DAYS Performed at [facility], [address], [place], [place] [address]    Report Status PENDING  Incomplete  Culture, blood (Routine X 2) w Reflex to ID Panel     Status: None (Preliminary result)   Collection Time: 09/20/21  6:13 PM   Specimen: BLOOD  Result Value Ref Range Status   Specimen Description BLOOD BLOOD RIGHT HAND  Final   Special Requests ABDOMEN Blood Culture adequate volume  Final   Culture   Final    NO GROWTH 2 DAYS Performed at [facility], [address] [place], [place] [address]    Report Status PENDING  Incomplete     Antimicrobials: Anti-infectives (From admission, onward)    Start     Dose/Rate Route Frequency Ordered Stop   09/21/21 1315  amoxicillin-clavulanate (AUGMENTIN) 500-125 MG per tablet 500 mg        1 tablet Oral Every 12 hours 09/21/21 1225 09/26/21 0959   09/15/21 2100  ceFAZolin (ANCEF) IVPB 2g/100 mL premix        2 g 200 mL/hr over 30 Minutes Intravenous Every 6 hours 09/15/21 1835 09/16/21 0912   09/15/21 1405  ceFAZolin (ANCEF) 1-4 GM/50ML-% IVPB       Note to Pharmacy: [place] A: cabinet override      09/15/21 1405 09/15/21 1532   09/15/21 1400  ceFAZolin (ANCEF) IVPB 1 g/50 mL premix        1 g 100 mL/hr over 30 Minutes Intravenous 60 min pre-op 09/14/21 1810 09/15/21 1510      Culture/Microbiology    Component Value Date/Time   SDES BLOOD LEFT ANTECUBITAL 09/20/2021 1813   SDES BLOOD BLOOD RIGHT HAND 09/20/2021 1813   SPECREQUEST  09/20/2021 1813    ABDOMEN Blood Culture results may not be optimal due to an excessive volume of blood received in culture bottles   SPECREQUEST ABDOMEN Blood Culture adequate volume 09/20/2021 1813   CULT  09/20/2021 1813    NO GROWTH 2 DAYS Performed at [facility], [address] [place], [place] [address]    [place]  09/20/2021 1813    NO GROWTH 2  DAYS Performed at [facility], [address] [place],  [address]    REPTSTATUS PENDING 09/20/2021 1813   REPTSTATUS PENDING 09/20/2021 1813    Other culture-see note Radiology Studies: DG Chest Port 1 View  Result Date: 09/20/2021 CLINICAL DATA:  Fever EXAM: PORTABLE CHEST 1 VIEW COMPARISON:  Portable exam 1710 hours compared to 09/14/2021 FINDINGS: LEFT subclavian ICD leads stable projecting over RIGHT atrium and RIGHT ventricle. Normal heart size, mediastinal contours, and pulmonary vascularity. Atherosclerotic calcification aorta. RIGHT upper lobe infiltrate question pneumonia. Slight accentuation of interstitial markings in remaining lungs though less pronounced than RIGHT upper lobe. No pleural effusion or pneumothorax. Bones demineralized. IMPRESSION: Question mild RIGHT upper lobe infiltrate/pneumonia. Electronically Signed   By: Lavonia Dana M.D.   On: 09/20/2021 17:42     LOS: 8 days   Antonieta Pert, MD [facility]  09/22/2021, 10:52 AM

## 2021-09-22 NOTE — Progress Notes (Signed)
   Subjective: 7 Days Post-Op Procedure(s) (LRB): INTRAMEDULLARY (IM) NAIL INTERTROCHANTRIC (Right) Patient reports pain as mild.   Patient is well, and has had no acute complaints or problems Denies any CP, SOB, ABD pain. We will continue therapy today.   Objective: Vital signs in last 24 hours: Temp:  [97.8 F (36.6 C)-98.2 F (36.8 C)] 97.8 F (36.6 C) (05/18 0834) Pulse Rate:  [58-65] 65 (05/18 0834) Resp:  [16-18] 16 (05/18 0455) BP: (123-143)/(49-58) 143/49 (05/18 0834) SpO2:  [94 %-99 %] 97 % (05/18 0834)  Intake/Output from previous day: 05/17 0701 - 05/18 0700 In: -  Out: 301 [Urine:300; Stool:1] Intake/Output this shift: Total I/O In: 480 [P.O.:480] Out: -   Recent Labs    09/20/21 0304 09/21/21 0554 09/22/21 0614  HGB 7.5* 8.0* 8.2*   Recent Labs    09/21/21 0554 09/22/21 0614  WBC 7.9 9.2  RBC 2.63* 2.68*  HCT 24.2* 25.0*  PLT 210 247   Recent Labs    09/21/21 0554 09/22/21 0614  NA 137 137  K 3.8 4.1  CL 105 103  CO2 25 26  BUN 51* 51*  CREATININE 3.83* 3.55*  GLUCOSE 117* 189*  CALCIUM 8.2* 8.0*   No results for input(s): LABPT, INR in the last 72 hours.  EXAM General - Patient is Alert, Appropriate, and Oriented Extremity - Neurovascular intact Sensation intact distally Intact pulses distally Dressing - no drainage, CDI Motor Function - intact, moving foot and toes well on exam.   Past Medical History:  Diagnosis Date   Arthritis    Cancer ([place])    COPD (chronic obstructive pulmonary disease) (HCC)    Diabetes mellitus without complication (HCC)    Hypertension    Renal disorder    Stroke ([place])     Assessment/Plan:   7 Days Post-Op Procedure(s) (LRB): INTRAMEDULLARY (IM) NAIL INTERTROCHANTRIC (Right) Principal Problem:   Other fracture of right femur, initial encounter for closed fracture ([facility]) Active Problems:   Type 2 diabetes mellitus (HCC)   HTN (hypertension)   COPD (chronic obstructive pulmonary disease)  (HCC)   H/O: stroke   AKI (acute kidney injury) (HCC)   Chronic systolic CHF (congestive heart failure) (HCC)   CKD (chronic kidney disease) stage 4, GFR 15-29 ml/min (HCC)   Hyperkalemia   Dysphagia   Delirium   Acute on chronic anemia   Fever  Estimated body mass index is 22.96 kg/m as calculated from the following:   Height as of this encounter: '5\' 10"'$  (1.778 m).   Weight as of this encounter: 72.6 kg. Advance diet Up with therapy VSS Acute post op blood loss anemia - Hgb 8.2 stable, trending up. s/p 1 unit PRBC. Continue with Fe supplement.   CM to assist with discharge to SNF  Follow up with [place] ortho in 2 weeks TED hose BLE x 6 weeks Lovenox 40 mg SQ daily X 14 days  DVT Prophylaxis - Lovenox, TED hose, and SCDs Weight-Bearing as tolerated to right leg   T. Rachelle Hora, PA-C [place] 09/22/2021, 10:44 AM

## 2021-09-22 NOTE — Progress Notes (Signed)
Physical Therapy Treatment Patient Details Name: Phillip Reyes MRN: [id number] DOB: Feb 21, 1941 Today's Date: 09/22/2021   History of Present Illness Pt is 81 y.o. male who was admitted to [facility] after a fall, s/p R hip IM nailing. PMHx includes: Acute kidney disease, Anemia, HTN, CVA, PVD, and Dyslipidemia. Has had some hospitalizations recently.    PT Comments    Pt was sitting in recliner upon arriving. Much more alert today however continues to be disoriented x 3. He is only oriented to self.  Pleasantly confused but able to follow commands throughout. He stood several times. Was able to advance to ambulation ~ 4 ft with RLE buckling. May benefit from knee immobilizer. Will reach out to MD. Pt is progressing however slowly. Pain and cognition has been limiting. He will greatly benefit from SNF at [place] to address deficits while maximizing independence with ADLs.    Recommendations for follow up therapy are one component of a multi-disciplinary discharge planning process, led by the attending physician.  Recommendations may be updated based on patient status, additional functional criteria and insurance authorization.  Follow Up Recommendations  Skilled nursing-short term rehab (<3 hours/day)     Assistance Recommended at Discharge Frequent or constant Supervision/Assistance  Patient can return home with the following Two people to help with walking and/or transfers;Direct supervision/assist for medications management;Help with stairs or ramp for entrance;Assist for transportation;Assistance with cooking/housework;A lot of help with bathing/dressing/bathroom   Equipment Recommendations  Other (comment) (defer to next level of care)       Precautions / Restrictions Precautions Precautions: Fall Restrictions Weight Bearing Restrictions: Yes RLE Weight Bearing: Weight bearing as tolerated     Mobility  Bed Mobility    General bed mobility comments: in recliner pre/post session     Transfers Overall transfer level: Needs assistance Equipment used: Rolling walker (2 wheels) Transfers: Sit to/from Stand Sit to Stand: Mod assist    General transfer comment: pt stood to RW 3 x with slightly improved technique. Mod assist to stand with max vcs for technique and sequencing.    Ambulation/Gait Ambulation/Gait assistance: Max assist Gait Distance (Feet): 4 Feet Assistive device: Rolling walker (2 wheels) Gait Pattern/deviations: Step-to pattern, Antalgic Gait velocity: decreased     General Gait Details: slight knee buckling with LLE advancement. pt's very pain limited      Balance Overall balance assessment: Needs assistance Sitting-balance support: Bilateral upper extremity supported, Feet supported Sitting balance-Leahy Scale: Good     Standing balance support: Bilateral upper extremity supported, During functional activity, Reliant on assistive device for balance Standing balance-Leahy Scale: Poor     Cognition Arousal/Alertness: Awake/alert Behavior During Therapy: WFL for tasks assessed/performed Overall Cognitive Status: Within Functional Limits for tasks assessed      General Comments: Pt is alert but disoriented. Unaware of location, why he is in the hospital. Pt's pain and cognition greatly impacts progress.           General Comments General comments (skin integrity, edema, etc.): pt performed several ther ex to tolerance whiel sitting in recliner. continues to be only able to tolerate minimal exercise due to pain.      Pertinent Vitals/Pain Pain Assessment Pain Assessment: 0-10 Pain Score: 9  Faces Pain Scale: Hurts whole lot Pain Location: R LE with wt bearing Pain Descriptors / Indicators: Grimacing, Sore, Aching, Pressure, Guarding Pain Intervention(s): Limited activity within patient's tolerance, Monitored during session, Premedicated before session, Repositioned     PT Goals (current goals can now be found in  the care plan  section) Acute Rehab PT Goals Patient Stated Goal: less pain Progress towards PT goals: Progressing toward goals    Frequency    7X/week      PT Plan Current plan remains appropriate    Co-evaluation     PT goals addressed during session: Mobility/safety with mobility;Balance;Strengthening/ROM;Proper use of DME        AM-PAC PT "6 Clicks" Mobility   Outcome Measure  Help needed turning from your back to your side while in a flat bed without using bedrails?: A Lot Help needed moving from lying on your back to sitting on the side of a flat bed without using bedrails?: A Lot Help needed moving to and from a bed to a chair (including a wheelchair)?: A Lot Help needed standing up from a chair using your arms (e.g., wheelchair or bedside chair)?: A Lot Help needed to walk in hospital room?: A Lot Help needed climbing 3-5 steps with a railing? : A Lot 6 Click Score: 12    End of Session Equipment Utilized During Treatment: Gait belt Activity Tolerance: Patient limited by pain Patient left: in chair;with call bell/phone within reach;with chair alarm set Nurse Communication: Mobility status PT Visit Diagnosis: Difficulty in walking, not elsewhere classified (R26.2);Other abnormalities of gait and mobility (R26.89);Muscle weakness (generalized) (M62.81);Pain Pain - Right/Left: Right Pain - part of body: Hip     Time: 6599-3570 PT Time Calculation (min) (ACUTE ONLY): 23 min  Charges:  $Therapeutic Exercise: 8-22 mins $Therapeutic Activity: 8-22 mins                    Julaine Fusi PTA 09/22/21, 11:25 AM

## 2021-09-22 NOTE — Discharge Summary (Signed)
Physician Discharge Summary  Serenity Fortner OXB:[id number] DOB: May 15, 1940 DOA: 09/14/2021  PCP: [facility]  Admit date: 09/14/2021 Discharge date: 09/23/2021 Recommendations for Outpatient Follow-up:  Follow up with PCP in 1 weeks-call for appointment Please obtain BMP/CBC in one week  Discharge Dispo: snf Discharge Condition: Stable Code Status:   Code Status: Full Code Diet recommendation:  Diet Order             Diet - low sodium heart healthy           Diet Carb Modified Fluid consistency: Thin; Room service appropriate? Yes  Diet effective now                    Brief/Interim Summary: 81yo admitted with came with fall and hip fracture and is status post surgery, had many complications, with AKI on already w/ CKD4,has had worsening anemia, and then developed fever 09/20/21- cxr UA ordered. TOC working on SNF.  Had episode of transient fever empirically treating for pneumonia coverage.  At this time remains medically stable for discharge to skilled nursing facility.  He will follow-up with orthopedic for postop care Insurance Josem Kaufmann is available for dc to snf today  Discharge Diagnoses:  Principal Problem:   Other fracture of right femur, initial encounter for closed fracture ([place]) Active Problems:   Type 2 diabetes mellitus ([place])   HTN (hypertension)   COPD (chronic obstructive pulmonary disease) ([place])   H/O: stroke   AKI (acute kidney injury) ([place])   Chronic systolic CHF (congestive heart failure) (HCC)   CKD (chronic kidney disease) stage 4, GFR 15-29 ml/min (H[facility])   Hyperkalemia   Dysphagia   Delirium   Acute on chronic anemia   Fever  Fracture right femur : S/p INTRAMEDULLARY (IM) NAIL on 5/11 Fracture due to accidental fall, appreciate orthopedic input, continue PT OT, pain control.  Stable for skilled nursing facility.  On Lovenox for DVT prophylaxisX 14 days as per orthopedics.   Fever transient: Episode of fever 100.9 on 5/15 4 pm, No  recurrence.  COVID flu negative, chest x-ray blood culture was sent 5/16-?  Mild Right upper lobe infiltrate/pneumonia.  No leukocytosis, procalcitonin 0.17.  Complete course of Augmentin for possible pulmonary infection    Acute on chronic anemia: Acute blood loss from surgery: s/p 1u pRBC for Hgb 7.0. Monitor CBC in 1 week at facility Recent Labs  Lab 09/19/21 0445 09/20/21 0304 09/21/21 0554 09/22/21 0614 09/23/21 0620  HGB 7.5* 7.5* 8.0* 8.2* 7.8*  HCT 23.3* 22.7* 24.2* 25.0* 23.8*    Delirium: Multifactorial, limiting opioid use.  Mental status stable and improved   Dysphagia: Appreciate SLP eval, initially on nectar-thick liquid during acute illness, now back on thin liquid.   Hyperkalemia: In the setting of CKD, Kayexalate x2.  Stable  AKI on CKD CKD stage 4, GFR 15-29 ml/min: Creatinine improved to 3.1, encourage oral hydration,  Recent Labs  Lab 09/19/21 0445 09/20/21 0304 09/21/21 0554 09/22/21 0614 09/23/21 0620  BUN 49* 52* 51* 51* 50*  CREATININE 4.37* 4.02* 3.83* 3.55* 3.14*    Chronic systolic CHF: Compensated.Last LVEF 40 to 45% in Feb 2023.Net IO Since Admission: 59.82 mL [09/23/21 0923]    H/O: stroke: cont ASA.  Resume statin upon discharge.   COPD:Respiratory status stable. Has chronic wheezing but no shortness of breath and not on oxygen. Continue home Symbicort as Dulera.  Bronchodilators   HTN:BP well controlled . Amlodipine remains on HOLD.   Type 2 diabetes mellitus:recent  A1c 6.7, well controlled. Cont to hold home long-acting insulin and keep SSI for now   Consults: ORTHO Subjective: Aaox3, no shortness of breaht , has wheezing- chronic Overnight afebrile.  Doing well on room air.  Discharge Exam: Vitals:   09/23/21 0434 09/23/21 0758  BP: (!) 135/58 (!) 150/60  Pulse: (!) 59 (!) 59  Resp: 18   Temp: 98.7 F (37.1 C) 97.7 F (36.5 C)  SpO2: 93% 97%   General: Pt is alert, awake, not in acute distress Cardiovascular: RRR, S1/S2  +, no rubs, no gallops Respiratory: CTA bilaterally, no wheezing, no rhonchi Abdominal: Soft, NT, ND, bowel sounds + Extremities: no edema, no cyanosis  Discharge Instructions  Discharge Instructions     Diet - low sodium heart healthy   Complete by: As directed    Discharge instructions   Complete by: As directed    Check CBC BMP in 1 week.  Follow-up with orthopedics for postop  Please call call MD or return to ER for similar or worsening recurring problem that brought you to hospital or if any fever,nausea/vomiting,abdominal pain, uncontrolled pain, chest pain,  shortness of breath or any other alarming symptoms.  Please follow-up your doctor as instructed in a week time and call the office for appointment.  Please avoid alcohol, smoking, or any other illicit substance and maintain healthy habits including taking your regular medications as prescribed.  You were cared for by a hospitalist during your hospital stay. If you have any questions about your discharge medications or the care you received while you were in the hospital after you are discharged, you can call the unit and ask to speak with the hospitalist on call if the hospitalist that took care of you is not available.  Once you are discharged, your primary care physician will handle any further medical issues. Please note that NO REFILLS for any discharge medications will be authorized once you are discharged, as it is imperative that you return to your primary care physician (or establish a relationship with a primary care physician if you do not have one) for your aftercare needs so that they can reassess your need for medications and monitor your lab values   Discharge wound care:   Complete by: As directed    Reinforce the dressing until discontinued follow-up with orthopedic surgery 2 weeks from postop   Increase activity slowly   Complete by: As directed       Allergies as of 09/23/2021       Reactions   Norco  [hydrocodone-acetaminophen] Shortness Of Breath        Medication List     STOP taking these medications    amLODipine 10 MG tablet Commonly known as: NORVASC   furosemide 40 MG tablet Commonly known as: LASIX   lisinopril 40 MG tablet Commonly known as: ZESTRIL       TAKE these medications    acetaminophen 500 MG tablet Commonly known as: TYLENOL Take 2 tablets (1,000 mg total) by mouth 3 (three) times daily as needed for mild pain, moderate pain or headache. What changed:  medication strength how much to take when to take this reasons to take this   amoxicillin-clavulanate 500-125 MG tablet Commonly known as: AUGMENTIN Take 1 tablet (500 mg total) by mouth every 12 (twelve) hours for 4 days.   aspirin EC 81 MG tablet Take 81 mg by mouth daily.   atorvastatin 80 MG tablet Commonly known as: LIPITOR Take 80 mg by mouth at  bedtime.   azelastine 0.1 % nasal spray Commonly known as: ASTELIN Place 1 spray into both nostrils 2 (two) times daily as needed for rhinitis.   budesonide-formoterol 160-4.5 MCG/ACT inhaler Commonly known as: SYMBICORT Inhale 2 puffs into the lungs 2 (two) times daily.   carvedilol 6.25 MG tablet Commonly known as: COREG Take 1 tablet (6.25 mg total) by mouth 2 (two) times daily with a meal. What changed:  medication strength how much to take   citalopram 20 MG tablet Commonly known as: CELEXA Take 20 mg by mouth daily.   docusate sodium 100 MG capsule Commonly known as: COLACE Take 1 capsule (100 mg total) by mouth 2 (two) times daily.   enoxaparin 40 MG/0.4ML injection Commonly known as: LOVENOX Inject 0.4 mLs (40 mg total) into the skin daily for 14 days.   ferrous [id number] C-folic acid capsule Commonly known as: TRINSICON / FOLTRIN Take 1 capsule by mouth 2 (two) times daily.   gabapentin 300 MG capsule Commonly known as: NEURONTIN Take 300 mg by mouth daily.   insulin glargine 100 UNIT/ML  injection Commonly known as: LANTUS Inject 10 Units into the skin at bedtime.   insulin regular 100 units/mL injection Commonly known as: NOVOLIN R Inject 3-10 Units into the skin 3 (three) times daily before meals.   ipratropium-albuterol 0.5-2.5 (3) MG/3ML Soln Commonly known as: DUONEB Take 3 mLs by nebulization every 6 (six) hours as needed.   omeprazole 20 MG capsule Commonly known as: PRILOSEC Take 20 mg by mouth daily.   tamsulosin 0.4 MG Caps capsule Commonly known as: FLOMAX Take 0.8 mg by mouth at bedtime.   traZODone 50 MG tablet Commonly known as: DESYREL Take 50 mg by mouth at bedtime.   vitamin B-12 1000 MCG tablet Commonly known as: CYANOCOBALAMIN Take 1,000 mcg by mouth daily.   Vitamin D 50 MCG (2000 UT) Caps Take 1 capsule by mouth daily.               Discharge Care Instructions  (From admission, onward)           Start     Ordered   09/23/21 0000  Discharge wound care:       Comments: Reinforce the dressing until discontinued follow-up with orthopedic surgery 2 weeks from postop   09/23/21 0732            Contact information for follow-up providers     Duanne Guess, PA-C Follow up in 2 week(s).   Specialties: Orthopedic Surgery, Emergency Medicine Contact information: [place] [id number] [phone]              Contact information for after-discharge care     Destination     [facility] Preferred SNF .   Service: Skilled Nursing Contact information: [place] [phone]                    Allergies  Allergen Reactions   Norco [Hydrocodone-Acetaminophen] Shortness Of Breath    The results of significant diagnostics from this hospitalization (including imaging, microbiology, ancillary and laboratory) are listed below for reference.    Microbiology: Recent Results (from the past 240 hour(s))  Surgical pcr screen      Status: None   Collection Time: 09/14/21 11:29 PM   Specimen: Nasal Mucosa; Nasal Swab  Result Value Ref Range Status   MRSA, PCR NEGATIVE NEGATIVE Final   Staphylococcus aureus NEGATIVE NEGATIVE  Final    Comment: (NOTE) The Xpert SA Assay (FDA approved for NASAL specimens in patients 81 years of age and older), is one component of a comprehensive surveillance program. It is not intended to diagnose infection nor to guide or monitor treatment. Performed at [facility], [address] [place], [place] [address]   Resp Panel by RT-PCR (Flu A&B, Covid) Nasopharyngeal Swab     Status: None   Collection Time: 09/20/21  2:36 PM   Specimen: Nasopharyngeal Swab; Nasopharyngeal(NP) swabs in vial transport medium  Result Value Ref Range Status   SARS Coronavirus 2 by RT PCR NEGATIVE NEGATIVE Final    Comment: (NOTE) SARS-CoV-2 target nucleic acids are NOT DETECTED.  The SARS-CoV-2 RNA is generally detectable in upper respiratory specimens during the acute phase of infection. The lowest concentration of SARS-CoV-2 viral copies this assay can detect is 138 copies/mL. A negative result does not preclude SARS-Cov-2 infection and should not be used as the sole basis for treatment or other patient management decisions. A negative result may occur with  improper specimen collection/handling, submission of specimen other than nasopharyngeal swab, presence of viral mutation(s) within the areas targeted by this assay, and inadequate number of viral copies(<138 copies/mL). A negative result must be combined with clinical observations, patient history, and epidemiological information. The expected result is Negative.  Fact Sheet for Patients:  EntrepreneurPulse.com.au  Fact Sheet for Healthcare Providers:  IncredibleEmployment.be  This test is no t yet approved or cleared by the [place] FDA and  has been authorized for detection and/or  diagnosis of SARS-CoV-2 by FDA under an Emergency Use Authorization (EUA). This EUA will remain  in effect (meaning this test can be used) for the duration of the COVID-19 declaration under Section 564(b)(1) of the Act, 21 U.S.C.section 360bbb-3(b)(1), unless the authorization is terminated  or revoked sooner.       Influenza A by PCR NEGATIVE NEGATIVE Final   Influenza B by PCR NEGATIVE NEGATIVE Final    Comment: (NOTE) The Xpert Xpress SARS-CoV-2/FLU/RSV plus assay is intended as an aid in the diagnosis of influenza from Nasopharyngeal swab specimens and should not be used as a sole basis for treatment. Nasal washings and aspirates are unacceptable for Xpert Xpress SARS-CoV-2/FLU/RSV testing.  Fact Sheet for Patients: EntrepreneurPulse.com.au  Fact Sheet for Healthcare Providers: IncredibleEmployment.be  This test is not yet approved or cleared by the [place] FDA and has been authorized for detection and/or diagnosis of SARS-CoV-2 by FDA under an Emergency Use Authorization (EUA). This EUA will remain in effect (meaning this test can be used) for the duration of the COVID-19 declaration under Section 564(b)(1) of the Act, 21 U.S.C. section 360bbb-3(b)(1), unless the authorization is terminated or revoked.  Performed at [facility], [address] [place], [place] [address]   Culture, blood (Routine X 2) w Reflex to ID Panel     Status: None (Preliminary result)   Collection Time: 09/20/21  6:13 PM   Specimen: BLOOD  Result Value Ref Range Status   Specimen Description BLOOD LEFT ANTECUBITAL  Final   Special Requests   Final    ABDOMEN Blood Culture results may not be optimal due to an excessive volume of blood received in culture bottles   Culture   Final    NO GROWTH 3 DAYS Performed at [facility], [address] [place], [place] [address]    Report Status PENDING  Incomplete  Culture, blood (Routine X  2) w Reflex to  ID Panel     Status: None (Preliminary result)   Collection Time: 09/20/21  6:13 PM   Specimen: BLOOD  Result Value Ref Range Status   Specimen Description BLOOD BLOOD RIGHT HAND  Final   Special Requests ABDOMEN Blood Culture adequate volume  Final   Culture   Final    NO GROWTH 3 DAYS Performed at [facility], [address] [place], [place] [address]    Report Status PENDING  Incomplete    Procedures/Studies: DG Chest Port 1 View  Result Date: 09/20/2021 CLINICAL DATA:  Fever EXAM: PORTABLE CHEST 1 VIEW COMPARISON:  Portable exam 1710 hours compared to 09/14/2021 FINDINGS: LEFT subclavian ICD leads stable projecting over RIGHT atrium and RIGHT ventricle. Normal heart size, mediastinal contours, and pulmonary vascularity. Atherosclerotic calcification aorta. RIGHT upper lobe infiltrate question pneumonia. Slight accentuation of interstitial markings in remaining lungs though less pronounced than RIGHT upper lobe. No pleural effusion or pneumothorax. Bones demineralized. IMPRESSION: Question mild RIGHT upper lobe infiltrate/pneumonia. Electronically Signed   By: Lavonia Dana M.D.   On: 09/20/2021 17:42   DG Chest Portable 1 View  Result Date: 09/14/2021 CLINICAL DATA:  Preoperative respiratory exam for hip fracture. 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 Heart size is normal. The lungs are clear. The vascularity is normal. No acute bone finding. IMPRESSION: No active disease. Electronically Signed   By: Nelson Chimes M.D.   On: 09/14/2021 16:55   DG C-Arm 1-60 Min-No Report  Result Date: 09/15/2021 Fluoroscopy was utilized by the requesting physician.  No radiographic interpretation.   DG HIP UNILAT WITH PELVIS 2-3 VIEWS RIGHT  Result Date: 09/15/2021 CLINICAL DATA:  ORIF right femur fracture. EXAM: DG HIP (WITH OR WITHOUT PELVIS) 2-3V RIGHT COMPARISON:  Preoperative radiographs yesterday. FINDINGS: Four  fluoroscopic spot images of the right hip and femur obtained in the operating room. Long intramedullary nail with trans trochanteric and distal locking screw fixation of proximal femur fracture. Fluoroscopy time 1 minutes 31 seconds. Dose not provided. IMPRESSION: Fluoroscopic spot images during ORIF of right femur fracture. Electronically Signed   By: Keith Rake M.D.   On: 09/15/2021 16:09   DG Hip Unilat W or Wo Pelvis 2-3 Views Right  Result Date: 09/14/2021 CLINICAL DATA:  Trauma, fall EXAM: DG HIP (WITH OR WITHOUT PELVIS) 2-3V RIGHT COMPARISON:  None Available. FINDINGS: Comminuted intertrochanteric fracture is seen in the proximal right femur. There is medial displacement of lesser trochanter. There is 9.5 mm offset in alignment of fracture fragments along the greater tuberosity. There is no dislocation. Scattered vascular calcifications are seen. IMPRESSION: Comminuted displaced intertrochanteric fracture is seen in the proximal right femur. Electronically Signed   By: Elmer Picker M.D.   On: 09/14/2021 16:24    Labs: BNP (last 3 results) Recent Labs    06/22/21 1442  BNP 416.6*   Basic Metabolic Panel: Recent Labs  Lab 09/19/21 0445 09/20/21 0304 09/21/21 0554 09/22/21 0614 09/23/21 0620  NA 137 137 137 137 137  K 3.4* 3.6 3.8 4.1 4.3  CL 106 106 105 103 104  CO2 '24 23 25 26 25  '$ GLUCOSE 119* 130* 117* 189* 139*  BUN 49* 52* 51* 51* 50*  CREATININE 4.37* 4.02* 3.83* 3.55* 3.14*  CALCIUM 7.8* 7.9* 8.2* 8.0* 8.2*  MG 2.2 2.2 2.3 2.4 2.4   Liver Function Tests: No results for input(s): AST, ALT, ALKPHOS, BILITOT, PROT, ALBUMIN in the last 168 hours. No results for  input(s): LIPASE, AMYLASE in the last 168 hours. No results for input(s): AMMONIA in the last 168 hours. CBC: Recent Labs  Lab 09/19/21 0445 09/20/21 0304 09/21/21 0554 09/22/21 0614 09/23/21 0620  WBC 8.7 8.6 7.9 9.2 9.8  NEUTROABS  --  6.3  --   --   --   HGB 7.5* 7.5* 8.0* 8.2* 7.8*  HCT  23.3* 22.7* 24.2* 25.0* 23.8*  MCV 93.2 92.7 92.0 93.3 94.1  PLT 155 173 210 247 280   Cardiac Enzymes: No results for input(s): CKTOTAL, CKMB, CKMBINDEX, TROPONINI in the last 168 hours. BNP: Invalid input(s): POCBNP CBG: Recent Labs  Lab 09/22/21 0803 09/22/21 1143 09/22/21 1604 09/22/21 2100 09/23/21 0759  GLUCAP 172* 249* 164* 138* 142*   D-Dimer No results for input(s): DDIMER in the last 72 hours. Hgb A1c No results for input(s): HGBA1C in the last 72 hours. Lipid Profile No results for input(s): CHOL, HDL, LDLCALC, TRIG, CHOLHDL, LDLDIRECT in the last 72 hours. Thyroid function studies No results for input(s): TSH, T4TOTAL, T3FREE, THYROIDAB in the last 72 hours.  Invalid input(s): FREET3 Anemia work up No results for input(s): VITAMINB12, FOLATE, FERRITIN, TIBC, IRON, RETICCTPCT in the last 72 hours.  Urinalysis    Component Value Date/Time   COLORURINE YELLOW (A) 06/22/2021 1800   APPEARANCEUR CLEAR (A) 06/22/2021 1800   APPEARANCEUR Clear 02/17/2013 0013   LABSPEC 1.015 06/22/2021 1800   LABSPEC 1.029 02/17/2013 0013   PHURINE 5.0 06/22/2021 1800   GLUCOSEU NEGATIVE 06/22/2021 1800   GLUCOSEU >=500 02/17/2013 0013   HGBUR NEGATIVE 06/22/2021 1800   BILIRUBINUR NEGATIVE 06/22/2021 1800   BILIRUBINUR Negative 02/17/2013 0013   KETONESUR NEGATIVE 06/22/2021 1800   PROTEINUR NEGATIVE 06/22/2021 1800   NITRITE NEGATIVE 06/22/2021 1800   LEUKOCYTESUR NEGATIVE 06/22/2021 1800   LEUKOCYTESUR Negative 02/17/2013 0013   Sepsis Labs Invalid input(s): PROCALCITONIN,  WBC,  LACTICIDVEN Microbiology Recent Results (from the past 240 hour(s))  Surgical pcr screen     Status: None   Collection Time: 09/14/21 11:29 PM   Specimen: Nasal Mucosa; Nasal Swab  Result Value Ref Range Status   MRSA, PCR NEGATIVE NEGATIVE Final   Staphylococcus aureus NEGATIVE NEGATIVE Final    Comment: (NOTE) The Xpert SA Assay (FDA approved for NASAL specimens in patients 46 years of  age and older), is one component of a comprehensive surveillance program. It is not intended to diagnose infection nor to guide or monitor treatment. Performed at [facility], [address] [place], [place] [address]   Resp Panel by RT-PCR (Flu A&B, Covid) Nasopharyngeal Swab     Status: None   Collection Time: 09/20/21  2:36 PM   Specimen: Nasopharyngeal Swab; Nasopharyngeal(NP) swabs in vial transport medium  Result Value Ref Range Status   SARS Coronavirus 2 by RT PCR NEGATIVE NEGATIVE Final    Comment: (NOTE) SARS-CoV-2 target nucleic acids are NOT DETECTED.  The SARS-CoV-2 RNA is generally detectable in upper respiratory specimens during the acute phase of infection. The lowest concentration of SARS-CoV-2 viral copies this assay can detect is 138 copies/mL. A negative result does not preclude SARS-Cov-2 infection and should not be used as the sole basis for treatment or other patient management decisions. A negative result may occur with  improper specimen collection/handling, submission of specimen other than nasopharyngeal swab, presence of viral mutation(s) within the areas targeted by this assay, and inadequate number of viral copies(<138 copies/mL). A negative result must be combined with clinical observations, patient history, and epidemiological  information. The expected result is Negative.  Fact Sheet for Patients:  EntrepreneurPulse.com.au  Fact Sheet for Healthcare Providers:  IncredibleEmployment.be  This test is no t yet approved or cleared by the [place] FDA and  has been authorized for detection and/or diagnosis of SARS-CoV-2 by FDA under an Emergency Use Authorization (EUA). This EUA will remain  in effect (meaning this test can be used) for the duration of the COVID-19 declaration under Section 564(b)(1) of the Act, 21 U.S.C.section 360bbb-3(b)(1), unless the authorization is terminated  or revoked  sooner.       Influenza A by PCR NEGATIVE NEGATIVE Final   Influenza B by PCR NEGATIVE NEGATIVE Final    Comment: (NOTE) The Xpert Xpress SARS-CoV-2/FLU/RSV plus assay is intended as an aid in the diagnosis of influenza from Nasopharyngeal swab specimens and should not be used as a sole basis for treatment. Nasal washings and aspirates are unacceptable for Xpert Xpress SARS-CoV-2/FLU/RSV testing.  Fact Sheet for Patients: EntrepreneurPulse.com.au  Fact Sheet for Healthcare Providers: IncredibleEmployment.be  This test is not yet approved or cleared by the [place] FDA and has been authorized for detection and/or diagnosis of SARS-CoV-2 by FDA under an Emergency Use Authorization (EUA). This EUA will remain in effect (meaning this test can be used) for the duration of the COVID-19 declaration under Section 564(b)(1) of the Act, 21 U.S.C. section 360bbb-3(b)(1), unless the authorization is terminated or revoked.  Performed at [facility], [address] [place], [place] [address]   Culture, blood (Routine X 2) w Reflex to ID Panel     Status: None (Preliminary result)   Collection Time: 09/20/21  6:13 PM   Specimen: BLOOD  Result Value Ref Range Status   Specimen Description BLOOD LEFT ANTECUBITAL  Final   Special Requests   Final    ABDOMEN Blood Culture results may not be optimal due to an excessive volume of blood received in culture bottles   Culture   Final    NO GROWTH 3 DAYS Performed at [facility], [address] [place], [place] [address]    Report Status PENDING  Incomplete  Culture, blood (Routine X 2) w Reflex to ID Panel     Status: None (Preliminary result)   Collection Time: 09/20/21  6:13 PM   Specimen: BLOOD  Result Value Ref Range Status   Specimen Description BLOOD BLOOD RIGHT HAND  Final   Special Requests ABDOMEN Blood Culture adequate volume  Final   Culture   Final    NO GROWTH 3  DAYS Performed at [facility], [address] [place], [place] [address]    Report Status PENDING  Incomplete     Time coordinating discharge: 35 minutes  SIGNED: Antonieta Pert, MD  [facility] 09/23/2021, 9:23 AM  If 7PM-7AM, please contact night-coverage [email or web address]

## 2021-09-22 NOTE — TOC Progression Note (Signed)
Transition of Care [facility]) - Progression Note    Patient Details  Name: Phillip Reyes MRN: [id number] Date of Birth: 03-11-1941  [facility]) CM/SW [place], RN Phone Number: 09/22/2021, 2:18 PM  Clinical Narrative:    Received INS approval to go to [facility] [id number] next review date 09/25/21   Expected Discharge Plan: [place] Barriers to Discharge: Continued Medical Work up  Expected Discharge Plan and Services Expected Discharge Plan: [place]   Discharge Planning Services: CM Consult   Living arrangements for the past 2 months: Single Family Home                 DME Arranged: N/A                     Social Determinants of Health (SDOH) Interventions    Readmission Risk Interventions     View : No data to display.

## 2021-09-22 NOTE — Plan of Care (Signed)

## 2021-09-22 NOTE — TOC Progression Note (Signed)
Transition of Care [facility]) - Progression Note    Patient Details  Name: Phillip Reyes MRN: [id number] Date of Birth: 11-09-1940  Transition of Care [facility]) CM/SW [place], RN Phone Number: 09/22/2021, 8:54 AM  Clinical Narrative:     Ins is still pending for the patient to go to [facility]  Expected Discharge Plan: [place] Barriers to Discharge: Continued Medical Work up  Expected Discharge Plan and Services Expected Discharge Plan: [place]   Discharge Planning Services: CM Consult   Living arrangements for the past 2 months: Single Family Home                 DME Arranged: N/A                     Social Determinants of Health (SDOH) Interventions    Readmission Risk Interventions     View : No data to display.

## 2021-09-23 DIAGNOSIS — S728X1A Other fracture of right femur, initial encounter for closed fracture: Secondary | ICD-10-CM | POA: Diagnosis not present

## 2021-09-23 LAB — BASIC METABOLIC PANEL
Anion gap: 8 (ref 5–15)
BUN: 50 mg/dL — ABNORMAL HIGH (ref 8–23)
CO2: 25 mmol/L (ref 22–32)
Calcium: 8.2 mg/dL — ABNORMAL LOW (ref 8.9–10.3)
Chloride: 104 mmol/L (ref 98–111)
Creatinine, Ser: 3.14 mg/dL — ABNORMAL HIGH (ref 0.61–1.24)
GFR, Estimated: 19 mL/min — ABNORMAL LOW (ref >60)
Glucose, Bld: 139 mg/dL — ABNORMAL HIGH (ref 70–99)
Potassium: 4.3 mmol/L (ref 3.5–5.1)
Sodium: 137 mmol/L (ref 135–145)

## 2021-09-23 LAB — CBC
HCT: 23.8 % — ABNORMAL LOW (ref 39.0–52.0)
Hemoglobin: 7.8 g/dL — ABNORMAL LOW (ref 13.0–17.0)
MCH: 30.8 pg (ref 26.0–34.0)
MCHC: 32.8 g/dL (ref 30.0–36.0)
MCV: 94.1 fL (ref 80.0–100.0)
Platelets: 280 10*3/uL (ref 150–400)
RBC: 2.53 MIL/uL — ABNORMAL LOW (ref 4.22–5.81)
RDW: 13.4 % (ref 11.5–15.5)
WBC: 9.8 10*3/uL (ref 4.0–10.5)
nRBC: 0 % (ref 0.0–0.2)

## 2021-09-23 LAB — GLUCOSE, CAPILLARY
Glucose-Capillary: 142 mg/dL — ABNORMAL HIGH (ref 70–99)
Glucose-Capillary: 172 mg/dL — ABNORMAL HIGH (ref 70–99)

## 2021-09-23 LAB — MAGNESIUM: Magnesium: 2.4 mg/dL (ref 1.7–2.4)

## 2021-09-23 LAB — PROCALCITONIN: Procalcitonin: 0.1 ng/mL

## 2021-09-23 MED ORDER — FE FUMARATE-B12-VIT C-FA-IFC PO CAPS
1.0000 | ORAL_CAPSULE | Freq: Two times a day (BID) | ORAL | Status: DC
Start: 2021-09-23 — End: 2021-11-01

## 2021-09-23 MED ORDER — CARVEDILOL 6.25 MG PO TABS: 6.2500 mg | ORAL_TABLET | Freq: Two times a day (BID) | ORAL | Status: AC

## 2021-09-23 MED ORDER — AMOXICILLIN-POT CLAVULANATE 500-125 MG PO TABS: 1.0000 | ORAL_TABLET | Freq: Two times a day (BID) | ORAL | 0 refills | Status: AC

## 2021-09-23 MED ORDER — DOCUSATE SODIUM 100 MG PO CAPS: 100.0000 mg | ORAL_CAPSULE | Freq: Two times a day (BID) | ORAL | 0 refills | Status: AC

## 2021-09-23 NOTE — TOC Progression Note (Signed)
Transition of Care [facility]) - Progression Note    Patient Details  Name: Phillip Reyes MRN: [id number] Date of Birth: 1940/08/31  Transition of Care [facility]) CM/SW [place], RN Phone Number: 09/23/2021, 9:56 AM  Clinical Narrative:     Patient going to room 703 at [facility] his wife to let her know, left a general VM for a call back, the patient will also call [facility] called and placed on transport list    Expected Discharge Plan: [place] Barriers to Discharge: Continued Medical Work up  Expected Discharge Plan and Services Expected Discharge Plan: [place]   Discharge Planning Services: CM Consult   Living arrangements for the past 2 months: Single Family Home Expected Discharge Date: 09/23/21               DME Arranged: N/A                     Social Determinants of Health (SDOH) Interventions    Readmission Risk Interventions     View : No data to display.

## 2021-09-23 NOTE — Progress Notes (Signed)
   Subjective: 8 Days Post-Op Procedure(s) (LRB): INTRAMEDULLARY (IM) NAIL INTERTROCHANTRIC (Right) Patient reports pain as mild.   Patient is well, and has had no acute complaints or problems Denies any CP, SOB, ABD pain. We will continue therapy today.   Objective: Vital signs in last 24 hours: Temp:  [97.8 F (36.6 C)-99.3 F (37.4 C)] 98.7 F (37.1 C) (05/19 0434) Pulse Rate:  [59-65] 59 (05/19 0434) Resp:  [18-20] 18 (05/19 0434) BP: (135-145)/(48-60) 135/58 (05/19 0434) SpO2:  [93 %-100 %] 93 % (05/19 0434)  Intake/Output from previous day: 05/18 0701 - 05/19 0700 In: 480 [P.O.:480] Out: 950 [Urine:400] Intake/Output this shift: No intake/output data recorded.  Recent Labs    09/21/21 0554 09/22/21 0614 09/23/21 0620  HGB 8.0* 8.2* 7.8*   Recent Labs    09/22/21 0614 09/23/21 0620  WBC 9.2 9.8  RBC 2.68* 2.53*  HCT 25.0* 23.8*  PLT 247 280   Recent Labs    09/22/21 0614 09/23/21 0620  NA 137 137  K 4.1 4.3  CL 103 104  CO2 26 25  BUN 51* 50*  CREATININE 3.55* 3.14*  GLUCOSE 189* 139*  CALCIUM 8.0* 8.2*   No results for input(s): LABPT, INR in the last 72 hours.  EXAM General - Patient is Alert, Appropriate, and Oriented Extremity - Neurovascular intact Sensation intact distally Intact pulses distally Dressing - no drainage, CDI Motor Function - intact, moving foot and toes well on exam.   Past Medical History:  Diagnosis Date   Arthritis    Cancer ([place])    COPD (chronic obstructive pulmonary disease) (HCC)    Diabetes mellitus without complication (HCC)    Hypertension    Renal disorder    Stroke ([place])     Assessment/Plan:   8 Days Post-Op Procedure(s) (LRB): INTRAMEDULLARY (IM) NAIL INTERTROCHANTRIC (Right) Principal Problem:   Other fracture of right femur, initial encounter for closed fracture (HCC) Active Problems:   Type 2 diabetes mellitus (HCC)   HTN (hypertension)   COPD (chronic obstructive pulmonary disease) (HCC)    H/O: stroke   AKI (acute kidney injury) (HCC)   Chronic systolic CHF (congestive heart failure) (HCC)   CKD (chronic kidney disease) stage 4, GFR 15-29 ml/min (HCC)   Hyperkalemia   Dysphagia   Delirium   Acute on chronic anemia   Fever  Estimated body mass index is 22.96 kg/m as calculated from the following:   Height as of this encounter: '5\' 10"'$  (1.778 m).   Weight as of this encounter: 72.6 kg. Advance diet Up with therapy VSS Acute post op blood loss anemia - Hgb 8.2 stable, trending up. s/p 1 unit PRBC. Continue with Fe supplement.   CM to assist with discharge to SNF  Follow up with [facility] in 2 weeks TED hose BLE x 6 weeks Lovenox 40 mg SQ daily X 14 days  DVT Prophylaxis - Lovenox, TED hose, and SCDs Weight-Bearing as tolerated to right leg   T. Rachelle Hora, PA-C [place] 09/23/2021, 7:41 AM

## 2021-09-23 NOTE — Plan of Care (Signed)
Patient discharged per MD orders at this time.All discharge instructions,education and medications reviewed with the patient.Pt expressed understanding and will comply with dc instructions.follow up appointments was also communicated to the patient.no verbal c/o or any ssx of distress.[facility]STR [facility] per order.report was called to staff nurse Monika Salk before transport.patient was transported by two [facility] personnel on a stretcher.

## 2021-09-25 LAB — CULTURE, BLOOD (ROUTINE X 2)
Culture: NO GROWTH
Culture: NO GROWTH
Special Requests: ADEQUATE

## 2021-10-23 ENCOUNTER — Other Ambulatory Visit: Payer: Self-pay

## 2021-10-23 ENCOUNTER — Emergency Department: Payer: No Typology Code available for payment source

## 2021-10-23 ENCOUNTER — Emergency Department
Admission: EM | Admit: 2021-10-23 | Discharge: 2021-10-25 | Disposition: A | Discharge status: Home / Self Care | Payer: No Typology Code available for payment source | Attending: Emergency Medicine | Admitting: Emergency Medicine

## 2021-10-23 DIAGNOSIS — E119 Type 2 diabetes mellitus without complications: Secondary | ICD-10-CM | POA: Insufficient documentation

## 2021-10-23 DIAGNOSIS — M25551 Pain in right hip: Secondary | ICD-10-CM | POA: Insufficient documentation

## 2021-10-23 DIAGNOSIS — I10 Essential (primary) hypertension: Secondary | ICD-10-CM | POA: Diagnosis not present

## 2021-10-23 DIAGNOSIS — J449 Chronic obstructive pulmonary disease, unspecified: Secondary | ICD-10-CM | POA: Diagnosis not present

## 2021-10-23 DIAGNOSIS — W01198A Fall on same level from slipping, tripping and stumbling with subsequent striking against other object, initial encounter: Secondary | ICD-10-CM | POA: Diagnosis not present

## 2021-10-23 DIAGNOSIS — R531 Weakness: Secondary | ICD-10-CM | POA: Diagnosis not present

## 2021-10-23 DIAGNOSIS — M79604 Pain in right leg: Secondary | ICD-10-CM | POA: Diagnosis not present

## 2021-10-23 DIAGNOSIS — W19XXXA Unspecified fall, initial encounter: Secondary | ICD-10-CM

## 2021-10-23 LAB — CBC
HCT: 36.7 % — ABNORMAL LOW (ref 39.0–52.0)
Hemoglobin: 12.2 g/dL — ABNORMAL LOW (ref 13.0–17.0)
MCH: 30.5 pg (ref 26.0–34.0)
MCHC: 33.2 g/dL (ref 30.0–36.0)
MCV: 91.8 fL (ref 80.0–100.0)
Platelets: 276 10*3/uL (ref 150–400)
RBC: 4 MIL/uL — ABNORMAL LOW (ref 4.22–5.81)
RDW: 13.3 % (ref 11.5–15.5)
WBC: 9.7 10*3/uL (ref 4.0–10.5)
nRBC: 0 % (ref 0.0–0.2)

## 2021-10-23 LAB — COMPREHENSIVE METABOLIC PANEL
ALT: 12 U/L (ref 0–44)
AST: 21 U/L (ref 15–41)
Albumin: 3.8 g/dL (ref 3.5–5.0)
Alkaline Phosphatase: 96 U/L (ref 38–126)
Anion gap: 11 (ref 5–15)
BUN: 43 mg/dL — ABNORMAL HIGH (ref 8–23)
CO2: 26 mmol/L (ref 22–32)
Calcium: 9.2 mg/dL (ref 8.9–10.3)
Chloride: 101 mmol/L (ref 98–111)
Creatinine, Ser: 3.56 mg/dL — ABNORMAL HIGH (ref 0.61–1.24)
GFR, Estimated: 17 mL/min — ABNORMAL LOW (ref >60)
Glucose, Bld: 114 mg/dL — ABNORMAL HIGH (ref 70–99)
Potassium: 3.7 mmol/L (ref 3.5–5.1)
Sodium: 138 mmol/L (ref 135–145)
Total Bilirubin: 1 mg/dL (ref 0.3–1.2)
Total Protein: 7.3 g/dL (ref 6.5–8.1)

## 2021-10-23 LAB — CK: Total CK: 37 U/L — ABNORMAL LOW (ref 49–397)

## 2021-10-23 MED ORDER — ONDANSETRON HCL 4 MG/2ML IJ SOLN
4.0000 mg | Freq: Once | INTRAMUSCULAR | Status: AC
  Administered 2021-10-23: 4 mg via INTRAVENOUS
  Filled 2021-10-23: qty 2

## 2021-10-23 MED ORDER — INSULIN ASPART 100 UNIT/ML IJ SOLN
5.0000 [IU] | Freq: Three times a day (TID) | INTRAMUSCULAR | Status: DC
  Administered 2021-10-24 – 2021-10-25 (×3): 5 [IU] via SUBCUTANEOUS
  Filled 2021-10-23 (×4): qty 1

## 2021-10-23 MED ORDER — ATORVASTATIN CALCIUM 20 MG PO TABS
80.0000 mg | ORAL_TABLET | Freq: Every day | ORAL | Status: DC
  Administered 2021-10-23 – 2021-10-24 (×2): 80 mg via ORAL
  Filled 2021-10-23 (×2): qty 4

## 2021-10-23 MED ORDER — SODIUM CHLORIDE 0.9 % IV BOLUS
1000.0000 mL | Freq: Once | INTRAVENOUS | Status: AC
  Administered 2021-10-23: 1000 mL via INTRAVENOUS

## 2021-10-23 MED ORDER — TAMSULOSIN HCL 0.4 MG PO CAPS
0.8000 mg | ORAL_CAPSULE | Freq: Every day | ORAL | Status: DC
  Administered 2021-10-23 – 2021-10-24 (×2): 0.8 mg via ORAL
  Filled 2021-10-23 (×2): qty 2

## 2021-10-23 MED ORDER — DOCUSATE SODIUM 100 MG PO CAPS
100.0000 mg | ORAL_CAPSULE | Freq: Two times a day (BID) | ORAL | Status: DC
  Administered 2021-10-23 – 2021-10-25 (×4): 100 mg via ORAL
  Filled 2021-10-23 (×4): qty 1

## 2021-10-23 MED ORDER — INSULIN GLARGINE-YFGN 100 UNIT/ML ~~LOC~~ SOLN
10.0000 [IU] | Freq: Every day | SUBCUTANEOUS | Status: DC
  Filled 2021-10-23 (×3): qty 0.1

## 2021-10-23 MED ORDER — ASPIRIN 81 MG PO TBEC
81.0000 mg | DELAYED_RELEASE_TABLET | Freq: Every day | ORAL | Status: DC
Start: 2021-10-23 — End: 2021-10-26
  Administered 2021-10-23 – 2021-10-25 (×3): 81 mg via ORAL
  Filled 2021-10-23 (×3): qty 1

## 2021-10-23 MED ORDER — PANTOPRAZOLE SODIUM 40 MG PO TBEC
40.0000 mg | DELAYED_RELEASE_TABLET | Freq: Every day | ORAL | Status: DC
  Administered 2021-10-23 – 2021-10-25 (×3): 40 mg via ORAL
  Filled 2021-10-23 (×3): qty 1

## 2021-10-23 MED ORDER — FENTANYL CITRATE PF 50 MCG/ML IJ SOSY
100.0000 ug | PREFILLED_SYRINGE | Freq: Once | INTRAMUSCULAR | Status: AC
  Administered 2021-10-23: 100 ug via INTRAVENOUS
  Filled 2021-10-23: qty 2

## 2021-10-23 MED ORDER — GABAPENTIN 300 MG PO CAPS
300.0000 mg | ORAL_CAPSULE | Freq: Every day | ORAL | Status: DC
  Administered 2021-10-23 – 2021-10-25 (×3): 300 mg via ORAL
  Filled 2021-10-23 (×3): qty 1

## 2021-10-23 MED ORDER — TRAZODONE HCL 50 MG PO TABS
50.0000 mg | ORAL_TABLET | Freq: Every day | ORAL | Status: DC
Start: 2021-10-23 — End: 2021-10-25
  Administered 2021-10-23 – 2021-10-24 (×2): 50 mg via ORAL
  Filled 2021-10-23 (×2): qty 1

## 2021-10-23 MED ORDER — CARVEDILOL 6.25 MG PO TABS
6.2500 mg | ORAL_TABLET | Freq: Two times a day (BID) | ORAL | Status: DC
  Administered 2021-10-24 – 2021-10-25 (×4): 6.25 mg via ORAL
  Filled 2021-10-23 (×4): qty 1

## 2021-10-23 MED ORDER — FLUTICASONE FUROATE-VILANTEROL 200-25 MCG/ACT IN AEPB
1.0000 | INHALATION_SPRAY | Freq: Every day | RESPIRATORY_TRACT | Status: DC
  Administered 2021-10-24 – 2021-10-25 (×2): 1 via RESPIRATORY_TRACT
  Filled 2021-10-23: qty 28

## 2021-10-23 NOTE — ED Triage Notes (Signed)
Pt was assisted off floor by care giver. Pt fell in the AM of Friday and has been in bed since that time. Pt has been compliant with medications.

## 2021-10-23 NOTE — ED Provider Notes (Signed)
[facility] Provider Note    Event Date/Time   First MD Initiated Contact with Patient 10/23/21 1926     (approximate)  History   Chief Complaint: Fall (Pt fell Friday on left side after having surgery previously on right hip. 20/10 pain)  HPI  Christie Copley is a 81 y.o. male with a past medical history of COPD, diabetes, hypertension, prior CVA, presents to the emergency department for right hip pain and a fall.  According to the patient 2 days ago he fell onto his right side.  3 weeks ago had a right hip/femur surgery with a rod in his femur per patient.  Patient did hit his head.  He was helped into bed by his nurse aide Friday night and has not been able to get out of bed since.  Patient states pain in the right leg with any attempted movement.  Patient denies LOC at the time.  Physical Exam   Triage Vital Signs: ED Triage Vitals  Enc Vitals Group     BP 10/23/21 1926 (!) 129/56     Pulse Rate 10/23/21 1926 60     Resp 10/23/21 1926 18     Temp 10/23/21 1926 98.1 F (36.7 C)     Temp Source 10/23/21 1926 Oral     SpO2 10/23/21 1926 96 %     Weight 10/23/21 1927 154 lb 15.7 oz (70.3 kg)     Height --      Head Circumference --      [facility] --      Pain Score 10/23/21 1927 10     Pain Loc --      Pain Edu? --      Excl. in [place]? --     Most recent vital signs: Vitals:   10/23/21 1926  BP: (!) 129/56  Pulse: 60  Resp: 18  Temp: 98.1 F (36.7 C)  SpO2: 96%    General: Awake, no distress.  CV:  Good peripheral perfusion.  Regular rate and rhythm  Resp:  Normal effort.  Equal breath sounds bilaterally.  Abd:  No distention.  Soft, nontender.  No rebound or guarding. Other:  Moderate tenderness to palpation of the right hip.  Moderate pain with range of motion of the right lower extremity.  Neurovascular intact distally.   ED Results / Procedures / Treatments   EKG  EKG viewed and interpreted by myself shows what appears to be a sinus  rhythm at 60 bpm with a borderline widened QRS, normal axis, largely normal intervals with nonspecific ST changes no ST elevation.  RADIOLOGY  I have interpreted the hip x-ray, hardware appears intact I do not see any obvious fracture of the femur or hip. Radiology has read the x-ray as negative for acute new fracture. CT scan of the head is negative for acute intracranial abnormality   MEDICATIONS ORDERED IN ED: Medications  sodium chloride 0.9 % bolus 1,000 mL (has no administration in time range)     IMPRESSION / MDM / ASSESSMENT AND PLAN / ED COURSE  I reviewed the triage vital signs and the nursing notes.  Patient's presentation is most consistent with acute presentation with potential threat to life or bodily function.  Patient presents to the emergency department after a fall on Friday.  Patient has had right hip pain ever since a fall and has not been able to get out of bed.  Patient had a right hip surgery performed approximate 3 weeks ago.  I reviewed the patient's discharge summary 09/24/2019.  Had an intramedullary femur nail placed on 5/11 by Dr. Rudene Christians.  Patient then went to rehab following his recent discharge.  We will check labs, x-ray of the right hip, CT scan of the head as the patient states he did hit his head when he fell.  Within the labs we will also check a CK.  We will IV hydrate and continue to closely monitor.  Patient's work-up in the emergency department shows no new fractures of the hip.  CT scan head shows no acute findings.  Lab work shows a reassuring CBC, chemistry shows renal insufficiency largely unchanged from historical values.  CK is nonconcerning.  However patient is unable to stand or bear weight.  I do not believe the patient will be safe for discharge home in his current state.  We will have physical therapy and social work evaluate in the morning as the patient may benefit from short-term rehab placement.  Patient agreeable to plan.  FINAL CLINICAL  IMPRESSION(S) / ED DIAGNOSES   Fall Right hip pain  Note:  This document was prepared using Dragon voice recognition software and may include unintentional dictation errors.   Harvest Dark, MD 10/23/21 2023

## 2021-10-24 LAB — CBG MONITORING, ED
Glucose-Capillary: 91 mg/dL (ref 70–99)
Glucose-Capillary: 109 mg/dL — ABNORMAL HIGH (ref 70–99)
Glucose-Capillary: 122 mg/dL — ABNORMAL HIGH (ref 70–99)
Glucose-Capillary: 88 mg/dL (ref 70–99)

## 2021-10-24 NOTE — NC FL2 (Signed)
[place] LEVEL OF CARE SCREENING TOOL     IDENTIFICATION  Patient Name: Phillip Reyes Birthdate: 1940/08/11 Sex: male Admission Date (Current Location): 10/23/2021  [place] and [place] Number:  Engineering geologist and Address:  [facility], [address], [place], [place] [address]      Provider Number: [id number]  Attending Physician Name and Address:  No att. providers found  Relative Name and Phone Number:  Chauncey Sciulli spouse- [phone]    Current Level of Care: Other (Comment) (ED boarder) Recommended Level of Care: [place] Prior Approval Number:    Date Approved/Denied:   PASRR Number: [id number]  Discharge Plan: SNF    Current Diagnoses: Patient Active Problem List   Diagnosis Date Noted   Fever 09/20/2021   Acute on chronic anemia 09/18/2021   Delirium 09/17/2021   Dysphagia 09/16/2021   Other fracture of right femur, initial encounter for closed fracture ([place]) 78/24/2353   Chronic systolic CHF (congestive heart failure) ([place]) 09/14/2021   CKD (chronic kidney disease) stage 4, GFR 15-29 ml/min (HCC) 09/14/2021   Hyperkalemia 09/14/2021   Enterococcus UTI 06/27/2021   Aspiration pneumonia ([place]) 06/27/2021   AKI (acute kidney injury) ([place]) 06/27/2021   Acute kidney injury ([place])    Malnutrition of moderate degree 06/24/2021   Monocytosis    Acute respiratory failure ([place]) 06/22/2021   COPD with acute exacerbation ([place]) 04/16/2020   CAP (community acquired pneumonia) 01/27/2019   Altered mental status 03/09/2016   Acute encephalopathy 06/11/2015   Type 2 diabetes mellitus ([place]) 06/11/2015   HTN (hypertension) 06/11/2015   COPD (chronic obstructive pulmonary disease) ([place]) 06/11/2015   CKD (chronic kidney disease), stage III ([place]) 06/11/2015   H/O: stroke 06/11/2015    Orientation RESPIRATION BLADDER Height & Weight     Self, Situation, Place  Normal Continent Weight: 70.3 kg Height:      BEHAVIORAL SYMPTOMS/MOOD NEUROLOGICAL BOWEL NUTRITION STATUS      Continent Diet (Heart Healthy Carb Modified)  AMBULATORY STATUS COMMUNICATION OF NEEDS Skin   Limited Assist Verbally Normal                       Personal Care Assistance Level of Assistance  Bathing, Feeding, Dressing Bathing Assistance: Limited assistance Feeding assistance: Limited assistance Dressing Assistance: Limited assistance     Functional Limitations Info  Sight, Hearing, Speech Sight Info: Adequate Hearing Info: Adequate Speech Info: Adequate    SPECIAL CARE FACTORS FREQUENCY  PT (By licensed PT), OT (By licensed OT)     PT Frequency: 5 times per week OT Frequency: 5 times per week            Contractures Contractures Info: Not present    Additional Factors Info  Code Status, Allergies Code Status Info: Full Allergies Info: Norco (hydrocodone-acetaminophen)           Current Medications (10/24/2021):  This is the current hospital active medication list Current Facility-Administered Medications  Medication Dose Route Frequency Provider Last Rate Last Admin   aspirin EC tablet 81 mg  81 mg Oral Daily Harvest Dark, MD   81 mg at 10/24/21 0920   atorvastatin (LIPITOR) tablet 80 mg  80 mg Oral QHS Harvest Dark, MD   80 mg at 10/23/21 2126   carvedilol (COREG) tablet 6.25 mg  6.25 mg Oral BID WC Harvest Dark, MD   6.25 mg at 10/24/21 0920   docusate sodium (COLACE) capsule 100 mg  100 mg Oral  BID Harvest Dark, MD   100 mg at 10/24/21 0919   fluticasone furoate-vilanterol (BREO ELLIPTA) 200-25 MCG/ACT 1 puff  1 puff Inhalation Daily Harvest Dark, MD   1 puff at 10/24/21 0919   gabapentin (NEURONTIN) capsule 300 mg  300 mg Oral Daily Harvest Dark, MD   300 mg at 10/24/21 0920   insulin aspart (novoLOG) injection 5 Units  5 Units Subcutaneous TID Janese Banks, MD   5 Units at 10/24/21 0921   insulin glargine-yfgn (SEMGLEE) injection 10 Units  10  Units Subcutaneous QHS Harvest Dark, MD       pantoprazole (PROTONIX) EC tablet 40 mg  40 mg Oral Daily Harvest Dark, MD   40 mg at 10/24/21 0919   tamsulosin (FLOMAX) capsule 0.8 mg  0.8 mg Oral QHS Harvest Dark, MD   0.8 mg at 10/23/21 2125   traZODone (DESYREL) tablet 50 mg  50 mg Oral QHS Harvest Dark, MD   50 mg at 10/23/21 2126   Current Outpatient Medications  Medication Sig Dispense Refill   acetaminophen (TYLENOL) 500 MG tablet Take 2 tablets (1,000 mg total) by mouth 3 (three) times daily as needed for mild pain, moderate pain or headache. 30 tablet 0   aspirin EC 81 MG tablet Take 81 mg by mouth daily.     atorvastatin (LIPITOR) 80 MG tablet Take 80 mg by mouth at bedtime.     azelastine (ASTELIN) 0.1 % nasal spray Place 1 spray into both nostrils 2 (two) times daily as needed for rhinitis.     budesonide-formoterol (SYMBICORT) 160-4.5 MCG/ACT inhaler Inhale 2 puffs into the lungs 2 (two) times daily.     carvedilol (COREG) 6.25 MG tablet Take 1 tablet (6.25 mg total) by mouth 2 (two) times daily with a meal.     Cholecalciferol (VITAMIN D) 50 MCG (2000 UT) CAPS Take 1 capsule by mouth daily. (Patient not taking: Reported on 09/15/2021)     citalopram (CELEXA) 20 MG tablet Take 20 mg by mouth daily.     docusate sodium (COLACE) 100 MG capsule Take 1 capsule (100 mg total) by mouth 2 (two) times daily. 10 capsule 0   enoxaparin (LOVENOX) 40 MG/0.4ML injection Inject 0.4 mLs (40 mg total) into the skin daily for 14 days. 5.6 mL 0   ferrous [id number] C-folic acid (TRINSICON / FOLTRIN) capsule Take 1 capsule by mouth 2 (two) times daily.     gabapentin (NEURONTIN) 300 MG capsule Take 300 mg by mouth daily.      insulin glargine (LANTUS) 100 UNIT/ML injection Inject 10 Units into the skin at bedtime.      insulin regular (NOVOLIN R) 100 units/mL injection Inject 3-10 Units into the skin 3 (three) times daily before meals.     ipratropium-albuterol (DUONEB)  0.5-2.5 (3) MG/3ML SOLN Take 3 mLs by nebulization every 6 (six) hours as needed.     omeprazole (PRILOSEC) 20 MG capsule Take 20 mg by mouth daily. (Patient not taking: Reported on 09/15/2021)     tamsulosin (FLOMAX) 0.4 MG CAPS capsule Take 0.8 mg by mouth at bedtime.     traZODone (DESYREL) 50 MG tablet Take 50 mg by mouth at bedtime.     vitamin B-12 (CYANOCOBALAMIN) 1000 MCG tablet Take 1,000 mcg by mouth daily. (Patient not taking: Reported on 09/15/2021)       Discharge Medications: Please see discharge summary for a list of discharge medications.  Relevant Imaging Results:  Relevant Lab Results:   Additional Information SS #:  [place]  Shelbie Hutching, RN

## 2021-10-24 NOTE — ED Notes (Signed)
Patient is resting comfortably. 

## 2021-10-24 NOTE — Evaluation (Signed)
Physical Therapy Evaluation Patient Details Name: Phillip Reyes MRN: [id number] DOB: 11/23/1940 Today's Date: 10/24/2021  History of Present Illness  Pt is an 81 y.o. male presenting to hospital 6/18.  Pt fell on Friday and c/o R hip pain; unable to get OOB since (pt with recent R hip intramedullary femur nail placement 09/15/21 surgery s/p fall).  Imaging of R hip showing that surgical hardware appears intact and no new acute fx's are visualized.  PMH includes COPD, DM, htn, h/o CVA, AKI, anemia, PVD.  Clinical Impression  Prior to ED visit, pt was modified independent ambulating with RW within home; lives with his wife (who has h/o stroke and uses w/c) in 1 level home with ramp to enter; has PCA that comes in/out during day (9 AM-5:30 PM M-F) assisting him and his wife.  Currently pt is min to mod assist with bed mobility; min assist with transfers; and min assist to ambulate 20 feet with RW use.  Limited distance ambulating d/t R hip pain (pt reports no pain at rest but pain increases with R LE movement/activity).  Pt would benefit from skilled PT to address noted impairments and functional limitations (see below for any additional details).  Upon hospital discharge, pt would benefit from SNF.    Recommendations for follow up therapy are one component of a multi-disciplinary discharge planning process, led by the attending physician.  Recommendations may be updated based on patient status, additional functional criteria and insurance authorization.  Follow Up Recommendations Skilled nursing-short term rehab (<3 hours/day)    Assistance Recommended at Discharge Frequent or constant Supervision/Assistance  Patient can return home with the following  A little help with walking and/or transfers;A little help with bathing/dressing/bathroom;Assistance with cooking/housework;Assist for transportation;Help with stairs or ramp for entrance    Equipment Recommendations  (pt has RW and reports new manual  w/c to be delivered soon)  Recommendations for Other Services  OT consult    Functional Status Assessment Patient has had a recent decline in their functional status and demonstrates the ability to make significant improvements in function in a reasonable and predictable amount of time.     Precautions / Restrictions Precautions Precautions: Fall Restrictions Weight Bearing Restrictions: No Other Position/Activity Restrictions: pt WBAT R LE s/p recent surgery      Mobility  Bed Mobility Overal bed mobility: Needs Assistance Bed Mobility: Supine to Sit, Sit to Supine     Supine to sit: Min assist, Mod assist, HOB elevated Sit to supine: Min assist, Mod assist, HOB elevated   General bed mobility comments: assist for trunk and R LE; vc's for technique; increased time for pt to scoot up in bed (with bed flat) end of session on own with cueing    Transfers Overall transfer level: Needs assistance Equipment used: Rolling walker (2 wheels) Transfers: Sit to/from Stand Sit to Stand: Min assist           General transfer comment: vc's for UE placement; assist to initiate stand and control descent sitting    Ambulation/Gait Ambulation/Gait assistance: Min assist Gait Distance (Feet): 20 Feet Assistive device: Rolling walker (2 wheels)   Gait velocity: decreased     General Gait Details: antalgic; decreased stance time R LE; vc's to increase UE support through RW to offweight R LE during L LE advancement  Stairs            Wheelchair Mobility    Modified Rankin (Stroke Patients Only)       Balance Overall balance  assessment: Needs assistance Sitting-balance support: No upper extremity supported, Feet supported Sitting balance-Leahy Scale: Good Sitting balance - Comments: steady sitting reaching within BOS   Standing balance support: Single extremity supported Standing balance-Leahy Scale: Fair Standing balance comment: steady static standing with at least  single UE support                             Pertinent Vitals/Pain Pain Assessment Pain Assessment: 0-10 Pain Score: 0-No pain (6/10 with activity; 0/10 at rest) Pain Location: R hip Pain Descriptors / Indicators: Aching, Sharp, Shooting, Discomfort, Grimacing, Guarding Pain Intervention(s): Limited activity within patient's tolerance, Monitored during session, Repositioned Vitals (HR and O2 on room air) stable and WFL throughout treatment session.    Home Living Family/patient expects to be discharged to:: Private residence Living Arrangements: Spouse/significant other Available Help at Discharge: Personal care attendant;Available PRN/intermittently Type of Home: House Home Access: Ramped entrance       Home Layout: One level Home Equipment: Cane - single Barista (2 wheels) Additional Comments: pt's wife has a BSC and manual w/c that she regularly uses; pt reports he is getting a new manual w/c for himself that should be delivered to his home soon    Prior Function Prior Level of Function : Needs assist             Mobility Comments: H/o falls.  Pt reports most recently modified independent ambulating with RW within home. ADLs Comments: Pt reports he and his spouse have PCA from 9AM to 5:30 PM (PCA is in and out of home during day to assist) M-F to assist with bathing, medications, meals, dressing, and cleaning.     Hand Dominance        Extremity/Trunk Assessment   Upper Extremity Assessment Upper Extremity Assessment: Overall WFL for tasks assessed    Lower Extremity Assessment Lower Extremity Assessment: RLE deficits/detail (L LE WFL) RLE Deficits / Details: at least 3/5 AROM ankle DF/PF; at least 2+/5 hip flexion; at least 3/5 AROM knee flexion/extension AROM RLE: Unable to fully assess due to pain    Cervical / Trunk Assessment Cervical / Trunk Assessment: Other exceptions Cervical / Trunk Exceptions: forward head/shoulders   Communication   Communication: HOH  Cognition Arousal/Alertness: Awake/alert Behavior During Therapy: WFL for tasks assessed/performed Overall Cognitive Status: Within Functional Limits for tasks assessed                                          General Comments  Pt agreeable to PT session.    Exercises  Transfer and gait training   Assessment/Plan    PT Assessment Patient needs continued PT services  PT Problem List Decreased strength;Decreased activity tolerance;Decreased balance;Decreased mobility;Decreased knowledge of use of DME;Decreased knowledge of precautions;Pain       PT Treatment Interventions DME instruction;Gait training;Functional mobility training;Therapeutic activities;Therapeutic exercise;Balance training;Patient/family education    PT Goals (Current goals can be found in the Care Plan section)  Acute Rehab PT Goals Patient Stated Goal: to improve walking PT Goal Formulation: With patient Time For Goal Achievement: 11/07/21 Potential to Achieve Goals: Good    Frequency Min 2X/week     Co-evaluation               AM-PAC PT "6 Clicks" Mobility  Outcome Measure Help needed turning from your back to your  side while in a flat bed without using bedrails?: A Little Help needed moving from lying on your back to sitting on the side of a flat bed without using bedrails?: A Lot Help needed moving to and from a bed to a chair (including a wheelchair)?: A Lot Help needed standing up from a chair using your arms (e.g., wheelchair or bedside chair)?: A Lot Help needed to walk in hospital room?: A Little Help needed climbing 3-5 steps with a railing? : Total 6 Click Score: 13    End of Session Equipment Utilized During Treatment: Gait belt Activity Tolerance: Patient limited by pain Patient left: in bed;with call bell/phone within reach;with bed alarm set   PT Visit Diagnosis: Unsteadiness on feet (R26.81);Muscle weakness (generalized)  (M62.81);History of falling (Z91.81);Other abnormalities of gait and mobility (R26.89);Pain Pain - Right/Left: Right Pain - part of body: Hip    Time: 1023-1106 PT Time Calculation (min) (ACUTE ONLY): 43 min   Charges:   PT Evaluation $PT Eval Low Complexity: 1 Low PT Treatments $Gait Training: 8-22 mins $Therapeutic Activity: 8-22 mins       Leitha Bleak, PT 10/24/21, 11:42 AM

## 2021-10-24 NOTE — ED Provider Notes (Signed)
-----------------------------------------   5:03 AM on 10/24/2021 -----------------------------------------   Blood pressure (!) 102/53, pulse 60, temperature 98.1 F (36.7 C), temperature source Oral, resp. rate 14, weight 70.3 kg, SpO2 100 %.  The patient is calm and cooperative at this time.  There have been no acute events since the last update.  Awaiting disposition plan from Social Work team.   Paulette Blanch, MD 10/24/21 [phone]

## 2021-10-24 NOTE — ED Notes (Signed)
Report received from [place], RN 

## 2021-10-24 NOTE — ED Notes (Signed)
Patient is asleep.  

## 2021-10-24 NOTE — ED Notes (Signed)
Patient called his wife and while rounding, he appears distressed. When writer asked pt what was wrong, he states that he was on the phone with his wife and she stopped talking and that he is concerned that she may have fallen. Advised pt that Probation officer would call communications to arrange a wellness check. Called communications and spoke with dispatch. An ambulance, police and fire were dispatched to pt's home. Writer was told by dispatch that she would receive a call back on the status of pt's wife. Updated pt and told him that I would let him know when I heard something back. Approx 20 minutes later, pt's caregiver called back and stated that the pt's wife was ok and that she had just dropped the phone. Writer then took the phone to the pt and he spoke with the pt to let him know his wife was ok. The caregiver informed Probation officer that the police, ambulance, and fire department had already left the home. Dispatch never called back to speak with Probation officer.

## 2021-10-24 NOTE — TOC Initial Note (Signed)
Transition of Care [facility]) - Initial/Assessment Note    Patient Details  Name: Phillip Reyes MRN: [id number] Date of Birth: [birth year]  [facility] of [facility]) CM/SW Contact:    Shelbie Hutching, RN Phone Number: 10/24/2021, 12:16 PM  Clinical Narrative:                 Patient brought into the hospital with weakness after falling last week.  Patient does not need to be admitted to the hospital, PT is recommending SNF.  Patient agrees to SNF- [facility] and agrees to [facility] back to [facility] if [facility] will offer a bed.  [facility] went to [facility] under [facility] Medicare.  Patient is a [facility] patient.  He lives at home with his wife, wife is also disabled from a previous stroke and is in a wheelchair.  Patient has a caregiver that comes out M-F 8-5 and helps he and his wife with dressing, cooking, and transportation.   Patient is usually able to get around with a walker but after falling on Friday has not been able to get out of the bed.  Patient agrees to go back to rehab and is good with [facility] if they can offer a bed.    SNF workup started.   Expected Discharge Plan: Skilled Nursing Facility Barriers to Discharge: SNF Pending bed offer   Patient Goals and CMS Choice Patient states their goals for this hospitalization and ongoing recovery are:: Patient agrees to go to SNF for short term rehab CMS Medicare.gov Compare Post Acute Care list provided to:: Patient Choice offered to / list presented to : Patient  Expected Discharge Plan and Services Expected Discharge Plan: [place]   Discharge Planning Services: CM Consult Post Acute Care Choice: [place] arrangements for the past 2 months: Single Family Home                 DME Arranged: N/A DME Agency: NA       HH Arranged: NA HH Agency: NA        Prior Living Arrangements/Services Living arrangements for the past 2 months: Single Family Home Lives with:: Spouse Patient language and need  for interpreter reviewed:: Yes Do you feel safe going back to the place where you live?: Yes      Need for Family Participation in Patient Care: Yes (Comment) Care giver support system in place?: Yes (comment) (wife) Current home services: DME, Homehealth aide (walkers, canes, crutches) Criminal Activity/Legal Involvement Pertinent to Current Situation/Hospitalization: No - Comment as needed  Activities of Daily Living      Permission Sought/Granted Permission sought to share information with : Case Manager, Family Supports, Customer service manager Permission granted to share information with : Yes, Verbal Permission Granted  Share Information with NAME: Kaylum Shrum  Permission granted to share info w AGENCY: SNF's  Permission granted to share info w Relationship: spouse  Permission granted to share info w Contact Information: [phone]  Emotional Assessment Appearance:: Appears stated age Attitude/Demeanor/Rapport: Engaged Affect (typically observed): Accepting Orientation: : Oriented to Self, Oriented to Place, Oriented to Situation Alcohol / Substance Use: Not Applicable Psych Involvement: No (comment)  Admission diagnosis:  Fall-EMS Patient Active Problem List   Diagnosis Date Noted   Fever 09/20/2021   Acute on chronic anemia 09/18/2021   Delirium 09/17/2021   Dysphagia 09/16/2021   Other fracture of right femur, initial encounter for closed fracture ([place]) 73/41/9379   Chronic systolic CHF (congestive  heart failure) ([place]) 09/14/2021   CKD (chronic kidney disease) stage 4, GFR 15-29 ml/min (HCC) 09/14/2021   Hyperkalemia 09/14/2021   Enterococcus UTI 06/27/2021   Aspiration pneumonia ([place]) 06/27/2021   AKI (acute kidney injury) ([place]) 06/27/2021   Acute kidney injury ([place])    Malnutrition of moderate degree 06/24/2021   Monocytosis    Acute respiratory failure ([place]) 06/22/2021   COPD with acute exacerbation ([place]) 04/16/2020   CAP (community acquired  pneumonia) 01/27/2019   Altered mental status 03/09/2016   Acute encephalopathy 06/11/2015   Type 2 diabetes mellitus ([place]) 06/11/2015   HTN (hypertension) 06/11/2015   COPD (chronic obstructive pulmonary disease) ([place]) 06/11/2015   CKD (chronic kidney disease), stage III ([place]) 06/11/2015   H/O: stroke 06/11/2015   PCP:  [facility], [place]:   [facility] [place], [place] - [address] [place] [address] Phone: [phone] Fax: [place], [place] [address] Phone: [phone] Fax: [phone]  [id number] [place] [address] [id number]: [phone]: [phone]    Social Determinants of Health (SDOH) Interventions    Readmission Risk Interventions     No data to display

## 2021-10-24 NOTE — ED Notes (Addendum)
Monitor removed from patient per request, no order for continuous monitoring. Pt is a boarder waiting on rehabilitation placement.

## 2021-10-24 NOTE — ED Notes (Signed)
Patient provided a sandwich box and a milk.

## 2021-10-25 LAB — CBG MONITORING, ED
Glucose-Capillary: 154 mg/dL — ABNORMAL HIGH (ref 70–99)
Glucose-Capillary: 106 mg/dL — ABNORMAL HIGH (ref 70–99)
Glucose-Capillary: 164 mg/dL — ABNORMAL HIGH (ref 70–99)

## 2021-10-25 MED ORDER — TRAZODONE HCL 50 MG PO TABS
50.0000 mg | ORAL_TABLET | Freq: Every day | ORAL | Status: DC
  Administered 2021-10-25: 50 mg via ORAL
  Filled 2021-10-25: qty 1

## 2021-10-25 NOTE — ED Provider Notes (Signed)
-----------------------------------------   6:41 AM on 10/25/2021 -----------------------------------------   Blood pressure (!) 128/56, pulse 60, temperature 98.1 F (36.7 C), temperature source Oral, resp. rate 17, weight 70.3 kg, SpO2 100 %.  The patient is calm and cooperative at this time.  Patient had an extra trazodone to help him sleep; it was successful.  Awaiting disposition plan from Social Work team.   Paulette Blanch, MD 10/25/21 [phone]

## 2021-10-25 NOTE — ED Notes (Signed)
Pt is sleeping, respirations are even and unlabored. No acute distress noted.

## 2021-10-25 NOTE — ED Notes (Signed)
Called pt's caregiver, [employer], to see what his ETA is. No answer and voicemailbox is full.

## 2021-10-25 NOTE — ED Notes (Signed)
Patient requesting something else to help him sleep. Dr. Beather Arbour aware. Awaiting new orders

## 2021-10-25 NOTE — TOC Transition Note (Signed)
Transition of Care [facility]) - CM/SW Discharge Note   Patient Details  Name: Phillip Reyes MRN: [id number] Date of Birth: 09/15/1940  Transition of Care [facility]) CM/SW Contact:  Shelbie Hutching, RN Phone Number: 10/25/2021, 4:55 PM   Clinical Narrative:     Patient did much better this afternoon with PT.  PT reports that patient could go home with home health as long as he has supervision while ambulating.  Patient reports that he will not get up by himself.  His caregiver Charlies Constable will be able to pick him up this afternoon and will be able to stay with the patient and his wife tonight.  Patient has a walker at home, PT recommends ordering a 3 in1- 3 in 1 ordered from [employer].  Family can pick up tomorrow from [facility] in [address] Patient is open with [facility] for [facility] health services.  [place] with [place] [facility] notified of discharge today.   Daughter called and updated on discharge plans.    Final next level of care: [place] Barriers to Discharge: Barriers Resolved   Patient Goals and CMS Choice Patient states their goals for this hospitalization and ongoing recovery are:: Patient wants to go home CMS Medicare.gov Compare Post Acute Care list provided to:: Patient Choice offered to / list presented to : Patient  Discharge Placement                       Discharge Plan and Services   Discharge Planning Services: CM Consult Post Acute Care Choice: [place]          DME Arranged: 3-N-1 DME Agency: [employer] Date DME Agency Contacted: 10/25/21 Time DME Agency Contacted: [phone] Representative spoke with at DME Agency: Suanne Marker HH Arranged: RN, PT, OT, Social Work [employer] Agency: [place] Date [place]: 10/25/21 Time [place]: [place] Representative spoke with at [place]: [place]  Social Determinants of Health ([place]) Interventions     Readmission Risk Interventions     No data to  display

## 2021-10-25 NOTE — ED Provider Notes (Signed)
My assessment of this patient who is currently boarding emergency room pending possible placement for some acute on chronic pain and weakness after mechanical fall couple days ago states he is feeling much better.  He is now able to ambulate emergency room.  He wishes to go home.  Discharge instructions to follow-up at the [place].  Home health orders placed.   Lucrezia Starch, MD 10/25/21 [phone]

## 2021-10-25 NOTE — Discharge Instructions (Addendum)
3 in 1 bedside commode has been ordered.  Please pick up the bedside commode tomorrow over at [facility] in [place] at [address] [place] [address].  Telephone number [phone].

## 2021-10-25 NOTE — Progress Notes (Signed)
Physical Therapy Treatment Patient Details Name: Phillip Reyes MRN: [id number] DOB: August 16, 1940 Today's Date: 10/25/2021   History of Present Illness Pt is an 81 y.o. male presenting to hospital 6/18.  Pt fell on Friday and c/o R hip pain; unable to get OOB since (pt with recent R hip intramedullary femur nail placement 09/15/21 surgery s/p fall).  Imaging of R hip showing that surgical hardware appears intact and no new acute fx's are visualized.  PMH includes COPD, DM, htn, h/o CVA, AKI, anemia, PVD.    PT Comments    Pt resting in ED stretcher bed upon PT arrival and eager to get OOB and walk.  During session pt min to mod assist with bed mobility, CGA with transfers using RW, and CGA to ambulate 60 feet with RW (limited distance ambulating d/t R hip pain and fatigue; antalgic gait with decreased cadence noted).  Pt steady without any loss of balance during sessions activities.  Discussed with pt need for 24/7 assist with functional mobility for safety (to prevent falls) and pt reports he will have someone available (PCA M-F 9AM to 5 PM intermittently) or that he can call whenever he is getting up/mobilizing (and can use urinal in sitting for toileting).  Discussed pt's current status with TOC who came to talk with pt regarding discharging planning.  Pt preferring to go home with assist at this time.   Recommendations for follow up therapy are one component of a multi-disciplinary discharge planning process, led by the attending physician.  Recommendations may be updated based on patient status, additional functional criteria and insurance authorization.  Follow Up Recommendations  Skilled nursing-short term rehab (<3 hours/day) (may be able to go home with 24/7 assist for functional mobility and HHPT) Can patient physically be transported by private vehicle: Yes   Assistance Recommended at Discharge Frequent or constant Supervision/Assistance  Patient can return home with the following A little  help with walking and/or transfers;A little help with bathing/dressing/bathroom;Assistance with cooking/housework;Assist for transportation;Help with stairs or ramp for entrance   Equipment Recommendations  BSC/3in1 (pt reports having RW and will have manual w/c delivered soon)    Recommendations for Other Services OT consult     Precautions / Restrictions Precautions Precautions: Fall Restrictions Weight Bearing Restrictions: No Other Position/Activity Restrictions: pt WBAT R LE s/p recent surgery     Mobility  Bed Mobility Overal bed mobility: Needs Assistance Bed Mobility: Supine to Sit, Sit to Supine     Supine to sit: Mod assist, HOB elevated (assist for trunk; vc's for technique) Sit to supine: Min assist (assist for R>L LE's)        Transfers Overall transfer level: Needs assistance Equipment used: Rolling walker (2 wheels) Transfers: Sit to/from Stand Sit to Stand: Min guard           General transfer comment: x1 trial from ED stretcher bed and x1 trial from chair    Ambulation/Gait Ambulation/Gait assistance: Min guard Gait Distance (Feet): 60 Feet Assistive device: Rolling walker (2 wheels)   Gait velocity: decreased     General Gait Details: antalgic; decreased stance time R LE; initial vc's to increase UE support through RW to offweight R LE during L LE advancement; steady with RW use   Stairs Stairs:  (pt has ramp to enter home)           Wheelchair Mobility    Modified Rankin (Stroke Patients Only)       Balance Overall balance assessment: Needs assistance Sitting-balance support:  No upper extremity supported, Feet supported Sitting balance-Leahy Scale: Good Sitting balance - Comments: steady sitting reaching within BOS   Standing balance support: Single extremity supported Standing balance-Leahy Scale: Fair Standing balance comment: steady static standing with at least single UE support                             Cognition Arousal/Alertness: Awake/alert Behavior During Therapy: WFL for tasks assessed/performed Overall Cognitive Status: Within Functional Limits for tasks assessed                                          Exercises      General Comments        Pertinent Vitals/Pain Pain Assessment Pain Assessment: Faces Faces Pain Scale: No hurt Pain Location: R hip Pain Descriptors / Indicators: Aching, Sore, Tender Pain Intervention(s): Limited activity within patient's tolerance, Monitored during session, Repositioned Vitals (HR and O2 on room air) stable and WFL throughout treatment session.    Home Living                          Prior Function            PT Goals (current goals can now be found in the care plan section) Acute Rehab PT Goals Patient Stated Goal: to improve walking PT Goal Formulation: With patient Time For Goal Achievement: 11/07/21 Potential to Achieve Goals: Good Progress towards PT goals: Progressing toward goals    Frequency    Min 2X/week      PT Plan Discharge plan needs to be updated (Discussed with TOC)    Co-evaluation              AM-PAC PT "6 Clicks" Mobility   Outcome Measure  Help needed turning from your back to your side while in a flat bed without using bedrails?: A Little Help needed moving from lying on your back to sitting on the side of a flat bed without using bedrails?: A Lot Help needed moving to and from a bed to a chair (including a wheelchair)?: A Little Help needed standing up from a chair using your arms (e.g., wheelchair or bedside chair)?: A Little Help needed to walk in hospital room?: A Little Help needed climbing 3-5 steps with a railing? : Total 6 Click Score: 15    End of Session Equipment Utilized During Treatment: Gait belt Activity Tolerance: Patient limited by pain Patient left: in bed;with call bell/phone within reach;with bed alarm set;Other (comment) (B heels floating  via pillow support) Nurse Communication: Mobility status;Precautions PT Visit Diagnosis: Unsteadiness on feet (R26.81);Muscle weakness (generalized) (M62.81);History of falling (Z91.81);Other abnormalities of gait and mobility (R26.89);Pain Pain - Right/Left: Right Pain - part of body: Hip     Time: 6789-3810 PT Time Calculation (min) (ACUTE ONLY): 49 min  Charges:  $Gait Training: 8-22 mins $Therapeutic Activity: 23-37 mins                     Leitha Bleak, PT 10/25/21, 5:26 PM

## 2021-10-25 NOTE — TOC Progression Note (Signed)
Transition of Care [facility]) - Progression Note    Patient Details  Name: Phillip Reyes MRN: [id number] Date of Birth: August 29, 1940  [facility]) CM/SW Contact  Shelbie Hutching, RN Phone Number: 10/25/2021, 1:29 PM  Clinical Narrative:    [facility] can offer a bed but patient is in his copay days with Medicare.  Patient reports that he cannot afford to pay $197/ day , 7 days up front.  Patient has VA benefits and he could use these instead of his Medicare but he will need to go to a [place] approved facility.  [facility] is not in network with the [place].  Patient agrees to search for [place] facility and have the [place] cover SNF benefits.   RNCM will submit required documentation to the [place] for them to start authorization.     Expected Discharge Plan: Skilled Nursing Facility Barriers to Discharge: SNF Pending bed offer  Expected Discharge Plan and Services Expected Discharge Plan: [place]   Discharge Planning Services: CM Consult Post Acute Care Choice: [place] Living arrangements for the past 2 months: Single Family Home                 DME Arranged: N/A DME Agency: NA       HH Arranged: NA HH Agency: NA         Social Determinants of Health (SDOH) Interventions    Readmission Risk Interventions     No data to display

## 2021-10-25 NOTE — ED Notes (Signed)
Assisted pt with the urinal and repositioning. No further needs at this time. NAD noted

## 2021-10-27 ENCOUNTER — Emergency Department: Payer: No Typology Code available for payment source

## 2021-10-27 ENCOUNTER — Other Ambulatory Visit: Payer: Self-pay

## 2021-10-27 ENCOUNTER — Inpatient Hospital Stay
Admission: EM | Admit: 2021-10-27 | Discharge: 2021-11-01 | DRG: 556 | Disposition: A | Discharge status: Skilled Nursing Facility | Payer: No Typology Code available for payment source | Attending: Obstetrics and Gynecology | Admitting: Obstetrics and Gynecology

## 2021-10-27 DIAGNOSIS — F17210 Nicotine dependence, cigarettes, uncomplicated: Secondary | ICD-10-CM | POA: Diagnosis present

## 2021-10-27 DIAGNOSIS — Z8249 Family history of ischemic heart disease and other diseases of the circulatory system: Secondary | ICD-10-CM | POA: Diagnosis not present

## 2021-10-27 DIAGNOSIS — R54 Age-related physical debility: Secondary | ICD-10-CM | POA: Diagnosis present

## 2021-10-27 DIAGNOSIS — E119 Type 2 diabetes mellitus without complications: Secondary | ICD-10-CM

## 2021-10-27 DIAGNOSIS — Z8781 Personal history of (healed) traumatic fracture: Secondary | ICD-10-CM

## 2021-10-27 DIAGNOSIS — R531 Weakness: Secondary | ICD-10-CM | POA: Diagnosis not present

## 2021-10-27 DIAGNOSIS — Y929 Unspecified place or not applicable: Secondary | ICD-10-CM

## 2021-10-27 DIAGNOSIS — F411 Generalized anxiety disorder: Secondary | ICD-10-CM | POA: Diagnosis present

## 2021-10-27 DIAGNOSIS — D509 Iron deficiency anemia, unspecified: Secondary | ICD-10-CM | POA: Diagnosis present

## 2021-10-27 DIAGNOSIS — Z79899 Other long term (current) drug therapy: Secondary | ICD-10-CM

## 2021-10-27 DIAGNOSIS — I5022 Chronic systolic (congestive) heart failure: Secondary | ICD-10-CM | POA: Diagnosis present

## 2021-10-27 DIAGNOSIS — I69351 Hemiplegia and hemiparesis following cerebral infarction affecting right dominant side: Secondary | ICD-10-CM | POA: Diagnosis not present

## 2021-10-27 DIAGNOSIS — Z885 Allergy status to narcotic agent status: Secondary | ICD-10-CM

## 2021-10-27 DIAGNOSIS — Z7951 Long term (current) use of inhaled steroids: Secondary | ICD-10-CM

## 2021-10-27 DIAGNOSIS — Z794 Long term (current) use of insulin: Secondary | ICD-10-CM

## 2021-10-27 DIAGNOSIS — M25551 Pain in right hip: Secondary | ICD-10-CM | POA: Diagnosis not present

## 2021-10-27 DIAGNOSIS — W2201XA Walked into wall, initial encounter: Secondary | ICD-10-CM | POA: Diagnosis present

## 2021-10-27 DIAGNOSIS — E1122 Type 2 diabetes mellitus with diabetic chronic kidney disease: Secondary | ICD-10-CM | POA: Diagnosis present

## 2021-10-27 DIAGNOSIS — D649 Anemia, unspecified: Secondary | ICD-10-CM | POA: Diagnosis present

## 2021-10-27 DIAGNOSIS — J449 Chronic obstructive pulmonary disease, unspecified: Secondary | ICD-10-CM | POA: Diagnosis present

## 2021-10-27 DIAGNOSIS — I130 Hypertensive heart and chronic kidney disease with heart failure and stage 1 through stage 4 chronic kidney disease, or unspecified chronic kidney disease: Secondary | ICD-10-CM | POA: Diagnosis present

## 2021-10-27 DIAGNOSIS — I69391 Dysphagia following cerebral infarction: Secondary | ICD-10-CM

## 2021-10-27 DIAGNOSIS — Z20822 Contact with and (suspected) exposure to covid-19: Secondary | ICD-10-CM | POA: Diagnosis present

## 2021-10-27 DIAGNOSIS — R296 Repeated falls: Secondary | ICD-10-CM

## 2021-10-27 DIAGNOSIS — Z7982 Long term (current) use of aspirin: Secondary | ICD-10-CM

## 2021-10-27 DIAGNOSIS — N184 Chronic kidney disease, stage 4 (severe): Secondary | ICD-10-CM | POA: Diagnosis present

## 2021-10-27 DIAGNOSIS — G8929 Other chronic pain: Secondary | ICD-10-CM | POA: Diagnosis present

## 2021-10-27 DIAGNOSIS — N179 Acute kidney failure, unspecified: Principal | ICD-10-CM

## 2021-10-27 DIAGNOSIS — Z8673 Personal history of transient ischemic attack (TIA), and cerebral infarction without residual deficits: Secondary | ICD-10-CM

## 2021-10-27 DIAGNOSIS — I10 Essential (primary) hypertension: Secondary | ICD-10-CM | POA: Diagnosis present

## 2021-10-27 DIAGNOSIS — Z789 Other specified health status: Secondary | ICD-10-CM

## 2021-10-27 LAB — COMPREHENSIVE METABOLIC PANEL
ALT: 11 U/L (ref 0–44)
AST: 13 U/L — ABNORMAL LOW (ref 15–41)
Albumin: 3.4 g/dL — ABNORMAL LOW (ref 3.5–5.0)
Alkaline Phosphatase: 91 U/L (ref 38–126)
Anion gap: 9 (ref 5–15)
BUN: 58 mg/dL — ABNORMAL HIGH (ref 8–23)
CO2: 23 mmol/L (ref 22–32)
Calcium: 9 mg/dL (ref 8.9–10.3)
Chloride: 106 mmol/L (ref 98–111)
Creatinine, Ser: 3.92 mg/dL — ABNORMAL HIGH (ref 0.61–1.24)
GFR, Estimated: 15 mL/min — ABNORMAL LOW (ref >60)
Glucose, Bld: 122 mg/dL — ABNORMAL HIGH (ref 70–99)
Potassium: 4.2 mmol/L (ref 3.5–5.1)
Sodium: 138 mmol/L (ref 135–145)
Total Bilirubin: 0.4 mg/dL (ref 0.3–1.2)
Total Protein: 6.4 g/dL — ABNORMAL LOW (ref 6.5–8.1)

## 2021-10-27 LAB — GLUCOSE, CAPILLARY: Glucose-Capillary: 131 mg/dL — ABNORMAL HIGH (ref 70–99)

## 2021-10-27 LAB — CBC WITH DIFFERENTIAL/PLATELET
Abs Immature Granulocytes: 0.03 10*3/uL (ref 0.00–0.07)
Basophils Absolute: 0.1 10*3/uL (ref 0.0–0.1)
Basophils Relative: 1 %
Eosinophils Absolute: 0.3 10*3/uL (ref 0.0–0.5)
Eosinophils Relative: 3 %
HCT: 31 % — ABNORMAL LOW (ref 39.0–52.0)
Hemoglobin: 10.5 g/dL — ABNORMAL LOW (ref 13.0–17.0)
Immature Granulocytes: 0 %
Lymphocytes Relative: 12 %
Lymphs Abs: 1.2 10*3/uL (ref 0.7–4.0)
MCH: 30.4 pg (ref 26.0–34.0)
MCHC: 33.9 g/dL (ref 30.0–36.0)
MCV: 89.9 fL (ref 80.0–100.0)
Monocytes Absolute: 1.1 10*3/uL — ABNORMAL HIGH (ref 0.1–1.0)
Monocytes Relative: 10 %
Neutro Abs: 7.8 10*3/uL — ABNORMAL HIGH (ref 1.7–7.7)
Neutrophils Relative %: 74 %
Platelets: 265 10*3/uL (ref 150–400)
RBC: 3.45 MIL/uL — ABNORMAL LOW (ref 4.22–5.81)
RDW: 13.5 % (ref 11.5–15.5)
WBC: 10.6 10*3/uL — ABNORMAL HIGH (ref 4.0–10.5)
nRBC: 0 % (ref 0.0–0.2)

## 2021-10-27 LAB — TROPONIN I (HIGH SENSITIVITY)
Troponin I (High Sensitivity): 10 ng/L (ref <18)
Troponin I (High Sensitivity): 11 ng/L (ref <18)

## 2021-10-27 MED ORDER — INSULIN GLARGINE-YFGN 100 UNIT/ML ~~LOC~~ SOLN
10.0000 [IU] | Freq: Every day | SUBCUTANEOUS | Status: DC
  Administered 2021-10-28 – 2021-10-31 (×4): 10 [IU] via SUBCUTANEOUS
  Filled 2021-10-27 (×5): qty 0.1

## 2021-10-27 MED ORDER — LACTATED RINGERS IV BOLUS
1000.0000 mL | Freq: Once | INTRAVENOUS | Status: AC
  Administered 2021-10-27: 1000 mL via INTRAVENOUS

## 2021-10-27 MED ORDER — ONDANSETRON HCL 4 MG/2ML IJ SOLN: 4.0000 mg | Freq: Four times a day (QID) | INTRAMUSCULAR | Status: DC | PRN

## 2021-10-27 MED ORDER — ACETAMINOPHEN 325 MG PO TABS
650.0000 mg | ORAL_TABLET | Freq: Four times a day (QID) | ORAL | Status: DC | PRN
  Administered 2021-10-28 – 2021-10-29 (×2): 650 mg via ORAL
  Filled 2021-10-27 (×2): qty 2

## 2021-10-27 MED ORDER — ENOXAPARIN SODIUM 30 MG/0.3ML IJ SOSY
30.0000 mg | PREFILLED_SYRINGE | INTRAMUSCULAR | Status: DC
  Administered 2021-10-27: 30 mg via SUBCUTANEOUS
  Filled 2021-10-27: qty 0.3

## 2021-10-27 MED ORDER — PANTOPRAZOLE SODIUM 40 MG PO TBEC: 40.0000 mg | DELAYED_RELEASE_TABLET | Freq: Every day | ORAL | Status: DC

## 2021-10-27 MED ORDER — TAMSULOSIN HCL 0.4 MG PO CAPS
0.8000 mg | ORAL_CAPSULE | Freq: Every day | ORAL | Status: DC
  Administered 2021-10-27 – 2021-10-31 (×5): 0.8 mg via ORAL
  Filled 2021-10-27 (×5): qty 2

## 2021-10-27 MED ORDER — ATORVASTATIN CALCIUM 20 MG PO TABS
80.0000 mg | ORAL_TABLET | Freq: Every day | ORAL | Status: DC
  Administered 2021-10-27 – 2021-10-31 (×5): 80 mg via ORAL
  Filled 2021-10-27 (×5): qty 4

## 2021-10-27 MED ORDER — ONDANSETRON HCL 4 MG PO TABS: 4.0000 mg | ORAL_TABLET | Freq: Four times a day (QID) | ORAL | Status: DC | PRN

## 2021-10-27 MED ORDER — IPRATROPIUM-ALBUTEROL 0.5-2.5 (3) MG/3ML IN SOLN
3.0000 mL | Freq: Four times a day (QID) | RESPIRATORY_TRACT | Status: DC | PRN
Start: 2021-10-27 — End: 2021-11-01

## 2021-10-27 MED ORDER — TRAZODONE HCL 50 MG PO TABS
50.0000 mg | ORAL_TABLET | Freq: Every day | ORAL | Status: DC
  Administered 2021-10-27 – 2021-10-31 (×5): 50 mg via ORAL
  Filled 2021-10-27 (×5): qty 1

## 2021-10-27 MED ORDER — ACETAMINOPHEN 650 MG RE SUPP: 650.0000 mg | Freq: Four times a day (QID) | RECTAL | Status: DC | PRN

## 2021-10-27 MED ORDER — INSULIN ASPART 100 UNIT/ML IJ SOLN
0.0000 [IU] | Freq: Every day | INTRAMUSCULAR | Status: DC
  Administered 2021-10-31: 2 [IU] via SUBCUTANEOUS
  Filled 2021-10-27: qty 1

## 2021-10-27 MED ORDER — CARVEDILOL 6.25 MG PO TABS
6.2500 mg | ORAL_TABLET | Freq: Two times a day (BID) | ORAL | Status: DC
  Administered 2021-10-28 – 2021-11-01 (×9): 6.25 mg via ORAL
  Filled 2021-10-27 (×9): qty 1

## 2021-10-27 MED ORDER — INSULIN ASPART 100 UNIT/ML IJ SOLN
0.0000 [IU] | Freq: Three times a day (TID) | INTRAMUSCULAR | Status: DC
  Administered 2021-10-28 – 2021-10-29 (×3): 1 [IU] via SUBCUTANEOUS
  Administered 2021-10-30: 2 [IU] via SUBCUTANEOUS
  Administered 2021-10-31: 1 [IU] via SUBCUTANEOUS
  Administered 2021-11-01: 2 [IU] via SUBCUTANEOUS
  Filled 2021-10-27 (×6): qty 1

## 2021-10-27 MED ORDER — CITALOPRAM HYDROBROMIDE 20 MG PO TABS
20.0000 mg | ORAL_TABLET | Freq: Every day | ORAL | Status: DC
  Administered 2021-10-28 – 2021-11-01 (×5): 20 mg via ORAL
  Filled 2021-10-27 (×5): qty 1

## 2021-10-27 MED ORDER — SODIUM CHLORIDE 0.9 % IV SOLN
INTRAVENOUS | Status: AC
Start: 2021-10-27 — End: 2021-10-28

## 2021-10-27 MED ORDER — MOMETASONE FURO-FORMOTEROL FUM 200-5 MCG/ACT IN AERO
2.0000 | INHALATION_SPRAY | Freq: Two times a day (BID) | RESPIRATORY_TRACT | Status: DC
  Administered 2021-10-28 – 2021-11-01 (×9): 2 via RESPIRATORY_TRACT
  Filled 2021-10-27: qty 8.8

## 2021-10-27 MED ORDER — ASPIRIN 81 MG PO TBEC
81.0000 mg | DELAYED_RELEASE_TABLET | Freq: Every day | ORAL | Status: DC
  Administered 2021-10-28 – 2021-11-01 (×5): 81 mg via ORAL
  Filled 2021-10-27 (×5): qty 1

## 2021-10-27 NOTE — ED Notes (Signed)
Attempted for 22g IV at R ac.

## 2021-10-27 NOTE — ED Triage Notes (Signed)
Pt comes into the ED via EMS from home , fell 4 days ago having pain from right knee up to the hip, was seen and discharged from here. Pt had hip fx repair on the same hip 5/11  CBG252 139/61 HR60 100%RA

## 2021-10-27 NOTE — ED Notes (Signed)
EKG to EDP [place] in person.  

## 2021-10-27 NOTE — Assessment & Plan Note (Signed)
Blood pressure controlled.  Continue carvedilol 

## 2021-10-27 NOTE — Assessment & Plan Note (Signed)
Continue home inhalers DuoNebs as needed 

## 2021-10-27 NOTE — ED Notes (Signed)
Contacted phlebotomy in lab to assist with collection of blood since medic attempted for IV placement as well as this RN.

## 2021-10-27 NOTE — ED Triage Notes (Signed)
Pt states right leg pain increasing since fall a few days ago. Seen here for same.

## 2021-10-27 NOTE — Assessment & Plan Note (Addendum)
Unable to care for self History of right hip fracture 09/2021 Frailty and generalized physical deconditioning PT and TOC consult

## 2021-10-27 NOTE — ED Notes (Signed)
See triage note  states he had a fall 4 days ago  and he fell again today twice   having pain to right knee and leg pain  states he has been using a walker but fell anyway

## 2021-10-27 NOTE — H&P (Incomplete)
History and Physical    Patient: Phillip Reyes INO:[id number] DOB: May 25, 1940 DOA: 10/27/2021 DOS: the patient was seen and examined on 10/27/2021 PCP: [facility] [place]  Patient coming from: Home  Chief Complaint:  Chief Complaint  Patient presents with   Leg Pain    HPI: Phillip Reyes is a 81 y.o. male with medical history significant for DM, HTN, CKD 4 with chronic anemia, COPD, HTN, chronic systolic heart failure, who is status post right hip repair in May 2011 who presents to the ED with a mechanical fall after describing that his right knee just gave out.  He has had a few falls in the past few weeks even when using his walker for ambulation.  At the present for he hit his head but did not lose consciousness.  He reports worsening pain down his right knee through the foot and endorses chronic pain of the right leg since his surgery a month prior.  He also endorses generalized weakness but denies otherwise feeling out of his usual state of health.  Denies recent cough, cold, fever or chills, nausea or vomiting, abdominal pain or diarrhea.  It has been becoming increasingly difficult to care for himself at home ED course and data review: Afebrile with pulse of 60 and BP 142/54. Labs: WBC 10,600, hemoglobin 10.5, down from baseline of 12.2 a couple weeks prior, creatinine 3.9 up from the best of 3.1 about 8 months prior.  Labs otherwise unremarkable.  Troponin of 10 EKG, personally viewed and interpreted: Atrial paced rhythm at 62 with nonspecific ST-T wave changes Imaging included CT head, C-spine, right foot and ankle hip pelvis and right knee all nontraumatic Chest x-ray nonacute  Patient treated with an IV fluid bolus.  Hospitalist consulted for admission.   Review of Systems: As mentioned in the history of present illness. All other systems reviewed and are negative.  Past Medical History:  Diagnosis Date   Arthritis    Cancer ([place])    COPD (chronic obstructive pulmonary  disease) ([place])    Diabetes mellitus without complication ([place])    Hypertension    Renal disorder    Stroke [facility])    Past Surgical History:  Procedure Laterality Date   APPENDECTOMY     INTRAMEDULLARY (IM) NAIL INTERTROCHANTERIC Right 09/15/2021   Procedure: INTRAMEDULLARY (IM) NAIL INTERTROCHANTRIC;  Surgeon: Hessie Knows, MD;  Location: [facility] ORS;  Service: Orthopedics;  Laterality: Right;   THROAT SURGERY     Social History:  reports that he has been smoking cigarettes. He has a 30.50 pack-year smoking history. He has never used smokeless tobacco. He reports that he does not drink alcohol and does not use drugs.  Allergies  Allergen Reactions   Norco [Hydrocodone-Acetaminophen] Shortness Of Breath    Family History  Problem Relation Age of Onset   Congestive Heart Failure Father    Other Mother        unknown medical history    Prior to Admission medications   Medication Sig Start Date End Date Taking? Authorizing Provider  acetaminophen (TYLENOL) 500 MG tablet Take 2 tablets (1,000 mg total) by mouth 3 (three) times daily as needed for mild pain, moderate pain or headache. 09/19/21   Duanne Guess, PA-C  aspirin EC 81 MG tablet Take 81 mg by mouth daily.    [provider]  atorvastatin (LIPITOR) 80 MG tablet Take 80 mg by mouth at bedtime.    [provider]  azelastine (ASTELIN) 0.1 % nasal spray Place 1  spray into both nostrils 2 (two) times daily as needed for rhinitis.    [provider]  budesonide-formoterol (SYMBICORT) 160-4.5 MCG/ACT inhaler Inhale 2 puffs into the lungs 2 (two) times daily.    [provider]  carvedilol (COREG) 6.25 MG tablet Take 1 tablet (6.25 mg total) by mouth 2 (two) times daily with a meal. 09/23/21   Antonieta Pert, MD  Cholecalciferol (VITAMIN D) 50 MCG (2000 UT) CAPS Take 1 capsule by mouth daily. Patient not taking: Reported on 09/15/2021    [provider]  citalopram (CELEXA) 20 MG tablet Take 20  mg by mouth daily.    [provider]  docusate sodium (COLACE) 100 MG capsule Take 1 capsule (100 mg total) by mouth 2 (two) times daily. 09/23/21   Antonieta Pert, MD  enoxaparin (LOVENOX) 40 MG/0.4ML injection Inject 0.4 mLs (40 mg total) into the skin daily for 14 days. 09/19/21 10/03/21  Duanne Guess, PA-C  ferrous [id number] C-folic acid (TRINSICON / FOLTRIN) capsule Take 1 capsule by mouth 2 (two) times daily. 09/23/21   Antonieta Pert, MD  gabapentin (NEURONTIN) 300 MG capsule Take 300 mg by mouth daily.     [provider]  insulin glargine (LANTUS) 100 UNIT/ML injection Inject 10 Units into the skin at bedtime.     [provider]  insulin regular (NOVOLIN R) 100 units/mL injection Inject 3-10 Units into the skin 3 (three) times daily before meals.    [provider]  ipratropium-albuterol (DUONEB) 0.5-2.5 (3) MG/3ML SOLN Take 3 mLs by nebulization every 6 (six) hours as needed.    [provider]  omeprazole (PRILOSEC) 20 MG capsule Take 20 mg by mouth daily. Patient not taking: Reported on 09/15/2021    [provider]  tamsulosin (FLOMAX) 0.4 MG CAPS capsule Take 0.8 mg by mouth at bedtime.    [provider]  traZODone (DESYREL) 50 MG tablet Take 50 mg by mouth at bedtime.    [provider]  vitamin B-12 (CYANOCOBALAMIN) 1000 MCG tablet Take 1,000 mcg by mouth daily. Patient not taking: Reported on 09/15/2021    [provider]    Physical Exam: Vitals:   10/27/21 1504 10/27/21 1525 10/27/21 1736 10/27/21 1838  BP: (!) 142/54  (!) 133/58 130/60  Pulse: 60  60 68  Resp: '18  19 18  '$ Temp: 98 F (36.7 C)     TempSrc: Oral     SpO2: 100%  99% 99%  Weight:  70.3 kg    Height:  '5\' 10"'$  (1.778 m)     Physical Exam  Labs on Admission: I have personally reviewed following labs and imaging studies  CBC: Recent Labs  Lab 10/23/21 1929 10/27/21 1808  WBC 9.7 10.6*  NEUTROABS  --  7.8*  HGB 12.2*  10.5*  HCT 36.7* 31.0*  MCV 91.8 89.9  PLT 276 885   Basic Metabolic Panel: Recent Labs  Lab 10/23/21 1929 10/27/21 1808  NA 138 138  K 3.7 4.2  CL 101 106  CO2 26 23  GLUCOSE 114* 122*  BUN 43* 58*  CREATININE 3.56* 3.92*  CALCIUM 9.2 9.0   GFR: Estimated Creatinine Clearance: 14.9 mL/min (A) (by C-G formula based on SCr of 3.92 mg/dL (H)). Liver Function Tests: Recent Labs  Lab 10/23/21 1929 10/27/21 1808  AST 21 13*  ALT 12 11  ALKPHOS 96 91  BILITOT 1.0 0.4  PROT 7.3 6.4*  ALBUMIN 3.8 3.4*   No results for input(s): "LIPASE", "  AMYLASE" in the last 168 hours. No results for input(s): "AMMONIA" in the last 168 hours. Coagulation Profile: No results for input(s): "INR", "PROTIME" in the last 168 hours. Cardiac Enzymes: Recent Labs  Lab 10/23/21 1929  CKTOTAL 37*   BNP (last 3 results) No results for input(s): "PROBNP" in the last 8760 hours. HbA1C: No results for input(s): "HGBA1C" in the last 72 hours. CBG: Recent Labs  Lab 10/24/21 1706 10/24/21 2223 10/25/21 0810 10/25/21 1227 10/25/21 2101  GLUCAP 122* 88 154* 106* 164*   Lipid Profile: No results for input(s): "CHOL", "HDL", "LDLCALC", "TRIG", "CHOLHDL", "LDLDIRECT" in the last 72 hours. Thyroid Function Tests: No results for input(s): "TSH", "T4TOTAL", "FREET4", "T3FREE", "THYROIDAB" in the last 72 hours. Anemia Panel: No results for input(s): "VITAMINB12", "FOLATE", "FERRITIN", "TIBC", "IRON", "RETICCTPCT" in the last 72 hours. Urine analysis:    Component Value Date/Time   COLORURINE YELLOW (A) 06/22/2021 1800   APPEARANCEUR CLEAR (A) 06/22/2021 1800   APPEARANCEUR Clear 02/17/2013 0013   LABSPEC 1.015 06/22/2021 1800   LABSPEC 1.029 02/17/2013 0013   PHURINE 5.0 06/22/2021 1800   GLUCOSEU NEGATIVE 06/22/2021 1800   GLUCOSEU >=500 02/17/2013 0013   HGBUR NEGATIVE 06/22/2021 1800   BILIRUBINUR NEGATIVE 06/22/2021 1800   BILIRUBINUR Negative 02/17/2013 0013   KETONESUR NEGATIVE  06/22/2021 1800   PROTEINUR NEGATIVE 06/22/2021 1800   NITRITE NEGATIVE 06/22/2021 1800   LEUKOCYTESUR NEGATIVE 06/22/2021 1800   LEUKOCYTESUR Negative 02/17/2013 0013    Radiological Exams on Admission: DG Knee 2 Views Right  Result Date: 10/27/2021 CLINICAL DATA:  Fall EXAM: RIGHT KNEE - 1-2 VIEW COMPARISON:  None Available. FINDINGS: Partially visualized intramedullary rod in the femur with distal fixating screw. No acute fracture or malalignment is seen. No sizable knee effusion. Vascular calcifications. IMPRESSION: No acute osseous abnormality Electronically Signed   By: Donavan Foil M.D.   On: 10/27/2021 17:16   DG Hip Unilat W or Wo Pelvis 2-3 Views Right  Result Date: 10/27/2021 CLINICAL DATA:  Fall EXAM: DG HIP (WITH OR WITHOUT PELVIS) 2-3V RIGHT COMPARISON:  10/23/2021, 09/14/2021 FINDINGS: SI joints are non widened. Pubic symphysis and rami appear intact. Intramedullary rod in the right femur with displaced lesser trochanteric fracture fragment as before. No interval acute fracture. Degenerative changes of the left hip. Vascular calcifications IMPRESSION: Intramedullary rod in the right femur with prior intertrochanteric fracture. No interval new fracture is seen. Electronically Signed   By: Donavan Foil M.D.   On: 10/27/2021 17:15   DG Ankle Complete Right  Result Date: 10/27/2021 CLINICAL DATA:  Status post fall 4 days ago.  Leg pain. EXAM: RIGHT ANKLE - COMPLETE 3+ VIEW COMPARISON:  None Available. FINDINGS: There is no evidence of fracture, dislocation, or joint effusion. Well corticated ossific densities adjacent to the medial malleolus may reflect sequelae of remote trauma or ossicles. Small plantar and posterior calcaneal heel spurs. Soft tissues are unremarkable. IMPRESSION: 1. No acute findings. 2. Small plantar and posterior calcaneal heel spurs. Electronically Signed   By: Kerby Moors M.D.   On: 10/27/2021 17:13   DG Foot 2 Views Right  Result Date:  10/27/2021 CLINICAL DATA:  Fall with foot pain EXAM: RIGHT FOOT - 2 VIEW COMPARISON:  None Available. FINDINGS: Mild vascular calcification. No definite acute displaced fracture or subluxation. Moderate arthritis at the first MTP joint. Soft tissue growth or ulcer at the distal fifth digit with possible additional lobulated soft tissue growth at the dorsum of the foot on lateral view at the level  of the proximal phalanges. IMPRESSION: 1. No acute displaced fracture 2. Soft tissue growth or ulcer at the distal fifth digit and dorsum of foot as described above. Recommend correlation with direct inspection Electronically Signed   By: Donavan Foil M.D.   On: 10/27/2021 17:12   DG Chest 1 View  Result Date: 10/27/2021 CLINICAL DATA:  Multiple falls in the last 4 days since hip surgery. EXAM: CHEST  1 VIEW COMPARISON:  Radiograph Sep 20, 2021 FINDINGS: Left chest AICD/pacemaker with leads projecting over the right atrium and right ventricle. The heart size and mediastinal contours are unchanged. Aortic atherosclerosis. No focal airspace consolidation. No visible pleural effusion or pneumothorax. The visualized skeletal structures are unchanged. IMPRESSION: No acute cardiopulmonary disease. Electronically Signed   By: Dahlia Bailiff M.D.   On: 10/27/2021 17:11   CT Head Wo Contrast  Result Date: 10/27/2021 CLINICAL DATA:  Neck trauma, fall 4 days ago EXAM: CT HEAD WITHOUT CONTRAST CT CERVICAL SPINE WITHOUT CONTRAST TECHNIQUE: Multidetector CT imaging of the head and cervical spine was performed following the standard protocol without intravenous contrast. Multiplanar CT image reconstructions of the cervical spine were also generated. RADIATION DOSE REDUCTION: This exam was performed according to the departmental dose-optimization program which includes automated exposure control, adjustment of the mA and/or kV according to patient size and/or use of iterative reconstruction technique. COMPARISON:  10/23/2021  FINDINGS: CT HEAD FINDINGS Brain: No evidence of acute infarction, hemorrhage, hydrocephalus, extra-axial collection or mass lesion/mass effect. Unchanged mild asymmetry of the right lateral ventricle. Vascular: No hyperdense vessel or unexpected calcification. Skull: Normal. Negative for fracture or focal lesion. Sinuses/Orbits: No acute finding. Other: None. CT CERVICAL SPINE FINDINGS Alignment: Normal. Skull base and vertebrae: No acute fracture. No primary bone lesion or focal pathologic process. Soft tissues and spinal canal: No prevertebral fluid or swelling. No visible canal hematoma. Disc levels:  Minimal multilevel disc space height loss. Upper chest: Negative. Other: None. IMPRESSION: 1. No acute intracranial pathology. 2. No fracture or static subluxation of the cervical spine. Electronically Signed   By: Delanna Ahmadi M.D.   On: 10/27/2021 16:42   CT Cervical Spine Wo Contrast  Result Date: 10/27/2021 CLINICAL DATA:  Neck trauma, fall 4 days ago EXAM: CT HEAD WITHOUT CONTRAST CT CERVICAL SPINE WITHOUT CONTRAST TECHNIQUE: Multidetector CT imaging of the head and cervical spine was performed following the standard protocol without intravenous contrast. Multiplanar CT image reconstructions of the cervical spine were also generated. RADIATION DOSE REDUCTION: This exam was performed according to the departmental dose-optimization program which includes automated exposure control, adjustment of the mA and/or kV according to patient size and/or use of iterative reconstruction technique. COMPARISON:  10/23/2021 FINDINGS: CT HEAD FINDINGS Brain: No evidence of acute infarction, hemorrhage, hydrocephalus, extra-axial collection or mass lesion/mass effect. Unchanged mild asymmetry of the right lateral ventricle. Vascular: No hyperdense vessel or unexpected calcification. Skull: Normal. Negative for fracture or focal lesion. Sinuses/Orbits: No acute finding. Other: None. CT CERVICAL SPINE FINDINGS Alignment:  Normal. Skull base and vertebrae: No acute fracture. No primary bone lesion or focal pathologic process. Soft tissues and spinal canal: No prevertebral fluid or swelling. No visible canal hematoma. Disc levels:  Minimal multilevel disc space height loss. Upper chest: Negative. Other: None. IMPRESSION: 1. No acute intracranial pathology. 2. No fracture or static subluxation of the cervical spine. Electronically Signed   By: Delanna Ahmadi M.D.   On: 10/27/2021 16:42     Data Reviewed: Relevant notes from primary care and specialist  visits, past discharge summaries as available in EHR, including Care Everywhere. Prior diagnostic testing as pertinent to current admission diagnoses Updated medications and problem lists for reconciliation ED course, including vitals, labs, imaging, treatment and response to treatment Triage notes, nursing and pharmacy notes and ED provider's notes Notable results as noted in HPI   Assessment and Plan: * Frequent falls Unable to care for self History of right hip fracture 09/2021 Frailty and generalized physical deconditioning PT and TOC consult  Acute on chronic anemia Hemoglobin 10.5, down from 12.2 a couple weeks prior Stool guaiac negative in the ED No prior history of bleeding but did have a bit of postoperative blood loss in May 2023 following his hip fracture repair Anemia panel Continue to trend H&H  CKD (chronic kidney disease) stage 4, GFR 15-29 ml/min (H[facility]) Mild worsening of CKD 4 with creatinine of 3.9.  Creatinine usually around 3.6, best in past 6 months was 3.1  Chronic systolic CHF (congestive heart failure) ([facility]) Euvolemic to dry.  Continue carvedilol  COPD (chronic obstructive pulmonary disease) ([place]) Continue home inhalers.  DuoNebs as needed  HTN (hypertension) Blood pressure controlled.  Continue carvedilol  Insulin dependent type 2 diabetes mellitus, controlled ([facility]) Continue basal insulin with sliding scale  coverage        DVT prophylaxis: Lovenox  Consults: none  Advance Care Planning:   Code Status: Prior   Family Communication: none  Disposition Plan: Back to previous home environment  Severity of Illness: The appropriate patient status for this patient is INPATIENT. Inpatient status is judged to be reasonable and necessary in order to provide the required intensity of service to ensure the patient's safety. The patient's presenting symptoms, physical exam findings, and initial radiographic and laboratory data in the context of their chronic comorbidities is felt to place them at high risk for further clinical deterioration. Furthermore, it is not anticipated that the patient will be medically stable for discharge from the hospital within 2 midnights of admission.   * I certify that at the point of admission it is my clinical judgment that the patient will require inpatient hospital care spanning beyond 2 midnights from the point of admission due to high intensity of service, high risk for further deterioration and high frequency of surveillance required.*  Author: Athena Masse, MD 10/27/2021 7:47 PM  For on call review [email or web address].

## 2021-10-27 NOTE — ED Notes (Signed)
Attempted for 22g at L ac.

## 2021-10-28 DIAGNOSIS — C61 Malignant neoplasm of prostate: Secondary | ICD-10-CM | POA: Insufficient documentation

## 2021-10-28 DIAGNOSIS — I471 Supraventricular tachycardia, unspecified: Secondary | ICD-10-CM | POA: Insufficient documentation

## 2021-10-28 DIAGNOSIS — Z95810 Presence of automatic (implantable) cardiac defibrillator: Secondary | ICD-10-CM | POA: Insufficient documentation

## 2021-10-28 LAB — URINALYSIS, COMPLETE (UACMP) WITH MICROSCOPIC
Bacteria, UA: NONE SEEN
Bilirubin Urine: NEGATIVE
Glucose, UA: NEGATIVE mg/dL
Hgb urine dipstick: NEGATIVE
Ketones, ur: NEGATIVE mg/dL
Leukocytes,Ua: NEGATIVE
Nitrite: NEGATIVE
Protein, ur: NEGATIVE mg/dL
Specific Gravity, Urine: 1.006 (ref 1.005–1.030)
Squamous Epithelial / HPF: NONE SEEN (ref 0–5)
pH: 5 (ref 5.0–8.0)

## 2021-10-28 LAB — CBC
HCT: 27.6 % — ABNORMAL LOW (ref 39.0–52.0)
Hemoglobin: 9.3 g/dL — ABNORMAL LOW (ref 13.0–17.0)
MCH: 31 pg (ref 26.0–34.0)
MCHC: 33.7 g/dL (ref 30.0–36.0)
MCV: 92 fL (ref 80.0–100.0)
Platelets: 233 10*3/uL (ref 150–400)
RBC: 3 MIL/uL — ABNORMAL LOW (ref 4.22–5.81)
RDW: 13.6 % (ref 11.5–15.5)
WBC: 7.7 10*3/uL (ref 4.0–10.5)
nRBC: 0 % (ref 0.0–0.2)

## 2021-10-28 LAB — GLUCOSE, CAPILLARY
Glucose-Capillary: 108 mg/dL — ABNORMAL HIGH (ref 70–99)
Glucose-Capillary: 172 mg/dL — ABNORMAL HIGH (ref 70–99)
Glucose-Capillary: 160 mg/dL — ABNORMAL HIGH (ref 70–99)
Glucose-Capillary: 144 mg/dL — ABNORMAL HIGH (ref 70–99)

## 2021-10-28 LAB — IRON AND TIBC
Iron: 24 ug/dL — ABNORMAL LOW (ref 45–182)
Saturation Ratios: 13 % — ABNORMAL LOW (ref 17.9–39.5)
TIBC: 188 ug/dL — ABNORMAL LOW (ref 250–450)
UIBC: 164 ug/dL

## 2021-10-28 LAB — BASIC METABOLIC PANEL
Anion gap: 6 (ref 5–15)
BUN: 52 mg/dL — ABNORMAL HIGH (ref 8–23)
CO2: 25 mmol/L (ref 22–32)
Calcium: 8.4 mg/dL — ABNORMAL LOW (ref 8.9–10.3)
Chloride: 110 mmol/L (ref 98–111)
Creatinine, Ser: 3.64 mg/dL — ABNORMAL HIGH (ref 0.61–1.24)
GFR, Estimated: 16 mL/min — ABNORMAL LOW (ref >60)
Glucose, Bld: 145 mg/dL — ABNORMAL HIGH (ref 70–99)
Potassium: 3.7 mmol/L (ref 3.5–5.1)
Sodium: 141 mmol/L (ref 135–145)

## 2021-10-28 LAB — RETICULOCYTES
Immature Retic Fract: 6.9 % (ref 2.3–15.9)
RBC.: 3.06 MIL/uL — ABNORMAL LOW (ref 4.22–5.81)
Retic Count, Absolute: 47.4 10*3/uL (ref 19.0–186.0)
Retic Ct Pct: 1.6 % (ref 0.4–3.1)

## 2021-10-28 LAB — FOLATE: Folate: 14.7 ng/mL (ref >5.9)

## 2021-10-28 LAB — VITAMIN B12: Vitamin B-12: 536 pg/mL (ref 180–914)

## 2021-10-28 LAB — FERRITIN: Ferritin: 191 ng/mL (ref 24–336)

## 2021-10-28 MED ORDER — GABAPENTIN 300 MG PO CAPS
300.0000 mg | ORAL_CAPSULE | Freq: Every day | ORAL | Status: DC
  Administered 2021-10-28 – 2021-11-01 (×5): 300 mg via ORAL
  Filled 2021-10-28 (×5): qty 1

## 2021-10-28 MED ORDER — OXYCODONE HCL 5 MG PO TABS
5.0000 mg | ORAL_TABLET | Freq: Once | ORAL | Status: AC
  Administered 2021-10-28: 5 mg via ORAL
  Filled 2021-10-28: qty 1

## 2021-10-28 MED ORDER — HEPARIN SODIUM (PORCINE) 5000 UNIT/ML IJ SOLN
5000.0000 [IU] | Freq: Three times a day (TID) | INTRAMUSCULAR | Status: DC
  Administered 2021-10-28 – 2021-11-01 (×11): 5000 [IU] via SUBCUTANEOUS
  Filled 2021-10-28 (×12): qty 1

## 2021-10-28 MED ORDER — FERROUS SULFATE 325 (65 FE) MG PO TABS
325.0000 mg | ORAL_TABLET | ORAL | Status: DC
  Administered 2021-10-28 – 2021-11-01 (×3): 325 mg via ORAL
  Filled 2021-10-28 (×3): qty 1

## 2021-10-28 MED ORDER — DOCUSATE SODIUM 100 MG PO CAPS
100.0000 mg | ORAL_CAPSULE | Freq: Two times a day (BID) | ORAL | Status: DC
  Administered 2021-10-28 – 2021-11-01 (×9): 100 mg via ORAL
  Filled 2021-10-28 (×9): qty 1

## 2021-10-29 LAB — GLUCOSE, CAPILLARY
Glucose-Capillary: 89 mg/dL (ref 70–99)
Glucose-Capillary: 177 mg/dL — ABNORMAL HIGH (ref 70–99)
Glucose-Capillary: 122 mg/dL — ABNORMAL HIGH (ref 70–99)
Glucose-Capillary: 133 mg/dL — ABNORMAL HIGH (ref 70–99)

## 2021-10-29 NOTE — Progress Notes (Addendum)
PROGRESS NOTE    Phillip Reyes  ZOX:[id number] DOB: 08-Jan-1941 DOA: 10/27/2021 PCP: [facility]      Brief Narrative:   From admission h and p Phillip Reyes is a 81 y.o. male with medical history significant for DM, HTN, CKD 4 with chronic anemia, COPD, HTN, chronic systolic heart failure, who is status post right hip repair in May 2011 who presents to the ED with a mechanical fall after describing that his right knee just gave out.  He has had a few falls in the past few weeks even when using his walker for ambulation.  At the present fall he hit his head but did not lose consciousness.  He reports worsening pain down his right knee through the foot and endorses chronic pain of the right leg since his surgery a month prior.  He also endorses generalized weakness but denies otherwise feeling out of his usual state of health.  Denies recent cough, cold, fever or chills, nausea or vomiting, abdominal pain or diarrhea.  It has been becoming increasingly difficult to care for himself at home   Assessment & Plan:   Principal Problem:   Frequent falls Active Problems:   Unable to care for self   History of fracture of right hip 09/2021   CKD (chronic kidney disease) stage 4, GFR 15-29 ml/min (HCC)   Acute on chronic anemia   Insulin dependent type 2 diabetes mellitus, controlled (HCC)   HTN (hypertension)   COPD (chronic obstructive pulmonary disease) (HCC)   H/O: stroke   Chronic systolic CHF (congestive heart failure) (HCC)   # Frequent falls # Recent fracture of right hip s/p operative repair Several recent falls at home. Right hip repair last month, completed 2 weeks rehab and says was doing pretty good at home then fell about a week ago and since then lots of pain right leg, trouble ambulating as a result, several falls as a result. CT head and neck, imaging of right leg and hip negative for acute process. Has residual right sided weakness from prior strokes - PT/OT  consulted advising snf, TOC working on placement. Looking for [facility] bed, will likely take some time to find one  # History CVA # Dysphagia With residual right sided weakness and dysphagia patient says is at baseline - SLP consulted and cleared for diet - cont home aspirin, atorvastatin  # Iron deficiency anemia Stable, s/p hip fx, no report of melena or hematochezia - started oral iron  # CKD stage 4 GFR 16, stable, this appears to be new baseline of gfr 15-20 - outpt nephro f/u - renally dose meds  # GAD - home citalopram  # Chronic pain - home gabapentin  # T2DM Here glucose wnl - decrease semglee from 10 to 7 qhs - SSI  # COPD Quiescent - home dulera, prn duonebs  # BPH - home flomax  # Insomnia - home trazodone  # HFrEF Most recent EF 40-45%. Compensated, euvolemic - cont home coreg    DVT prophylaxis: heparin Code Status: full Family Communication: daughter updated telephonically 6/24. Shared decision to touch base again on Monday  Level of care: Med-Surg Status is: Inpatient Remains inpatient appropriate because: unsafe disposition    Consultants:  none  Procedures: none  Antimicrobials:  none    Subjective: This morning feeling well, tolerating diet  Objective: Vitals:   10/28/21 1548 10/28/21 2024 10/29/21 0510 10/29/21 0832  BP: (!) 114/45 (!) 122/51 (!) 124/54 (!) 118/50  Pulse: Marland Kitchen)  59 (!) 59 60 60  Resp: 16 15 16 16   Temp: 98.2 F (36.8 C) 99.3 F (37.4 C) 97.6 F (36.4 C) 98.5 F (36.9 C)  TempSrc:      SpO2: 99% 98% 99% 94%  Weight:      Height:        Intake/Output Summary (Last 24 hours) at 10/29/2021 1311 Last data filed at 10/29/2021 0900 Gross per 24 hour  Intake 480 ml  Output 700 ml  Net -220 ml   Filed Weights   10/27/21 1525  Weight: 70.3 kg    Examination:  General exam: Appears calm and comfortable  Respiratory system: Clear to auscultation. Respiratory effort normal. Cardiovascular system:  S1 & S2 heard, RRR. No JVD, murmurs, rubs, gallops or clicks. No pedal edema. Gastrointestinal system: Abdomen is nondistended, soft and nontender. No organomegaly or masses felt. Normal bowel sounds heard. Central nervous system: right sided weakness Extremities: no edema Skin: No rashes, lesions or ulcers Psychiatry: Judgement and insight appear normal. Mood & affect appropriate.     Data Reviewed: I have personally reviewed following labs and imaging studies  CBC: Recent Labs  Lab 10/23/21 1929 10/27/21 1808 10/28/21 0542  WBC 9.7 10.6* 7.7  NEUTROABS  --  7.8*  --   HGB 12.2* 10.5* 9.3*  HCT 36.7* 31.0* 27.6*  MCV 91.8 89.9 92.0  PLT 276 265 233   Basic Metabolic Panel: Recent Labs  Lab 10/23/21 1929 10/27/21 1808 10/28/21 0542  NA 138 138 141  K 3.7 4.2 3.7  CL 101 106 110  CO2 26 23 25   GLUCOSE 114* 122* 145*  BUN 43* 58* 52*  CREATININE 3.56* 3.92* 3.64*  CALCIUM 9.2 9.0 8.4*   GFR: Estimated Creatinine Clearance: 16.1 mL/min (A) (by C-G formula based on SCr of 3.64 mg/dL (H)). Liver Function Tests: Recent Labs  Lab 10/23/21 1929 10/27/21 1808  AST 21 13*  ALT 12 11  ALKPHOS 96 91  BILITOT 1.0 0.4  PROT 7.3 6.4*  ALBUMIN 3.8 3.4*   No results for input(s): "LIPASE", "AMYLASE" in the last 168 hours. No results for input(s): "AMMONIA" in the last 168 hours. Coagulation Profile: No results for input(s): "INR", "PROTIME" in the last 168 hours. Cardiac Enzymes: Recent Labs  Lab 10/23/21 1929  CKTOTAL 37*   BNP (last 3 results) No results for input(s): "PROBNP" in the last 8760 hours. HbA1C: No results for input(s): "HGBA1C" in the last 72 hours. CBG: Recent Labs  Lab 10/28/21 1155 10/28/21 1640 10/28/21 2220 10/29/21 0724 10/29/21 1140  GLUCAP 172* 160* 144* 89 177*   Lipid Profile: No results for input(s): "CHOL", "HDL", "LDLCALC", "TRIG", "CHOLHDL", "LDLDIRECT" in the last 72 hours. Thyroid Function Tests: No results for input(s):  "TSH", "T4TOTAL", "FREET4", "T3FREE", "THYROIDAB" in the last 72 hours. Anemia Panel: Recent Labs    10/28/21 0542  VITAMINB12 536  FOLATE 14.7  FERRITIN 191  TIBC 188*  IRON 24*  RETICCTPCT 1.6   Urine analysis:    Component Value Date/Time   COLORURINE STRAW (A) 10/28/2021 0635   APPEARANCEUR CLEAR (A) 10/28/2021 0635   APPEARANCEUR Clear 02/17/2013 0013   LABSPEC 1.006 10/28/2021 0635   LABSPEC 1.029 02/17/2013 0013   PHURINE 5.0 10/28/2021 0635   GLUCOSEU NEGATIVE 10/28/2021 0635   GLUCOSEU >=500 02/17/2013 0013   HGBUR NEGATIVE 10/28/2021 0635   BILIRUBINUR NEGATIVE 10/28/2021 0635   BILIRUBINUR Negative 02/17/2013 0013   KETONESUR NEGATIVE 10/28/2021 0635   PROTEINUR NEGATIVE 10/28/2021 0102  NITRITE NEGATIVE 10/28/2021 0635   LEUKOCYTESUR NEGATIVE 10/28/2021 0635   LEUKOCYTESUR Negative 02/17/2013 0013   Sepsis Labs: @LABRCNTIP (procalcitonin:4,lacticidven:4)  )No results found for this or any previous visit (from the past 240 hour(s)).       Radiology Studies: DG Knee 2 Views Right  Result Date: 10/27/2021 CLINICAL DATA:  Fall EXAM: RIGHT KNEE - 1-2 VIEW COMPARISON:  None Available. FINDINGS: Partially visualized intramedullary rod in the femur with distal fixating screw. No acute fracture or malalignment is seen. No sizable knee effusion. Vascular calcifications. IMPRESSION: No acute osseous abnormality Electronically Signed   By: Jasmine Pang M.D.   On: 10/27/2021 17:16   DG Hip Unilat W or Wo Pelvis 2-3 Views Right  Result Date: 10/27/2021 CLINICAL DATA:  Fall EXAM: DG HIP (WITH OR WITHOUT PELVIS) 2-3V RIGHT COMPARISON:  10/23/2021, 09/14/2021 FINDINGS: SI joints are non widened. Pubic symphysis and rami appear intact. Intramedullary rod in the right femur with displaced lesser trochanteric fracture fragment as before. No interval acute fracture. Degenerative changes of the left hip. Vascular calcifications IMPRESSION: Intramedullary rod in the right femur  with prior intertrochanteric fracture. No interval new fracture is seen. Electronically Signed   By: Jasmine Pang M.D.   On: 10/27/2021 17:15   DG Ankle Complete Right  Result Date: 10/27/2021 CLINICAL DATA:  Status post fall 4 days ago.  Leg pain. EXAM: RIGHT ANKLE - COMPLETE 3+ VIEW COMPARISON:  None Available. FINDINGS: There is no evidence of fracture, dislocation, or joint effusion. Well corticated ossific densities adjacent to the medial malleolus may reflect sequelae of remote trauma or ossicles. Small plantar and posterior calcaneal heel spurs. Soft tissues are unremarkable. IMPRESSION: 1. No acute findings. 2. Small plantar and posterior calcaneal heel spurs. Electronically Signed   By: Signa Kell M.D.   On: 10/27/2021 17:13   DG Foot 2 Views Right  Result Date: 10/27/2021 CLINICAL DATA:  Fall with foot pain EXAM: RIGHT FOOT - 2 VIEW COMPARISON:  None Available. FINDINGS: Mild vascular calcification. No definite acute displaced fracture or subluxation. Moderate arthritis at the first MTP joint. Soft tissue growth or ulcer at the distal fifth digit with possible additional lobulated soft tissue growth at the dorsum of the foot on lateral view at the level of the proximal phalanges. IMPRESSION: 1. No acute displaced fracture 2. Soft tissue growth or ulcer at the distal fifth digit and dorsum of foot as described above. Recommend correlation with direct inspection Electronically Signed   By: Jasmine Pang M.D.   On: 10/27/2021 17:12   DG Chest 1 View  Result Date: 10/27/2021 CLINICAL DATA:  Multiple falls in the last 4 days since hip surgery. EXAM: CHEST  1 VIEW COMPARISON:  Radiograph Sep 20, 2021 FINDINGS: Left chest AICD/pacemaker with leads projecting over the right atrium and right ventricle. The heart size and mediastinal contours are unchanged. Aortic atherosclerosis. No focal airspace consolidation. No visible pleural effusion or pneumothorax. The visualized skeletal structures are  unchanged. IMPRESSION: No acute cardiopulmonary disease. Electronically Signed   By: Maudry Mayhew M.D.   On: 10/27/2021 17:11   CT Head Wo Contrast  Result Date: 10/27/2021 CLINICAL DATA:  Neck trauma, fall 4 days ago EXAM: CT HEAD WITHOUT CONTRAST CT CERVICAL SPINE WITHOUT CONTRAST TECHNIQUE: Multidetector CT imaging of the head and cervical spine was performed following the standard protocol without intravenous contrast. Multiplanar CT image reconstructions of the cervical spine were also generated. RADIATION DOSE REDUCTION: This exam was performed according to the departmental dose-optimization  program which includes automated exposure control, adjustment of the mA and/or kV according to patient size and/or use of iterative reconstruction technique. COMPARISON:  10/23/2021 FINDINGS: CT HEAD FINDINGS Brain: No evidence of acute infarction, hemorrhage, hydrocephalus, extra-axial collection or mass lesion/mass effect. Unchanged mild asymmetry of the right lateral ventricle. Vascular: No hyperdense vessel or unexpected calcification. Skull: Normal. Negative for fracture or focal lesion. Sinuses/Orbits: No acute finding. Other: None. CT CERVICAL SPINE FINDINGS Alignment: Normal. Skull base and vertebrae: No acute fracture. No primary bone lesion or focal pathologic process. Soft tissues and spinal canal: No prevertebral fluid or swelling. No visible canal hematoma. Disc levels:  Minimal multilevel disc space height loss. Upper chest: Negative. Other: None. IMPRESSION: 1. No acute intracranial pathology. 2. No fracture or static subluxation of the cervical spine. Electronically Signed   By: Jearld Lesch M.D.   On: 10/27/2021 16:42   CT Cervical Spine Wo Contrast  Result Date: 10/27/2021 CLINICAL DATA:  Neck trauma, fall 4 days ago EXAM: CT HEAD WITHOUT CONTRAST CT CERVICAL SPINE WITHOUT CONTRAST TECHNIQUE: Multidetector CT imaging of the head and cervical spine was performed following the standard protocol  without intravenous contrast. Multiplanar CT image reconstructions of the cervical spine were also generated. RADIATION DOSE REDUCTION: This exam was performed according to the departmental dose-optimization program which includes automated exposure control, adjustment of the mA and/or kV according to patient size and/or use of iterative reconstruction technique. COMPARISON:  10/23/2021 FINDINGS: CT HEAD FINDINGS Brain: No evidence of acute infarction, hemorrhage, hydrocephalus, extra-axial collection or mass lesion/mass effect. Unchanged mild asymmetry of the right lateral ventricle. Vascular: No hyperdense vessel or unexpected calcification. Skull: Normal. Negative for fracture or focal lesion. Sinuses/Orbits: No acute finding. Other: None. CT CERVICAL SPINE FINDINGS Alignment: Normal. Skull base and vertebrae: No acute fracture. No primary bone lesion or focal pathologic process. Soft tissues and spinal canal: No prevertebral fluid or swelling. No visible canal hematoma. Disc levels:  Minimal multilevel disc space height loss. Upper chest: Negative. Other: None. IMPRESSION: 1. No acute intracranial pathology. 2. No fracture or static subluxation of the cervical spine. Electronically Signed   By: Jearld Lesch M.D.   On: 10/27/2021 16:42        Scheduled Meds:  aspirin EC  81 mg Oral Daily   atorvastatin  80 mg Oral QHS   carvedilol  6.25 mg Oral BID WC   citalopram  20 mg Oral Daily   docusate sodium  100 mg Oral BID   ferrous sulfate  325 mg Oral Q48H   gabapentin  300 mg Oral Daily   heparin injection (subcutaneous)  5,000 Units Subcutaneous Q8H   insulin aspart  0-5 Units Subcutaneous QHS   insulin aspart  0-6 Units Subcutaneous TID WC   insulin glargine-yfgn  10 Units Subcutaneous QHS   mometasone-formoterol  2 puff Inhalation BID   tamsulosin  0.8 mg Oral QHS   traZODone  50 mg Oral QHS   Continuous Infusions:   LOS: 2 days     Silvano Bilis, MD [facility]   If 7PM-7AM,  please contact night-coverage [email or web address] Password [email or web address] 10/29/2021, 1:11 PM

## 2021-10-30 ENCOUNTER — Inpatient Hospital Stay: Payer: No Typology Code available for payment source

## 2021-10-30 LAB — BASIC METABOLIC PANEL
Anion gap: 7 (ref 5–15)
BUN: 57 mg/dL — ABNORMAL HIGH (ref 8–23)
CO2: 23 mmol/L (ref 22–32)
Calcium: 8.7 mg/dL — ABNORMAL LOW (ref 8.9–10.3)
Chloride: 110 mmol/L (ref 98–111)
Creatinine, Ser: 3.53 mg/dL — ABNORMAL HIGH (ref 0.61–1.24)
GFR, Estimated: 17 mL/min — ABNORMAL LOW (ref >60)
Glucose, Bld: 94 mg/dL (ref 70–99)
Potassium: 4.5 mmol/L (ref 3.5–5.1)
Sodium: 140 mmol/L (ref 135–145)

## 2021-10-30 LAB — SARS CORONAVIRUS 2 BY RT PCR: SARS Coronavirus 2 by RT PCR: NEGATIVE

## 2021-10-30 LAB — CBC
HCT: 29.9 % — ABNORMAL LOW (ref 39.0–52.0)
Hemoglobin: 9.6 g/dL — ABNORMAL LOW (ref 13.0–17.0)
MCH: 30.1 pg (ref 26.0–34.0)
MCHC: 32.1 g/dL (ref 30.0–36.0)
MCV: 93.7 fL (ref 80.0–100.0)
Platelets: 282 10*3/uL (ref 150–400)
RBC: 3.19 MIL/uL — ABNORMAL LOW (ref 4.22–5.81)
RDW: 13.6 % (ref 11.5–15.5)
WBC: 8 10*3/uL (ref 4.0–10.5)
nRBC: 0 % (ref 0.0–0.2)

## 2021-10-30 LAB — GLUCOSE, CAPILLARY
Glucose-Capillary: 112 mg/dL — ABNORMAL HIGH (ref 70–99)
Glucose-Capillary: 207 mg/dL — ABNORMAL HIGH (ref 70–99)
Glucose-Capillary: 98 mg/dL (ref 70–99)
Glucose-Capillary: 150 mg/dL — ABNORMAL HIGH (ref 70–99)

## 2021-10-31 LAB — BASIC METABOLIC PANEL
Anion gap: 6 (ref 5–15)
BUN: 54 mg/dL — ABNORMAL HIGH (ref 8–23)
CO2: 24 mmol/L (ref 22–32)
Calcium: 8.7 mg/dL — ABNORMAL LOW (ref 8.9–10.3)
Chloride: 111 mmol/L (ref 98–111)
Creatinine, Ser: 3.37 mg/dL — ABNORMAL HIGH (ref 0.61–1.24)
GFR, Estimated: 18 mL/min — ABNORMAL LOW (ref >60)
Glucose, Bld: 96 mg/dL (ref 70–99)
Potassium: 4.6 mmol/L (ref 3.5–5.1)
Sodium: 141 mmol/L (ref 135–145)

## 2021-10-31 LAB — CBC
HCT: 30.1 % — ABNORMAL LOW (ref 39.0–52.0)
Hemoglobin: 9.7 g/dL — ABNORMAL LOW (ref 13.0–17.0)
MCH: 29.9 pg (ref 26.0–34.0)
MCHC: 32.2 g/dL (ref 30.0–36.0)
MCV: 92.9 fL (ref 80.0–100.0)
Platelets: 307 10*3/uL (ref 150–400)
RBC: 3.24 MIL/uL — ABNORMAL LOW (ref 4.22–5.81)
RDW: 13.3 % (ref 11.5–15.5)
WBC: 7.2 10*3/uL (ref 4.0–10.5)
nRBC: 0 % (ref 0.0–0.2)

## 2021-10-31 LAB — GLUCOSE, CAPILLARY
Glucose-Capillary: 97 mg/dL (ref 70–99)
Glucose-Capillary: 121 mg/dL — ABNORMAL HIGH (ref 70–99)
Glucose-Capillary: 171 mg/dL — ABNORMAL HIGH (ref 70–99)
Glucose-Capillary: 209 mg/dL — ABNORMAL HIGH (ref 70–99)

## 2021-10-31 NOTE — TOC Progression Note (Addendum)
Transition of Care [facility]) - Progression Note    Patient Details  Name: Phillip Reyes MRN: [id number] Date of Birth: Dec 17, 1940  Transition of Care [facility]) CM/SW Contact  Maree Krabbe, LCSW Phone Number: 10/31/2021, 2:56 PM  Clinical Narrative:   [facility]. SNF can take pt tomorrow. MD updated. Pt aware and updated. Pt's daughter also aware and updated.    Expected Discharge Plan: Skilled Nursing Facility Barriers to Discharge: Continued Medical Work up, English as a second language teacher  Expected Discharge Plan and Services Expected Discharge Plan: Skilled Nursing Facility In-house Referral: Clinical Social Work   Post Acute Care Choice: Skilled Nursing Facility Living arrangements for the past 2 months: Single Family Home                                       Social Determinants of Health (SDOH) Interventions    Readmission Risk Interventions     No data to display

## 2021-10-31 NOTE — Progress Notes (Signed)
OT Cancellation Note  Patient Details Name: Gyan Poppen MRN: [id number] DOB: 01-Jan-1941   Cancelled Treatment:    Reason Eval/Treat Not Completed: Other (comment). Pt working with PT upon attempt. Will re-attempt OT tx at later date/time as pt is available and appropriate.   Arman Filter., MPH, MS, OTR/L ascom [phone] 10/31/21, 1:49 PM

## 2021-11-01 LAB — BASIC METABOLIC PANEL
Anion gap: 5 (ref 5–15)
BUN: 54 mg/dL — ABNORMAL HIGH (ref 8–23)
CO2: 24 mmol/L (ref 22–32)
Calcium: 8.7 mg/dL — ABNORMAL LOW (ref 8.9–10.3)
Chloride: 111 mmol/L (ref 98–111)
Creatinine, Ser: 3 mg/dL — ABNORMAL HIGH (ref 0.61–1.24)
GFR, Estimated: 20 mL/min — ABNORMAL LOW (ref >60)
Glucose, Bld: 87 mg/dL (ref 70–99)
Potassium: 4.5 mmol/L (ref 3.5–5.1)
Sodium: 140 mmol/L (ref 135–145)

## 2021-11-01 LAB — CBC
HCT: 28.7 % — ABNORMAL LOW (ref 39.0–52.0)
Hemoglobin: 9.4 g/dL — ABNORMAL LOW (ref 13.0–17.0)
MCH: 30.5 pg (ref 26.0–34.0)
MCHC: 32.8 g/dL (ref 30.0–36.0)
MCV: 93.2 fL (ref 80.0–100.0)
Platelets: 286 10*3/uL (ref 150–400)
RBC: 3.08 MIL/uL — ABNORMAL LOW (ref 4.22–5.81)
RDW: 13.3 % (ref 11.5–15.5)
WBC: 7.7 10*3/uL (ref 4.0–10.5)
nRBC: 0 % (ref 0.0–0.2)

## 2021-11-01 LAB — GLUCOSE, CAPILLARY
Glucose-Capillary: 98 mg/dL (ref 70–99)
Glucose-Capillary: 203 mg/dL — ABNORMAL HIGH (ref 70–99)

## 2021-11-01 MED ORDER — FERROUS SULFATE 325 (65 FE) MG PO TABS: 325.0000 mg | ORAL_TABLET | ORAL | 3 refills | Status: AC

## 2021-11-01 MED ORDER — INSULIN GLARGINE-YFGN 100 UNIT/ML ~~LOC~~ SOLN: 7.0000 [IU] | Freq: Every day | SUBCUTANEOUS | 11 refills | Status: DC

## 2021-11-01 NOTE — TOC Progression Note (Signed)
Transition of Care [facility]) - Progression Note    Patient Details  Name: Phillip Reyes MRN: [id number] Date of Birth: [birth year]  Transition [facility] Care [facility]) CM/SW Contact  Maree Krabbe, LCSW Phone Number: 11/01/2021, 9:12 AM  Clinical Narrative:   [facility] pt today.    Expected Discharge Plan: Skilled Nursing Facility Barriers to Discharge: Continued Medical Work up, English as a second language teacher  Expected Discharge Plan and Services Expected Discharge Plan: Skilled Nursing Facility In-house Referral: Clinical Social Work   Post Acute Care Choice: Skilled Nursing Facility Living arrangements for the past 2 months: Single Family Home                                       Social Determinants of Health (SDOH) Interventions    Readmission Risk Interventions     No data to display

## 2021-11-01 NOTE — TOC Progression Note (Addendum)
Transition of Care [facility]) - Progression Note    Patient Details  Name: Phillip Reyes MRN: [id number] Date of Birth: 04-05-1941  Transition of Care [facility]) CM/SW Contact  Maree Krabbe, LCSW Phone Number: 11/01/2021, 1:44 PM  Clinical Narrative:   CSW attempted to reach pt's spouse regarding transfer however voicemail full -unable to leave voicemail.     Expected Discharge Plan: Skilled Nursing Facility Barriers to Discharge: No Barriers Identified  Expected Discharge Plan and Services Expected Discharge Plan: Skilled Nursing Facility In-house Referral: Clinical Social Work   Post Acute Care Choice: Skilled Nursing Facility Living arrangements for the past 2 months: Single Family Home Expected Discharge Date: 11/01/21                                     Social Determinants of Health (SDOH) Interventions    Readmission Risk Interventions     No data to display

## 2022-04-15 ENCOUNTER — Inpatient Hospital Stay
Admission: EM | Admit: 2022-04-15 | Discharge: 2022-04-19 | DRG: 193 | Disposition: A | Discharge status: Skilled Nursing Facility | Payer: No Typology Code available for payment source | Source: Skilled Nursing Facility | Attending: Internal Medicine | Admitting: Internal Medicine

## 2022-04-15 ENCOUNTER — Emergency Department: Payer: No Typology Code available for payment source

## 2022-04-15 ENCOUNTER — Other Ambulatory Visit: Payer: Self-pay

## 2022-04-15 DIAGNOSIS — Z79891 Long term (current) use of opiate analgesic: Secondary | ICD-10-CM

## 2022-04-15 DIAGNOSIS — M1990 Unspecified osteoarthritis, unspecified site: Secondary | ICD-10-CM | POA: Diagnosis present

## 2022-04-15 DIAGNOSIS — Z1152 Encounter for screening for COVID-19: Secondary | ICD-10-CM

## 2022-04-15 DIAGNOSIS — N189 Chronic kidney disease, unspecified: Secondary | ICD-10-CM

## 2022-04-15 DIAGNOSIS — D631 Anemia in chronic kidney disease: Secondary | ICD-10-CM | POA: Insufficient documentation

## 2022-04-15 DIAGNOSIS — E1122 Type 2 diabetes mellitus with diabetic chronic kidney disease: Secondary | ICD-10-CM | POA: Diagnosis present

## 2022-04-15 DIAGNOSIS — J189 Pneumonia, unspecified organism: Secondary | ICD-10-CM | POA: Diagnosis not present

## 2022-04-15 DIAGNOSIS — E119 Type 2 diabetes mellitus without complications: Secondary | ICD-10-CM

## 2022-04-15 DIAGNOSIS — Z8673 Personal history of transient ischemic attack (TIA), and cerebral infarction without residual deficits: Secondary | ICD-10-CM

## 2022-04-15 DIAGNOSIS — I130 Hypertensive heart and chronic kidney disease with heart failure and stage 1 through stage 4 chronic kidney disease, or unspecified chronic kidney disease: Secondary | ICD-10-CM | POA: Diagnosis present

## 2022-04-15 DIAGNOSIS — Z79899 Other long term (current) drug therapy: Secondary | ICD-10-CM

## 2022-04-15 DIAGNOSIS — Z7982 Long term (current) use of aspirin: Secondary | ICD-10-CM

## 2022-04-15 DIAGNOSIS — N184 Chronic kidney disease, stage 4 (severe): Secondary | ICD-10-CM | POA: Diagnosis present

## 2022-04-15 DIAGNOSIS — N179 Acute kidney failure, unspecified: Secondary | ICD-10-CM | POA: Diagnosis present

## 2022-04-15 DIAGNOSIS — Z95810 Presence of automatic (implantable) cardiac defibrillator: Secondary | ICD-10-CM | POA: Diagnosis present

## 2022-04-15 DIAGNOSIS — I10 Essential (primary) hypertension: Secondary | ICD-10-CM | POA: Diagnosis present

## 2022-04-15 DIAGNOSIS — J9601 Acute respiratory failure with hypoxia: Secondary | ICD-10-CM | POA: Diagnosis present

## 2022-04-15 DIAGNOSIS — Z885 Allergy status to narcotic agent status: Secondary | ICD-10-CM

## 2022-04-15 DIAGNOSIS — Z794 Long term (current) use of insulin: Secondary | ICD-10-CM

## 2022-04-15 DIAGNOSIS — J440 Chronic obstructive pulmonary disease with acute lower respiratory infection: Secondary | ICD-10-CM | POA: Diagnosis present

## 2022-04-15 DIAGNOSIS — Z888 Allergy status to other drugs, medicaments and biological substances status: Secondary | ICD-10-CM

## 2022-04-15 DIAGNOSIS — I472 Ventricular tachycardia, unspecified: Secondary | ICD-10-CM | POA: Diagnosis not present

## 2022-04-15 DIAGNOSIS — J441 Chronic obstructive pulmonary disease with (acute) exacerbation: Secondary | ICD-10-CM | POA: Diagnosis present

## 2022-04-15 DIAGNOSIS — F17210 Nicotine dependence, cigarettes, uncomplicated: Secondary | ICD-10-CM | POA: Diagnosis present

## 2022-04-15 DIAGNOSIS — I5022 Chronic systolic (congestive) heart failure: Secondary | ICD-10-CM | POA: Diagnosis present

## 2022-04-15 DIAGNOSIS — E861 Hypovolemia: Secondary | ICD-10-CM | POA: Diagnosis present

## 2022-04-15 DIAGNOSIS — Z7951 Long term (current) use of inhaled steroids: Secondary | ICD-10-CM

## 2022-04-15 DIAGNOSIS — Z8249 Family history of ischemic heart disease and other diseases of the circulatory system: Secondary | ICD-10-CM

## 2022-04-15 LAB — COMPREHENSIVE METABOLIC PANEL
ALT: 11 U/L (ref 0–44)
AST: 13 U/L — ABNORMAL LOW (ref 15–41)
Albumin: 4 g/dL (ref 3.5–5.0)
Alkaline Phosphatase: 60 U/L (ref 38–126)
Anion gap: 12 (ref 5–15)
BUN: 86 mg/dL — ABNORMAL HIGH (ref 8–23)
CO2: 20 mmol/L — ABNORMAL LOW (ref 22–32)
Calcium: 8.7 mg/dL — ABNORMAL LOW (ref 8.9–10.3)
Chloride: 110 mmol/L (ref 98–111)
Creatinine, Ser: 4.92 mg/dL — ABNORMAL HIGH (ref 0.61–1.24)
GFR, Estimated: 11 mL/min — ABNORMAL LOW (ref >60)
Glucose, Bld: 160 mg/dL — ABNORMAL HIGH (ref 70–99)
Potassium: 4.8 mmol/L (ref 3.5–5.1)
Sodium: 142 mmol/L (ref 135–145)
Total Bilirubin: 0.6 mg/dL (ref 0.3–1.2)
Total Protein: 7.1 g/dL (ref 6.5–8.1)

## 2022-04-15 LAB — TROPONIN I (HIGH SENSITIVITY): Troponin I (High Sensitivity): 9 ng/L (ref <18)

## 2022-04-15 LAB — CBC WITH DIFFERENTIAL/PLATELET
Abs Immature Granulocytes: 0.01 10*3/uL (ref 0.00–0.07)
Basophils Absolute: 0.1 10*3/uL (ref 0.0–0.1)
Basophils Relative: 1 %
Eosinophils Absolute: 0.2 10*3/uL (ref 0.0–0.5)
Eosinophils Relative: 2 %
HCT: 37.7 % — ABNORMAL LOW (ref 39.0–52.0)
Hemoglobin: 11.9 g/dL — ABNORMAL LOW (ref 13.0–17.0)
Immature Granulocytes: 0 %
Lymphocytes Relative: 16 %
Lymphs Abs: 1.1 10*3/uL (ref 0.7–4.0)
MCH: 30.3 pg (ref 26.0–34.0)
MCHC: 31.6 g/dL (ref 30.0–36.0)
MCV: 95.9 fL (ref 80.0–100.0)
Monocytes Absolute: 0.4 10*3/uL (ref 0.1–1.0)
Monocytes Relative: 6 %
Neutro Abs: 5.3 10*3/uL (ref 1.7–7.7)
Neutrophils Relative %: 75 %
Platelets: 241 10*3/uL (ref 150–400)
RBC: 3.93 MIL/uL — ABNORMAL LOW (ref 4.22–5.81)
RDW: 14.1 % (ref 11.5–15.5)
WBC: 7.1 10*3/uL (ref 4.0–10.5)
nRBC: 0 % (ref 0.0–0.2)

## 2022-04-15 LAB — LIPASE, BLOOD: Lipase: 39 U/L (ref 11–51)

## 2022-04-15 LAB — PROTIME-INR
INR: 1 (ref 0.8–1.2)
Prothrombin Time: 13.4 seconds (ref 11.4–15.2)

## 2022-04-15 LAB — BRAIN NATRIURETIC PEPTIDE: B Natriuretic Peptide: 274.2 pg/mL — ABNORMAL HIGH (ref 0.0–100.0)

## 2022-04-15 NOTE — ED Notes (Signed)
Patient placed on 2L O2 via [place] for labored breathing. Pulse ox was 94%.

## 2022-04-15 NOTE — ED Triage Notes (Signed)
Patient to Rm 24 via EMS from local nsg home ([facility]) per EMS staff called because of kidney failure and abnormal labs.  On their arrival EMS was told that patient's O2 stats were in the 70's-80's and was placed on 5 liters via nasal cannula and he was 93%.  EMS interventions --  Saline loc via 20 gauge angiocath to left hand, received duoneb with albuterol, solumedral '125mg'$  via IV, cbg 177, bp 143/55.    Per patient he has had a cough for about 1 week, shortness of breath worsened when laying down tonight.  Patient has been at [facility] for only 2 days.

## 2022-04-15 NOTE — ED Provider Notes (Signed)
[facility] Provider Note    Event Date/Time   First MD Initiated Contact with Patient 04/15/22 2312     (approximate)   History   Shortness of Breath and Kidney Failure and Abnormal Labs   HPI  Phillip Reyes is a 81 y.o. male  with medical history significant for DM, HTN, CKD 4 with chronic anemia, COPD, HTN, chronic systolic heart failure, who comes from [facility] by EMS for evaluation of shortness of breath.  History from the patient and EMS is limited.  Reportedly EMS was called because of "kidney failure and abnormal labs".  When EMS arrived, they were told that the patient's SpO2 was in the 70s to 80s.  The patient was placed on 5 L of oxygen and was up to 93%.  EMS provided a DuoNeb, Solu-Medrol 125 mg IV and route to the hospital.  The patient states that he has been gradually getting more short of breath over the last week.  He said he feels like he is full of fluid.  He has had a cough but only occasionally coughs up anything but he feels like he has to spit more often than usual.  He has a headache but no chest pain.  He said he feels like he does when his heart failure is being a problem.  He believes he has been taking all of his medications.  He has only been at [facility] for 2 days and was at home before that.  He is not sure if he takes warfarin or not.     Physical Exam   Triage Vital Signs: ED Triage Vitals  Enc Vitals Group     BP 04/15/22 2256 (!) 133/51     Pulse Rate 04/15/22 2256 62     Resp 04/15/22 2256 20     Temp 04/15/22 2256 (!) 97.4 F (36.3 C)     Temp Source 04/15/22 2256 Axillary     SpO2 04/15/22 2256 97 %     Weight 04/15/22 2259 71.2 kg (157 lb)     Height 04/15/22 2259 1.727 m ('5\' 8"'$ )     Head Circumference --      Peak Flow --      Pain Score 04/15/22 2257 0     Pain Loc --      Pain Edu? --      Excl. in [place]? --     Most recent vital signs: Vitals:   04/15/22 2300 04/15/22 2315  BP: (!) 133/55    Pulse: 62 63  Resp: (!) 27 (!) 22  Temp:    SpO2: 96% 95%     General: Awake, appears chronically ill and in mild respiratory distress. CV:  Good peripheral perfusion but pale.  Regular rate and rhythm.  Normal heart sounds. Resp:  Increased respiratory rate and effort with mild accessory muscle usage and intercostal retractions.  Lungs, particular on the left, sound coarse and wet.  Right lung is more clear.  Patient starts to cough when taking deep breaths.  No audible wheezes. Abd:  No distention.  No tenderness to palpation. Other:  Patient is awake and alert, seems oriented and appropriate and spite of his illness.   ED Results / Procedures / Treatments   Labs (all labs ordered are listed, but only abnormal results are displayed) Labs Reviewed  CBC WITH DIFFERENTIAL/PLATELET  COMPREHENSIVE METABOLIC PANEL  LIPASE, BLOOD  BLOOD GAS, VENOUS  BRAIN NATRIURETIC PEPTIDE  TROPONIN I (HIGH  SENSITIVITY)     EKG  ED ECG REPORT I, Hinda Kehr, the attending physician, personally viewed and interpreted this ECG.  Date: 04/15/2022 EKG Time: 23: 27 Rate: 63 Rhythm: normal sinus rhythm (computer is interpreting it as complete third-degree heart block but I believe this to be abnormal) QRS Axis: normal Intervals: Left bundle branch block ST/T Wave abnormalities: Non-specific ST segment / T-wave changes, but no clear evidence of acute ischemia. Narrative Interpretation: no definitive evidence of acute ischemia; does not meet STEMI criteria.  Computer is interpreting it as complete third-degree heart block but I think this is due to some artifact and poor tracing is due to the patient's inability to hold still.  I can appreciate P waves most notable in leads II and V5 on the rhythm strip, and there seems to be a P wave before every QRS complex    RADIOLOGY I viewed and interpreted the patient's 1 view chest x-ray.  See hospital course for details: Question atypical infection  versus atelectasis versus pulmonary vascular congestion    PROCEDURES:  Critical Care performed: Yes, see critical care procedure note(s)  .1-3 Lead EKG Interpretation  Performed by: Hinda Kehr, MD Authorized by: Hinda Kehr, MD     Interpretation: normal     ECG rate:  62   ECG rate assessment: normal     Rhythm: sinus rhythm     Ectopy: none     Conduction: normal   .Critical Care  Performed by: Hinda Kehr, MD Authorized by: Hinda Kehr, MD   Critical care provider statement:    Critical care time (minutes):  30   Critical care time was exclusive of:  Separately billable procedures and treating other patients   Critical care was necessary to treat or prevent imminent or life-threatening deterioration of the following conditions:  Respiratory failure and cardiac failure   Critical care was time spent personally by me on the following activities:  Development of treatment plan with patient or surrogate, evaluation of patient's response to treatment, examination of patient, obtaining history from patient or surrogate, ordering and performing treatments and interventions, ordering and review of laboratory studies, ordering and review of radiographic studies, pulse oximetry, re-evaluation of patient's condition and review of old charts    MEDICATIONS ORDERED IN ED: Medications - No data to display   IMPRESSION / MDM / [place] / ED COURSE  I reviewed the triage vital signs and the nursing notes.                              Differential diagnosis includes, but is not limited to, CHF exacerbation, renal failure, other electrolyte or metabolic abnormality, acute respiratory infection (viral versus healthcare associated pneumonia), ACS, less likely PE.  Patient's presentation is most consistent with acute presentation with potential threat to life or bodily function.  Patient is ill-appearing at baseline.  Vital signs are generally reassuring at this time  although he has a little bit of tachypnea and he was hypoxic prior to arrival.  We trialed him off of oxygen here and he quickly desatted to about 90% and his work of breathing increased so he is back on 2 L of oxygen by nasal cannula with oxygen saturations in the mid to upper 90s.  Labs/studies ordered: CBC with differential, respiratory viral panel, CMP, lipase, high-sensitivity troponin, BNP, VBG.  Because of the confusion about whether or not he is on warfarin (not supported by the  computer but suggested by the patient), I also ordered a pro time-INR to see if he has coagulopathy.  I also ordered 1 view chest x-ray and EKG.  The patient is on the cardiac monitor to evaluate for evidence of arrhythmia and/or significant heart rate changes.  Patient is in mild respiratory distress but stable without the need for BiPAP at this time.  He is comfortable waiting for additional evaluation without requiring intervention at this time.  Clinical Course as of 04/16/22 0115  Sat Apr 15, 2022  2351 CBC with Differential(!) Normal CBC, no leukocytosis, no significant anemia [CF]  Sun Apr 16, 2022  0020 Blood gas, venous(!) Reassuring VBG, no significant hypercapnia [CF]  0020 Troponin I (High Sensitivity): 9 [CF]  0021 B Natriuretic Peptide(!): 274.2 Mildly elevated BNP [CF]  0021 INR: 1.0 Normal INR doubt warfarin therapy. [CF]  0021 Comprehensive metabolic panel(!) Metabolic panel notable for worsening renal failure.  Although he has chronic kidney disease, his creatinine is up to nearly 5 with a BUN of 86 which represents a drop in his GFR from 20 down to 11. [CF]  0021 DG Chest Portable 1 View I viewed and interpreted the patient's 1 view chest x-ray.  There is some haziness bilaterally which could represent atelectasis, pulmonary vascular congestion, or infection.  Radiologist agrees atypical infection versus atelectasis.  However, the patient has no evidence of an infectious process and it  makes me lean more towards the probability of volume overload.  I will hold off on empiric antibiotics at this time given the patient has no tachycardia, no fever, and no leukocytosis. [CF]  0032 Resp panel by RT-PCR (RSV, Flu A&B, Covid) Anterior Nasal Swab Negative respiratory viral panel [CF]  0033 In consideration, given the patient's age and comorbidities, gradually worsening symptoms over a week, etc., I will err on the side of caution and order empiric antibiotics with ceftriaxone 1 g IV and azithromycin 500 mg IV.  I have ordered blood cultures to be obtained prior to the antibiotics.  Of note, however, the patient still does not meet sepsis criteria. [CF]  [phone] Consulting hospitalist for admission for his acute hypoxic respiratory failure likely due to either CHF exacerbation or atypical pneumonia, in addition to acute on chronic kidney failure. [CF]  0114 Consulted by phone with Dr. Damita Dunnings with the hospitalist service who will admit the patient for further management. [CF]    Clinical Course User Index [CF] Hinda Kehr, MD     FINAL CLINICAL IMPRESSION(S) / ED DIAGNOSES   Final diagnoses:  None     Rx / DC Orders   ED Discharge Orders     None        Note:  This document was prepared using Dragon voice recognition software and may include unintentional dictation errors.   Hinda Kehr, MD 04/16/22 [phone]

## 2022-04-16 DIAGNOSIS — M1990 Unspecified osteoarthritis, unspecified site: Secondary | ICD-10-CM | POA: Diagnosis present

## 2022-04-16 DIAGNOSIS — J9601 Acute respiratory failure with hypoxia: Secondary | ICD-10-CM | POA: Diagnosis present

## 2022-04-16 DIAGNOSIS — N179 Acute kidney failure, unspecified: Secondary | ICD-10-CM | POA: Diagnosis present

## 2022-04-16 DIAGNOSIS — Z79891 Long term (current) use of opiate analgesic: Secondary | ICD-10-CM | POA: Diagnosis not present

## 2022-04-16 DIAGNOSIS — Z8249 Family history of ischemic heart disease and other diseases of the circulatory system: Secondary | ICD-10-CM | POA: Diagnosis not present

## 2022-04-16 DIAGNOSIS — J440 Chronic obstructive pulmonary disease with acute lower respiratory infection: Secondary | ICD-10-CM | POA: Diagnosis present

## 2022-04-16 DIAGNOSIS — Z8673 Personal history of transient ischemic attack (TIA), and cerebral infarction without residual deficits: Secondary | ICD-10-CM | POA: Diagnosis not present

## 2022-04-16 DIAGNOSIS — D631 Anemia in chronic kidney disease: Secondary | ICD-10-CM | POA: Insufficient documentation

## 2022-04-16 DIAGNOSIS — J189 Pneumonia, unspecified organism: Secondary | ICD-10-CM | POA: Diagnosis present

## 2022-04-16 DIAGNOSIS — Z95810 Presence of automatic (implantable) cardiac defibrillator: Secondary | ICD-10-CM | POA: Diagnosis not present

## 2022-04-16 DIAGNOSIS — Z885 Allergy status to narcotic agent status: Secondary | ICD-10-CM | POA: Diagnosis not present

## 2022-04-16 DIAGNOSIS — J441 Chronic obstructive pulmonary disease with (acute) exacerbation: Secondary | ICD-10-CM | POA: Diagnosis present

## 2022-04-16 DIAGNOSIS — Z7982 Long term (current) use of aspirin: Secondary | ICD-10-CM | POA: Diagnosis not present

## 2022-04-16 DIAGNOSIS — Z7951 Long term (current) use of inhaled steroids: Secondary | ICD-10-CM | POA: Diagnosis not present

## 2022-04-16 DIAGNOSIS — N184 Chronic kidney disease, stage 4 (severe): Secondary | ICD-10-CM | POA: Diagnosis present

## 2022-04-16 DIAGNOSIS — E861 Hypovolemia: Secondary | ICD-10-CM | POA: Diagnosis present

## 2022-04-16 DIAGNOSIS — Z888 Allergy status to other drugs, medicaments and biological substances status: Secondary | ICD-10-CM | POA: Diagnosis not present

## 2022-04-16 DIAGNOSIS — I130 Hypertensive heart and chronic kidney disease with heart failure and stage 1 through stage 4 chronic kidney disease, or unspecified chronic kidney disease: Secondary | ICD-10-CM | POA: Diagnosis present

## 2022-04-16 DIAGNOSIS — I472 Ventricular tachycardia, unspecified: Secondary | ICD-10-CM | POA: Diagnosis not present

## 2022-04-16 DIAGNOSIS — F17210 Nicotine dependence, cigarettes, uncomplicated: Secondary | ICD-10-CM | POA: Diagnosis present

## 2022-04-16 DIAGNOSIS — E1122 Type 2 diabetes mellitus with diabetic chronic kidney disease: Secondary | ICD-10-CM | POA: Diagnosis present

## 2022-04-16 DIAGNOSIS — Z1152 Encounter for screening for COVID-19: Secondary | ICD-10-CM | POA: Diagnosis not present

## 2022-04-16 DIAGNOSIS — Z794 Long term (current) use of insulin: Secondary | ICD-10-CM | POA: Diagnosis not present

## 2022-04-16 DIAGNOSIS — I5022 Chronic systolic (congestive) heart failure: Secondary | ICD-10-CM | POA: Diagnosis present

## 2022-04-16 LAB — CBC
HCT: 33.5 % — ABNORMAL LOW (ref 39.0–52.0)
Hemoglobin: 10.7 g/dL — ABNORMAL LOW (ref 13.0–17.0)
MCH: 30.5 pg (ref 26.0–34.0)
MCHC: 31.9 g/dL (ref 30.0–36.0)
MCV: 95.4 fL (ref 80.0–100.0)
Platelets: 184 10*3/uL (ref 150–400)
RBC: 3.51 MIL/uL — ABNORMAL LOW (ref 4.22–5.81)
RDW: 13.9 % (ref 11.5–15.5)
WBC: 11.4 10*3/uL — ABNORMAL HIGH (ref 4.0–10.5)
nRBC: 0 % (ref 0.0–0.2)

## 2022-04-16 LAB — BASIC METABOLIC PANEL
Anion gap: 11 (ref 5–15)
BUN: 88 mg/dL — ABNORMAL HIGH (ref 8–23)
CO2: 18 mmol/L — ABNORMAL LOW (ref 22–32)
Calcium: 8 mg/dL — ABNORMAL LOW (ref 8.9–10.3)
Chloride: 110 mmol/L (ref 98–111)
Creatinine, Ser: 4.83 mg/dL — ABNORMAL HIGH (ref 0.61–1.24)
GFR, Estimated: 11 mL/min — ABNORMAL LOW (ref >60)
Glucose, Bld: 363 mg/dL — ABNORMAL HIGH (ref 70–99)
Potassium: 4.8 mmol/L (ref 3.5–5.1)
Sodium: 139 mmol/L (ref 135–145)

## 2022-04-16 LAB — BLOOD GAS, VENOUS
Acid-base deficit: 5 mmol/L — ABNORMAL HIGH (ref 0.0–2.0)
Bicarbonate: 22.4 mmol/L (ref 20.0–28.0)
O2 Saturation: 61.2 %
Patient temperature: 37
pCO2, Ven: 50 mmHg (ref 44–60)
pH, Ven: 7.26 (ref 7.25–7.43)
pO2, Ven: 40 mmHg (ref 32–45)

## 2022-04-16 LAB — RESP PANEL BY RT-PCR (RSV, FLU A&B, COVID)  RVPGX2
Influenza A by PCR: NEGATIVE
Influenza B by PCR: NEGATIVE
Resp Syncytial Virus by PCR: NEGATIVE
SARS Coronavirus 2 by RT PCR: NEGATIVE

## 2022-04-16 LAB — TROPONIN I (HIGH SENSITIVITY): Troponin I (High Sensitivity): 7 ng/L (ref <18)

## 2022-04-16 LAB — CBG MONITORING, ED
Glucose-Capillary: 359 mg/dL — ABNORMAL HIGH (ref 70–99)
Glucose-Capillary: 322 mg/dL — ABNORMAL HIGH (ref 70–99)
Glucose-Capillary: 358 mg/dL — ABNORMAL HIGH (ref 70–99)
Glucose-Capillary: 252 mg/dL — ABNORMAL HIGH (ref 70–99)
Glucose-Capillary: 124 mg/dL — ABNORMAL HIGH (ref 70–99)

## 2022-04-16 LAB — PROCALCITONIN: Procalcitonin: 2.03 ng/mL

## 2022-04-16 MED ORDER — ENOXAPARIN SODIUM 30 MG/0.3ML IJ SOSY: 30.0000 mg | PREFILLED_SYRINGE | INTRAMUSCULAR | Status: DC

## 2022-04-16 MED ORDER — SODIUM CHLORIDE 0.9 % IV SOLN
500.0000 mg | INTRAVENOUS | Status: DC
  Administered 2022-04-17 – 2022-04-19 (×3): 500 mg via INTRAVENOUS
  Filled 2022-04-16: qty 5
  Filled 2022-04-16: qty 500
  Filled 2022-04-16 (×2): qty 5

## 2022-04-16 MED ORDER — INSULIN GLARGINE-YFGN 100 UNIT/ML ~~LOC~~ SOLN
7.0000 [IU] | Freq: Two times a day (BID) | SUBCUTANEOUS | Status: DC
  Administered 2022-04-16 – 2022-04-17 (×3): 7 [IU] via SUBCUTANEOUS
  Filled 2022-04-16 (×4): qty 0.07

## 2022-04-16 MED ORDER — CEFTRIAXONE SODIUM 1 G IJ SOLR
1.0000 g | Freq: Once | INTRAMUSCULAR | Status: AC
  Administered 2022-04-16: 1 g via INTRAVENOUS
  Filled 2022-04-16: qty 10

## 2022-04-16 MED ORDER — ALBUTEROL SULFATE (2.5 MG/3ML) 0.083% IN NEBU: 2.5000 mg | INHALATION_SOLUTION | RESPIRATORY_TRACT | Status: DC | PRN

## 2022-04-16 MED ORDER — CARVEDILOL 3.125 MG PO TABS
6.2500 mg | ORAL_TABLET | Freq: Two times a day (BID) | ORAL | Status: DC
  Administered 2022-04-17 – 2022-04-19 (×6): 6.25 mg via ORAL
  Filled 2022-04-16 (×2): qty 1
  Filled 2022-04-16: qty 2
  Filled 2022-04-16 (×3): qty 1
  Filled 2022-04-16 (×2): qty 2
  Filled 2022-04-16: qty 1

## 2022-04-16 MED ORDER — INSULIN ASPART 100 UNIT/ML IJ SOLN
0.0000 [IU] | Freq: Three times a day (TID) | INTRAMUSCULAR | Status: DC
  Administered 2022-04-16: 8 [IU] via SUBCUTANEOUS
  Administered 2022-04-16: 11 [IU] via SUBCUTANEOUS
  Administered 2022-04-16: 15 [IU] via SUBCUTANEOUS
  Administered 2022-04-17: 3 [IU] via SUBCUTANEOUS
  Administered 2022-04-19: 2 [IU] via SUBCUTANEOUS
  Filled 2022-04-16 (×5): qty 1

## 2022-04-16 MED ORDER — SODIUM CHLORIDE 0.9 % IV SOLN
1.0000 g | Freq: Once | INTRAVENOUS | Status: AC
  Administered 2022-04-16: 1 g via INTRAVENOUS
  Filled 2022-04-16: qty 10

## 2022-04-16 MED ORDER — ASPIRIN 81 MG PO TBEC
81.0000 mg | DELAYED_RELEASE_TABLET | Freq: Every day | ORAL | Status: DC
  Administered 2022-04-16 – 2022-04-19 (×4): 81 mg via ORAL
  Filled 2022-04-16 (×4): qty 1

## 2022-04-16 MED ORDER — INSULIN ASPART 100 UNIT/ML IJ SOLN
0.0000 [IU] | Freq: Every day | INTRAMUSCULAR | Status: DC
  Administered 2022-04-16: 5 [IU] via SUBCUTANEOUS
  Filled 2022-04-16: qty 1

## 2022-04-16 MED ORDER — TAMSULOSIN HCL 0.4 MG PO CAPS
0.8000 mg | ORAL_CAPSULE | Freq: Every day | ORAL | Status: DC
  Administered 2022-04-16 – 2022-04-19 (×4): 0.8 mg via ORAL
  Filled 2022-04-16 (×4): qty 2

## 2022-04-16 MED ORDER — SODIUM CHLORIDE 0.9 % IV SOLN
2.0000 g | INTRAVENOUS | Status: DC
  Administered 2022-04-17 – 2022-04-19 (×3): 2 g via INTRAVENOUS
  Filled 2022-04-16 (×2): qty 20
  Filled 2022-04-16: qty 2
  Filled 2022-04-16: qty 20

## 2022-04-16 MED ORDER — IPRATROPIUM-ALBUTEROL 0.5-2.5 (3) MG/3ML IN SOLN
3.0000 mL | Freq: Four times a day (QID) | RESPIRATORY_TRACT | Status: DC
  Administered 2022-04-16 – 2022-04-19 (×14): 3 mL via RESPIRATORY_TRACT
  Filled 2022-04-16 (×14): qty 3

## 2022-04-16 MED ORDER — SODIUM CHLORIDE 0.9 % IV BOLUS
250.0000 mL | Freq: Once | INTRAVENOUS | Status: AC
  Administered 2022-04-16: 250 mL via INTRAVENOUS

## 2022-04-16 MED ORDER — HEPARIN SODIUM (PORCINE) 5000 UNIT/ML IJ SOLN
5000.0000 [IU] | Freq: Three times a day (TID) | INTRAMUSCULAR | Status: DC
  Administered 2022-04-16 – 2022-04-19 (×11): 5000 [IU] via SUBCUTANEOUS
  Filled 2022-04-16 (×11): qty 1

## 2022-04-16 MED ORDER — CITALOPRAM HYDROBROMIDE 10 MG PO TABS
20.0000 mg | ORAL_TABLET | Freq: Every day | ORAL | Status: DC
  Administered 2022-04-16 – 2022-04-19 (×4): 20 mg via ORAL
  Filled 2022-04-16 (×3): qty 1
  Filled 2022-04-16: qty 2

## 2022-04-16 MED ORDER — ONDANSETRON HCL 4 MG/2ML IJ SOLN: 4.0000 mg | Freq: Four times a day (QID) | INTRAMUSCULAR | Status: DC | PRN

## 2022-04-16 MED ORDER — INSULIN GLARGINE-YFGN 100 UNIT/ML ~~LOC~~ SOLN: 7.0000 [IU] | Freq: Every day | SUBCUTANEOUS | Status: DC

## 2022-04-16 MED ORDER — SODIUM CHLORIDE 0.9 % IV SOLN
500.0000 mg | Freq: Once | INTRAVENOUS | Status: AC
  Administered 2022-04-16: 500 mg via INTRAVENOUS
  Filled 2022-04-16: qty 5

## 2022-04-16 MED ORDER — ONDANSETRON HCL 4 MG PO TABS: 4.0000 mg | ORAL_TABLET | Freq: Four times a day (QID) | ORAL | Status: DC | PRN

## 2022-04-16 MED ORDER — ACETAMINOPHEN 650 MG RE SUPP: 650.0000 mg | Freq: Four times a day (QID) | RECTAL | Status: DC | PRN

## 2022-04-16 MED ORDER — ATORVASTATIN CALCIUM 20 MG PO TABS
80.0000 mg | ORAL_TABLET | Freq: Every day | ORAL | Status: DC
  Administered 2022-04-16 – 2022-04-19 (×4): 80 mg via ORAL
  Filled 2022-04-16 (×4): qty 4

## 2022-04-16 MED ORDER — INSULIN ASPART 100 UNIT/ML IJ SOLN
4.0000 [IU] | Freq: Three times a day (TID) | INTRAMUSCULAR | Status: DC
  Administered 2022-04-16 – 2022-04-17 (×3): 4 [IU] via SUBCUTANEOUS
  Filled 2022-04-16 (×3): qty 1

## 2022-04-16 MED ORDER — ACETAMINOPHEN 325 MG PO TABS: 650.0000 mg | ORAL_TABLET | Freq: Four times a day (QID) | ORAL | Status: DC | PRN

## 2022-04-16 NOTE — Assessment & Plan Note (Signed)
Hemoglobin 11.9, improved from usual baseline

## 2022-04-16 NOTE — Assessment & Plan Note (Signed)
Blood sugar with fair control Continue basal insulin with sliding scale coverage

## 2022-04-16 NOTE — Assessment & Plan Note (Addendum)
Patient presented with shortness of breath requiring O2 at 5 L for O2 sat 70s to 80s, now down to 2 L History of intubation February 2023 for acute respiratory failure of multifactorial etiology Current etiology probably related to CAP/acute bronchitis/COPD exacerbation in view of cough, with possibility of CHF given elevated BNP Elevated creatinine precludes CTA chest to evaluate for PE but given atypical pneumonia on chest x-ray, low suspicion for PE  supplemental O2 to keep sats over 92% Treat CAP and COPD as outlined below Will get echo to evaluate LVEF

## 2022-04-16 NOTE — H&P (Signed)
History and Physical    Patient: Phillip Reyes HMC:[id number] DOB: [birth year] DOA: 04/15/2022 DOS: the patient was seen and examined on 04/16/2022 PCP: [facility] [place]  Patient coming from: Home  Chief Complaint:  Chief Complaint  Patient presents with   Shortness of Breath   Kidney Failure and Abnormal Labs    HPI: Phillip Reyes is a 81 y.o. male with medical history significant for DM, HTN, CKD 4 with chronic anemia, COPD, HTN, chronic systolic heart failure who was brought by EMS for evaluation of shortness of breath.  Patient was sent from [facility] where he has been for the last 2 days and the call out to EMS was initially for abnormal labs.  On arrival of EMS they found his O2 sat to be in the 70s to 80s and was placed on O2 at 5 L with improvement to 93%. Patient reports he started developing shortness of breath about a week prior and it has progressively worsened and spite of being compliant with his medication.  He has a congested cough but no fevers or chills.  He denies chest pain or palpitations.  Denies lower extremity pain. Patient received DuoNebs and Solu-Medrol and route ED course and data review: Afebrile with BP 133/51 and pulse 62 with O2 sat 97% on 5 L.  Respirations 22.   Labs: VBG unremarkable.  troponin 9 and BNP 274.  WBC normal at 7100 and COVID and flu negative.  Hemoglobin 11.9, better than baseline of 9.4,Creatinine 4.92 up from baseline of 3 EKG, personally viewed and interpreted: Showing sinus at 63 with LBBB and no acute ischemic ST-T wave changes. Chest x-ray showing ill-defined opacities in the right midlung and both lower lung zones, atelectasis or possible atypical infection Patient was able to be weaned from 5 L O2 to 2 L with treatment in the ED which included Rocephin and azithromycin for possible atypical infection.  Hospitalist consulted for admission.   Review of Systems: As mentioned in the history of present illness. All other  systems reviewed and are negative.  Past Medical History:  Diagnosis Date   Arthritis    Cancer ([place])    COPD (chronic obstructive pulmonary disease) ([place])    Diabetes mellitus without complication ([place])    Hypertension    Renal disorder    Stroke [facility])    Past Surgical History:  Procedure Laterality Date   APPENDECTOMY     INTRAMEDULLARY (IM) NAIL INTERTROCHANTERIC Right 09/15/2021   Procedure: INTRAMEDULLARY (IM) NAIL INTERTROCHANTRIC;  Surgeon: Hessie Knows, MD;  Location: [facility] ORS;  Service: Orthopedics;  Laterality: Right;   THROAT SURGERY     Social History:  reports that he has been smoking cigarettes. He has a 30.50 pack-year smoking history. He has never used smokeless tobacco. He reports that he does not drink alcohol and does not use drugs.  Allergies  Allergen Reactions   Norco [Hydrocodone-Acetaminophen] Shortness Of Breath   Hydrochlorothiazide Other (See Comments)   Metformin Other (See Comments)   Terazosin Other (See Comments)    Family History  Problem Relation Age of Onset   Congestive Heart Failure Father    Other Mother        unknown medical history    Prior to Admission medications   Medication Sig Start Date End Date Taking? Authorizing Provider  acetaminophen (TYLENOL) 500 MG tablet Take 2 tablets (1,000 mg total) by mouth 3 (three) times daily as needed for mild pain, moderate pain or headache. 09/19/21   Arvella Nigh,  Marijo Conception, PA-C  aspirin EC 81 MG tablet Take 81 mg by mouth daily.    [provider]  atorvastatin (LIPITOR) 80 MG tablet Take 80 mg by mouth at bedtime.    [provider]  azelastine (ASTELIN) 0.1 % nasal spray Place 1 spray into both nostrils 2 (two) times daily as needed for rhinitis.    [provider]  budesonide-formoterol (SYMBICORT) 160-4.5 MCG/ACT inhaler Inhale 2 puffs into the lungs 2 (two) times daily.    [provider]  carvedilol (COREG) 6.25 MG tablet Take 1 tablet (6.25 mg total) by  mouth 2 (two) times daily with a meal. 09/23/21   Antonieta Pert, MD  citalopram (CELEXA) 20 MG tablet Take 20 mg by mouth daily.    [provider]  docusate sodium (COLACE) 100 MG capsule Take 1 capsule (100 mg total) by mouth 2 (two) times daily. 09/23/21   Antonieta Pert, MD  ferrous sulfate 325 (65 FE) MG tablet Take 1 tablet (325 mg total) by mouth every other day. 11/03/21   Wouk, Ailene Rud, MD  gabapentin (NEURONTIN) 300 MG capsule Take 300 mg by mouth daily.     [provider]  insulin glargine-yfgn (SEMGLEE) 100 UNIT/ML injection Inject 0.07 mLs (7 Units total) into the skin at bedtime. 11/01/21   Wouk, Ailene Rud, MD  ipratropium-albuterol (DUONEB) 0.5-2.5 (3) MG/3ML SOLN Take 3 mLs by nebulization every 6 (six) hours as needed.    [provider]  tamsulosin (FLOMAX) 0.4 MG CAPS capsule Take 0.8 mg by mouth at bedtime.    [provider]  traZODone (DESYREL) 50 MG tablet Take 50 mg by mouth at bedtime.    [provider]    Physical Exam: Vitals:   04/15/22 2259 04/15/22 2300 04/15/22 2315 04/16/22 0115  BP:  (!) 133/55  (!) 141/62  Pulse:  62 63 65  Resp:  (!) 27 (!) 22 (!) 22  Temp:      TempSrc:      SpO2:  96% 95% 98%  Weight: 71.2 kg     Height: '5\' 8"'$  (1.727 m)      Physical Exam Vitals and nursing note reviewed.  Constitutional:      Comments: Chronically ill-appearing male  HENT:     Head: Normocephalic and atraumatic.  Cardiovascular:     Rate and Rhythm: Normal rate and regular rhythm.     Heart sounds: Normal heart sounds.  Pulmonary:     Effort: Tachypnea present.     Breath sounds: Wheezing present.  Abdominal:     Palpations: Abdomen is soft.     Tenderness: There is no abdominal tenderness.  Neurological:     Mental Status: Mental status is at baseline.     Labs on Admission: I have personally reviewed following labs and imaging studies  CBC: Recent Labs  Lab 04/15/22 2316  WBC 7.1  NEUTROABS 5.3  HGB  11.9*  HCT 37.7*  MCV 95.9  PLT 497   Basic Metabolic Panel: Recent Labs  Lab 04/15/22 2316  NA 142  K 4.8  CL 110  CO2 20*  GLUCOSE 160*  BUN 86*  CREATININE 4.92*  CALCIUM 8.7*   GFR: Estimated Creatinine Clearance: 11.4 mL/min (A) (by C-G formula based on SCr of 4.92 mg/dL (H)). Liver Function Tests: Recent Labs  Lab 04/15/22 2316  AST 13*  ALT 11  ALKPHOS 60  BILITOT 0.6  PROT 7.1  ALBUMIN 4.0   Recent Labs  Lab 04/15/22  2316  LIPASE 39   No results for input(s): "AMMONIA" in the last 168 hours. Coagulation Profile: Recent Labs  Lab 04/15/22 2316  INR 1.0   Cardiac Enzymes: No results for input(s): "CKTOTAL", "CKMB", "CKMBINDEX", "TROPONINI" in the last 168 hours. BNP (last 3 results) No results for input(s): "PROBNP" in the last 8760 hours. HbA1C: No results for input(s): "HGBA1C" in the last 72 hours. CBG: No results for input(s): "GLUCAP" in the last 168 hours. Lipid Profile: No results for input(s): "CHOL", "HDL", "LDLCALC", "TRIG", "CHOLHDL", "LDLDIRECT" in the last 72 hours. Thyroid Function Tests: No results for input(s): "TSH", "T4TOTAL", "FREET4", "T3FREE", "THYROIDAB" in the last 72 hours. Anemia Panel: No results for input(s): "VITAMINB12", "FOLATE", "FERRITIN", "TIBC", "IRON", "RETICCTPCT" in the last 72 hours. Urine analysis:    Component Value Date/Time   COLORURINE STRAW (A) 10/28/2021 0635   APPEARANCEUR CLEAR (A) 10/28/2021 0635   APPEARANCEUR Clear 02/17/2013 0013   LABSPEC 1.006 10/28/2021 0635   LABSPEC 1.029 02/17/2013 0013   PHURINE 5.0 10/28/2021 0635   GLUCOSEU NEGATIVE 10/28/2021 0635   GLUCOSEU >=500 02/17/2013 0013   HGBUR NEGATIVE 10/28/2021 0635   BILIRUBINUR NEGATIVE 10/28/2021 0635   BILIRUBINUR Negative 02/17/2013 0013   KETONESUR NEGATIVE 10/28/2021 0635   PROTEINUR NEGATIVE 10/28/2021 0635   NITRITE NEGATIVE 10/28/2021 0635   LEUKOCYTESUR NEGATIVE 10/28/2021 0635   LEUKOCYTESUR Negative 02/17/2013 0013     Radiological Exams on Admission: DG Chest Portable 1 View  Result Date: 04/15/2022 CLINICAL DATA:  Acute hypoxia. Dyspnea. Cough for 1 week. 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 Stable heart size and mediastinal contours. Mild diffuse interstitial coarsening with slight ill-defined opacities in the right mid lung and both lower lung zones. No pleural effusion or pneumothorax. No acute osseous findings. IMPRESSION: Ill-defined opacities in the right mid lung and both lower lung zones, atelectasis or possible atypical infection. Electronically Signed   By: Keith Rake M.D.   On: 04/15/2022 23:45     Data Reviewed: Relevant notes from primary care and specialist visits, past discharge summaries as available in EHR, including Care Everywhere. Prior diagnostic testing as pertinent to current admission diagnoses Updated medications and problem lists for reconciliation ED course, including vitals, labs, imaging, treatment and response to treatment Triage notes, nursing and pharmacy notes and ED provider's notes Notable results as noted in HPI   Assessment and Plan: * Acute respiratory failure with hypoxia [facility]) Patient presented with shortness of breath requiring O2 at 5 L for O2 sat 70s to 80s, now down to 2 L History of intubation February 2023 for acute respiratory failure of multifactorial etiology Current etiology probably related to CAP/acute bronchitis/COPD exacerbation in view of cough, with possibility of CHF given elevated BNP Elevated creatinine precludes CTA chest to evaluate for PE but given atypical pneumonia on chest x-ray, low suspicion for PE  supplemental O2 to keep sats over 92% Treat CAP and COPD as outlined below Will get echo to evaluate LVEF  CAP (community acquired pneumonia) Acute bronchitis/COPD exacerbation Chest x-ray showing possible atypical infection.  COVID and flu negative as well as  respiratory viral panel Follow procalcitonin Will continue Rocephin and azithromycin Antitussives, flutter valve, incentive spirometer DuoNebs every 6 and as needed Will hold off on systemic steroids for now due to possibility of atypical pneumonia   Acute renal failure superimposed on stage 4 chronic kidney disease (HCC) Creatinine 4.92 up from baseline of 3 Uncertain etiology, but suspecting prerenal  Will give gentle hydration and monitor Can consider nephrology consult if worsening  Chronic systolic CHF (congestive heart failure) (HCC) BNP slightly elevated to 274.  Chest x-ray not consistent with fluid overload EF 40 to 45% in February 2023. Continue carvedilol Will get echocardiogram Daily weights with intake and output monitor    Cardiac defibrillator in situ No acute issues suspected  Anemia of chronic kidney failure, stage 4 (severe) (HCC) Hemoglobin 11.9, improved from usual baseline  HTN (hypertension) Blood pressure controlled.  Continue carvedilol  Insulin dependent type 2 diabetes mellitus, controlled (HCC) Blood sugar with fair control Continue basal insulin with sliding scale coverage    DVT prophylaxis: Lovenox  Consults: none  Advance Care Planning:   Code Status: Prior   Family Communication: none  Disposition Plan: Back to previous home environment  Severity of Illness: The appropriate patient status for this patient is INPATIENT. Inpatient status is judged to be reasonable and necessary in order to provide the required intensity of service to ensure the patient's safety. The patient's presenting symptoms, physical exam findings, and initial radiographic and laboratory data in the context of their chronic comorbidities is felt to place them at high risk for further clinical deterioration. Furthermore, it is not anticipated that the patient will be medically stable for discharge from the hospital within 2 midnights of admission.   * I certify that  at the point of admission it is my clinical judgment that the patient will require inpatient hospital care spanning beyond 2 midnights from the point of admission due to high intensity of service, high risk for further deterioration and high frequency of surveillance required.*  Author: Athena Masse, MD 04/16/2022 2:01 AM  For on call review [email or web address].

## 2022-04-16 NOTE — Hospital Course (Signed)
Taken from H&P.  Phillip Reyes is a 81 y.o. male with medical history significant for DM, HTN, CKD 4 with chronic anemia, COPD, HTN, chronic systolic heart failure who was brought by EMS for evaluation of shortness of breath.  Patient was sent from [facility] where he has been for the last 2 days and the call out to EMS was initially for abnormal labs.  On arrival of EMS they found his O2 sat to be in the 70s to 80s and was placed on O2 at 5 L with improvement to 93%. Patient reports he started developing shortness of breath about a week prior and it has progressively worsened and spite of being compliant with his medication.  He has a congested cough but no fevers or chills.  He denies chest pain or palpitations.  Denies lower extremity pain. Patient received DuoNebs and Solu-Medrol and route  ED course and data review: Afebrile with BP 133/51 and pulse 62 with O2 sat 97% on 5 L.  Respirations 22.   Labs: VBG unremarkable.  troponin 9 and BNP 274.  WBC normal at 7100 and COVID and flu negative.  Hemoglobin 11.9, better than baseline of 9.4,Creatinine 4.92 up from baseline of 3 EKG, personally viewed and interpreted: Showing sinus at 63 with LBBB and no acute ischemic ST-T wave changes. Chest x-ray showing ill-defined opacities in the right midlung and both lower lung zones, atelectasis or possible atypical infection Patient was able to be weaned from 5 L O2 to 2 L with treatment in the ED which included Rocephin and azithromycin for possible atypical infection.

## 2022-04-16 NOTE — Assessment & Plan Note (Signed)
Creatinine 4.92 up from baseline of 3 Uncertain etiology, but suspecting prerenal Will give gentle hydration and monitor Can consider nephrology consult if worsening

## 2022-04-16 NOTE — Progress Notes (Signed)
No charge progress note.  Phillip Reyes is a 81 y.o. male with medical history significant for DM, HTN, CKD 4 with chronic anemia, COPD, HTN, chronic systolic heart failure who was brought by EMS for evaluation of shortness of breath.  Patient was sent from [facility] where he has been for the last 2 days and the call out to EMS was initially for abnormal labs.  On arrival of EMS they found his O2 sat to be in the 70s to 80s and was placed on O2 at 5 L with improvement to 93%. Patient reports he started developing shortness of breath about a week prior and it has progressively worsened and spite of being compliant with his medication.  He has a congested cough but no fevers or chills.  He denies chest pain or palpitations.  Denies lower extremity pain. Patient received DuoNebs and Solu-Medrol and route  ED course and data review: Afebrile with BP 133/51 and pulse 62 with O2 sat 97% on 5 L.  Respirations 22.   Labs: VBG unremarkable.  troponin 9 and BNP 274.  WBC normal at 7100 and COVID and flu negative.  Hemoglobin 11.9, better than baseline of 9.4,Creatinine 4.92 up from baseline of 3 EKG, personally viewed and interpreted: Showing sinus at 63 with LBBB and no acute ischemic ST-T wave changes. Chest x-ray showing ill-defined opacities in the right midlung and both lower lung zones, atelectasis or possible atypical infection Patient was able to be weaned from 5 L O2 to 2 L with treatment in the ED which included Rocephin and azithromycin for possible atypical infection.    Patient continued to have some cough and shortness of breath when seen today.  Able to be weaned to 2 L of oxygen. Exam pertinent for bilateral scattered rhonchi.  No lower extremity edema. Procalcitonin elevated at 2.03.  CBG elevated,-increasing the dose of Semglee to twice daily. Added 4 units with meal along with SSI Nephrology was consulted for worsening renal function.  Will continue with ceftriaxone and Zithromax for  pneumonia. Holding home antihypertensives today due to borderline blood pressure.

## 2022-04-16 NOTE — Assessment & Plan Note (Addendum)
BNP slightly elevated to 274.  Chest x-ray not consistent with fluid overload EF 40 to 45% in February 2023. Continue carvedilol Will get echocardiogram Daily weights with intake and output monitor

## 2022-04-16 NOTE — ED Notes (Signed)
Report given to Crystal RN 

## 2022-04-16 NOTE — ED Notes (Signed)
Pt on the toilet- assisted back onto bed by this tech. Pt uses walker. Pt placed back on card monitor. No other needs voiced at this time.

## 2022-04-16 NOTE — ED Provider Notes (Signed)
Nurse called to advise me that the patient's blood pressure is 95/41.  We will hold the Coreg.  He has gotten antibiotics already we will give him a 250 cc bolus since he has some renal failure and a history of CHF as well.  Will have the nurse notified the hospitalist.  Will have to keep a close eye on this gentleman.   Nena Polio, MD 04/16/22 [phone]

## 2022-04-16 NOTE — Assessment & Plan Note (Signed)
No acute issues suspected 

## 2022-04-16 NOTE — Assessment & Plan Note (Signed)
Acute bronchitis/COPD exacerbation Chest x-ray showing possible atypical infection.  COVID and flu negative as well as respiratory viral panel Follow procalcitonin Will continue Rocephin and azithromycin Antitussives, flutter valve, incentive spirometer DuoNebs every 6 and as needed Will hold off on systemic steroids for now due to possibility of atypical pneumonia

## 2022-04-16 NOTE — Assessment & Plan Note (Signed)
Blood pressure controlled.  Continue carvedilol

## 2022-04-16 NOTE — Hospital Course (Signed)
Taken from H&P.  Phillip Reyes is a 81 y.o. male with medical history significant for DM, HTN, CKD 4 with chronic anemia, COPD, HTN, chronic systolic heart failure who was brought by EMS for evaluation of shortness of breath.  Patient was sent from [facility] where he has been for the last 2 days and the call out to EMS was initially for abnormal labs.  On arrival of EMS they found his O2 sat to be in the 70s to 80s and was placed on O2 at 5 L with improvement to 93%. Patient reports he started developing shortness of breath about a week prior and it has progressively worsened and spite of being compliant with his medication.  He has a congested cough but no fevers or chills.   ED course and data review: Afebrile with BP 133/51 and pulse 62 with O2 sat 97% on 5 L.  Respirations 22.   Labs: VBG unremarkable.  troponin 9 and BNP 274.  WBC normal at 7100 and COVID and flu negative.  Hemoglobin 11.9, better than baseline of 9.4,Creatinine 4.92 up from baseline of 3 EKG, personally viewed and interpreted: Showing sinus at 63 with LBBB and no acute ischemic ST-T wave changes. Chest x-ray showing ill-defined opacities in the right midlung and both lower lung zones, atelectasis or possible atypical infection Patient was able to be weaned from 5 L O2 to 2 L with treatment in the ED which included Rocephin and azithromycin for possible atypical infection.  12/10: Patient with softer blood pressure requiring a small bolus.  Home antihypertensives were held.  Little worsening of leukocytosis at 11.4, hemoglobin 10.7.  BUN 88 with creatinine 4.83, none anion gap metabolic acidosis most likely secondary to renal disease with bicarb of 18.  Blood glucose elevated at 363.  Increasing the dose of Semglee to twice daily.  Procalcitonin elevated at 2.03, blood cultures pending.  BNP at 274. Nephrology was consulted.

## 2022-04-16 NOTE — Progress Notes (Signed)
Anticoagulation monitoring(Lovenox):  81 yo  male ordered Lovenox 30 mg Q24h    Filed Weights   04/15/22 2259  Weight: 71.2 kg (157 lb)   BMI 23.9   Lab Results  Component Value Date   CREATININE 4.92 (H) 04/15/2022   CREATININE 3.00 (H) 11/01/2021   CREATININE 3.37 (H) 10/31/2021   Estimated Creatinine Clearance: 11.4 mL/min (A) (by C-G formula based on SCr of 4.92 mg/dL (H)). Hemoglobin & Hematocrit     Component Value Date/Time   HGB 11.9 (L) 04/15/2022 2316   HGB 10.9 (L) 02/17/2013 0417   HCT 37.7 (L) 04/15/2022 2316   HCT 30.9 (L) 02/17/2013 0417     Per Protocol for Patient with estCrcl < 15 ml/min and BMI < 40, will transition to heparin 5000 units SQ Q8H.

## 2022-04-17 ENCOUNTER — Inpatient Hospital Stay
Admit: 2022-04-17 | Discharge: 2022-04-17 | Disposition: A | Discharge status: Home / Self Care | Payer: No Typology Code available for payment source | Attending: Internal Medicine | Admitting: Internal Medicine

## 2022-04-17 ENCOUNTER — Inpatient Hospital Stay: Payer: No Typology Code available for payment source

## 2022-04-17 LAB — ECHOCARDIOGRAM COMPLETE
AR max vel: 1.19 cm2
AV Area VTI: 1.32 cm2
AV Area mean vel: 1.22 cm2
AV Mean grad: 8.3 mmHg
AV Peak grad: 13.4 mmHg
Ao pk vel: 1.83 m/s
Area-P 1/2: 4.04 cm2
Height: 68 in
S' Lateral: 3.1 cm
Weight: 2512 oz

## 2022-04-17 LAB — CBG MONITORING, ED
Glucose-Capillary: 171 mg/dL — ABNORMAL HIGH (ref 70–99)
Glucose-Capillary: 122 mg/dL — ABNORMAL HIGH (ref 70–99)
Glucose-Capillary: 101 mg/dL — ABNORMAL HIGH (ref 70–99)
Glucose-Capillary: 115 mg/dL — ABNORMAL HIGH (ref 70–99)

## 2022-04-17 LAB — MAGNESIUM: Magnesium: 1.9 mg/dL (ref 1.7–2.4)

## 2022-04-17 LAB — HEMOGLOBIN A1C
Hgb A1c MFr Bld: 6 % — ABNORMAL HIGH (ref 4.8–5.6)
Mean Plasma Glucose: 126 mg/dL

## 2022-04-17 MED ORDER — INSULIN GLARGINE-YFGN 100 UNIT/ML ~~LOC~~ SOLN
10.0000 [IU] | Freq: Two times a day (BID) | SUBCUTANEOUS | Status: DC
  Administered 2022-04-17: 10 [IU] via SUBCUTANEOUS
  Filled 2022-04-17 (×2): qty 0.1

## 2022-04-17 MED ORDER — SODIUM CHLORIDE 0.9 % IV SOLN: INTRAVENOUS | Status: DC

## 2022-04-17 NOTE — TOC Initial Note (Signed)
Transition of Care [facility]) - Initial/Assessment Note    Patient Details  Name: Phillip Reyes MRN: [id number] Date of Birth: Jun 16, 1940  Transition of Care [facility]) CM/SW Contact:    Shelbie Hutching, RN Phone Number: 04/17/2022, 1:31 PM  Clinical Narrative:                 Patient admitted to the hospital with acute respiratory failure.  Patient came in from [facility] where he and his wife are just staying for respite care until their home has been treated for mold.  Patient has only been at [facility] for a day or so, his home owners insurance is paying for the respite.  If patient were to need SNF at discharge insurance authorization either through his [facility] or through the [place] would need to be obtained.  Patient walks with a walker currently not requiring any oxygen.   TOC will cont to follow, plan will be for patient to go back to [facility] for respite, his home owners will pay for respite for up to 6 weeks.   Expected Discharge Plan: Skilled Nursing Facility Barriers to Discharge: Continued Medical Work up   Patient Goals and CMS Choice   CMS Medicare.gov Compare Post Acute Care list provided to:: Patient Choice offered to / list presented to : Patient  Expected Discharge Plan and Services Expected Discharge Plan: [place]   Discharge Planning Services: CM Consult   Living arrangements for the past 2 months: Single Family Home                                      Prior Living Arrangements/Services Living arrangements for the past 2 months: Single Family Home Lives with:: Spouse Patient language and need for interpreter reviewed:: Yes Do you feel safe going back to the place where you live?: Yes      Need for Family Participation in Patient Care: Yes (Comment) Care giver support system in place?: Yes (comment) Current home services: DME (walkers, canes, crutches) Criminal Activity/Legal Involvement Pertinent to Current  Situation/Hospitalization: No - Comment as needed  Activities of Daily Living      Permission Sought/Granted Permission sought to share information with : Case Manager, Customer service manager, Family Supports    Share Information with NAME: Phillip Reyes  Permission granted to share info w AGENCY: [place] granted to share info w Relationship: wife  Permission granted to share info w Contact Information: [phone]  Emotional Assessment       Orientation: : Oriented to Self, Oriented to Place, Oriented to  Time, Oriented to Situation Alcohol / Substance Use: Not Applicable Psych Involvement: No (comment)  Admission diagnosis:  Acute respiratory failure with hypoxia ([place]) [J96.01] Patient Active Problem List   Diagnosis Date Noted   Acute respiratory failure with hypoxia ([place]) 04/16/2022   Anemia of chronic kidney failure, stage 4 (severe) ([place]) 04/16/2022   Prostate cancer ([place]) 10/28/2021   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/28/2021   Paroxysmal supraventricular tachycardia 10/28/2021   Frequent falls 10/27/2021   Unable to care for self 10/27/2021   History of fracture of right hip 09/2021 10/27/2021   Fever 09/20/2021   Acute on chronic anemia 09/18/2021   Delirium 09/17/2021   Dysphagia 09/16/2021   Other fracture of right femur, initial encounter for closed fracture ([place]) 46/65/9935   Chronic systolic CHF (congestive heart failure) ([place])  09/14/2021   CKD (chronic kidney disease) stage 4, GFR 15-29 ml/min (HCC) 09/14/2021   Hyperkalemia 09/14/2021   Enterococcus UTI 06/27/2021   Aspiration pneumonia ([place]) 06/27/2021   AKI (acute kidney injury) ([place]) 06/27/2021   Acute renal failure superimposed on stage 4 chronic kidney disease ([place])    Malnutrition of moderate degree 06/24/2021   Monocytosis    Acute respiratory failure ([place]) 06/22/2021   COPD with acute exacerbation ([place]) 04/16/2020   CAP (community acquired pneumonia) 01/27/2019    Repeated falls 10/17/2018   Altered mental status 03/09/2016   Acute encephalopathy 06/11/2015   Insulin dependent type 2 diabetes mellitus, controlled ([place]) 06/11/2015   HTN (hypertension) 06/11/2015   COPD (chronic obstructive pulmonary disease) ([place]) 06/11/2015   H/O: stroke 06/11/2015   PCP:  [place]:   [facility] [place], [place] [address] AT [address] [facility] [place] [address] Phone: [phone] Fax: [place], [place] [address] Phone: [phone] Fax: [phone]  [id number]: [phone]: [phone]    Social Determinants of Health (SDOH) Interventions    Readmission Risk Interventions    04/17/2022    1:21 PM  Readmission Risk Prevention Plan  Medication Review (RN Care Manager) Complete  PCP or Specialist appointment within 3-5 days of discharge Complete  SW Recovery Care/Counseling Consult Complete  Palliative Care Screening [place]

## 2022-04-17 NOTE — Progress Notes (Signed)
*  PRELIMINARY RESULTS* Echocardiogram 2D Echocardiogram has been performed.  Sherrie Sport 04/17/2022, 8:20 AM

## 2022-04-17 NOTE — Discharge Instructions (Signed)

## 2022-04-17 NOTE — Progress Notes (Signed)
PROGRESS NOTE    Vikash Nest  FFM:[id number]  DOB: 1940-07-28  DOA: 04/15/2022 PCP: [facility] [place] [facility]:   Hospital course:  81 year old man with DM2, HTN, CKD 4, COPD and HFrEF was admitted yesterday with likely CAP and or decompensated heart failure.  Patient was started on azithromycin and ceftriaxone.   Subjective:  Patient states he feels much better, is almost back to baseline.  Denies chest pain   Objective: Vitals:   04/17/22 1014 04/17/22 1200 04/17/22 1400 04/17/22 1426  BP:  (!) 121/53 (!) 118/53   Pulse:  (!) 59 60   Resp:  15 19   Temp: 98 F (36.7 C)   98 F (36.7 C)  TempSrc: Oral   Oral  SpO2:  100% 97%   Weight:      Height:        Intake/Output Summary (Last 24 hours) at 04/17/2022 1658 Last data filed at 04/17/2022 0146 Gross per 24 hour  Intake --  Output 600 ml  Net -600 ml   Filed Weights   04/15/22 2259  Weight: 71.2 kg     Exam:  General: Patient sitting up in bed with mild nonlabored tachypnea and slight flaring of nostrils in NAD Eyes: sclera anicteric, conjuctiva mild injection bilaterally CVS: S1-S2, regular  Respiratory:  decreased air entry bilaterally secondary to decreased inspiratory effort, rales at bases  GI: NABS, soft, NT  LE: Warm and well-perfused Neuro: A/O x 3,  grossly nonfocal.  Psych: patient is logical and coherent, judgement and insight appear normal, mood and affect appropriate to situation.  Data Reviewed:  Basic Metabolic Panel: Recent Labs  Lab 04/15/22 2316 04/16/22 0237  NA 142 139  K 4.8 4.8  CL 110 110  CO2 20* 18*  GLUCOSE 160* 363*  BUN 86* 88*  CREATININE 4.92* 4.83*  CALCIUM 8.7* 8.0*    CBC: Recent Labs  Lab 04/15/22 2316 04/16/22 0237  WBC 7.1 11.4*  NEUTROABS 5.3  --   HGB 11.9* 10.7*  HCT 37.7* 33.5*  MCV 95.9 95.4  PLT 241 184     Scheduled Meds:  aspirin EC  81 mg Oral Daily   atorvastatin  80 mg Oral QHS   carvedilol  6.25 mg  Oral BID WC   citalopram  20 mg Oral Daily   heparin injection (subcutaneous)  5,000 Units Subcutaneous Q8H   insulin aspart  0-15 Units Subcutaneous TID WC   insulin aspart  0-5 Units Subcutaneous QHS   insulin aspart  4 Units Subcutaneous TID WC   insulin glargine-yfgn  7 Units Subcutaneous BID   ipratropium-albuterol  3 mL Nebulization Q6H   tamsulosin  0.8 mg Oral QHS   Continuous Infusions:  sodium chloride     azithromycin Stopped (04/17/22 0145)   cefTRIAXone (ROCEPHIN)  IV Stopped (04/17/22 0136)     Assessment & Plan:   Acute hypoxic respiratory failure secondary to CAP and/or COPD exacerbation Patient is improved with ceftriaxone and azithromycin as well as inhaled bronchodilators Patient has not been started on steroids  NSVT Patient had two 20 beat runs of NSVT, was asymptomatic Potassium is within normal limits Check a magnesium level and replete aggressively as warranted Patient has ICD in place  Echocardiogram was done with normal EF and no SWMF  DM 2 Increase glargine from 70 units up to 10 units of  CRF Modest increase in creatinine, improved with hydration and treatment of infection Renal consult had been ordered on admission  Can consider nephrology consult if warranted  HTN Well-controlled on carvedilol    Studies: ECHOCARDIOGRAM COMPLETE  Result Date: 04/17/2022    ECHOCARDIOGRAM REPORT   Patient Name:   Phillip Reyes Date of Exam: 04/17/2022 Medical Rec #:  [id number]     Height:       68.0 in Accession #:    [id number]    Weight:       157.0 lb Date of Birth:  1940-11-22    BSA:          1.844 m Patient Age:    35 years      BP:           107/64 mmHg Patient Gender: M             HR:           63 bpm. Exam Location:  [facility] Procedure: 2D Echo, Cardiac Doppler and Color Doppler Indications:     CHF ---acute systolic G81.85  History:         Patient has prior history of Echocardiogram examinations, most                  recent 06/26/2021.  Sonographer:      Sherrie Sport Referring Phys:  [id number] Athena Masse Diagnosing Phys: Yolonda Kida MD  Sonographer Comments: Suboptimal parasternal window and Technically challenging study due to limited acoustic windows. IMPRESSIONS  1. Borderline apical hypo. Normal Overall LVF.  2. Left ventricular ejection fraction, by estimation, is 55 to 60%. The left ventricle has normal function. The left ventricle has no regional wall motion abnormalities. Left ventricular diastolic parameters are consistent with Grade I diastolic dysfunction (impaired relaxation).  3. Right ventricular systolic function is normal. The right ventricular size is normal.  4. The mitral valve is normal in structure. Trivial mitral valve regurgitation.  5. The aortic valve is normal in structure. Aortic valve regurgitation is not visualized. FINDINGS  Left Ventricle: Left ventricular ejection fraction, by estimation, is 55 to 60%. The left ventricle has normal function. The left ventricle has no regional wall motion abnormalities. The left ventricular internal cavity size was normal in size. There is  borderline left ventricular hypertrophy. Left ventricular diastolic parameters are consistent with Grade I diastolic dysfunction (impaired relaxation). Right Ventricle: The right ventricular size is normal. No increase in right ventricular wall thickness. Right ventricular systolic function is normal. Left Atrium: Left atrial size was normal in size. Right Atrium: Right atrial size was normal in size. Pericardium: There is no evidence of pericardial effusion. Mitral Valve: The mitral valve is normal in structure. Trivial mitral valve regurgitation. Tricuspid Valve: The tricuspid valve is normal in structure. Tricuspid valve regurgitation is trivial. Aortic Valve: The aortic valve is normal in structure. Aortic valve regurgitation is not visualized. Aortic valve mean gradient measures 8.3 mmHg. Aortic valve peak gradient measures 13.4 mmHg. Aortic valve  area, by VTI measures 1.32 cm. Pulmonic Valve: The pulmonic valve was normal in structure. Pulmonic valve regurgitation is not visualized. Aorta: The ascending aorta was not well visualized. IAS/Shunts: No atrial level shunt detected by color flow Doppler. Additional Comments: Borderline apical hypo. Normal Overall LVF.  LEFT VENTRICLE PLAX 2D LVIDd:         4.40 cm   Diastology LVIDs:         3.10 cm   LV e' medial:    6.53 cm/s LV PW:         1.20 cm   LV  E/e' medial:  12.1 LV IVS:        1.00 cm   LV e' lateral:   6.64 cm/s LVOT diam:     2.00 cm   LV E/e' lateral: 11.9 LV SV:         57 LV SV Index:   31 LVOT Area:     3.14 cm  RIGHT VENTRICLE RV Basal diam:  3.10 cm RV Mid diam:    2.90 cm RV S prime:     15.60 cm/s TAPSE (M-mode): 2.2 cm LEFT ATRIUM             Index        RIGHT ATRIUM           Index LA diam:        3.10 cm 1.68 cm/m   RA Area:     11.10 cm LA Vol (A2C):   23.3 ml 12.64 ml/m  RA Volume:   28.10 ml  15.24 ml/m LA Vol (A4C):   19.9 ml 10.79 ml/m LA Biplane Vol: 21.9 ml 11.88 ml/m  AORTIC VALVE AV Area (Vmax):    1.19 cm AV Area (Vmean):   1.22 cm AV Area (VTI):     1.32 cm AV Vmax:           183.00 cm/s AV Vmean:          134.000 cm/s AV VTI:            0.432 m AV Peak Grad:      13.4 mmHg AV Mean Grad:      8.3 mmHg LVOT Vmax:         69.50 cm/s LVOT Vmean:        52.000 cm/s LVOT VTI:          0.182 m LVOT/AV VTI ratio: 0.42  AORTA Ao Root diam: 3.17 cm MITRAL VALVE                TRICUSPID VALVE MV Area (PHT): 4.04 cm     TR Peak grad:   23.8 mmHg MV Decel Time: 188 msec     TR Vmax:        244.00 cm/s MV E velocity: 79.10 cm/s MV A velocity: 111.00 cm/s  SHUNTS MV E/A ratio:  0.71         Systemic VTI:  0.18 m                             Systemic Diam: 2.00 cm Yolonda Kida MD Electronically signed by Yolonda Kida MD Signature Date/Time: 04/17/2022/4:24:26 PM    Final    DG Chest Portable 1 View  Result Date: 04/15/2022 CLINICAL DATA:  Acute hypoxia. Dyspnea. Cough  for 1 week. 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 Stable heart size and mediastinal contours. Mild diffuse interstitial coarsening with slight ill-defined opacities in the right mid lung and both lower lung zones. No pleural effusion or pneumothorax. No acute osseous findings. IMPRESSION: Ill-defined opacities in the right mid lung and both lower lung zones, atelectasis or possible atypical infection. Electronically Signed   By: Keith Rake M.D.   On: 04/15/2022 23:45    Principal Problem:   Acute respiratory failure with hypoxia (HCC) Active Problems:   CAP (community acquired pneumonia)   COPD with acute exacerbation ([place])   Acute renal failure superimposed on stage 4 chronic kidney  disease ([place])   Chronic systolic CHF (congestive heart failure) (HCC)   Cardiac defibrillator in situ   Insulin dependent type 2 diabetes mellitus, controlled ([place])   HTN (hypertension)   Anemia of chronic kidney failure, stage 4 (severe) ([place])     Geovanna Simko Tublu [place] Wiseman, [facility]  If 7PM-7AM, please contact night-coverage [email or web address]   LOS: 1 day

## 2022-04-17 NOTE — Consult Note (Signed)
[facility]  CONSULT NOTE    Date: 04/17/2022                  Patient Name:  Phillip Reyes  MRN: [id number]  DOB: [birth year]  Age / Sex: 81 y.o., male         PCP: [facility] [place]                 Service Requesting Consult: [place]                 Reason for Consult: Acute kidney injury            History of Present Illness: Mr. Phillip Reyes is a 81 y.o.  male with past medical history of diabetes, hypertension, anemia, COPD, and chronic systolic heart failure, who was admitted to [facility] on 04/15/2022 for Acute respiratory failure with hypoxia [facility]) [J96.01]  Patient presents to the emergency department with abnormal labs and shortness of breath.  Patient is seen resting on stretcher.  No family at bedside.  Patient states that him and his wife recently moved into a nursing facility.  Patient states he has been feeling unwell for the past week.  Has complained of nausea and poor oral intake.  States shortness of breath began yesterday.  Denies known fever or chills.  Productive cough present.  Denies known sick contacts.  Labs on ED arrival significant for serum bicarb 20, glucose 160, BUN 86, creatinine 4.92 with GFR 11, calcium 8.7, BNP  274, and hemoglobin 11.9.  Respiratory panel negative for influenza, COVID-19, and RSV.  Blood cultures pending.  Chest x-ray suggestive for right mid and bilateral lower lung opacities.   Medications: Outpatient medications: (Not in a hospital admission)   Current medications: Current Facility-Administered Medications  Medication Dose Route Frequency Provider Last Rate Last Admin   acetaminophen (TYLENOL) tablet 650 mg  650 mg Oral Q6H PRN Athena Masse, MD       Or   acetaminophen (TYLENOL) suppository 650 mg  650 mg Rectal Q6H PRN Athena Masse, MD       albuterol (PROVENTIL) (2.5 MG/3ML) 0.083% nebulizer solution 2.5 mg  2.5 mg Nebulization Q2H PRN Athena Masse, MD       aspirin EC tablet 81 mg  81 mg  Oral Daily Judd Gaudier V, MD   81 mg at 04/17/22 1012   atorvastatin (LIPITOR) tablet 80 mg  80 mg Oral QHS Judd Gaudier V, MD   80 mg at 04/16/22 2121   azithromycin (ZITHROMAX) 500 mg in sodium chloride 0.9 % 250 mL IVPB  500 mg Intravenous Q24H Athena Masse, MD   Stopped at 04/17/22 0145   carvedilol (COREG) tablet 6.25 mg  6.25 mg Oral BID WC Judd Gaudier V, MD   6.25 mg at 04/17/22 0830   cefTRIAXone (ROCEPHIN) 2 g in sodium chloride 0.9 % 100 mL IVPB  2 g Intravenous Q24H Athena Masse, MD   Stopped at 04/17/22 0136   citalopram (CELEXA) tablet 20 mg  20 mg Oral Daily Judd Gaudier V, MD   20 mg at 04/17/22 1010   heparin injection 5,000 Units  5,000 Units Subcutaneous Q8H Judd Gaudier V, MD   5,000 Units at 04/17/22 0534   insulin aspart (novoLOG) injection 0-15 Units  0-15 Units Subcutaneous TID WC Athena Masse, MD   3 Units at 04/17/22 0830   insulin aspart (novoLOG) injection 0-5 Units  0-5 Units Subcutaneous QHS Damita Dunnings,  Waldemar Dickens, MD   5 Units at 04/16/22 0407   insulin aspart (novoLOG) injection 4 Units  4 Units Subcutaneous TID WC Lorella Nimrod, MD   4 Units at 04/17/22 0831   insulin glargine-yfgn (SEMGLEE) injection 7 Units  7 Units Subcutaneous BID Lorella Nimrod, MD   7 Units at 04/17/22 1012   ipratropium-albuterol (DUONEB) 0.5-2.5 (3) MG/3ML nebulizer solution 3 mL  3 mL Nebulization Q6H Judd Gaudier V, MD   3 mL at 04/17/22 0832   ondansetron (ZOFRAN) tablet 4 mg  4 mg Oral Q6H PRN Athena Masse, MD       Or   ondansetron [facility]) injection 4 mg  4 mg Intravenous Q6H PRN Athena Masse, MD       tamsulosin [facility]) capsule 0.8 mg  0.8 mg Oral QHS Athena Masse, MD   0.8 mg at 04/16/22 2122   Current Outpatient Medications  Medication Sig Dispense Refill   albuterol (VENTOLIN HFA) 108 (90 Base) MCG/ACT inhaler Inhale 2 puffs into the lungs every 6 (six) hours as needed for wheezing or shortness of breath.     amLODipine (NORVASC) 10 MG tablet Take 10 mg by mouth  daily.     aspirin EC 81 MG tablet Take 81 mg by mouth daily.     carvedilol (COREG) 6.25 MG tablet Take 1 tablet (6.25 mg total) by mouth 2 (two) times daily with a meal.     citalopram (CELEXA) 20 MG tablet Take 20 mg by mouth daily.     cyanocobalamin (VITAMIN B12) 1000 MCG tablet Take 1,000 mcg by mouth daily.     diclofenac Sodium (VOLTAREN) 1 % GEL Apply 4 g topically 4 (four) times daily.     furosemide (LASIX) 40 MG tablet Take 40 mg by mouth daily.     insulin regular (NOVOLIN R) 100 units/mL injection Inject into the skin 3 (three) times daily before meals.     lisinopril (ZESTRIL) 20 MG tablet Take 20 mg by mouth daily.     tamsulosin (FLOMAX) 0.4 MG CAPS capsule Take 0.8 mg by mouth at bedtime.     acetaminophen (TYLENOL) 500 MG tablet Take 2 tablets (1,000 mg total) by mouth 3 (three) times daily as needed for mild pain, moderate pain or headache. 30 tablet 0   atorvastatin (LIPITOR) 80 MG tablet Take 80 mg by mouth at bedtime.     azelastine (ASTELIN) 0.1 % nasal spray Place 1 spray into both nostrils 2 (two) times daily as needed for rhinitis.     budesonide-formoterol (SYMBICORT) 160-4.5 MCG/ACT inhaler Inhale 2 puffs into the lungs 2 (two) times daily. (Patient not taking: Reported on 04/16/2022)     docusate sodium (COLACE) 100 MG capsule Take 1 capsule (100 mg total) by mouth 2 (two) times daily. 10 capsule 0   ferrous sulfate 325 (65 FE) MG tablet Take 1 tablet (325 mg total) by mouth every other day. (Patient not taking: Reported on 04/16/2022)  3   gabapentin (NEURONTIN) 300 MG capsule Take 300 mg by mouth at bedtime.     insulin glargine-yfgn (SEMGLEE) 100 UNIT/ML injection Inject 0.07 mLs (7 Units total) into the skin at bedtime. (Patient taking differently: Inject 8 Units into the skin at bedtime.) 10 mL 11   ipratropium-albuterol (DUONEB) 0.5-2.5 (3) MG/3ML SOLN Take 3 mLs by nebulization every 6 (six) hours as needed.     traZODone (DESYREL) 50 MG tablet Take 50 mg by  mouth at bedtime.  Allergies: Allergies  Allergen Reactions   Norco [Hydrocodone-Acetaminophen] Shortness Of Breath   Hydrochlorothiazide Other (See Comments)   Metformin Other (See Comments)   Terazosin Other (See Comments)      Past Medical History: Past Medical History:  Diagnosis Date   Arthritis    Cancer ([place])    COPD (chronic obstructive pulmonary disease) ([place])    Diabetes mellitus without complication ([place])    Hypertension    Renal disorder    Stroke [facility])      Past Surgical History: Past Surgical History:  Procedure Laterality Date   APPENDECTOMY     INTRAMEDULLARY (IM) NAIL INTERTROCHANTERIC Right 09/15/2021   Procedure: INTRAMEDULLARY (IM) NAIL INTERTROCHANTRIC;  Surgeon: Hessie Knows, MD;  Location: [facility] ORS;  Service: Orthopedics;  Laterality: Right;   THROAT SURGERY       Family History: Family History  Problem Relation Age of Onset   Congestive Heart Failure Father    Other Mother        unknown medical history     Social History: Social History   Socioeconomic History   Marital status: Married    Spouse name: Not on file   Number of children: Not on file   Years of education: Not on file   Highest education level: Not on file  Occupational History   Not on file  Tobacco Use   Smoking status: Every Day    Packs/day: 0.50    Years: 61.00    Total pack years: 30.50    Types: Cigarettes   Smokeless tobacco: Never  Vaping Use   Vaping Use: Never used  Substance and Sexual Activity   Alcohol use: No   Drug use: No   Sexual activity: Not on file  Other Topics Concern   Not on file  Social History Narrative   Not on file   Social Determinants of Health   Financial Resource Strain: Not on file  Food Insecurity: Not on file  Transportation Needs: Not on file  Physical Activity: Not on file  Stress: Not on file  Social Connections: Not on file  Intimate Partner Violence: Not on file     Review of Systems: Review of  Systems  Constitutional:  Negative for chills, fever and malaise/fatigue.  HENT:  Negative for congestion, sore throat and tinnitus.   Eyes:  Negative for blurred vision and redness.  Respiratory:  Positive for shortness of breath. Negative for cough and wheezing.   Cardiovascular:  Negative for chest pain, palpitations, claudication and leg swelling.  Gastrointestinal:  Negative for abdominal pain, blood in stool, diarrhea, nausea and vomiting.  Genitourinary:  Negative for flank pain, frequency and hematuria.  Musculoskeletal:  Negative for back pain, falls and myalgias.  Skin:  Negative for rash.  Neurological:  Negative for dizziness, weakness and headaches.  Endo/Heme/Allergies:  Does not bruise/bleed easily.  Psychiatric/Behavioral:  Negative for depression. The patient is not nervous/anxious and does not have insomnia.     Vital Signs: Blood pressure 119/70, pulse 68, temperature 98 F (36.7 C), temperature source Oral, resp. rate 18, height '5\' 8"'$  (1.727 m), weight 71.2 kg, SpO2 93 %.  Weight trends: Filed Weights   04/15/22 2259  Weight: 71.2 kg    Physical Exam: General: NAD  Head: Normocephalic, atraumatic. Dry oral mucosal membranes  Eyes: Anicteric  Lungs:  Basilar rhonchi, normal effort  Heart: Regular rate and rhythm  Abdomen:  Soft, nontender, nondistended  Extremities:  No peripheral edema.  Neurologic: Nonfocal, moving all four extremities  Skin: No lesions  Access: None     Lab results: Basic Metabolic Panel: Recent Labs  Lab 04/15/22 2316 04/16/22 0237  NA 142 139  K 4.8 4.8  CL 110 110  CO2 20* 18*  GLUCOSE 160* 363*  BUN 86* 88*  CREATININE 4.92* 4.83*  CALCIUM 8.7* 8.0*    Liver Function Tests: Recent Labs  Lab 04/15/22 2316  AST 13*  ALT 11  ALKPHOS 60  BILITOT 0.6  PROT 7.1  ALBUMIN 4.0   Recent Labs  Lab 04/15/22 2316  LIPASE 39   No results for input(s): "AMMONIA" in the last 168 hours.  CBC: Recent Labs  Lab  04/15/22 2316 04/16/22 0237  WBC 7.1 11.4*  NEUTROABS 5.3  --   HGB 11.9* 10.7*  HCT 37.7* 33.5*  MCV 95.9 95.4  PLT 241 184    Cardiac Enzymes: No results for input(s): "CKTOTAL", "CKMB", "CKMBINDEX", "TROPONINI" in the last 168 hours.  BNP: Invalid input(s): "POCBNP"  CBG: Recent Labs  Lab 04/16/22 0750 04/16/22 1144 04/16/22 1704 04/16/22 2057 04/17/22 0744  GLUCAP 322* 358* 252* 124* 171*    Microbiology: Results for orders placed or performed during the hospital encounter of 04/15/22  Resp panel by RT-PCR (RSV, Flu A&B, Covid) Anterior Nasal Swab     Status: None   Collection Time: 04/15/22 11:29 PM   Specimen: Anterior Nasal Swab  Result Value Ref Range Status   SARS Coronavirus 2 by RT PCR NEGATIVE NEGATIVE Final    Comment: (NOTE) SARS-CoV-2 target nucleic acids are NOT DETECTED.  The SARS-CoV-2 RNA is generally detectable in upper respiratory specimens during the acute phase of infection. The lowest concentration of SARS-CoV-2 viral copies this assay can detect is 138 copies/mL. A negative result does not preclude SARS-Cov-2 infection and should not be used as the sole basis for treatment or other patient management decisions. A negative result may occur with  improper specimen collection/handling, submission of specimen other than nasopharyngeal swab, presence of viral mutation(s) within the areas targeted by this assay, and inadequate number of viral copies(<138 copies/mL). A negative result must be combined with clinical observations, patient history, and epidemiological information. The expected result is Negative.  Fact Sheet for Patients:  EntrepreneurPulse.com.au  Fact Sheet for Healthcare Providers:  IncredibleEmployment.be  This test is no t yet approved or cleared by the [place] FDA and  has been authorized for detection and/or diagnosis of SARS-CoV-2 by FDA under an Emergency Use Authorization  (EUA). This EUA will remain  in effect (meaning this test can be used) for the duration of the COVID-19 declaration under Section 564(b)(1) of the Act, 21 U.S.C.section 360bbb-3(b)(1), unless the authorization is terminated  or revoked sooner.       Influenza A by PCR NEGATIVE NEGATIVE Final   Influenza B by PCR NEGATIVE NEGATIVE Final    Comment: (NOTE) The Xpert Xpress SARS-CoV-2/FLU/RSV plus assay is intended as an aid in the diagnosis of influenza from Nasopharyngeal swab specimens and should not be used as a sole basis for treatment. Nasal washings and aspirates are unacceptable for Xpert Xpress SARS-CoV-2/FLU/RSV testing.  Fact Sheet for Patients: EntrepreneurPulse.com.au  Fact Sheet for Healthcare Providers: IncredibleEmployment.be  This test is not yet approved or cleared by the [place] FDA and has been authorized for detection and/or diagnosis of SARS-CoV-2 by FDA under an Emergency Use Authorization (EUA). This EUA will remain in effect (meaning this test can be used) for the duration of the COVID-19 declaration under Section 564(b)(1)  of the Act, 21 U.S.C. section 360bbb-3(b)(1), unless the authorization is terminated or revoked.     Resp Syncytial Virus by PCR NEGATIVE NEGATIVE Final    Comment: (NOTE) Fact Sheet for Patients: EntrepreneurPulse.com.au  Fact Sheet for Healthcare Providers: IncredibleEmployment.be  This test is not yet approved or cleared by the [place] FDA and has been authorized for detection and/or diagnosis of SARS-CoV-2 by FDA under an Emergency Use Authorization (EUA). This EUA will remain in effect (meaning this test can be used) for the duration of the COVID-19 declaration under Section 564(b)(1) of the Act, 21 U.S.C. section 360bbb-3(b)(1), unless the authorization is terminated or revoked.  Performed at [facility], [address]  [place], [place] [address]   Blood Culture (routine x 2)     Status: None (Preliminary result)   Collection Time: 04/16/22 12:57 AM   Specimen: Right Antecubital; Blood  Result Value Ref Range Status   Specimen Description RIGHT ANTECUBITAL  Final   Special Requests   Final    BOTTLES DRAWN AEROBIC AND ANAEROBIC Blood Culture adequate volume   Culture   Final    NO GROWTH 1 DAY Performed at [facility], [address], [place], [place] [address]    Report Status PENDING  Incomplete    Coagulation Studies: Recent Labs    04/15/22 2316  LABPROT 13.4  INR 1.0    Urinalysis: No results for input(s): "COLORURINE", "LABSPEC", "PHURINE", "GLUCOSEU", "HGBUR", "BILIRUBINUR", "KETONESUR", "PROTEINUR", "UROBILINOGEN", "NITRITE", "LEUKOCYTESUR" in the last 72 hours.  Invalid input(s): "APPERANCEUR"    Imaging: DG Chest Portable 1 View  Result Date: 04/15/2022 CLINICAL DATA:  Acute hypoxia. Dyspnea. Cough for 1 week. 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 Stable heart size and mediastinal contours. Mild diffuse interstitial coarsening with slight ill-defined opacities in the right mid lung and both lower lung zones. No pleural effusion or pneumothorax. No acute osseous findings. IMPRESSION: Ill-defined opacities in the right mid lung and both lower lung zones, atelectasis or possible atypical infection. Electronically Signed   By: Keith Rake M.D.   On: 04/15/2022 23:45     Assessment & Plan: Mr. Skyeler Smola is a 81 y.o.  male with past medical history of diabetes, hypertension, anemia, COPD, and chronic systolic heart failure, who was admitted to [facility] on 04/15/2022 for Acute respiratory failure with hypoxia ([place]) [J96.01]  Acute kidney injury likely secondary to hypovolemia, prolonged nausea and vomiting.  Baseline appears to 3 with GFR 20 on 11/01/21.  Will order renal ultrasound to rule out obstruction.  No IV  contrast exposure.  Patient states he does not follow with nephrology, [facility] patient.  Denies complaints of nausea since admission.  Tolerating meals.  Will offer IV hydration at 50 mL/h overnight.  Continue to avoid nephrotoxic agents and therapies, including hypotension.  No acute need for dialysis at this time.  Monitoring closely.  2. Anemia of chronic kidney disease Lab Results  Component Value Date   HGB 10.7 (L) 04/16/2022    Hemoglobin above desired target.  Will continue to monitor.  3.  Hypertension with chronic kidney disease.  Home regimen includes amlodipine, furosemide, lisinopril, and carvedilol.  Only receiving carvedilol at this time.  4. 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 Home regimen includes glargine. Most recent hemoglobin A1c is 6.0 on 04/16/22.   LOS: 1 Shari Natt 12/11/202310:48 AM

## 2022-04-17 NOTE — ED Notes (Signed)
Per request pt received gram crackers

## 2022-04-18 ENCOUNTER — Encounter: Payer: Self-pay | Admitting: Internal Medicine

## 2022-04-18 DIAGNOSIS — J189 Pneumonia, unspecified organism: Secondary | ICD-10-CM | POA: Diagnosis present

## 2022-04-18 LAB — MRSA NEXT GEN BY PCR, NASAL: MRSA by PCR Next Gen: NOT DETECTED

## 2022-04-18 LAB — BASIC METABOLIC PANEL
Anion gap: 7 (ref 5–15)
BUN: 89 mg/dL — ABNORMAL HIGH (ref 8–23)
CO2: 21 mmol/L — ABNORMAL LOW (ref 22–32)
Calcium: 7.7 mg/dL — ABNORMAL LOW (ref 8.9–10.3)
Chloride: 112 mmol/L — ABNORMAL HIGH (ref 98–111)
Creatinine, Ser: 4.48 mg/dL — ABNORMAL HIGH (ref 0.61–1.24)
GFR, Estimated: 13 mL/min — ABNORMAL LOW (ref >60)
Glucose, Bld: 83 mg/dL (ref 70–99)
Potassium: 4.5 mmol/L (ref 3.5–5.1)
Sodium: 140 mmol/L (ref 135–145)

## 2022-04-18 LAB — CBC
HCT: 27.5 % — ABNORMAL LOW (ref 39.0–52.0)
Hemoglobin: 8.9 g/dL — ABNORMAL LOW (ref 13.0–17.0)
MCH: 30.8 pg (ref 26.0–34.0)
MCHC: 32.4 g/dL (ref 30.0–36.0)
MCV: 95.2 fL (ref 80.0–100.0)
Platelets: 160 10*3/uL (ref 150–400)
RBC: 2.89 MIL/uL — ABNORMAL LOW (ref 4.22–5.81)
RDW: 14.6 % (ref 11.5–15.5)
WBC: 10.9 10*3/uL — ABNORMAL HIGH (ref 4.0–10.5)
nRBC: 0 % (ref 0.0–0.2)

## 2022-04-18 LAB — GLUCOSE, CAPILLARY
Glucose-Capillary: 117 mg/dL — ABNORMAL HIGH (ref 70–99)
Glucose-Capillary: 63 mg/dL — ABNORMAL LOW (ref 70–99)
Glucose-Capillary: 96 mg/dL (ref 70–99)

## 2022-04-18 LAB — CBG MONITORING, ED
Glucose-Capillary: 68 mg/dL — ABNORMAL LOW (ref 70–99)
Glucose-Capillary: 95 mg/dL (ref 70–99)
Glucose-Capillary: 79 mg/dL (ref 70–99)

## 2022-04-18 LAB — MAGNESIUM: Magnesium: 2 mg/dL (ref 1.7–2.4)

## 2022-04-18 MED ORDER — SODIUM CHLORIDE 0.9 % IV SOLN: INTRAVENOUS | Status: DC

## 2022-04-18 MED ORDER — GUAIFENESIN ER 600 MG PO TB12
600.0000 mg | ORAL_TABLET | Freq: Two times a day (BID) | ORAL | Status: DC
  Administered 2022-04-18: 600 mg via ORAL
  Filled 2022-04-18: qty 1

## 2022-04-18 MED ORDER — ARFORMOTEROL TARTRATE 15 MCG/2ML IN NEBU
15.0000 ug | INHALATION_SOLUTION | Freq: Two times a day (BID) | RESPIRATORY_TRACT | Status: DC
  Administered 2022-04-18 – 2022-04-19 (×3): 15 ug via RESPIRATORY_TRACT
  Filled 2022-04-18 (×4): qty 2

## 2022-04-18 MED ORDER — SACCHAROMYCES BOULARDII 250 MG PO CAPS
250.0000 mg | ORAL_CAPSULE | Freq: Two times a day (BID) | ORAL | Status: DC
  Administered 2022-04-18 – 2022-04-19 (×3): 250 mg via ORAL
  Filled 2022-04-18 (×4): qty 1

## 2022-04-18 MED ORDER — BUDESONIDE 0.25 MG/2ML IN SUSP
0.2500 mg | Freq: Two times a day (BID) | RESPIRATORY_TRACT | Status: DC
  Administered 2022-04-18 – 2022-04-19 (×4): 0.25 mg via RESPIRATORY_TRACT
  Filled 2022-04-18 (×4): qty 2

## 2022-04-18 MED ORDER — GUAIFENESIN ER 600 MG PO TB12
1200.0000 mg | ORAL_TABLET | Freq: Two times a day (BID) | ORAL | Status: DC
  Administered 2022-04-18 – 2022-04-19 (×3): 1200 mg via ORAL
  Filled 2022-04-18 (×4): qty 2

## 2022-04-18 MED ORDER — INSULIN GLARGINE-YFGN 100 UNIT/ML ~~LOC~~ SOLN
10.0000 [IU] | Freq: Every day | SUBCUTANEOUS | Status: DC
  Administered 2022-04-18: 10 [IU] via SUBCUTANEOUS
  Filled 2022-04-18 (×2): qty 0.1

## 2022-04-18 MED ORDER — ORAL CARE MOUTH RINSE: 15.0000 mL | OROMUCOSAL | Status: DC | PRN

## 2022-04-18 NOTE — ED Notes (Signed)
Request made for transport to the floor ?

## 2022-04-18 NOTE — Progress Notes (Signed)
[facility]  ROUNDING NOTE   Subjective:   Currently eating breakfast Remains on room air Currently coughing, states bacon went the wrong way No lower extremity edema Mild wheeze  Objective:  Vital signs in last 24 hours:  Temp:  [97.4 F (36.3 C)-98.3 F (36.8 C)] 98 F (36.7 C) (12/12 1200) Pulse Rate:  [37-67] 59 (12/12 1200) Resp:  [13-23] 15 (12/12 1200) BP: (98-136)/(46-75) 116/56 (12/12 1200) SpO2:  [84 %-100 %] 94 % (12/12 1200)  Weight change:  Filed Weights   04/15/22 2259  Weight: 71.2 kg    Intake/Output: I/O last 3 completed shifts: In: 1195.4 [I.V.:558.9; IV Piggyback:636.5] Out: 900 [Urine:900]   Intake/Output this shift:  Total I/O In: 740 [P.O.:740] Out: -   Physical Exam: General: NAD  Head: Normocephalic, atraumatic. Dry oral mucosal membranes  Eyes: Anicteric  Lungs:  Bilateral upper lobe wheeze, normal effort, room air  Heart: Regular rate and rhythm  Abdomen:  Soft, nontender  Extremities: No peripheral edema.  Neurologic: Nonfocal, moving all four extremities  Skin: No lesions, dry  Access: None    Basic Metabolic Panel: Recent Labs  Lab 04/15/22 2316 04/16/22 0237 04/17/22 1759 04/18/22 0853  NA 142 139  --  140  K 4.8 4.8  --  4.5  CL 110 110  --  112*  CO2 20* 18*  --  21*  GLUCOSE 160* 363*  --  83  BUN 86* 88*  --  89*  CREATININE 4.92* 4.83*  --  4.48*  CALCIUM 8.7* 8.0*  --  7.7*  MG  --   --  1.9 2.0    Liver Function Tests: Recent Labs  Lab 04/15/22 2316  AST 13*  ALT 11  ALKPHOS 60  BILITOT 0.6  PROT 7.1  ALBUMIN 4.0   Recent Labs  Lab 04/15/22 2316  LIPASE 39   No results for input(s): "AMMONIA" in the last 168 hours.  CBC: Recent Labs  Lab 04/15/22 2316 04/16/22 0237 04/18/22 0853  WBC 7.1 11.4* 10.9*  NEUTROABS 5.3  --   --   HGB 11.9* 10.7* 8.9*  HCT 37.7* 33.5* 27.5*  MCV 95.9 95.4 95.2  PLT 241 184 160    Cardiac Enzymes: No results for input(s): "CKTOTAL",  "CKMB", "CKMBINDEX", "TROPONINI" in the last 168 hours.  BNP: Invalid input(s): "POCBNP"  CBG: Recent Labs  Lab 04/17/22 1737 04/17/22 2110 04/18/22 0736 04/18/22 0858 04/18/22 1228  GLUCAP 101* 115* 68* 95 76    Microbiology: Results for orders placed or performed during the hospital encounter of 04/15/22  Resp panel by RT-PCR (RSV, Flu A&B, Covid) Anterior Nasal Swab     Status: None   Collection Time: 04/15/22 11:29 PM   Specimen: Anterior Nasal Swab  Result Value Ref Range Status   SARS Coronavirus 2 by RT PCR NEGATIVE NEGATIVE Final    Comment: (NOTE) SARS-CoV-2 target nucleic acids are NOT DETECTED.  The SARS-CoV-2 RNA is generally detectable in upper respiratory specimens during the acute phase of infection. The lowest concentration of SARS-CoV-2 viral copies this assay can detect is 138 copies/mL. A negative result does not preclude SARS-Cov-2 infection and should not be used as the sole basis for treatment or other patient management decisions. A negative result may occur with  improper specimen collection/handling, submission of specimen other than nasopharyngeal swab, presence of viral mutation(s) within the areas targeted by this assay, and inadequate number of viral copies(<138 copies/mL). A negative result must be combined with clinical observations,  patient history, and epidemiological information. The expected result is Negative.  Fact Sheet for Patients:  EntrepreneurPulse.com.au  Fact Sheet for Healthcare Providers:  IncredibleEmployment.be  This test is no t yet approved or cleared by the [place] FDA and  has been authorized for detection and/or diagnosis of SARS-CoV-2 by FDA under an Emergency Use Authorization (EUA). This EUA will remain  in effect (meaning this test can be used) for the duration of the COVID-19 declaration under Section 564(b)(1) of the Act, 21 U.S.C.section 360bbb-3(b)(1), unless the  authorization is terminated  or revoked sooner.       Influenza A by PCR NEGATIVE NEGATIVE Final   Influenza B by PCR NEGATIVE NEGATIVE Final    Comment: (NOTE) The Xpert Xpress SARS-CoV-2/FLU/RSV plus assay is intended as an aid in the diagnosis of influenza from Nasopharyngeal swab specimens and should not be used as a sole basis for treatment. Nasal washings and aspirates are unacceptable for Xpert Xpress SARS-CoV-2/FLU/RSV testing.  Fact Sheet for Patients: EntrepreneurPulse.com.au  Fact Sheet for Healthcare Providers: IncredibleEmployment.be  This test is not yet approved or cleared by the [place] FDA and has been authorized for detection and/or diagnosis of SARS-CoV-2 by FDA under an Emergency Use Authorization (EUA). This EUA will remain in effect (meaning this test can be used) for the duration of the COVID-19 declaration under Section 564(b)(1) of the Act, 21 U.S.C. section 360bbb-3(b)(1), unless the authorization is terminated or revoked.     Resp Syncytial Virus by PCR NEGATIVE NEGATIVE Final    Comment: (NOTE) Fact Sheet for Patients: EntrepreneurPulse.com.au  Fact Sheet for Healthcare Providers: IncredibleEmployment.be  This test is not yet approved or cleared by the [place] FDA and has been authorized for detection and/or diagnosis of SARS-CoV-2 by FDA under an Emergency Use Authorization (EUA). This EUA will remain in effect (meaning this test can be used) for the duration of the COVID-19 declaration under Section 564(b)(1) of the Act, 21 U.S.C. section 360bbb-3(b)(1), unless the authorization is terminated or revoked.  Performed at [facility], [address] [place], [place] [address]   Blood Culture (routine x 2)     Status: None (Preliminary result)   Collection Time: 04/16/22 12:57 AM   Specimen: Right Antecubital; Blood  Result Value Ref Range Status    Specimen Description RIGHT ANTECUBITAL  Final   Special Requests   Final    BOTTLES DRAWN AEROBIC AND ANAEROBIC Blood Culture adequate volume   Culture   Final    NO GROWTH 1 DAY Performed at [facility], [address] [place], [place] [address]    Report Status PENDING  Incomplete    Coagulation Studies: Recent Labs    04/15/22 2316  LABPROT 13.4  INR 1.0    Urinalysis: No results for input(s): "COLORURINE", "LABSPEC", "PHURINE", "GLUCOSEU", "HGBUR", "BILIRUBINUR", "KETONESUR", "PROTEINUR", "UROBILINOGEN", "NITRITE", "LEUKOCYTESUR" in the last 72 hours.  Invalid input(s): "APPERANCEUR"    Imaging: US RENAL  Result Date: 04/17/2022 CLINICAL DATA:  Acute kidney injury. EXAM: RENAL / URINARY TRACT ULTRASOUND COMPLETE COMPARISON:  Renal ultrasound 06/26/2021 FINDINGS: Right Kidney: Renal measurements: 9.9 x 4.5 x 3.9 cm = volume: 91 mL. Echogenicity within normal limits. No mass or hydronephrosis visualized. Small volume perinephric fluid. Left Kidney: Renal measurements: 9.8 x 5.3 x 4.3 cm = volume: 116 mL. Echogenicity within normal limits. No mass or hydronephrosis visualized. Small volume perinephric fluid. Bladder: Appears normal for degree of bladder distention. Other: None. IMPRESSION: 1. No hydronephrosis. 2. Small volume bilateral  perinephric fluid. Electronically Signed   By: Logan Bores M.D.   On: 04/17/2022 18:13   ECHOCARDIOGRAM COMPLETE  Result Date: 04/17/2022    ECHOCARDIOGRAM REPORT   Patient Name:   Phillip Reyes Date of Exam: 04/17/2022 Medical Rec #:  [id number]     Height:       68.0 in Accession #:    [id number]    Weight:       157.0 lb Date of Birth:  18-Feb-1941    BSA:          1.844 m Patient Age:    89 years      BP:           107/64 mmHg Patient Gender: M             HR:           63 bpm. Exam Location:  [facility] Procedure: 2D Echo, Cardiac Doppler and Color Doppler Indications:     CHF ---acute systolic G29.52  History:         Patient has prior  history of Echocardiogram examinations, most                  recent 06/26/2021.  Sonographer:     Sherrie Sport Referring Phys:  [id number] Athena Masse Diagnosing Phys: Yolonda Kida MD  Sonographer Comments: Suboptimal parasternal window and Technically challenging study due to limited acoustic windows. IMPRESSIONS  1. Borderline apical hypo. Normal Overall LVF.  2. Left ventricular ejection fraction, by estimation, is 55 to 60%. The left ventricle has normal function. The left ventricle has no regional wall motion abnormalities. Left ventricular diastolic parameters are consistent with Grade I diastolic dysfunction (impaired relaxation).  3. Right ventricular systolic function is normal. The right ventricular size is normal.  4. The mitral valve is normal in structure. Trivial mitral valve regurgitation.  5. The aortic valve is normal in structure. Aortic valve regurgitation is not visualized. FINDINGS  Left Ventricle: Left ventricular ejection fraction, by estimation, is 55 to 60%. The left ventricle has normal function. The left ventricle has no regional wall motion abnormalities. The left ventricular internal cavity size was normal in size. There is  borderline left ventricular hypertrophy. Left ventricular diastolic parameters are consistent with Grade I diastolic dysfunction (impaired relaxation). Right Ventricle: The right ventricular size is normal. No increase in right ventricular wall thickness. Right ventricular systolic function is normal. Left Atrium: Left atrial size was normal in size. Right Atrium: Right atrial size was normal in size. Pericardium: There is no evidence of pericardial effusion. Mitral Valve: The mitral valve is normal in structure. Trivial mitral valve regurgitation. Tricuspid Valve: The tricuspid valve is normal in structure. Tricuspid valve regurgitation is trivial. Aortic Valve: The aortic valve is normal in structure. Aortic valve regurgitation is not visualized. Aortic valve  mean gradient measures 8.3 mmHg. Aortic valve peak gradient measures 13.4 mmHg. Aortic valve area, by VTI measures 1.32 cm. Pulmonic Valve: The pulmonic valve was normal in structure. Pulmonic valve regurgitation is not visualized. Aorta: The ascending aorta was not well visualized. IAS/Shunts: No atrial level shunt detected by color flow Doppler. Additional Comments: Borderline apical hypo. Normal Overall LVF.  LEFT VENTRICLE PLAX 2D LVIDd:         4.40 cm   Diastology LVIDs:         3.10 cm   LV e' medial:    6.53 cm/s LV PW:         1.20 cm  LV E/e' medial:  12.1 LV IVS:        1.00 cm   LV e' lateral:   6.64 cm/s LVOT diam:     2.00 cm   LV E/e' lateral: 11.9 LV SV:         57 LV SV Index:   31 LVOT Area:     3.14 cm  RIGHT VENTRICLE RV Basal diam:  3.10 cm RV Mid diam:    2.90 cm RV S prime:     15.60 cm/s TAPSE (M-mode): 2.2 cm LEFT ATRIUM             Index        RIGHT ATRIUM           Index LA diam:        3.10 cm 1.68 cm/m   RA Area:     11.10 cm LA Vol (A2C):   23.3 ml 12.64 ml/m  RA Volume:   28.10 ml  15.24 ml/m LA Vol (A4C):   19.9 ml 10.79 ml/m LA Biplane Vol: 21.9 ml 11.88 ml/m  AORTIC VALVE AV Area (Vmax):    1.19 cm AV Area (Vmean):   1.22 cm AV Area (VTI):     1.32 cm AV Vmax:           183.00 cm/s AV Vmean:          134.000 cm/s AV VTI:            0.432 m AV Peak Grad:      13.4 mmHg AV Mean Grad:      8.3 mmHg LVOT Vmax:         69.50 cm/s LVOT Vmean:        52.000 cm/s LVOT VTI:          0.182 m LVOT/AV VTI ratio: 0.42  AORTA Ao Root diam: 3.17 cm MITRAL VALVE                TRICUSPID VALVE MV Area (PHT): 4.04 cm     TR Peak grad:   23.8 mmHg MV Decel Time: 188 msec     TR Vmax:        244.00 cm/s MV E velocity: 79.10 cm/s MV A velocity: 111.00 cm/s  SHUNTS MV E/A ratio:  0.71         Systemic VTI:  0.18 m                             Systemic Diam: 2.00 cm Yolonda Kida MD Electronically signed by Yolonda Kida MD Signature Date/Time: 04/17/2022/4:24:26 PM    Final       Medications:    sodium chloride 50 mL/hr at 04/18/22 0912   azithromycin Stopped (04/18/22 0224)   cefTRIAXone (ROCEPHIN)  IV Stopped (04/18/22 0105)    arformoterol  15 mcg Nebulization BID   aspirin EC  81 mg Oral Daily   atorvastatin  80 mg Oral QHS   budesonide (PULMICORT) nebulizer solution  0.25 mg Nebulization BID   carvedilol  6.25 mg Oral BID WC   citalopram  20 mg Oral Daily   guaiFENesin  600 mg Oral BID   heparin injection (subcutaneous)  5,000 Units Subcutaneous Q8H   insulin aspart  0-15 Units Subcutaneous TID WC   insulin aspart  0-5 Units Subcutaneous QHS   insulin glargine-yfgn  10 Units Subcutaneous Daily   ipratropium-albuterol  3 mL Nebulization Q6H  saccharomyces boulardii  250 mg Oral BID   tamsulosin  0.8 mg Oral QHS   acetaminophen **OR** acetaminophen, albuterol, ondansetron **OR** ondansetron (ZOFRAN) IV  Assessment/ Plan:  Mr. Phillip Reyes is a 81 y.o.  male with past medical history of diabetes, hypertension, anemia, COPD, and chronic systolic heart failure, who was admitted to [facility] on 04/15/2022 for Acute respiratory failure with hypoxia ([place]) [J96.01] Pneumonia [J18.9]   Acute kidney injury likely secondary to hypovolemia, prolonged nausea and vomiting.  Baseline appears to 3 with GFR 20 on 11/01/21.  Will order renal ultrasound to rule out obstruction.  No IV contrast exposure.  Patient states he does follow with nephrology at [facility].   Creatinine slightly improved with iVF. Will continue fluids for now at low rate due to heart failure. No acute need for dialysis. Continue to avoid nephrotoxic agents and therapies. Will continue to monitor.   Lab Results  Component Value Date   CREATININE 4.48 (H) 04/18/2022   CREATININE 4.83 (H) 04/16/2022   CREATININE 4.92 (H) 04/15/2022    Intake/Output Summary (Last 24 hours) at 04/18/2022 1507 Last data filed at 04/18/2022 1348 Gross per 24 hour  Intake 1935.44 ml  Output 300 ml  Net 1635.44 ml   2.  Anemia of chronic kidney disease Lab Results  Component Value Date   HGB 8.9 (L) 04/18/2022  Hemoglobin below desired target.  Will continue to monitor for now  3.  Hypertension with chronic kidney disease.  Home regimen includes amlodipine, furosemide, lisinopril, and carvedilol.  Only receiving carvedilol at this time.  Blood pressure currently 116/56.   4. 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 Home regimen includes glargine. Most recent hemoglobin A1c is 6.0 on 04/16/22.     LOS: 2 Keyron Pokorski 12/12/20233:07 PM

## 2022-04-18 NOTE — Progress Notes (Signed)
PROGRESS NOTE    Phillip Reyes  WUJ:[id number] DOB: 1940/05/11 DOA: 04/15/2022 PCP: [facility]   Brief Narrative: 81 year old with past medical history significant for diabetes type 2, hypertension, CKD stage IV, COPD, heart failure reduced ejection fraction admitted with pneumonia AKI on CKD stage IV.   Assessment & Plan:   Principal Problem:   Acute respiratory failure with hypoxia (HCC) Active Problems:   CAP (community acquired pneumonia)   COPD with acute exacerbation ([place])   Acute renal failure superimposed on stage 4 chronic kidney disease (HCC)   Chronic systolic CHF (congestive heart failure) (HCC)   Cardiac defibrillator in situ   Insulin dependent type 2 diabetes mellitus, controlled (HCC)   HTN (hypertension)   Anemia of chronic kidney failure, stage 4 (severe) (HCC)   1-Acute Hypoxic Respiratory Failure secondary to CAP and COPD exacerbation -Continue with IV ceftriaxone and azithromycin -Continue with nebulizers -Improving   AKI on CKD IV  Prior GFR 15--17 Cr range 3.0---3.5 Cr peak to 4.9----4.4 Renal ultrasound negative for hydronephrosis Continue with a strict I's and O's Nephrology consulted and started IV fluids Urine output 300 cc documented since yesterday  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  Diabetes type 2: Continue with Glargine, changed to daily from twice daily due to hypoglycemia Continue with a sliding scale insulin.  Discontinue meal coverage  Diarrhea; start florastore   Denies abdominal pain.    Estimated body mass index is 23.87 kg/m as calculated from the following:   Height as of this encounter: '5\' 8"'$  (1.727 m).   Weight as of this encounter: 71.2 kg.   DVT prophylaxis: Heparin Code Status: Full code Family Communication: Care discussed with patient Disposition Plan:  Status is: Inpatient Remains inpatient appropriate  because: Management of AKI and pneumonia    Consultants:  Nephrology  Procedures:  Renal ultrasound  Antimicrobials:    Subjective: He is breathing better, cough improved.  He report loose stool/   Objective: Vitals:   04/18/22 0423 04/18/22 0500 04/18/22 0600 04/18/22 0700  BP:  (!) 112/50 (!) 122/56 (!) 134/55  Pulse:  (!) 59 61 (!) 59  Resp:  '17 19 18  '$ Temp: 97.8 F (36.6 C)   (!) 97.4 F (36.3 C)  TempSrc: Oral     SpO2:  96% 97% 96%  Weight:      Height:        Intake/Output Summary (Last 24 hours) at 04/18/2022 0900 Last data filed at 04/18/2022 0700 Gross per 24 hour  Intake 1195.44 ml  Output 300 ml  Net 895.44 ml   Filed Weights   04/15/22 2259  Weight: 71.2 kg    Examination:  General exam: Appears calm and comfortable  Respiratory system: Respiratory effort normal. BL ronchus.  Cardiovascular system: S1 & S2 heard,  Gastrointestinal system: Abdomen is nondistended, soft and nontender. No organomegaly or masses felt. Normal bowel sounds heard. Central nervous system: Alert and oriented. No focal neurological deficits. Extremities: Symmetric 5 x 5 power.    Data Reviewed: I have personally reviewed following labs and imaging studies  CBC: Recent Labs  Lab 04/15/22 2316 04/16/22 0237  WBC 7.1 11.4*  NEUTROABS 5.3  --   HGB 11.9* 10.7*  HCT 37.7* 33.5*  MCV 95.9 95.4  PLT 241 956   Basic Metabolic Panel: Recent Labs  Lab 04/15/22 2316 04/16/22 0237 04/17/22 1759  NA 142 139  --   K  4.8 4.8  --   CL 110 110  --   CO2 20* 18*  --   GLUCOSE 160* 363*  --   BUN 86* 88*  --   CREATININE 4.92* 4.83*  --   CALCIUM 8.7* 8.0*  --   MG  --   --  1.9   GFR: Estimated Creatinine Clearance: 11.6 mL/min (A) (by C-G formula based on SCr of 4.83 mg/dL (H)). Liver Function Tests: Recent Labs  Lab 04/15/22 2316  AST 13*  ALT 11  ALKPHOS 60  BILITOT 0.6  PROT 7.1  ALBUMIN 4.0   Recent Labs  Lab 04/15/22 2316  LIPASE 39   No  results for input(s): "AMMONIA" in the last 168 hours. Coagulation Profile: Recent Labs  Lab 04/15/22 2316  INR 1.0   Cardiac Enzymes: No results for input(s): "CKTOTAL", "CKMB", "CKMBINDEX", "TROPONINI" in the last 168 hours. BNP (last 3 results) No results for input(s): "PROBNP" in the last 8760 hours. HbA1C: Recent Labs    04/16/22 0237  HGBA1C 6.0*   CBG: Recent Labs  Lab 04/17/22 1117 04/17/22 1737 04/17/22 2110 04/18/22 0736 04/18/22 0858  GLUCAP 122* 101* 115* 68* 95   Lipid Profile: No results for input(s): "CHOL", "HDL", "LDLCALC", "TRIG", "CHOLHDL", "LDLDIRECT" in the last 72 hours. Thyroid Function Tests: No results for input(s): "TSH", "T4TOTAL", "FREET4", "T3FREE", "THYROIDAB" in the last 72 hours. Anemia Panel: No results for input(s): "VITAMINB12", "FOLATE", "FERRITIN", "TIBC", "IRON", "RETICCTPCT" in the last 72 hours. Sepsis Labs: Recent Labs  Lab 04/16/22 0237  PROCALCITON 2.03    Recent Results (from the past 240 hour(s))  Resp panel by RT-PCR (RSV, Flu A&B, Covid) Anterior Nasal Swab     Status: None   Collection Time: 04/15/22 11:29 PM   Specimen: Anterior Nasal Swab  Result Value Ref Range Status   SARS Coronavirus 2 by RT PCR NEGATIVE NEGATIVE Final    Comment: (NOTE) SARS-CoV-2 target nucleic acids are NOT DETECTED.  The SARS-CoV-2 RNA is generally detectable in upper respiratory specimens during the acute phase of infection. The lowest concentration of SARS-CoV-2 viral copies this assay can detect is 138 copies/mL. A negative result does not preclude SARS-Cov-2 infection and should not be used as the sole basis for treatment or other patient management decisions. A negative result may occur with  improper specimen collection/handling, submission of specimen other than nasopharyngeal swab, presence of viral mutation(s) within the areas targeted by this assay, and inadequate number of viral copies(<138 copies/mL). A negative result must  be combined with clinical observations, patient history, and epidemiological information. The expected result is Negative.  Fact Sheet for Patients:  EntrepreneurPulse.com.au  Fact Sheet for Healthcare Providers:  IncredibleEmployment.be  This test is no t yet approved or cleared by the [place] FDA and  has been authorized for detection and/or diagnosis of SARS-CoV-2 by FDA under an Emergency Use Authorization (EUA). This EUA will remain  in effect (meaning this test can be used) for the duration of the COVID-19 declaration under Section 564(b)(1) of the Act, 21 U.S.C.section 360bbb-3(b)(1), unless the authorization is terminated  or revoked sooner.       Influenza A by PCR NEGATIVE NEGATIVE Final   Influenza B by PCR NEGATIVE NEGATIVE Final    Comment: (NOTE) The Xpert Xpress SARS-CoV-2/FLU/RSV plus assay is intended as an aid in the diagnosis of influenza from Nasopharyngeal swab specimens and should not be used as a sole basis for treatment. Nasal washings and aspirates are unacceptable for Xpert  Xpress SARS-CoV-2/FLU/RSV testing.  Fact Sheet for Patients: EntrepreneurPulse.com.au  Fact Sheet for Healthcare Providers: IncredibleEmployment.be  This test is not yet approved or cleared by the [place] FDA and has been authorized for detection and/or diagnosis of SARS-CoV-2 by FDA under an Emergency Use Authorization (EUA). This EUA will remain in effect (meaning this test can be used) for the duration of the COVID-19 declaration under Section 564(b)(1) of the Act, 21 U.S.C. section 360bbb-3(b)(1), unless the authorization is terminated or revoked.     Resp Syncytial Virus by PCR NEGATIVE NEGATIVE Final    Comment: (NOTE) Fact Sheet for Patients: EntrepreneurPulse.com.au  Fact Sheet for Healthcare Providers: IncredibleEmployment.be  This test is not  yet approved or cleared by the [place] FDA and has been authorized for detection and/or diagnosis of SARS-CoV-2 by FDA under an Emergency Use Authorization (EUA). This EUA will remain in effect (meaning this test can be used) for the duration of the COVID-19 declaration under Section 564(b)(1) of the Act, 21 U.S.C. section 360bbb-3(b)(1), unless the authorization is terminated or revoked.  Performed at [facility], [address] [place], [place] [address]   Blood Culture (routine x 2)     Status: None (Preliminary result)   Collection Time: 04/16/22 12:57 AM   Specimen: Right Antecubital; Blood  Result Value Ref Range Status   Specimen Description RIGHT ANTECUBITAL  Final   Special Requests   Final    BOTTLES DRAWN AEROBIC AND ANAEROBIC Blood Culture adequate volume   Culture   Final    NO GROWTH 1 DAY Performed at [facility], [address], [place], [place] [address]    Report Status PENDING  Incomplete         Radiology Studies: US RENAL  Result Date: 04/17/2022 CLINICAL DATA:  Acute kidney injury. EXAM: RENAL / URINARY TRACT ULTRASOUND COMPLETE COMPARISON:  Renal ultrasound 06/26/2021 FINDINGS: Right Kidney: Renal measurements: 9.9 x 4.5 x 3.9 cm = volume: 91 mL. Echogenicity within normal limits. No mass or hydronephrosis visualized. Small volume perinephric fluid. Left Kidney: Renal measurements: 9.8 x 5.3 x 4.3 cm = volume: 116 mL. Echogenicity within normal limits. No mass or hydronephrosis visualized. Small volume perinephric fluid. Bladder: Appears normal for degree of bladder distention. Other: None. IMPRESSION: 1. No hydronephrosis. 2. Small volume bilateral perinephric fluid. Electronically Signed   By: Logan Bores M.D.   On: 04/17/2022 18:13   ECHOCARDIOGRAM COMPLETE  Result Date: 04/17/2022    ECHOCARDIOGRAM REPORT   Patient Name:   Phillip Reyes Date of Exam: 04/17/2022 Medical Rec #:  [id number]     Height:       68.0 in Accession  #:    [id number]    Weight:       157.0 lb Date of Birth:  1940-05-28    BSA:          1.844 m Patient Age:    35 years      BP:           107/64 mmHg Patient Gender: M             HR:           63 bpm. Exam Location:  [facility] Procedure: 2D Echo, Cardiac Doppler and Color Doppler Indications:     CHF ---acute systolic Y18.56  History:         Patient has prior history of Echocardiogram examinations, most  recent 06/26/2021.  Sonographer:     Sherrie Sport Referring Phys:  [id number] Athena Masse Diagnosing Phys: Yolonda Kida MD  Sonographer Comments: Suboptimal parasternal window and Technically challenging study due to limited acoustic windows. IMPRESSIONS  1. Borderline apical hypo. Normal Overall LVF.  2. Left ventricular ejection fraction, by estimation, is 55 to 60%. The left ventricle has normal function. The left ventricle has no regional wall motion abnormalities. Left ventricular diastolic parameters are consistent with Grade I diastolic dysfunction (impaired relaxation).  3. Right ventricular systolic function is normal. The right ventricular size is normal.  4. The mitral valve is normal in structure. Trivial mitral valve regurgitation.  5. The aortic valve is normal in structure. Aortic valve regurgitation is not visualized. FINDINGS  Left Ventricle: Left ventricular ejection fraction, by estimation, is 55 to 60%. The left ventricle has normal function. The left ventricle has no regional wall motion abnormalities. The left ventricular internal cavity size was normal in size. There is  borderline left ventricular hypertrophy. Left ventricular diastolic parameters are consistent with Grade I diastolic dysfunction (impaired relaxation). Right Ventricle: The right ventricular size is normal. No increase in right ventricular wall thickness. Right ventricular systolic function is normal. Left Atrium: Left atrial size was normal in size. Right Atrium: Right atrial size was normal in size.  Pericardium: There is no evidence of pericardial effusion. Mitral Valve: The mitral valve is normal in structure. Trivial mitral valve regurgitation. Tricuspid Valve: The tricuspid valve is normal in structure. Tricuspid valve regurgitation is trivial. Aortic Valve: The aortic valve is normal in structure. Aortic valve regurgitation is not visualized. Aortic valve mean gradient measures 8.3 mmHg. Aortic valve peak gradient measures 13.4 mmHg. Aortic valve area, by VTI measures 1.32 cm. Pulmonic Valve: The pulmonic valve was normal in structure. Pulmonic valve regurgitation is not visualized. Aorta: The ascending aorta was not well visualized. IAS/Shunts: No atrial level shunt detected by color flow Doppler. Additional Comments: Borderline apical hypo. Normal Overall LVF.  LEFT VENTRICLE PLAX 2D LVIDd:         4.40 cm   Diastology LVIDs:         3.10 cm   LV e' medial:    6.53 cm/s LV PW:         1.20 cm   LV E/e' medial:  12.1 LV IVS:        1.00 cm   LV e' lateral:   6.64 cm/s LVOT diam:     2.00 cm   LV E/e' lateral: 11.9 LV SV:         57 LV SV Index:   31 LVOT Area:     3.14 cm  RIGHT VENTRICLE RV Basal diam:  3.10 cm RV Mid diam:    2.90 cm RV S prime:     15.60 cm/s TAPSE (M-mode): 2.2 cm LEFT ATRIUM             Index        RIGHT ATRIUM           Index LA diam:        3.10 cm 1.68 cm/m   RA Area:     11.10 cm LA Vol (A2C):   23.3 ml 12.64 ml/m  RA Volume:   28.10 ml  15.24 ml/m LA Vol (A4C):   19.9 ml 10.79 ml/m LA Biplane Vol: 21.9 ml 11.88 ml/m  AORTIC VALVE AV Area (Vmax):    1.19 cm AV Area (Vmean):   1.22 cm  AV Area (VTI):     1.32 cm AV Vmax:           183.00 cm/s AV Vmean:          134.000 cm/s AV VTI:            0.432 m AV Peak Grad:      13.4 mmHg AV Mean Grad:      8.3 mmHg LVOT Vmax:         69.50 cm/s LVOT Vmean:        52.000 cm/s LVOT VTI:          0.182 m LVOT/AV VTI ratio: 0.42  AORTA Ao Root diam: 3.17 cm MITRAL VALVE                TRICUSPID VALVE MV Area (PHT): 4.04 cm     TR  Peak grad:   23.8 mmHg MV Decel Time: 188 msec     TR Vmax:        244.00 cm/s MV E velocity: 79.10 cm/s MV A velocity: 111.00 cm/s  SHUNTS MV E/A ratio:  0.71         Systemic VTI:  0.18 m                             Systemic Diam: 2.00 cm Dwayne D Callwood MD Electronically signed by Yolonda Kida MD Signature Date/Time: 04/17/2022/4:24:26 PM    Final         Scheduled Meds:  aspirin EC  81 mg Oral Daily   atorvastatin  80 mg Oral QHS   carvedilol  6.25 mg Oral BID WC   citalopram  20 mg Oral Daily   heparin injection (subcutaneous)  5,000 Units Subcutaneous Q8H   insulin aspart  0-15 Units Subcutaneous TID WC   insulin aspart  0-5 Units Subcutaneous QHS   insulin glargine-yfgn  10 Units Subcutaneous BID   ipratropium-albuterol  3 mL Nebulization Q6H   tamsulosin  0.8 mg Oral QHS   Continuous Infusions:  sodium chloride     azithromycin Stopped (04/18/22 0224)   cefTRIAXone (ROCEPHIN)  IV Stopped (04/18/22 0105)     LOS: 2 days    Time spent: 35 minutes    Panzy Bubeck A Joelle Flessner, MD [facility]   If 7PM-7AM, please contact night-coverage [email or web address]  04/18/2022, 9:00 AM

## 2022-04-18 NOTE — Consult Note (Signed)
   Heart Failure Nurse Navigator Note  He presented from [facility] complaints of 1 week of shortness of breath progressively worsening.  BNP 274.  Chest x-ray ill-defined opacities of mid right lung and both lower lung zones ill-defined opacities of the mid right lung and both lower lung zones.  EMS noted his O2 sats to be in the 70s and 80s when they arrived.  HFimEF 55 to 60%.  Grade 1 diastolic dysfunction.  Previous echocardiogram with EF of 40 to 45%.  Comorbidities:  Arthritis COPD Diabetes Hypertension Stroke  Medications:  Aspirin 81 mg daily Atorvastatin 80 mg at bedtime Carvedilol 6.25 mg 2 times a day with meals  Labs:  Sodium 140, potassium 4.5, CO2 21, chloride 112, BUN 89, creatinine 4.48, GFR 13, magnesium 2. Input 1195 mL Output 300 mL Weight not documented   Met with patient in the emergency room, he was lying on a gurney in no acute distress currently on room air.  He states that he and his wife had just moved to [facility].  He relates at home that he has a caregiver who prepares his meals.  He states he has not used salt for a long time nor do they eat foods that are higher in sodium.  Discussed fluid restriction.  Patient states that when at home he does drink 2 pots of coffee a day, each pop making 15 cups.  Stressed the importance of decreasing his coffee consumption.  He does not weigh himself on a daily basis at home.  Dressed the importance of daily weights and what to report.  He has follow-up in the outpatient heart failure clinic on December 19 at 3 PM he has a 0% no-show ratio.  Was given the living with heart failure teaching booklet, zone magnet, info on heart failure and low-sodium along with weight chart.  Patient states that he currently does not had any glasses so he is unable to read at this time.  He had no further questions.  Pricilla Riffle RN CHFN

## 2022-04-18 NOTE — Progress Notes (Addendum)
Inpatient Diabetes Program Recommendations  AACE/ADA: New Consensus Statement on Inpatient Glycemic Control (2015)  Target Ranges:  Prepandial:   less than 140 mg/dL      Peak postprandial:   less than 180 mg/dL (1-2 hours)      Critically ill patients:  140 - 180 mg/dL   Lab Results  Component Value Date   GLUCAP 79 04/18/2022   HGBA1C 6.0 (H) 04/16/2022    Review of Glycemic Control  Latest Reference Range & Units 04/17/22 11:17 04/17/22 17:37 04/17/22 21:10 04/18/22 07:36 04/18/22 08:58 04/18/22 12:28  Glucose-Capillary 70 - 99 mg/dL 122 (H) 101 (H) 115 (H) 68 (L) 95 79   Diabetes history: DM Outpatient Diabetes medications:  Semglee 8 units q HS, Novolin R prn Current orders for Inpatient glycemic control:  Semglee 10 units daily Novolog 0-15 units tid with meals and HS  Inpatient Diabetes Program Recommendations:    Agree with reduction of Semglee to once daily.   Thanks,  Adah Perl, RN, BC-ADM Inpatient Diabetes Coordinator Pager [phone]  (8a-5p)

## 2022-04-19 LAB — GLUCOSE, CAPILLARY
Glucose-Capillary: 75 mg/dL (ref 70–99)
Glucose-Capillary: 149 mg/dL — ABNORMAL HIGH (ref 70–99)
Glucose-Capillary: 39 mg/dL — CL (ref 70–99)
Glucose-Capillary: 63 mg/dL — ABNORMAL LOW (ref 70–99)
Glucose-Capillary: 89 mg/dL (ref 70–99)
Glucose-Capillary: 67 mg/dL — ABNORMAL LOW (ref 70–99)
Glucose-Capillary: 128 mg/dL — ABNORMAL HIGH (ref 70–99)
Glucose-Capillary: 55 mg/dL — ABNORMAL LOW (ref 70–99)

## 2022-04-19 LAB — BASIC METABOLIC PANEL
Anion gap: 7 (ref 5–15)
BUN: 75 mg/dL — ABNORMAL HIGH (ref 8–23)
CO2: 20 mmol/L — ABNORMAL LOW (ref 22–32)
Calcium: 8.1 mg/dL — ABNORMAL LOW (ref 8.9–10.3)
Chloride: 113 mmol/L — ABNORMAL HIGH (ref 98–111)
Creatinine, Ser: 3.95 mg/dL — ABNORMAL HIGH (ref 0.61–1.24)
GFR, Estimated: 15 mL/min — ABNORMAL LOW (ref >60)
Glucose, Bld: 139 mg/dL — ABNORMAL HIGH (ref 70–99)
Potassium: 4.5 mmol/L (ref 3.5–5.1)
Sodium: 140 mmol/L (ref 135–145)

## 2022-04-19 MED ORDER — INSULIN GLARGINE-YFGN 100 UNIT/ML ~~LOC~~ SOLN
7.0000 [IU] | Freq: Every day | SUBCUTANEOUS | Status: DC
  Administered 2022-04-19: 7 [IU] via SUBCUTANEOUS
  Filled 2022-04-19: qty 0.07

## 2022-04-19 MED ORDER — AZITHROMYCIN 250 MG PO TABS: 250.0000 mg | ORAL_TABLET | Freq: Every day | ORAL | 0 refills | Status: AC

## 2022-04-19 MED ORDER — CEFDINIR 300 MG PO CAPS: 300.0000 mg | ORAL_CAPSULE | Freq: Every day | ORAL | 0 refills | Status: AC

## 2022-04-19 MED ORDER — GLUCOSE 40 % PO GEL
ORAL | Status: AC
  Filled 2022-04-19: qty 1

## 2022-04-19 MED ORDER — IPRATROPIUM-ALBUTEROL 0.5-2.5 (3) MG/3ML IN SOLN
3.0000 mL | RESPIRATORY_TRACT | Status: AC
  Administered 2022-04-19: 3 mL via RESPIRATORY_TRACT
  Filled 2022-04-19: qty 3

## 2022-04-19 NOTE — Progress Notes (Signed)
Pt received discharge teaching; he verbalized understanding and satisfaction with care and denied having any further questions at this time.

## 2022-04-19 NOTE — Inpatient Diabetes Management (Signed)
Inpatient Diabetes Program Recommendations  AACE/ADA: New Consensus Statement on Inpatient Glycemic Control (2015)  Target Ranges:  Prepandial:   less than 140 mg/dL      Peak postprandial:   less than 180 mg/dL (1-2 hours)      Critically ill patients:  140 - 180 mg/dL   Lab Results  Component Value Date   GLUCAP 75 04/19/2022   HGBA1C 6.0 (H) 04/16/2022    Review of Glycemic Control  Latest Reference Range & Units 04/18/22 16:51 04/18/22 20:52 04/18/22 21:39 04/19/22 07:55  Glucose-Capillary 70 - 99 mg/dL 117 (H) 63 (L) 96 75  (H): Data is abnormally high (L): Data is abnormally low Diabetes history: DM Outpatient Diabetes medications:  Semglee 8 units q HS, Novolin R prn Current orders for Inpatient glycemic control:  Semglee 10 units daily Novolog 0-15 units tid with meals and HS   Inpatient Diabetes Program Recommendations:     Noted hypoglycemia yesterday of 63 mg/dL.  Consider reducing Semglee to 6 units QHS.   Thanks, Bronson Curb, MSN, RNC-OB Diabetes Coordinator [phone] (8a-5p)

## 2022-04-19 NOTE — Progress Notes (Signed)
Pt's BG 39 at 1700.  It was reported at change of shift that if pt's BG was >70 at 2100, he could go home.  Pt's BG 67 at 2006, after 8 ounces of OJ it was 55 at 2046.  Pt also given food and at 2123 it was 128.   Pt made arrangements to be picked up by caretaker [name].  When notified of pick-up at 2125, he said it's too late and he will pick pt up in the morning.  Pt reports that he has no other means of transport.  Admission director of [facility] called at 2140 with no answer; voicemail message left.  On-call provider made aware.

## 2022-04-19 NOTE — Progress Notes (Signed)
Hypoglycemic Event  CBG: 67  Treatment: 8 oz juice/soda deli sandwich and chips  Symptoms: Hungry Pale  Follow-up CBG: Time 2046  CBG Result: 55 Retake @ 21:23 Result: 128  Possible Reasons for Event: Inadequate meal intake   Comments/MD notified: Notify family and facility [facility] and monitor patient. No additional orders at this time. Pt denies any acute distress or needs at this time.     Benay Pillow

## 2022-04-19 NOTE — Progress Notes (Signed)
Hypoglycemic Event  CBG: 39  Treatment: 1 tube glucose gel   Symptoms: Pale, sweaty, sleepy   Follow-up CBG: Time:15 mins  CBG Result:65  Possible Reasons for Event: Unknown   Comments/MD notified: Dr, Jamse Arn notified     Phillip Reyes [name]

## 2022-04-19 NOTE — Progress Notes (Signed)
       CROSS COVER NOTE  NAME: Phillip Reyes MRN: [id number] DOB : 03-23-1941 ATTENDING PHYSICIAN: Oren Binet*    Date of Service   04/19/2022   HPI/Events of Note   Message received from RN reporting that Phillip Reyes caregiver Phillip Reyes has declined to pick Phillip Reyes up tonight because it is too late. RN reached out to [facility] directly and was unable to speak with anyone.  Interventions   Assessment/Plan:  Nursing staff to follow up with [facility] and Caregiver to expedite discharge       To reach the provider On-Call:   7AM- 7PM see care teams to locate the attending and reach out to them via [email or web address]. 7PM-7AM contact night-coverage If you still have difficulty reaching the appropriate provider, please page the [facility] (Director on Call) for [facility] on amion for assistance  This document was prepared using Set designer software and may include unintentional dictation errors.  Neomia Glass DNP, MBA, FNP-BC Nurse Practitioner [facility] Pager [phone]

## 2022-04-19 NOTE — Progress Notes (Signed)
Pt CBG rechecked and 67. Tele Dc'ed. Pt alert and oriented no complaints of pain of shob. At this time. Resp therapist here to provide breathing treatment. Meds given with Oj and RN will reassess CBG.

## 2022-04-19 NOTE — Progress Notes (Signed)
Hypoglycemic Event  CBG: 63 at 2052  Treatment: 8 oz juice/soda  Symptoms: None  Follow-up CBG: Time:2139 CBG Result:96  Possible Reasons for Event: Inadequate meal intake  Comments/MD notified:no    Phillip Reyes [name]

## 2022-04-19 NOTE — Progress Notes (Signed)
Just received call from caretaker, he is going to come and pick pt up tonight.

## 2022-04-19 NOTE — Evaluation (Signed)
Physical Therapy Evaluation Patient Details Name: Phillip Reyes MRN: [id number] DOB: Oct 27, 1940 Today's Date: 04/19/2022  History of Present Illness  Phillip Reyes is an 53yoM who comes to [facility] 04/15/22 after concerning labs values, insidious desaturation at rest.Pt required 5L/min on arrival. PMHx includes: Acute kidney disease, Anemia, HTN, CKD, CVA, PVD, CHF, COPD, recent R hip IM nailing (May) and Dyslipidemia. At present pt is living at [facility] under respite paid for by home owners insurance as his residence is being treated for mold.  Clinical Impression  Pt admitted with above Dx. Pt has functional limitations due to deficits below (see "PT Problem List"). Pt able to provide details on baseline functional status. Today pt able to mobilize at his typical functional level. Patient's performance this date reveals no decreased ability, independence, and tolerance in performing all basic mobility required for performance of activities of daily living. Will sign off at this time and deref mobility needs to NSG.   No data found.      Recommendations for follow up therapy are one component of a multi-disciplinary discharge planning process, led by the attending physician.  Recommendations may be updated based on patient status, additional functional criteria and insurance authorization.  Follow Up Recommendations No PT follow up      Assistance Recommended at Discharge None  Patient can return home with the following  A little help with walking and/or transfers    Equipment Recommendations None recommended by PT  Recommendations for Other Services       Functional Status Assessment Patient has not had a recent decline in their functional status     Precautions / Restrictions Precautions Precautions: Fall Restrictions Weight Bearing Restrictions: No      Mobility  Bed Mobility Overal bed mobility: Independent                  Transfers Overall transfer level:  Independent                      Ambulation/Gait Ambulation/Gait assistance: Supervision Gait Distance (Feet): 290 Feet Assistive device: Rolling walker (2 wheels) Gait Pattern/deviations: WFL(Within Functional Limits) Gait velocity: 0.43ms     General Gait Details: feels to be at baseline, no LOB  Stairs            Wheelchair Mobility    Modified Rankin (Stroke Patients Only)       Balance                                             Pertinent Vitals/Pain Pain Assessment Pain Assessment: No/denies pain    Home Living Family/patient expects to be discharged to:: Assisted living                   Additional Comments: [facility] as his residence is being treated for mold.    Prior Function                       Hand Dominance        Extremity/Trunk Assessment                Communication      Cognition Arousal/Alertness: Awake/alert Behavior During Therapy: WFL for tasks assessed/performed Overall Cognitive Status: Within Functional Limits  for tasks assessed                                          General Comments      Exercises     Assessment/Plan    PT Assessment Patient does not need any further PT services  PT Problem List Decreased activity tolerance;Decreased mobility;Cardiopulmonary status limiting activity       PT Treatment Interventions      PT Goals (Current goals can be found in the Care Plan section)  Acute Rehab PT Goals PT Goal Formulation: All assessment and education complete, DC therapy    Frequency       Co-evaluation               AM-PAC PT "6 Clicks" Mobility  Outcome Measure Help needed turning from your back to your side while in a flat bed without using bedrails?: None Help needed moving from lying on your back to sitting on the side of a flat bed without  using bedrails?: None Help needed moving to and from a bed to a chair (including a wheelchair)?: None   Help needed to walk in hospital room?: A Little Help needed climbing 3-5 steps with a railing? : A Little 6 Click Score: 18    End of Session Equipment Utilized During Treatment: Gait belt Activity Tolerance: Patient tolerated treatment well;No increased pain Patient left: in chair;with call bell/phone within reach;with chair alarm set   PT Visit Diagnosis: Other abnormalities of gait and mobility (R26.89)    Time: 5701-7793 PT Time Calculation (min) (ACUTE ONLY): 18 min   Charges:   PT Evaluation $PT Eval Low Complexity: 1 Low PT Treatments $Therapeutic Exercise: 8-22 mins       11:54 AM, 04/19/22 Etta Grandchild, PT, DPT Physical Therapist - [facility]  [phone] ([place])   Trong Gosling C 04/19/2022, 11:52 AM

## 2022-04-19 NOTE — Discharge Summary (Signed)
Phillip Reyes UYQ:[id number] DOB: [birth year] DOA: 04/15/2022  PCP: [facility]  Admit date: 04/15/2022  Discharge date: 04/19/2022  Admitted From: [facility]   disposition: [facility]   Recommendations for Outpatient Follow-up:   Follow up with nephrology/renal clinic tomorrow as previously scheduled Follow-up with PCP in 1 to 2 weeks.  Home Health: N/A Equipment/Devices: None Consultations: Nephrology Discharge Condition: Improved CODE STATUS: Full Diet Recommendation: Carb modified, renal  Diet Order             Diet Carb Modified           Diet Carb Modified Fluid consistency: Thin; Room service appropriate? Yes  Diet effective now                    Chief Complaint  Patient presents with   Shortness of Breath   Kidney Failure and Abnormal Labs     Brief history of present illness from the day of admission and additional interim summary      Phillip Reyes is a 81 y.o. male with medical history significant for DM, HTN, CKD 4 with chronic anemia, COPD, HTN, chronic systolic heart failure who was brought by EMS for evaluation of shortness of breath.  Patient was sent from [facility] where he has been for the last 2 days and the call out to EMS was initially for abnormal labs.  On arrival of EMS they found his O2 sat to be in the 70s to 80s and was placed on O2 at 5 L with improvement to 93%. Patient reports he started developing shortness of breath about a week prior and it has progressively worsened and spite of being compliant with his medication.  He has a congested cough but no fevers or chills.  He denies chest pain or palpitations.  Denies lower extremity pain. Patient received DuoNebs and Solu-Medrol and route ED course and data review: Afebrile with BP 133/51 and  pulse 62 with O2 sat 97% on 5 L.  Respirations 22.   Labs: VBG unremarkable.  troponin 9 and BNP 274.  WBC normal at 7100 and COVID and flu negative.  Hemoglobin 11.9, better than baseline of 9.4,Creatinine 4.92 up from baseline of 3 EKG, personally viewed and interpreted: Showing sinus at 63 with LBBB and no acute ischemic ST-T wave changes. Chest x-ray showing ill-defined opacities in the right midlung and both lower lung zones, atelectasis or possible atypical infection Patient was able to be weaned from 5 L O2 to 2 L with treatment in the ED which included Rocephin and azithromycin for possible atypical infection.                                                                   Hospital Course  Patient's respiratory status improved with aggressive treatment with ceftriaxone, azithromycin, Solu-Medrol and inhaled bronchodilators.  Patient had been weaned off his oxygen on the day after admission and was feeling improved.  Hospital course was complicated by worsening of his baseline advanced renal insufficiency.  Patient's baseline creatinine is 3-3.5 however it peaked to 4.5 on admission.  Patient was seen by nephrology who felt this was most likely prerenal on top of intrinsic renal disease.  He was given very gentle hydration at 50 cc an hour with improvement of his creatinine down to 3.9 which is not too different from his baseline.  Patient is discharged home with no change in his medications to follow-up with his kidney physician with whom he has an appointment tomorrow.  Patient was noted to have 220 beat runs of NSVT which were asymptomatic patient does have an ICD in place.  Echocardiogram was done with normal EF and no segmental wall motion abnormalities however was noted to have grade 1 diastolic dysfunction.   CAP  Patient discharged on on renally dosed Omnicef and azithromycin x 2 days to complete 5-day course of treatment.  COPD exacerbation Patient did well with inhaled  bronchodilators and steroids while in house Patient is discharged home on his multiple maintenance inhalers He has received 4 days of steroids   NSVT Patient had two 20 beat runs of NSVT, was asymptomatic Potassium and Magnesium were replted  Patient has ICD in place  Echocardiogram was done with normal EF and no SWMF   DM 2 Continue Glargine 7 u daily   CRF Modest increase in creatinine, improved with hydration and treatment of infection Patient to follow-up with his renal clinic tomorrow.   HTN Well-controlled on carvedilol     Discharge diagnosis     Principal Problem:   Acute respiratory failure with hypoxia (HCC) Active Problems:   CAP (community acquired pneumonia)   COPD with acute exacerbation ([place])   Acute renal failure superimposed on stage 4 chronic kidney disease (HCC)   Chronic systolic CHF (congestive heart failure) (HCC)   Cardiac defibrillator in situ   Insulin dependent type 2 diabetes mellitus, controlled ([place])   HTN (hypertension)   Anemia of chronic kidney failure, stage 4 (severe) ([place])   Pneumonia    Discharge instructions    Discharge Instructions     Call MD for:  difficulty breathing, headache or visual disturbances   Complete by: As directed    Diet Carb Modified   Complete by: As directed    And renal diet   Discharge instructions   Complete by: As directed    You will need only 2 more days of antibiotics for your pneumonia.  You are getting 2 different antibiotics, Omnicef and azithromycin.  Take 1 pill of each antibiotic tomorrow and 1 pill of each antibiotic the day after tomorrow.  Tell your kidney doctor tomorrow that your creatinine had gotten up to 4.5 but that it had decreased down to 4 with IV fluids.   Increase activity slowly   Complete by: As directed        Discharge Medications   Allergies as of 04/19/2022       Reactions   Norco [hydrocodone-acetaminophen] Shortness Of Breath   Hydrochlorothiazide Other (See  Comments)   Metformin Other (See Comments)   Terazosin Other (See Comments)        Medication List     TAKE these medications    acetaminophen 500 MG tablet Commonly known as: TYLENOL Take 2  tablets (1,000 mg total) by mouth 3 (three) times daily as needed for mild pain, moderate pain or headache.   albuterol 108 (90 Base) MCG/ACT inhaler Commonly known as: VENTOLIN HFA Inhale 2 puffs into the lungs every 6 (six) hours as needed for wheezing or shortness of breath.   amLODipine 10 MG tablet Commonly known as: NORVASC Take 10 mg by mouth daily.   aspirin EC 81 MG tablet Take 81 mg by mouth daily.   atorvastatin 80 MG tablet Commonly known as: LIPITOR Take 80 mg by mouth at bedtime.   azelastine 0.1 % nasal spray Commonly known as: ASTELIN Place 1 spray into both nostrils 2 (two) times daily as needed for rhinitis.   azithromycin 250 MG tablet Commonly known as: Zithromax Take 1 tablet (250 mg total) by mouth daily for 2 days.   budesonide-formoterol 160-4.5 MCG/ACT inhaler Commonly known as: SYMBICORT Inhale 2 puffs into the lungs 2 (two) times daily.   carvedilol 6.25 MG tablet Commonly known as: COREG Take 1 tablet (6.25 mg total) by mouth 2 (two) times daily with a meal.   cefdinir 300 MG capsule Commonly known as: OMNICEF Take 1 capsule (300 mg total) by mouth daily for 2 days.   citalopram 20 MG tablet Commonly known as: CELEXA Take 20 mg by mouth daily.   cyanocobalamin 1000 MCG tablet Commonly known as: VITAMIN B12 Take 1,000 mcg by mouth daily.   diclofenac Sodium 1 % Gel Commonly known as: VOLTAREN Apply 4 g topically 4 (four) times daily.   docusate sodium 100 MG capsule Commonly known as: COLACE Take 1 capsule (100 mg total) by mouth 2 (two) times daily.   ferrous sulfate 325 (65 FE) MG tablet Take 1 tablet (325 mg total) by mouth every other day.   furosemide 40 MG tablet Commonly known as: LASIX Take 40 mg by mouth daily.    gabapentin 300 MG capsule Commonly known as: NEURONTIN Take 300 mg by mouth at bedtime.   insulin glargine-yfgn 100 UNIT/ML injection Commonly known as: SEMGLEE Inject 0.07 mLs (7 Units total) into the skin at bedtime. What changed: how much to take   insulin regular 100 units/mL injection Commonly known as: NOVOLIN R Inject into the skin 3 (three) times daily before meals.   ipratropium-albuterol 0.5-2.5 (3) MG/3ML Soln Commonly known as: DUONEB Take 3 mLs by nebulization every 6 (six) hours as needed.   lisinopril 20 MG tablet Commonly known as: ZESTRIL Take 20 mg by mouth daily.   tamsulosin 0.4 MG Caps capsule Commonly known as: FLOMAX Take 0.8 mg by mouth at bedtime.   traZODone 50 MG tablet Commonly known as: DESYREL Take 50 mg by mouth at bedtime.          Major procedures and Radiology Reports - PLEASE review detailed and final reports thoroughly  -        US RENAL  Result Date: 04/17/2022 CLINICAL DATA:  Acute kidney injury. EXAM: RENAL / URINARY TRACT ULTRASOUND COMPLETE COMPARISON:  Renal ultrasound 06/26/2021 FINDINGS: Right Kidney: Renal measurements: 9.9 x 4.5 x 3.9 cm = volume: 91 mL. Echogenicity within normal limits. No mass or hydronephrosis visualized. Small volume perinephric fluid. Left Kidney: Renal measurements: 9.8 x 5.3 x 4.3 cm = volume: 116 mL. Echogenicity within normal limits. No mass or hydronephrosis visualized. Small volume perinephric fluid. Bladder: Appears normal for degree of bladder distention. Other: None. IMPRESSION: 1. No hydronephrosis. 2. Small volume bilateral perinephric fluid. Electronically Signed   By: Logan Bores  M.D.   On: 04/17/2022 18:13   ECHOCARDIOGRAM COMPLETE  Result Date: 04/17/2022    ECHOCARDIOGRAM REPORT   Patient Name:   Phillip Reyes Date of Exam: 04/17/2022 Medical Rec #:  [id number]     Height:       68.0 in Accession #:    [id number]    Weight:       157.0 lb Date of Birth:  01/16/1941    BSA:           1.844 m Patient Age:    63 years      BP:           107/64 mmHg Patient Gender: M             HR:           63 bpm. Exam Location:  [facility] Procedure: 2D Echo, Cardiac Doppler and Color Doppler Indications:     CHF ---acute systolic Q00.86  History:         Patient has prior history of Echocardiogram examinations, most                  recent 06/26/2021.  Sonographer:     Sherrie Sport Referring Phys:  [id number] Athena Masse Diagnosing Phys: Yolonda Kida MD  Sonographer Comments: Suboptimal parasternal window and Technically challenging study due to limited acoustic windows. IMPRESSIONS  1. Borderline apical hypo. Normal Overall LVF.  2. Left ventricular ejection fraction, by estimation, is 55 to 60%. The left ventricle has normal function. The left ventricle has no regional wall motion abnormalities. Left ventricular diastolic parameters are consistent with Grade I diastolic dysfunction (impaired relaxation).  3. Right ventricular systolic function is normal. The right ventricular size is normal.  4. The mitral valve is normal in structure. Trivial mitral valve regurgitation.  5. The aortic valve is normal in structure. Aortic valve regurgitation is not visualized. FINDINGS  Left Ventricle: Left ventricular ejection fraction, by estimation, is 55 to 60%. The left ventricle has normal function. The left ventricle has no regional wall motion abnormalities. The left ventricular internal cavity size was normal in size. There is  borderline left ventricular hypertrophy. Left ventricular diastolic parameters are consistent with Grade I diastolic dysfunction (impaired relaxation). Right Ventricle: The right ventricular size is normal. No increase in right ventricular wall thickness. Right ventricular systolic function is normal. Left Atrium: Left atrial size was normal in size. Right Atrium: Right atrial size was normal in size. Pericardium: There is no evidence of pericardial effusion. Mitral Valve: The mitral valve is  normal in structure. Trivial mitral valve regurgitation. Tricuspid Valve: The tricuspid valve is normal in structure. Tricuspid valve regurgitation is trivial. Aortic Valve: The aortic valve is normal in structure. Aortic valve regurgitation is not visualized. Aortic valve mean gradient measures 8.3 mmHg. Aortic valve peak gradient measures 13.4 mmHg. Aortic valve area, by VTI measures 1.32 cm. Pulmonic Valve: The pulmonic valve was normal in structure. Pulmonic valve regurgitation is not visualized. Aorta: The ascending aorta was not well visualized. IAS/Shunts: No atrial level shunt detected by color flow Doppler. Additional Comments: Borderline apical hypo. Normal Overall LVF.  LEFT VENTRICLE PLAX 2D LVIDd:         4.40 cm   Diastology LVIDs:         3.10 cm   LV e' medial:    6.53 cm/s LV PW:         1.20 cm   LV E/e' medial:  12.1 LV IVS:  1.00 cm   LV e' lateral:   6.64 cm/s LVOT diam:     2.00 cm   LV E/e' lateral: 11.9 LV SV:         57 LV SV Index:   31 LVOT Area:     3.14 cm  RIGHT VENTRICLE RV Basal diam:  3.10 cm RV Mid diam:    2.90 cm RV S prime:     15.60 cm/s TAPSE (M-mode): 2.2 cm LEFT ATRIUM             Index        RIGHT ATRIUM           Index LA diam:        3.10 cm 1.68 cm/m   RA Area:     11.10 cm LA Vol (A2C):   23.3 ml 12.64 ml/m  RA Volume:   28.10 ml  15.24 ml/m LA Vol (A4C):   19.9 ml 10.79 ml/m LA Biplane Vol: 21.9 ml 11.88 ml/m  AORTIC VALVE AV Area (Vmax):    1.19 cm AV Area (Vmean):   1.22 cm AV Area (VTI):     1.32 cm AV Vmax:           183.00 cm/s AV Vmean:          134.000 cm/s AV VTI:            0.432 m AV Peak Grad:      13.4 mmHg AV Mean Grad:      8.3 mmHg LVOT Vmax:         69.50 cm/s LVOT Vmean:        52.000 cm/s LVOT VTI:          0.182 m LVOT/AV VTI ratio: 0.42  AORTA Ao Root diam: 3.17 cm MITRAL VALVE                TRICUSPID VALVE MV Area (PHT): 4.04 cm     TR Peak grad:   23.8 mmHg MV Decel Time: 188 msec     TR Vmax:        244.00 cm/s MV E velocity:  79.10 cm/s MV A velocity: 111.00 cm/s  SHUNTS MV E/A ratio:  0.71         Systemic VTI:  0.18 m                             Systemic Diam: 2.00 cm Yolonda Kida MD Electronically signed by Yolonda Kida MD Signature Date/Time: 04/17/2022/4:24:26 PM    Final    DG Chest Portable 1 View  Result Date: 04/15/2022 CLINICAL DATA:  Acute hypoxia. Dyspnea. Cough for 1 week. 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 Stable heart size and mediastinal contours. Mild diffuse interstitial coarsening with slight ill-defined opacities in the right mid lung and both lower lung zones. No pleural effusion or pneumothorax. No acute osseous findings. IMPRESSION: Ill-defined opacities in the right mid lung and both lower lung zones, atelectasis or possible atypical infection. Electronically Signed   By: Keith Rake M.D.   On: 04/15/2022 23:45    Micro Results    Recent Results (from the past 240 hour(s))  Resp panel by RT-PCR (RSV, Flu A&B, Covid) Anterior Nasal Swab     Status: None   Collection Time: 04/15/22 11:29 PM   Specimen: Anterior Nasal Swab  Result Value Ref Range Status  SARS Coronavirus 2 by RT PCR NEGATIVE NEGATIVE Final    Comment: (NOTE) SARS-CoV-2 target nucleic acids are NOT DETECTED.  The SARS-CoV-2 RNA is generally detectable in upper respiratory specimens during the acute phase of infection. The lowest concentration of SARS-CoV-2 viral copies this assay can detect is 138 copies/mL. A negative result does not preclude SARS-Cov-2 infection and should not be used as the sole basis for treatment or other patient management decisions. A negative result may occur with  improper specimen collection/handling, submission of specimen other than nasopharyngeal swab, presence of viral mutation(s) within the areas targeted by this assay, and inadequate number of viral copies(<138 copies/mL). A negative result must be combined  with clinical observations, patient history, and epidemiological information. The expected result is Negative.  Fact Sheet for Patients:  EntrepreneurPulse.com.au  Fact Sheet for Healthcare Providers:  IncredibleEmployment.be  This test is no t yet approved or cleared by the [place] FDA and  has been authorized for detection and/or diagnosis of SARS-CoV-2 by FDA under an Emergency Use Authorization (EUA). This EUA will remain  in effect (meaning this test can be used) for the duration of the COVID-19 declaration under Section 564(b)(1) of the Act, 21 U.S.C.section 360bbb-3(b)(1), unless the authorization is terminated  or revoked sooner.       Influenza A by PCR NEGATIVE NEGATIVE Final   Influenza B by PCR NEGATIVE NEGATIVE Final    Comment: (NOTE) The Xpert Xpress SARS-CoV-2/FLU/RSV plus assay is intended as an aid in the diagnosis of influenza from Nasopharyngeal swab specimens and should not be used as a sole basis for treatment. Nasal washings and aspirates are unacceptable for Xpert Xpress SARS-CoV-2/FLU/RSV testing.  Fact Sheet for Patients: EntrepreneurPulse.com.au  Fact Sheet for Healthcare Providers: IncredibleEmployment.be  This test is not yet approved or cleared by the [place] FDA and has been authorized for detection and/or diagnosis of SARS-CoV-2 by FDA under an Emergency Use Authorization (EUA). This EUA will remain in effect (meaning this test can be used) for the duration of the COVID-19 declaration under Section 564(b)(1) of the Act, 21 U.S.C. section 360bbb-3(b)(1), unless the authorization is terminated or revoked.     Resp Syncytial Virus by PCR NEGATIVE NEGATIVE Final    Comment: (NOTE) Fact Sheet for Patients: EntrepreneurPulse.com.au  Fact Sheet for Healthcare Providers: IncredibleEmployment.be  This test is not yet approved  or cleared by the [place] FDA and has been authorized for detection and/or diagnosis of SARS-CoV-2 by FDA under an Emergency Use Authorization (EUA). This EUA will remain in effect (meaning this test can be used) for the duration of the COVID-19 declaration under Section 564(b)(1) of the Act, 21 U.S.C. section 360bbb-3(b)(1), unless the authorization is terminated or revoked.  Performed at [facility] [address] [place], [place] [address]   Blood Culture (routine x 2)     Status: None (Preliminary result)   Collection Time: 04/16/22 12:57 AM   Specimen: Right Antecubital; Blood  Result Value Ref Range Status   Specimen Description RIGHT ANTECUBITAL  Final   Special Requests   Final    BOTTLES DRAWN AEROBIC AND ANAEROBIC Blood Culture adequate volume   Culture   Final    NO GROWTH 3 DAYS Performed at [facility], [address] [place], [place] [address]    Report Status PENDING  Incomplete  MRSA Next Gen by PCR, Nasal     Status: None   Collection Time: 04/18/22  6:09 PM   Specimen: Nasal Mucosa; Nasal  Swab  Result Value Ref Range Status   MRSA by PCR Next Gen NOT DETECTED NOT DETECTED Final    Comment: (NOTE) The GeneXpert MRSA Assay (FDA approved for NASAL specimens only), is one component of a comprehensive MRSA colonization surveillance program. It is not intended to diagnose MRSA infection nor to guide or monitor treatment for MRSA infections. Test performance is not FDA approved in patients less than 71 years old. Performed at [facility], [address] [place], [place] [address]   Culture, blood (Routine X 2) w Reflex to ID Panel     Status: None (Preliminary result)   Collection Time: 04/19/22  1:57 AM   Specimen: BLOOD  Result Value Ref Range Status   Specimen Description BLOOD BLOOD RIGHT HAND  Final   Special Requests   Final    BOTTLES DRAWN AEROBIC AND ANAEROBIC Blood Culture adequate volume   Culture   Final     NO GROWTH < 12 HOURS Performed at [facility], [address] [place], [place] [address]    Report Status PENDING  Incomplete    Today   Subjective    Phillip Reyes feels much improved since admission.  Feels ready to go home.  Denies chest pain, shortness of breath or abdominal pain.  Feels they can take care of themselves with the resources they have at home.  Objective   Blood pressure (!) 132/58, pulse (!) 59, temperature 97.9 F (36.6 C), resp. rate 18, height '5\' 8"'$  (1.727 m), weight 77.4 kg, SpO2 100 %.   Intake/Output Summary (Last 24 hours) at 04/19/2022 1555 Last data filed at 04/19/2022 1215 Gross per 24 hour  Intake 1729.5 ml  Output 1100 ml  Net 629.5 ml    Exam General: Patient appears well and in good spirits sitting up in bed in no acute distress.  Eyes: sclera anicteric, conjuctiva mild injection bilaterally CVS: S1-S2, regular  Respiratory:  decreased air entry bilaterally secondary to decreased inspiratory effort, rales at bases  GI: NABS, soft, NT  LE: No edema.  Neuro: A/O x 3, Moving all extremities equally with normal strength, CN 3-12 intact, grossly nonfocal.  Psych: patient is logical and coherent, judgement and insight appear normal, mood and affect appropriate to situation.    Data Review   CBC w Diff:  Lab Results  Component Value Date   WBC 10.9 (H) 04/18/2022   HGB 8.9 (L) 04/18/2022   HGB 10.9 (L) 02/17/2013   HCT 27.5 (L) 04/18/2022   HCT 30.9 (L) 02/17/2013   PLT 160 04/18/2022   PLT 159 02/17/2013   LYMPHOPCT 16 04/15/2022   LYMPHOPCT 24.0 02/17/2013   MONOPCT 6 04/15/2022   MONOPCT 8.8 02/17/2013   EOSPCT 2 04/15/2022   EOSPCT 1.9 02/17/2013   BASOPCT 1 04/15/2022   BASOPCT 1.2 02/17/2013    CMP:  Lab Results  Component Value Date   NA 140 04/19/2022   NA 142 02/17/2013   K 4.5 04/19/2022   K 3.9 02/17/2013   CL 113 (H) 04/19/2022   CL 110 (H) 02/17/2013   CO2 20 (L) 04/19/2022   CO2 25 02/17/2013    BUN 75 (H) 04/19/2022   BUN 18 02/17/2013   CREATININE 3.95 (H) 04/19/2022   CREATININE 1.44 (H) 02/17/2013   PROT 7.1 04/15/2022   PROT 6.1 (L) 02/16/2013   ALBUMIN 4.0 04/15/2022   ALBUMIN 3.1 (L) 02/16/2013   BILITOT 0.6 04/15/2022   BILITOT 0.3 02/16/2013   ALKPHOS 60 04/15/2022  ALKPHOS 91 02/16/2013   AST 13 (L) 04/15/2022   AST 17 02/16/2013   ALT 11 04/15/2022   ALT 15 02/16/2013  .   Total Time in preparing paper work, data evaluation and todays exam - 35 minutes  Vashti Hey M.D on 04/19/2022 at 3:55 PM  [facility]

## 2022-04-19 NOTE — Plan of Care (Signed)
  Problem: Education: Goal: Ability to verbalize understanding of medication therapies will improve Outcome: Progressing   Problem: Activity: Goal: Capacity to carry out activities will improve Outcome: Progressing   Problem: Education: Goal: Knowledge of disease or condition will improve Outcome: Progressing   Problem: Clinical Measurements: Goal: Ability to maintain clinical measurements within normal limits will improve Outcome: Progressing   Problem: Clinical Measurements: Goal: Will remain free from infection Outcome: Progressing   Problem: Clinical Measurements: Goal: Diagnostic test results will improve Outcome: Progressing   Problem: Coping: Goal: Level of anxiety will decrease Outcome: Progressing   Problem: Skin Integrity: Goal: Risk for impaired skin integrity will decrease Outcome: Progressing

## 2022-04-19 NOTE — Progress Notes (Signed)
[facility]  ROUNDING NOTE   Subjective:   Patient seen sitting up in bed, alert and oriented States he feels well today Tolerating meals without nausea and vomiting Remains on room air No lower extremity edema  Objective:  Vital signs in last 24 hours:  Temp:  [97.5 F (36.4 C)-98.1 F (36.7 C)] 97.7 F (36.5 C) (12/13 0753) Pulse Rate:  [59-64] 60 (12/13 0753) Resp:  [19-20] 20 (12/13 0753) BP: (118-141)/(55-75) 118/55 (12/13 0753) SpO2:  [97 %-100 %] 98 % (12/13 1413) Weight:  [77.4 kg] 77.4 kg (12/13 0500)  Weight change:  Filed Weights   04/15/22 2259 04/19/22 0500  Weight: 71.2 kg 77.4 kg    Intake/Output: I/O last 3 completed shifts: In: 3137.5 [P.O.:740; I.V.:1411; IV Piggyback:986.5] Out: 650 [Urine:650]   Intake/Output this shift:  Total I/O In: 527.4 [P.O.:240; I.V.:287.4] Out: 750 [Urine:750]  Physical Exam: General: NAD  Head: Normocephalic, atraumatic. Dry oral mucosal membranes  Eyes: Anicteric  Lungs:  Clear to auscultation, normal effort, room air  Heart: Regular rate and rhythm  Abdomen:  Soft, nontender  Extremities: No peripheral edema.  Neurologic: Nonfocal, moving all four extremities  Skin: No lesions, dry  Access: None    Basic Metabolic Panel: Recent Labs  Lab 04/15/22 2316 04/16/22 0237 04/17/22 1759 04/18/22 0853 04/19/22 1129  NA 142 139  --  140 140  K 4.8 4.8  --  4.5 4.5  CL 110 110  --  112* 113*  CO2 20* 18*  --  21* 20*  GLUCOSE 160* 363*  --  83 139*  BUN 86* 88*  --  89* 75*  CREATININE 4.92* 4.83*  --  4.48* 3.95*  CALCIUM 8.7* 8.0*  --  7.7* 8.1*  MG  --   --  1.9 2.0  --      Liver Function Tests: Recent Labs  Lab 04/15/22 2316  AST 13*  ALT 11  ALKPHOS 60  BILITOT 0.6  PROT 7.1  ALBUMIN 4.0    Recent Labs  Lab 04/15/22 2316  LIPASE 39    No results for input(s): "AMMONIA" in the last 168 hours.  CBC: Recent Labs  Lab 04/15/22 2316 04/16/22 0237 04/18/22 0853  WBC 7.1  11.4* 10.9*  NEUTROABS 5.3  --   --   HGB 11.9* 10.7* 8.9*  HCT 37.7* 33.5* 27.5*  MCV 95.9 95.4 95.2  PLT 241 184 160     Cardiac Enzymes: No results for input(s): "CKTOTAL", "CKMB", "CKMBINDEX", "TROPONINI" in the last 168 hours.  BNP: Invalid input(s): "POCBNP"  CBG: Recent Labs  Lab 04/18/22 1651 04/18/22 2052 04/18/22 2139 04/19/22 0755 04/19/22 1141  GLUCAP 117* 63* 96 31 149*     Microbiology: Results for orders placed or performed during the hospital encounter of 04/15/22  Resp panel by RT-PCR (RSV, Flu A&B, Covid) Anterior Nasal Swab     Status: None   Collection Time: 04/15/22 11:29 PM   Specimen: Anterior Nasal Swab  Result Value Ref Range Status   SARS Coronavirus 2 by RT PCR NEGATIVE NEGATIVE Final    Comment: (NOTE) SARS-CoV-2 target nucleic acids are NOT DETECTED.  The SARS-CoV-2 RNA is generally detectable in upper respiratory specimens during the acute phase of infection. The lowest concentration of SARS-CoV-2 viral copies this assay can detect is 138 copies/mL. A negative result does not preclude SARS-Cov-2 infection and should not be used as the sole basis for treatment or other patient management decisions. A negative result may occur with  improper specimen collection/handling, submission of specimen other than nasopharyngeal swab, presence of viral mutation(s) within the areas targeted by this assay, and inadequate number of viral copies(<138 copies/mL). A negative result must be combined with clinical observations, patient history, and epidemiological information. The expected result is Negative.  Fact Sheet for Patients:  EntrepreneurPulse.com.au  Fact Sheet for Healthcare Providers:  IncredibleEmployment.be  This test is no t yet approved or cleared by the [place] FDA and  has been authorized for detection and/or diagnosis of SARS-CoV-2 by FDA under an Emergency Use Authorization (EUA). This  EUA will remain  in effect (meaning this test can be used) for the duration of the COVID-19 declaration under Section 564(b)(1) of the Act, 21 U.S.C.section 360bbb-3(b)(1), unless the authorization is terminated  or revoked sooner.       Influenza A by PCR NEGATIVE NEGATIVE Final   Influenza B by PCR NEGATIVE NEGATIVE Final    Comment: (NOTE) The Xpert Xpress SARS-CoV-2/FLU/RSV plus assay is intended as an aid in the diagnosis of influenza from Nasopharyngeal swab specimens and should not be used as a sole basis for treatment. Nasal washings and aspirates are unacceptable for Xpert Xpress SARS-CoV-2/FLU/RSV testing.  Fact Sheet for Patients: EntrepreneurPulse.com.au  Fact Sheet for Healthcare Providers: IncredibleEmployment.be  This test is not yet approved or cleared by the [place] FDA and has been authorized for detection and/or diagnosis of SARS-CoV-2 by FDA under an Emergency Use Authorization (EUA). This EUA will remain in effect (meaning this test can be used) for the duration of the COVID-19 declaration under Section 564(b)(1) of the Act, 21 U.S.C. section 360bbb-3(b)(1), unless the authorization is terminated or revoked.     Resp Syncytial Virus by PCR NEGATIVE NEGATIVE Final    Comment: (NOTE) Fact Sheet for Patients: EntrepreneurPulse.com.au  Fact Sheet for Healthcare Providers: IncredibleEmployment.be  This test is not yet approved or cleared by the [place] FDA and has been authorized for detection and/or diagnosis of SARS-CoV-2 by FDA under an Emergency Use Authorization (EUA). This EUA will remain in effect (meaning this test can be used) for the duration of the COVID-19 declaration under Section 564(b)(1) of the Act, 21 U.S.C. section 360bbb-3(b)(1), unless the authorization is terminated or revoked.  Performed at [facility], [address] [place], [place]  [address]   Blood Culture (routine x 2)     Status: None (Preliminary result)   Collection Time: 04/16/22 12:57 AM   Specimen: Right Antecubital; Blood  Result Value Ref Range Status   Specimen Description RIGHT ANTECUBITAL  Final   Special Requests   Final    BOTTLES DRAWN AEROBIC AND ANAEROBIC Blood Culture adequate volume   Culture   Final    NO GROWTH 3 DAYS Performed at [facility], [address], [place], [place] [address]    Report Status PENDING  Incomplete  MRSA Next Gen by PCR, Nasal     Status: None   Collection Time: 04/18/22  6:09 PM   Specimen: Nasal Mucosa; Nasal Swab  Result Value Ref Range Status   MRSA by PCR Next Gen NOT DETECTED NOT DETECTED Final    Comment: (NOTE) The GeneXpert MRSA Assay (FDA approved for NASAL specimens only), is one component of a comprehensive MRSA colonization surveillance program. It is not intended to diagnose MRSA infection nor to guide or monitor treatment for MRSA infections. Test performance is not FDA approved in patients less than 32 years old. Performed at [facility], [address],  [place], [place] [address]   Culture, blood (Routine X 2) w Reflex to ID Panel     Status: None (Preliminary result)   Collection Time: 04/19/22  1:57 AM   Specimen: BLOOD  Result Value Ref Range Status   Specimen Description BLOOD BLOOD RIGHT HAND  Final   Special Requests   Final    BOTTLES DRAWN AEROBIC AND ANAEROBIC Blood Culture adequate volume   Culture   Final    NO GROWTH < 12 HOURS Performed at [facility], [address] [place], [place] [address]    Report Status PENDING  Incomplete    Coagulation Studies: No results for input(s): "LABPROT", "INR" in the last 72 hours.   Urinalysis: No results for input(s): "COLORURINE", "LABSPEC", "PHURINE", "GLUCOSEU", "HGBUR", "BILIRUBINUR", "KETONESUR", "PROTEINUR", "UROBILINOGEN", "NITRITE", "LEUKOCYTESUR" in the last 72 hours.  Invalid input(s):  "APPERANCEUR"    Imaging: US RENAL  Result Date: 04/17/2022 CLINICAL DATA:  Acute kidney injury. EXAM: RENAL / URINARY TRACT ULTRASOUND COMPLETE COMPARISON:  Renal ultrasound 06/26/2021 FINDINGS: Right Kidney: Renal measurements: 9.9 x 4.5 x 3.9 cm = volume: 91 mL. Echogenicity within normal limits. No mass or hydronephrosis visualized. Small volume perinephric fluid. Left Kidney: Renal measurements: 9.8 x 5.3 x 4.3 cm = volume: 116 mL. Echogenicity within normal limits. No mass or hydronephrosis visualized. Small volume perinephric fluid. Bladder: Appears normal for degree of bladder distention. Other: None. IMPRESSION: 1. No hydronephrosis. 2. Small volume bilateral perinephric fluid. Electronically Signed   By: Logan Bores M.D.   On: 04/17/2022 18:13     Medications:    sodium chloride 50 mL/hr at 04/19/22 1042   azithromycin Stopped (04/19/22 1129)   cefTRIAXone (ROCEPHIN)  IV Stopped (04/19/22 1129)    arformoterol  15 mcg Nebulization BID   aspirin EC  81 mg Oral Daily   atorvastatin  80 mg Oral QHS   budesonide (PULMICORT) nebulizer solution  0.25 mg Nebulization BID   carvedilol  6.25 mg Oral BID WC   citalopram  20 mg Oral Daily   guaiFENesin  1,200 mg Oral BID   heparin injection (subcutaneous)  5,000 Units Subcutaneous Q8H   insulin aspart  0-15 Units Subcutaneous TID WC   insulin aspart  0-5 Units Subcutaneous QHS   insulin glargine-yfgn  7 Units Subcutaneous Daily   ipratropium-albuterol  3 mL Nebulization Q6H   saccharomyces boulardii  250 mg Oral BID   tamsulosin  0.8 mg Oral QHS   acetaminophen **OR** acetaminophen, albuterol, ondansetron **OR** ondansetron (ZOFRAN) IV, mouth rinse  Assessment/ Plan:  Mr. Phillip Reyes is a 81 y.o.  male with past medical history of diabetes, hypertension, anemia, COPD, and chronic systolic heart failure, who was admitted to [facility] on 04/15/2022 for Pneumonia [J18.9] Atypical pneumonia [J18.9] Acute respiratory failure with hypoxia  ([place]) [J96.01] Acute renal failure superimposed on chronic kidney disease, unspecified acute renal failure type, unspecified CKD stage ([place]) [N17.9, N18.9]   Acute kidney injury likely secondary to hypovolemia, prolonged nausea and vomiting.  Baseline appears to 3 with GFR 20 on 11/01/21.  Will order renal ultrasound to rule out obstruction.  No IV contrast exposure.  Patient states he does follow with nephrology at [facility].   Creatinine continues to slowly improve, adequate urine output recorded on day shift today.  Patient cleared to discharge today from renal stance with very close follow-up with [facility] physician.  Lab Results  Component Value Date   CREATININE 3.95 (H) 04/19/2022   CREATININE 4.48 (H) 04/18/2022   CREATININE  4.83 (H) 04/16/2022    Intake/Output Summary (Last 24 hours) at 04/19/2022 1450 Last data filed at 04/19/2022 1215 Gross per 24 hour  Intake 1729.5 ml  Output 1100 ml  Net 629.5 ml    2. Anemia of chronic kidney disease Lab Results  Component Value Date   HGB 8.9 (L) 04/18/2022  Hemoglobin below target but stable.  Will continue to monitor for now  3.  Hypertension with chronic kidney disease.  Home regimen includes amlodipine, furosemide, lisinopril, and carvedilol.  Only receiving carvedilol at this time.  Blood pressure 118/55 this morning.   4. 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 Home regimen includes glargine. Most recent hemoglobin A1c is 6.0 on 04/16/22.     LOS: 3 Jamina Macbeth 12/13/20232:50 PM

## 2022-04-21 LAB — CULTURE, BLOOD (ROUTINE X 2)
Culture: NO GROWTH
Special Requests: ADEQUATE

## 2022-04-24 LAB — CULTURE, BLOOD (ROUTINE X 2)
Culture: NO GROWTH
Special Requests: ADEQUATE

## 2022-04-24 NOTE — Progress Notes (Deleted)
   Patient ID: Phillip Reyes, male    DOB: 02-02-1941, 81 y.o.   MRN: [id number]  HPI  Mr Phillip Reyes is a 81 y/o male with a history of  Echo report from 04/17/22 showed an EF of 55-60% along with trivial MR.   Admitted 04/15/22 due to SOB. Placed on oxygen. Treated with antibiotics, solu-medrol and inhalers. Nephrology consult obtained. Discharged after 4 days.   He presents today for his initial visit with a chief complaint of   Review of Systems    Physical Exam    Assessment & Plan:  1: Chronic heart failure with preserved ejection fraction without structural changes- - NYHA class - BNP 04/15/22 was 274.2  2: HTN with CKD- - BP - saw PCP @ the [facility] 04/13/22 - BMP 04/19/22 reviewed and showed sodium 140, potassium 4.5, creatinine 3.95 & GFR 15  3: DM- - A1c 04/16/22 was 6.0%  4: COPD-

## 2022-04-25 ENCOUNTER — Encounter: Payer: No Typology Code available for payment source | Admitting: Family

## 2022-04-25 ENCOUNTER — Telehealth: Payer: Self-pay | Admitting: Family

## 2022-04-25 NOTE — Telephone Encounter (Signed)
Patient did not show for his initial [facility] Failure [facility] appointment on 04/25/22. Will attempt to reschedule.

## 2022-08-24 ENCOUNTER — Ambulatory Visit
Admission: EM | Admit: 2022-08-24 | Discharge: 2022-08-24 | Disposition: A | Discharge status: Home / Self Care | Payer: No Typology Code available for payment source | Attending: Emergency Medicine | Admitting: Emergency Medicine

## 2022-08-24 DIAGNOSIS — M79674 Pain in right toe(s): Secondary | ICD-10-CM

## 2022-08-24 MED ORDER — CEPHALEXIN 500 MG PO CAPS: 500.0000 mg | ORAL_CAPSULE | Freq: Two times a day (BID) | ORAL | 0 refills | Status: AC

## 2022-08-24 MED ORDER — LIDOCAINE 5 % EX OINT: 1.0000 | TOPICAL_OINTMENT | CUTANEOUS | 0 refills | Status: AC | PRN

## 2022-08-24 MED ORDER — DICLOFENAC SODIUM 1 % EX GEL: 4.0000 g | Freq: Four times a day (QID) | CUTANEOUS | 1 refills | Status: DC

## 2022-08-24 NOTE — ED Provider Notes (Signed)
[facility]    CSN: [id number] Arrival date & time: 08/24/22  1728      History   Chief Complaint Chief Complaint  Patient presents with   Toe Pain    RT big toe     HPI Phillip Reyes is a 82 y.o. male.   Patient presents for evaluation of right great toe pain beginning 7 days ago.  Possible injury as he endorses he opened a wooden door and believes it brushed against the toe possibly leaving a splinter.  Believes he possibly also has ingrown toenail.  Has numbness at baseline, has not worsened.  Incomplete range of motion.  Difficulty bearing weight at baseline.  Denies drainage, fevers.    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   Paroxysmal supraventricular tachycardia 10/28/2021   Frequent falls 10/27/2021   Unable to care for self 10/27/2021   History of fracture of right hip 09/2021 10/27/2021   Fever 09/20/2021   Acute on chronic anemia 09/18/2021   Delirium 09/17/2021   Dysphagia 09/16/2021   Other fracture of right femur, initial encounter for closed fracture 09/14/2021   Chronic systolic CHF (congestive heart failure) 09/14/2021   CKD (chronic kidney disease) stage 4, GFR 15-29 ml/min 09/14/2021   Hyperkalemia 09/14/2021   Enterococcus UTI 06/27/2021   Aspiration pneumonia 06/27/2021   AKI (acute kidney injury) 06/27/2021   Acute renal failure superimposed on stage 4 chronic kidney disease    Malnutrition of moderate degree 06/24/2021   Monocytosis    Acute respiratory failure 06/22/2021   COPD with acute exacerbation  04/16/2020   CAP (community acquired pneumonia) 01/27/2019   Repeated falls 10/17/2018   Altered mental status 03/09/2016   Acute encephalopathy 06/11/2015   Insulin dependent type 2 diabetes mellitus, controlled 06/11/2015   HTN (hypertension) 06/11/2015   COPD (chronic obstructive pulmonary disease) 06/11/2015   H/O: stroke 06/11/2015    Past Surgical History:  Procedure Laterality Date   APPENDECTOMY     INTRAMEDULLARY (IM) NAIL INTERTROCHANTERIC Right 09/15/2021   Procedure: INTRAMEDULLARY (IM) NAIL INTERTROCHANTRIC;  Surgeon: Kennedy Bucker, MD;  Location: [facility] ORS;  Service: Orthopedics;  Laterality: Right;   THROAT SURGERY         Home Medications    Prior to Admission medications   Medication Sig Start Date End Date Taking? Authorizing Provider  acetaminophen (TYLENOL) 500 MG tablet Take 2 tablets (1,000 mg total) by mouth 3 (three) times daily as needed for mild pain, moderate pain or headache. 09/19/21   Evon Slack, PA-C  albuterol (VENTOLIN HFA) 108 (90 Base) MCG/ACT inhaler Inhale 2 puffs into the lungs every 6 (six) hours as needed for wheezing or shortness of breath.    [provider]  amLODipine (NORVASC) 10 MG tablet Take 10 mg by mouth daily.    [provider]  aspirin EC 81 MG tablet Take 81 mg by mouth daily.    [provider]  atorvastatin (LIPITOR) 80 MG tablet Take 80 mg by mouth at bedtime.  [provider]  azelastine (ASTELIN) 0.1 % nasal spray Place 1 spray into both nostrils 2 (two) times daily as needed for rhinitis.    [provider]  budesonide-formoterol (SYMBICORT) 160-4.5 MCG/ACT inhaler Inhale 2 puffs into the lungs 2 (two) times daily. Patient not taking: Reported on 04/16/2022    [provider]  carvedilol (COREG) 6.25 MG tablet Take 1 tablet (6.25 mg total) by mouth 2 (two) times daily with a meal. 09/23/21   Lanae Boast, MD  citalopram (CELEXA) 20 MG tablet Take 20 mg by mouth daily.     [provider]  cyanocobalamin (VITAMIN B12) 1000 MCG tablet Take 1,000 mcg by mouth daily.    [provider]  diclofenac Sodium (VOLTAREN) 1 % GEL Apply 4 g topically 4 (four) times daily.    [provider]  docusate sodium (COLACE) 100 MG capsule Take 1 capsule (100 mg total) by mouth 2 (two) times daily. 09/23/21   Lanae Boast, MD  ferrous sulfate 325 (65 FE) MG tablet Take 1 tablet (325 mg total) by mouth every other day. Patient not taking: Reported on 04/16/2022 11/03/21   Kathrynn Running, MD  furosemide (LASIX) 40 MG tablet Take 40 mg by mouth daily.    [provider]  gabapentin (NEURONTIN) 300 MG capsule Take 300 mg by mouth at bedtime.    [provider]  insulin glargine-yfgn (SEMGLEE) 100 UNIT/ML injection Inject 0.07 mLs (7 Units total) into the skin at bedtime. Patient taking differently: Inject 8 Units into the skin at bedtime. 11/01/21   Wouk, Wilfred Curtis, MD  insulin regular (NOVOLIN R) 100 units/mL injection Inject into the skin 3 (three) times daily before meals.    [provider]  ipratropium-albuterol (DUONEB) 0.5-2.5 (3) MG/3ML SOLN Take 3 mLs by nebulization every 6 (six) hours as needed.    [provider]  lisinopril (ZESTRIL) 20 MG tablet Take 20 mg by mouth daily.    [provider]  tamsulosin (FLOMAX) 0.4 MG CAPS capsule Take 0.8 mg by mouth at bedtime.    [provider]  traZODone (DESYREL) 50 MG tablet Take 50 mg by mouth at bedtime.    [provider]    Family History Family History  Problem Relation Age of Onset   Congestive Heart Failure Father    Other Mother        unknown medical history    Social History Social History   Tobacco Use   Smoking status: Every Day    Packs/day: 0.50    Years: 61.00    Additional pack years: 0.00    Total pack years: 30.50    Types: Cigarettes   Smokeless tobacco: Never  Vaping Use   Vaping Use: Never used   Substance Use Topics   Alcohol use: No   Drug use: No     Allergies   Norco [hydrocodone-acetaminophen], Hydrochlorothiazide, Metformin, and Terazosin   Review of Systems Review of Systems   Physical Exam Triage Vital Signs ED Triage Vitals  Enc Vitals Group     BP 08/24/22 1831 (!) 120/59     Pulse Rate 08/24/22 1830 60     Resp --      Temp 08/24/22 1830 98.1 F (36.7 C)     Temp Source 08/24/22 1830 Oral     SpO2 08/24/22 1830 100 %     Weight --      Height 08/24/22 1831  (1.727 m)     Head  Circumference --      Peak Flow --      Pain Score 08/24/22 1830 10     Pain Loc --      Pain Edu? --      Excl. in GC? --    No data found.  Updated Vital Signs BP (!) 120/59   Pulse 60   Temp 98.1 F (36.7 C) (Oral)   Ht  (1.727 m)   SpO2 100%   BMI 25.95 kg/m   Visual Acuity Right Eye Distance:   Left Eye Distance:   Bilateral Distance:    Right Eye Near:   Left Eye Near:    Bilateral Near:     Physical Exam Constitutional:      Appearance: Normal appearance.  Eyes:     Extraocular Movements: Extraocular movements intact.  Pulmonary:     Effort: Pulmonary effort is normal.  Skin:    Comments: Callus skin present to the medial aspect of the right great toe, clear drainage expelled with palpation, mild to moderate swelling generalized to the foot, able to complete range of motion, sensation intact, 2+ pedal pulse, fungus present to the nailbed with overgrowth  Neurological:     Mental Status: He is alert and oriented to person, place, and time. Mental status is at baseline.      UC Treatments / Results  Labs (all labs ordered are listed, but only abnormal results are displayed) Labs Reviewed - No data to display  EKG   Radiology No results found.  Procedures Procedures (including critical care time)  Medications Ordered in UC Medications - No data to display  Initial Impression / Assessment and Plan / UC Course  I have  reviewed the triage vital signs and the nursing notes.  Pertinent labs & imaging results that were available during my care of the patient were reviewed by me and considered in my medical decision making (see chart for details).  Pain of right great toe  As able to expel drainage from the callused area of the toe will provide coverage for infection, discussed this with patient, prescribed Keflex as well as lidocaine and Voltaren gel for management of discomfort, due to significant overgrowth of the toenail along with fungus will not attempt to remove any ingrown due to history of diabetes, advise follow-up with his podiatrist for further management and reevaluation of symptoms  Final diagnoses:  None   Discharge Instructions   None    ED Prescriptions   None    PDMP not reviewed this encounter.   Valinda Hoar, [place] 08/28/22 [phone]

## 2022-08-24 NOTE — ED Triage Notes (Signed)
Pt presents to UC c/o RT big toe pain. Pt states he opened the door and thinks wood went into his RT big toe.

## 2022-08-24 NOTE — Discharge Instructions (Signed)
Today you were evaluated for your toe pain  It is possible that there is a small piece of wood on the side of the toe however as I am unable to confirm this as which cannot be seen on imaging I will not blindly get into your toe as this puts you at a greater risk for worsening infection  At the area of concern there is clear drainage that is being expelled when the area is pressed therefore you are being placed on antibiotic  Take Keflex every morning and every evening for 5 days  With time the body will naturally push out any foreign body from the skin  For pain you may use diclofenac gel and lidocaine ointment as needed for your comfort  You have concerns about a possible ingrown toenail, unfortunately I am unable to remove this, as you have diabetes and neuropathy podiatry who is the foot specialist should be the only people that are cutting on your toenails  Please schedule follow-up appointment with your podiatrist

## 2023-01-10 DIAGNOSIS — I2510 Atherosclerotic heart disease of native coronary artery without angina pectoris: Secondary | ICD-10-CM | POA: Insufficient documentation

## 2023-01-10 DIAGNOSIS — I701 Atherosclerosis of renal artery: Secondary | ICD-10-CM | POA: Insufficient documentation

## 2023-04-17 ENCOUNTER — Emergency Department
Admission: EM | Admit: 2023-04-17 | Discharge: 2023-04-17 | Disposition: A | Discharge status: Home / Self Care | Payer: No Typology Code available for payment source | Attending: Emergency Medicine | Admitting: Emergency Medicine

## 2023-04-17 ENCOUNTER — Emergency Department: Payer: No Typology Code available for payment source

## 2023-04-17 ENCOUNTER — Other Ambulatory Visit: Payer: Self-pay

## 2023-04-17 DIAGNOSIS — Z1152 Encounter for screening for COVID-19: Secondary | ICD-10-CM | POA: Insufficient documentation

## 2023-04-17 DIAGNOSIS — I509 Heart failure, unspecified: Secondary | ICD-10-CM | POA: Diagnosis not present

## 2023-04-17 DIAGNOSIS — R051 Acute cough: Secondary | ICD-10-CM | POA: Insufficient documentation

## 2023-04-17 DIAGNOSIS — E1122 Type 2 diabetes mellitus with diabetic chronic kidney disease: Secondary | ICD-10-CM | POA: Insufficient documentation

## 2023-04-17 DIAGNOSIS — J449 Chronic obstructive pulmonary disease, unspecified: Secondary | ICD-10-CM | POA: Insufficient documentation

## 2023-04-17 DIAGNOSIS — Z7982 Long term (current) use of aspirin: Secondary | ICD-10-CM | POA: Diagnosis not present

## 2023-04-17 DIAGNOSIS — R079 Chest pain, unspecified: Secondary | ICD-10-CM | POA: Insufficient documentation

## 2023-04-17 DIAGNOSIS — N189 Chronic kidney disease, unspecified: Secondary | ICD-10-CM | POA: Insufficient documentation

## 2023-04-17 DIAGNOSIS — I130 Hypertensive heart and chronic kidney disease with heart failure and stage 1 through stage 4 chronic kidney disease, or unspecified chronic kidney disease: Secondary | ICD-10-CM | POA: Insufficient documentation

## 2023-04-17 LAB — RESP PANEL BY RT-PCR (RSV, FLU A&B, COVID)  RVPGX2
Influenza A by PCR: NEGATIVE
Influenza B by PCR: NEGATIVE
Resp Syncytial Virus by PCR: NEGATIVE
SARS Coronavirus 2 by RT PCR: NEGATIVE

## 2023-04-17 LAB — COMPREHENSIVE METABOLIC PANEL
ALT: 9 U/L (ref 0–44)
AST: 15 U/L (ref 15–41)
Albumin: 3.8 g/dL (ref 3.5–5.0)
Alkaline Phosphatase: 68 U/L (ref 38–126)
Anion gap: 10 (ref 5–15)
BUN: 33 mg/dL — ABNORMAL HIGH (ref 8–23)
CO2: 21 mmol/L — ABNORMAL LOW (ref 22–32)
Calcium: 8.7 mg/dL — ABNORMAL LOW (ref 8.9–10.3)
Chloride: 107 mmol/L (ref 98–111)
Creatinine, Ser: 2.89 mg/dL — ABNORMAL HIGH (ref 0.61–1.24)
GFR, Estimated: 21 mL/min — ABNORMAL LOW (ref >60)
Glucose, Bld: 116 mg/dL — ABNORMAL HIGH (ref 70–99)
Potassium: 4 mmol/L (ref 3.5–5.1)
Sodium: 138 mmol/L (ref 135–145)
Total Bilirubin: 0.7 mg/dL (ref <1.2)
Total Protein: 6.4 g/dL — ABNORMAL LOW (ref 6.5–8.1)

## 2023-04-17 LAB — CBC
HCT: 34.7 % — ABNORMAL LOW (ref 39.0–52.0)
Hemoglobin: 11.7 g/dL — ABNORMAL LOW (ref 13.0–17.0)
MCH: 31.3 pg (ref 26.0–34.0)
MCHC: 33.7 g/dL (ref 30.0–36.0)
MCV: 92.8 fL (ref 80.0–100.0)
Platelets: 219 10*3/uL (ref 150–400)
RBC: 3.74 MIL/uL — ABNORMAL LOW (ref 4.22–5.81)
RDW: 14 % (ref 11.5–15.5)
WBC: 11.9 10*3/uL — ABNORMAL HIGH (ref 4.0–10.5)
nRBC: 0 % (ref 0.0–0.2)

## 2023-04-17 LAB — TROPONIN I (HIGH SENSITIVITY)
Troponin I (High Sensitivity): 12 ng/L (ref <18)
Troponin I (High Sensitivity): 10 ng/L (ref <18)

## 2023-04-17 MED ORDER — PREDNISONE 20 MG PO TABS: 40.0000 mg | ORAL_TABLET | Freq: Every day | ORAL | 0 refills | Status: AC

## 2023-04-17 MED ORDER — IPRATROPIUM-ALBUTEROL 0.5-2.5 (3) MG/3ML IN SOLN
3.0000 mL | Freq: Once | RESPIRATORY_TRACT | Status: AC
  Administered 2023-04-17: 3 mL via RESPIRATORY_TRACT
  Filled 2023-04-17: qty 3

## 2023-04-17 MED ORDER — AMOXICILLIN-POT CLAVULANATE 875-125 MG PO TABS: 1.0000 | ORAL_TABLET | Freq: Two times a day (BID) | ORAL | 0 refills | Status: AC

## 2023-04-17 NOTE — Discharge Instructions (Signed)
You were seen in the emergency department today for evaluation of your cough and chest pain.  Your testing fortunately did not show an emergency cause for your pain, but I suspect you may have a COPD exacerbation with your new cough.  I sent a prescription for an antibiotic and a steroid course to your pharmacy.  Please follow-up with your primary care doctor

## 2023-04-17 NOTE — ED Notes (Signed)
Patient Alert and oriented to baseline. Stable and ambulatory to baseline. Patient verbalized understanding of the discharge instructions.  Patient belongings were taken by the patient.   

## 2023-04-17 NOTE — ED Notes (Signed)
Attempted to contact caregiver for transportation home without success.

## 2023-04-17 NOTE — ED Notes (Signed)
Attempted to contact caregiver multiple times without success.

## 2023-04-17 NOTE — ED Triage Notes (Signed)
Pt arrives via [facility] from home for acute onset of CP while watching television. Pt has had a cough and reports family member with URI. Pt has productive cough with Chandrea Zellman sputum. Pain is worse with deep inspiration and cough. Rhonchi audible. Pt received 324mg  ASA at home and 1 SL nitroglycerin with EMS.

## 2023-04-17 NOTE — ED Provider Notes (Signed)
[facility] Provider Note    Event Date/Time   First MD Initiated Contact with Patient 04/17/23 0848     (approximate)   History   Cough   HPI  Phillip Reyes is a 82 year old male with history of DM, HTN, CKD, COPD, CHF presenting to the emergency department for evaluation of chest pain.  Patient reports he has had an ongoing cough for several weeks productive with green sputum.  Shortly prior to presentation he was watching television when he had acute onset of chest pain described as a pressure in the center of his chest.  Reports a history of a heart attack, but says he did not have pain at that time.  No fevers or chills.  Family member sick with URI symptoms.  Received 324 of aspirin and 1 sublingual nitroglycerin with EMS with some improvement.    Physical Exam   Triage Vital Signs: ED Triage Vitals  Encounter Vitals Group     BP 04/17/23 0854 (!) 145/84     Systolic BP Percentile --      Diastolic BP Percentile --      Pulse Rate 04/17/23 0854 61     Resp 04/17/23 0854 18     Temp 04/17/23 0854 98.6 F (37 C)     Temp Source 04/17/23 0854 Oral     SpO2 04/17/23 0854 100 %     Weight 04/17/23 0850 157 lb 11.2 oz (71.5 kg)     Height 04/17/23 0850 5\' 8"  (1.727 m)     Head Circumference --      Peak Flow --      Pain Score 04/17/23 0849 9     Pain Loc --      Pain Education --      Exclude from Growth Chart --     Most recent vital signs: Vitals:   04/17/23 1200 04/17/23 1300  BP: (!) 151/54 (!) 166/56  Pulse: 60 60  Resp: (!) 21 (!) 21  Temp:    SpO2: 100% 100%     General: Awake, interactive  CV:  Regular rate, good peripheral perfusion.  Chest wall: Reproducible tenderness palpation over the anterior chest wall Resp:  Respiration not significantly labored, slightly diminished lung sounds, but fair air movement without significant wheezing Abd:  Nondistended, soft Neuro:  Symmetric facial movement, fluid speech   ED Results  / Procedures / Treatments   Labs (all labs ordered are listed, but only abnormal results are displayed) Labs Reviewed  CBC - Abnormal; Notable for the following components:      Result Value   WBC 11.9 (*)    RBC 3.74 (*)    Hemoglobin 11.7 (*)    HCT 34.7 (*)    All other components within normal limits  COMPREHENSIVE METABOLIC PANEL - Abnormal; Notable for the following components:   CO2 21 (*)    Glucose, Bld 116 (*)    BUN 33 (*)    Creatinine, Ser 2.89 (*)    Calcium 8.7 (*)    Total Protein 6.4 (*)    GFR, Estimated 21 (*)    All other components within normal limits  RESP PANEL BY RT-PCR (RSV, FLU A&B, COVID)  RVPGX2  TROPONIN I (HIGH SENSITIVITY)  TROPONIN I (HIGH SENSITIVITY)     EKG EKG independently reviewed interpreted by myself (ER attending) demonstrates:  EKG demonstrates sinus rhythm versus junctional rhythm at a rate of 60, QRS 118, QTc 467, no acute ST changes  RADIOLOGY Imaging independently reviewed and interpreted by myself demonstrates:  CXR without focal consolidation  PROCEDURES:  Critical Care performed: No  Procedures   MEDICATIONS ORDERED IN ED: Medications  ipratropium-albuterol (DUONEB) 0.5-2.5 (3) MG/3ML nebulizer solution 3 mL (3 mLs Nebulization Given 04/17/23 0929)     IMPRESSION / MDM / ASSESSMENT AND PLAN / ED COURSE  I reviewed the triage vital signs and the nursing notes.  Differential diagnosis includes, but is not limited to, pneumonia, COPD exacerbation, viral illness, ACS, pneumothorax  Patient's presentation is most consistent with acute presentation with potential threat to life or bodily function.  82 year old male presenting to the emergency department for evaluation of chest pain in the setting of ongoing cough.  Labs with mild leukocytosis, stable anemia and renal dysfunction.  Troponin x 2 negative.  EKG without acute ischemic findings.  Viral swab negative.  X-Marshay Slates without pneumonia.  Did trial DuoNeb after which  patient reported some improvement.  Not significantly wheezy on exam here, patient is comfortable with discharge home with outpatient follow-up.  With sputum change, will DC with prescription for Augmentin and prednisone.  Will also place referral to cardiology in the setting of chest pain.  Strict return precautions provided.  Patient discharged stable condition.      FINAL CLINICAL IMPRESSION(S) / ED DIAGNOSES   Final diagnoses:  Acute cough  Nonspecific chest pain     Rx / DC Orders   ED Discharge Orders          Ordered    Ambulatory referral to Cardiology       Comments: If you have not heard from the Cardiology office within the next 72 hours please call [phone].   04/17/23 1322    amoxicillin-clavulanate (AUGMENTIN) 875-125 MG tablet  2 times daily        04/17/23 1336    predniSONE (DELTASONE) 20 MG tablet  Daily with breakfast        04/17/23 1336             Note:  This document was prepared using Dragon voice recognition software and may include unintentional dictation errors.   Trinna Post, MD 04/17/23 [phone]

## 2023-04-17 NOTE — ED Notes (Signed)
Attempted to call caregiver listed in chart ([name]) and spouse Britta Mccreedy) to arrange transportation home. No answer from either family member

## 2023-07-19 ENCOUNTER — Encounter: Payer: Self-pay | Admitting: Ophthalmology

## 2023-07-19 NOTE — Anesthesia Preprocedure Evaluation (Addendum)
 Anesthesia Evaluation  Patient identified by MRN, date of birth, ID band Patient awake    Reviewed: Allergy & Precautions, H&P , NPO status , Patient's Chart, lab work & pertinent test results  Airway Mallampati: IV  TM Distance: >3 FB Neck ROM: Full    Dental no notable dental hx. (+) Upper Dentures, Lower Dentures   Pulmonary neg pulmonary ROS, pneumonia, COPD, former smoker   Pulmonary exam normal breath sounds clear to auscultation       Cardiovascular hypertension, +CHF  negative cardio ROS Normal cardiovascular exam+ pacemaker + Cardiac Defibrillator  Rhythm:Regular Rate:Normal  Office note  07-02-23 Dr. Judie Petit cCroryj  02CAD/CHF/s/p STEMI - s/p PCI (08/06/18 and 08/08/18). s/p ICD implantation on  12/24/19. Cards f/u in Dec - ASA 81 mg PO daily for life - Continue carvedilol 6.25 mg bid and Lisinopril 20 mg daily  - Continue Furosemide at 40 mg daily PRN.  - ECHO 02/24/19 showed LVEF improved to 45%, mild global hypokinesia, apical  akinesia, and Grade 2 diastolic dysfunction      Neuro/Psych  PSYCHIATRIC DISORDERS      CVA negative neurological ROS  negative psych ROS   GI/Hepatic negative GI ROS, Neg liver ROS,,,  Endo/Other  negative endocrine ROSdiabetes    Renal/GU Renal diseasenegative Renal ROS  negative genitourinary   Musculoskeletal negative musculoskeletal ROS (+) Arthritis ,    Abdominal   Peds negative pediatric ROS (+)  Hematology negative hematology ROS (+) Blood dyscrasia, anemia   Anesthesia Other Findings Diabetes mellitus without complication  Cancer (HCC) COPD (chronic obstructive pulmonary disease) (HCC)  Hypertension Renal disorder  Stroke (HCC) Arthritis  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  Grade II diastolic dysfunction     Reproductive/Obstetrics negative OB ROS                              Anesthesia Physical Anesthesia Plan  ASA: 4  Anesthesia Plan: MAC   Post-op Pain Management:    Induction: Intravenous  PONV Risk Score and Plan:   Airway Management Planned: Natural Airway and Nasal Cannula  Additional Equipment:   Intra-op Plan:   Post-operative Plan:   Informed Consent: I have reviewed the patients History and Physical, chart, labs and discussed the procedure including the risks, benefits and alternatives for the proposed anesthesia with the patient or authorized representative who has indicated his/her understanding and acceptance.     Dental Advisory Given  Plan Discussed with: Anesthesiologist, CRNA and Surgeon  Anesthesia Plan Comments: (Patient consented for risks of anesthesia including but not limited to:  - adverse reactions to medications - damage to eyes, teeth, lips or other oral mucosa - nerve damage due to positioning  - sore throat or hoarseness - Damage to heart, brain, nerves, lungs, other parts of body or loss of life  Patient voiced understanding and assent.)        Anesthesia Quick Evaluation

## 2023-07-25 NOTE — Discharge Instructions (Signed)

## 2023-07-26 ENCOUNTER — Encounter
Admission: RE | Disposition: A | Discharge status: Home / Self Care | Payer: Self-pay | Source: Home / Self Care | Attending: Ophthalmology

## 2023-07-26 SURGERY — PHACOEMULSIFICATION, CATARACT, WITH IOL INSERTION
Anesthesia: Topical | Laterality: Right

## 2023-07-26 NOTE — H&P (Signed)
 [facility]   Primary Care Physician:  [facility] Ophthalmologist: Dr. Deberah Pelton  Pre-Procedure History & Physical: HPI:  Phillip Reyes is a 83 y.o. male here for cataract surgery.   Past Medical History:  Diagnosis Date   AICD (automatic cardioverter/defibrillator) present    Arthritis    Cancer (HCC)    COPD (chronic obstructive pulmonary disease) (HCC)    Diabetes mellitus without complication (HCC)    Grade I diastolic dysfunction    Hypertension    Presence of permanent cardiac pacemaker    Renal disorder    CKD -4   Stroke (HCC) 2019   No residual deficits    Past Surgical History:  Procedure Laterality Date   APPENDECTOMY     INTRAMEDULLARY (IM) NAIL INTERTROCHANTERIC Right 09/15/2021   Procedure: INTRAMEDULLARY (IM) NAIL INTERTROCHANTRIC;  Surgeon: Kennedy Bucker, MD;  Location: [facility] ORS;  Service: Orthopedics;  Laterality: Right;   THROAT SURGERY      Prior to Admission medications   Medication Sig Start Date End Date Taking? Authorizing Provider  acetaminophen (TYLENOL) 500 MG tablet Take 2 tablets (1,000 mg total) by mouth 3 (three) times daily as needed for mild pain, moderate pain or headache. 09/19/21  Yes Evon Slack, PA-C  albuterol (VENTOLIN HFA) 108 (90 Base) MCG/ACT inhaler Inhale 2 puffs into the lungs every 6 (six) hours as needed for wheezing or shortness of breath.   Yes [provider]  aspirin EC 81 MG tablet Take 81 mg by mouth daily.   Yes [provider]  atorvastatin (LIPITOR) 80 MG tablet Take 40 mg by mouth at bedtime.   Yes [provider]  budesonide-formoterol (SYMBICORT) 160-4.5 MCG/ACT inhaler Inhale 2 puffs into the lungs 2 (two) times daily.   Yes [provider]  carvedilol (COREG) 6.25 MG tablet Take 1 tablet (6.25 mg total) by mouth 2 (two) times daily with a meal. 09/23/21  Yes Kc, Ramesh, MD  citalopram (CELEXA) 20 MG tablet Take 20 mg by mouth daily.   Yes [provider]  cyanocobalamin (VITAMIN B12) 1000 MCG tablet Take 1,000 mcg by mouth daily.   Yes [provider]  docusate sodium (COLACE) 100 MG capsule Take 1 capsule (100 mg total) by mouth 2 (two) times daily. Patient taking differently: Take 100 mg by mouth daily as needed. 09/23/21  Yes Lanae Boast, MD  ferrous sulfate 325 (65 FE) MG tablet Take 1 tablet (325 mg total) by mouth every other day. 11/03/21  Yes Wouk, Wilfred Curtis, MD  fluticasone [facility]) 50 MCG/ACT nasal spray Place into both nostrils daily as needed for allergies or rhinitis.   Yes [provider]  furosemide (LASIX) 40 MG tablet Take 40 mg by mouth daily.   Yes [provider]  gabapentin (NEURONTIN) 300 MG capsule Take 300 mg by mouth at bedtime.   Yes [provider]  insulin glargine (LANTUS) 100 UNIT/ML injection Inject 5 Units into the skin at bedtime.   Yes [provider]  insulin regular (NOVOLIN R) 100 units/mL injection Inject 2-5 Units into the skin 2 (two) times daily before a meal.   Yes [provider]  lisinopril (ZESTRIL) 20 MG tablet Take 20 mg by mouth daily.   Yes [provider]  omeprazole (PRILOSEC) 20 MG capsule Take 20 mg by mouth 3 (three) times daily before meals.   Yes [provider]  tamsulosin (FLOMAX) 0.4 MG CAPS capsule Take 0.8 mg by mouth at bedtime.  Yes [provider]  traZODone (DESYREL) 50 MG tablet Take 50 mg by mouth at bedtime.   Yes [provider]  lidocaine (XYLOCAINE) 5 % ointment Apply 1 Application topically as needed. Patient not taking: Reported on 07/19/2023 08/24/22   Valinda Hoar, NP    Allergies as of 07/03/2023 - Review Complete 04/17/2023  Allergen Reaction Noted   Norco [hydrocodone-acetaminophen] Shortness Of Breath 06/13/2015   Hydrochlorothiazide Other (See Comments) 04/15/2022   Metformin Other (See Comments) 04/15/2022   Terazosin Other (See Comments) 04/15/2022     Family History  Problem Relation Age of Onset   Congestive Heart Failure Father    Other Mother        unknown medical history    Social History   Socioeconomic History   Marital status: Married    Spouse name: Not on file   Number of children: Not on file   Years of education: Not on file   Highest education level: Not on file  Occupational History   Not on file  Tobacco Use   Smoking status: Former    Current packs/day: 0.00    Average packs/day: 0.5 packs/day for 61.0 years (30.5 ttl pk-yrs)    Types: Cigarettes    Quit date: 05/08/2022    Years since quitting: 1.2   Smokeless tobacco: Never  Vaping Use   Vaping status: Never Used  Substance and Sexual Activity   Alcohol use: No   Drug use: No   Sexual activity: Not on file  Other Topics Concern   Not on file  Social History Narrative   Not on file   Social Drivers of Health   Financial Resource Strain: Not on file  Food Insecurity: No Food Insecurity (04/18/2022)   Hunger Vital Sign    Worried About Running Out of Food in the Last Year: Never true    Ran Out of Food in the Last Year: Never true  Transportation Needs: No Transportation Needs (04/18/2022)   PRAPARE - Administrator, Civil Service (Medical): No    Lack of Transportation (Non-Medical): No  Physical Activity: Not on file  Stress: Not on file  Social Connections: Not on file  Intimate Partner Violence: Not At Risk (04/18/2022)   Humiliation, Afraid, Rape, and Kick questionnaire    Fear of Current or Ex-Partner: No    Emotionally Abused: No    Physically Abused: No    Sexually Abused: No    Review of Systems: See HPI, otherwise negative ROS  Physical Exam: Ht 5\' 8"  (1.727 m)   Wt 65.3 kg   BMI 21.90 kg/m  General:   Alert, cooperative in NAD Head:  Normocephalic and atraumatic. Respiratory:  Normal work of breathing. Cardiovascular:  RRR  Impression/Plan: Phillip Reyes is here for cataract surgery.  Risks,  benefits, limitations, and alternatives regarding cataract surgery have been reviewed with the patient.  Questions have been answered.  All parties agreeable.   Estanislado Pandy, MD  07/26/2023, 7:23 AM

## 2023-08-02 ENCOUNTER — Encounter
Admission: RE | Disposition: A | Discharge status: Home / Self Care | Payer: Self-pay | Source: Home / Self Care | Attending: Ophthalmology

## 2023-08-02 ENCOUNTER — Ambulatory Visit
Admission: RE | Admit: 2023-08-02 | Discharge: 2023-08-02 | Disposition: A | Discharge status: Home / Self Care | Payer: No Typology Code available for payment source | Attending: Ophthalmology | Admitting: Ophthalmology

## 2023-08-02 ENCOUNTER — Other Ambulatory Visit: Payer: Self-pay

## 2023-08-02 ENCOUNTER — Ambulatory Visit: Payer: Self-pay | Admitting: Anesthesiology

## 2023-08-02 ENCOUNTER — Encounter: Payer: Self-pay | Admitting: Ophthalmology

## 2023-08-02 DIAGNOSIS — I130 Hypertensive heart and chronic kidney disease with heart failure and stage 1 through stage 4 chronic kidney disease, or unspecified chronic kidney disease: Secondary | ICD-10-CM | POA: Diagnosis not present

## 2023-08-02 DIAGNOSIS — E1122 Type 2 diabetes mellitus with diabetic chronic kidney disease: Secondary | ICD-10-CM | POA: Insufficient documentation

## 2023-08-02 DIAGNOSIS — E1136 Type 2 diabetes mellitus with diabetic cataract: Secondary | ICD-10-CM | POA: Diagnosis present

## 2023-08-02 DIAGNOSIS — Z95810 Presence of automatic (implantable) cardiac defibrillator: Secondary | ICD-10-CM | POA: Diagnosis not present

## 2023-08-02 DIAGNOSIS — H2512 Age-related nuclear cataract, left eye: Secondary | ICD-10-CM | POA: Diagnosis not present

## 2023-08-02 DIAGNOSIS — Z8249 Family history of ischemic heart disease and other diseases of the circulatory system: Secondary | ICD-10-CM | POA: Diagnosis not present

## 2023-08-02 DIAGNOSIS — D631 Anemia in chronic kidney disease: Secondary | ICD-10-CM | POA: Diagnosis not present

## 2023-08-02 DIAGNOSIS — Z87891 Personal history of nicotine dependence: Secondary | ICD-10-CM | POA: Insufficient documentation

## 2023-08-02 DIAGNOSIS — I509 Heart failure, unspecified: Secondary | ICD-10-CM | POA: Diagnosis not present

## 2023-08-02 DIAGNOSIS — Z8673 Personal history of transient ischemic attack (TIA), and cerebral infarction without residual deficits: Secondary | ICD-10-CM | POA: Diagnosis not present

## 2023-08-02 DIAGNOSIS — M1990 Unspecified osteoarthritis, unspecified site: Secondary | ICD-10-CM | POA: Diagnosis not present

## 2023-08-02 DIAGNOSIS — N189 Chronic kidney disease, unspecified: Secondary | ICD-10-CM | POA: Diagnosis not present

## 2023-08-02 DIAGNOSIS — J449 Chronic obstructive pulmonary disease, unspecified: Secondary | ICD-10-CM | POA: Diagnosis not present

## 2023-08-02 DIAGNOSIS — I252 Old myocardial infarction: Secondary | ICD-10-CM | POA: Diagnosis not present

## 2023-08-02 HISTORY — DX: Other ill-defined heart diseases: I51.89

## 2023-08-02 HISTORY — DX: Complete loss of teeth, unspecified cause, unspecified class: K08.109

## 2023-08-02 HISTORY — DX: Presence of cardiac pacemaker: Z95.0

## 2023-08-02 HISTORY — DX: Presence of automatic (implantable) cardiac defibrillator: Z95.810

## 2023-08-02 LAB — GLUCOSE, CAPILLARY: Glucose-Capillary: 112 mg/dL — ABNORMAL HIGH (ref 70–99)

## 2023-08-02 SURGERY — PHACOEMULSIFICATION, CATARACT, WITH IOL INSERTION
Anesthesia: Monitor Anesthesia Care | Laterality: Left

## 2023-08-02 MED ORDER — ARMC OPHTHALMIC DILATING DROPS
OPHTHALMIC | Status: AC
  Filled 2023-08-02: qty 0.5

## 2023-08-02 MED ORDER — ARMC OPHTHALMIC DILATING DROPS
1.0000 | OPHTHALMIC | Status: DC | PRN
  Administered 2023-08-02 (×3): 1 via OPHTHALMIC

## 2023-08-02 MED ORDER — SIGHTPATH DOSE#1 BSS IO SOLN
INTRAOCULAR | Status: DC | PRN
  Administered 2023-08-02 (×2): 15 mL via INTRAOCULAR

## 2023-08-02 MED ORDER — PHENYLEPHRINE-KETOROLAC 1-0.3 % IO SOLN
INTRAOCULAR | Status: DC | PRN
  Administered 2023-08-02: 99 mL via OPHTHALMIC

## 2023-08-02 MED ORDER — LIDOCAINE HCL (PF) 2 % IJ SOLN
INTRAOCULAR | Status: DC | PRN
  Administered 2023-08-02: 1 mL via INTRAOCULAR

## 2023-08-02 MED ORDER — MOXIFLOXACIN HCL 0.5 % OP SOLN
OPHTHALMIC | Status: DC | PRN
  Administered 2023-08-02: .2 mL via OPHTHALMIC

## 2023-08-02 MED ORDER — TETRACAINE HCL 0.5 % OP SOLN
OPHTHALMIC | Status: AC
  Filled 2023-08-02: qty 4

## 2023-08-02 MED ORDER — MIDAZOLAM HCL 2 MG/2ML IJ SOLN
INTRAMUSCULAR | Status: DC | PRN
  Administered 2023-08-02: 1 mg via INTRAVENOUS

## 2023-08-02 MED ORDER — FENTANYL CITRATE (PF) 100 MCG/2ML IJ SOLN
INTRAMUSCULAR | Status: AC
  Filled 2023-08-02: qty 2

## 2023-08-02 MED ORDER — TETRACAINE HCL 0.5 % OP SOLN
1.0000 [drp] | OPHTHALMIC | Status: DC | PRN
  Administered 2023-08-02 (×3): 1 [drp] via OPHTHALMIC

## 2023-08-02 MED ORDER — SIGHTPATH DOSE#1 NA HYALUR & NA CHOND-NA HYALUR IO KIT
PACK | INTRAOCULAR | Status: DC | PRN
  Administered 2023-08-02: 1 via OPHTHALMIC

## 2023-08-02 MED ORDER — BRIMONIDINE TARTRATE-TIMOLOL 0.2-0.5 % OP SOLN
OPHTHALMIC | Status: DC | PRN
  Administered 2023-08-02: 1 [drp] via OPHTHALMIC

## 2023-08-02 MED ORDER — FENTANYL CITRATE (PF) 100 MCG/2ML IJ SOLN
INTRAMUSCULAR | Status: DC | PRN
Start: 2023-08-02 — End: 2023-08-02
  Administered 2023-08-02 (×2): 25 ug via INTRAVENOUS

## 2023-08-02 MED ORDER — MIDAZOLAM HCL 2 MG/2ML IJ SOLN
INTRAMUSCULAR | Status: AC
  Filled 2023-08-02: qty 2

## 2023-08-02 SURGICAL SUPPLY — 14 items
CATARACT SUITE SIGHTPATH (MISCELLANEOUS) ×1 IMPLANT
DISSECTOR HYDRO NUCLEUS 50X22 (MISCELLANEOUS) ×1 IMPLANT
DRSG TEGADERM 2-3/8X2-3/4 SM (GAUZE/BANDAGES/DRESSINGS) ×1 IMPLANT
FEE CATARACT SUITE SIGHTPATH (MISCELLANEOUS) ×1 IMPLANT
GLOVE BIOGEL PI IND STRL 8 (GLOVE) ×1 IMPLANT
GLOVE SURG LX STRL 7.5 STRW (GLOVE) ×1 IMPLANT
GLOVE SURG PROTEXIS BL SZ6.5 (GLOVE) ×1 IMPLANT
GLOVE SURG SYN 6.5 PF PI BL (GLOVE) ×1 IMPLANT
LENS CLAREON WAGON WHEEL 21.0 (Intraocular Lens) ×1 IMPLANT
LENS IOL CLRN WGN WHL 21.0 (Intraocular Lens) IMPLANT
NDL FILTER BLUNT 18X1 1/2 (NEEDLE) ×1 IMPLANT
NEEDLE FILTER BLUNT 18X1 1/2 (NEEDLE) ×1 IMPLANT
RING MALYGIN (MISCELLANEOUS) IMPLANT
SYR 3ML LL SCALE MARK (SYRINGE) ×1 IMPLANT

## 2023-08-02 NOTE — Anesthesia Postprocedure Evaluation (Signed)
 Anesthesia Post Note  Patient: Phillip Reyes  Procedure(s) Performed: CATARACT EXTRACTION PHACO AND INTRAOCULAR LENS PLACEMENT (IOC) LEFT DIABETIC MALYUGIN VISION BLUE OMIDRIA 20.02, 01:44.7 (Left)  Patient location during evaluation: PACU Anesthesia Type: MAC Level of consciousness: awake and alert Pain management: pain level controlled Vital Signs Assessment: post-procedure vital signs reviewed and stable Respiratory status: spontaneous breathing, nonlabored ventilation, respiratory function stable and patient connected to nasal cannula oxygen Cardiovascular status: stable and blood pressure returned to baseline Postop Assessment: no apparent nausea or vomiting Anesthetic complications: no   No notable events documented.   Last Vitals:  Vitals:   08/02/23 1130 08/02/23 1136  BP: (!) 127/59 (!) 139/56  Pulse: 60   Resp: 20   Temp: (!) 36.1 C 36.7 C  SpO2: 97%     Last Pain:  Vitals:   08/02/23 1136  TempSrc:   PainSc: 0-No pain                 Norely Schlick C Antanisha Mohs

## 2023-08-02 NOTE — H&P (Signed)
 [facility]   Primary Care Physician:  [facility] Ophthalmologist: Dr. Deberah Pelton  Pre-Procedure History & Physical: HPI:  Phillip Reyes is a 83 y.o. male here for cataract surgery.   Past Medical History:  Diagnosis Date   AICD (automatic cardioverter/defibrillator) present    Arthritis    Cancer (HCC)    COPD (chronic obstructive pulmonary disease) (HCC)    Diabetes mellitus without complication (HCC)    Grade I diastolic dysfunction    Hypertension    Presence of permanent cardiac pacemaker    Renal disorder    CKD -4   Stroke (HCC) 2019   No residual deficits    Past Surgical History:  Procedure Laterality Date   APPENDECTOMY     INTRAMEDULLARY (IM) NAIL INTERTROCHANTERIC Right 09/15/2021   Procedure: INTRAMEDULLARY (IM) NAIL INTERTROCHANTRIC;  Surgeon: Kennedy Bucker, MD;  Location: [facility] ORS;  Service: Orthopedics;  Laterality: Right;   THROAT SURGERY      Prior to Admission medications   Medication Sig Start Date End Date Taking? Authorizing Provider  acetaminophen (TYLENOL) 500 MG tablet Take 2 tablets (1,000 mg total) by mouth 3 (three) times daily as needed for mild pain, moderate pain or headache. 09/19/21  Yes Evon Slack, PA-C  albuterol (VENTOLIN HFA) 108 (90 Base) MCG/ACT inhaler Inhale 2 puffs into the lungs every 6 (six) hours as needed for wheezing or shortness of breath.   Yes [provider]  aspirin EC 81 MG tablet Take 81 mg by mouth daily.   Yes [provider]  atorvastatin (LIPITOR) 80 MG tablet Take 40 mg by mouth at bedtime.   Yes [provider]  budesonide-formoterol (SYMBICORT) 160-4.5 MCG/ACT inhaler Inhale 2 puffs into the lungs 2 (two) times daily.   Yes [provider]  carvedilol (COREG) 6.25 MG tablet Take 1 tablet (6.25 mg total) by mouth 2 (two) times daily with a meal. 09/23/21  Yes Kc, Ramesh, MD  citalopram (CELEXA) 20 MG tablet Take 20 mg by mouth daily.   Yes [provider]  cyanocobalamin (VITAMIN B12) 1000 MCG tablet Take 1,000 mcg by mouth daily.   Yes [provider]  docusate sodium (COLACE) 100 MG capsule Take 1 capsule (100 mg total) by mouth 2 (two) times daily. Patient taking differently: Take 100 mg by mouth daily as needed. 09/23/21  Yes Lanae Boast, MD  ferrous sulfate 325 (65 FE) MG tablet Take 1 tablet (325 mg total) by mouth every other day. 11/03/21  Yes Wouk, Wilfred Curtis, MD  fluticasone [facility]) 50 MCG/ACT nasal spray Place into both nostrils daily as needed for allergies or rhinitis.   Yes [provider]  furosemide (LASIX) 40 MG tablet Take 40 mg by mouth daily.   Yes [provider]  gabapentin (NEURONTIN) 300 MG capsule Take 300 mg by mouth at bedtime.   Yes [provider]  insulin glargine (LANTUS) 100 UNIT/ML injection Inject 5 Units into the skin at bedtime.   Yes [provider]  insulin regular (NOVOLIN R) 100 units/mL injection Inject 2-5 Units into the skin 2 (two) times daily before a meal.   Yes [provider]  lisinopril (ZESTRIL) 20 MG tablet Take 20 mg by mouth daily.   Yes [provider]  omeprazole (PRILOSEC) 20 MG capsule Take 20 mg by mouth 3 (three) times daily before meals.   Yes [provider]  tamsulosin (FLOMAX) 0.4 MG CAPS capsule Take 0.8 mg by mouth at bedtime.  Yes [provider]  traZODone (DESYREL) 50 MG tablet Take 50 mg by mouth at bedtime.   Yes [provider]  lidocaine (XYLOCAINE) 5 % ointment Apply 1 Application topically as needed. Patient not taking: Reported on 07/19/2023 08/24/22   Valinda Hoar, NP    Allergies as of 07/03/2023 - Review Complete 04/17/2023  Allergen Reaction Noted   Norco [hydrocodone-acetaminophen] Shortness Of Breath 06/13/2015   Hydrochlorothiazide Other (See Comments) 04/15/2022   Metformin Other (See Comments) 04/15/2022   Terazosin Other (See Comments) 04/15/2022     Family History  Problem Relation Age of Onset   Congestive Heart Failure Father    Other Mother        unknown medical history    Social History   Socioeconomic History   Marital status: Married    Spouse name: Not on file   Number of children: Not on file   Years of education: Not on file   Highest education level: Not on file  Occupational History   Not on file  Tobacco Use   Smoking status: Former    Current packs/day: 0.00    Average packs/day: 0.5 packs/day for 61.0 years (30.5 ttl pk-yrs)    Types: Cigarettes    Quit date: 05/08/2022    Years since quitting: 1.2   Smokeless tobacco: Never  Vaping Use   Vaping status: Never Used  Substance and Sexual Activity   Alcohol use: No   Drug use: No   Sexual activity: Not on file  Other Topics Concern   Not on file  Social History Narrative   Not on file   Social Drivers of Health   Financial Resource Strain: Not on file  Food Insecurity: No Food Insecurity (04/18/2022)   Hunger Vital Sign    Worried About Running Out of Food in the Last Year: Never true    Ran Out of Food in the Last Year: Never true  Transportation Needs: No Transportation Needs (04/18/2022)   PRAPARE - Administrator, Civil Service (Medical): No    Lack of Transportation (Non-Medical): No  Physical Activity: Not on file  Stress: Not on file  Social Connections: Not on file  Intimate Partner Violence: Not At Risk (04/18/2022)   Humiliation, Afraid, Rape, and Kick questionnaire    Fear of Current or Ex-Partner: No    Emotionally Abused: No    Physically Abused: No    Sexually Abused: No    Review of Systems: See HPI, otherwise negative ROS  Physical Exam: Ht 5\' 8"  (1.727 m)   Wt 65.3 kg   BMI 21.90 kg/m  General:   Alert, cooperative in NAD Head:  Normocephalic and atraumatic. Respiratory:  Normal work of breathing. Cardiovascular:  RRR  Impression/Plan: [name] is here for cataract surgery.  Risks,  benefits, limitations, and alternatives regarding cataract surgery have been reviewed with the patient.  Questions have been answered.  All parties agreeable.   Estanislado Pandy, MD  08/02/2023, 7:06 AM

## 2023-08-02 NOTE — Transfer of Care (Signed)
 Immediate Anesthesia Transfer of Care Note  Patient: Phillip Reyes  Procedure(s) Performed: CATARACT EXTRACTION PHACO AND INTRAOCULAR LENS PLACEMENT (IOC) LEFT DIABETIC MALYUGIN VISION BLUE OMIDRIA 20.02, 01:44.7 (Left)  Patient Location: PACU  Anesthesia Type: MAC  Level of Consciousness: awake, alert  and patient cooperative  Airway and Oxygen Therapy: Patient Spontanous Breathing and Patient connected to supplemental oxygen  Post-op Assessment: Post-op Vital signs reviewed, Patient's Cardiovascular Status Stable, Respiratory Function Stable, Patent Airway and No signs of Nausea or vomiting  Post-op Vital Signs: Reviewed and stable  Complications: No notable events documented.

## 2023-08-02 NOTE — Op Note (Signed)
 OPERATIVE NOTE  Jeet Shough [id number] 08/02/2023   PREOPERATIVE DIAGNOSIS: Nuclear sclerotic cataract left eye. H25.12   POSTOPERATIVE DIAGNOSIS: Nuclear sclerotic cataract left eye. H25.12   PROCEDURE:  Phacoemusification with posterior chamber intraocular lens placement of the left eye  Ultrasound time: Procedure(s): CATARACT EXTRACTION PHACO AND INTRAOCULAR LENS PLACEMENT (IOC) LEFT DIABETIC MALYUGIN VISION BLUE OMIDRIA 20.02, 01:44.7 (Left)  LENS:   Implant Name Type Inv. Item Serial No. Manufacturer Lot No. LRB No. Used Action  LENS CL[employer] WAGON WHEEL 21.0 - [id number] Intraocular Lens LENS [employer] WAGON WHEEL 21.0 [id number] [employer]  Left 1 Implanted      SURGEON:  Julious Payer. Rolley Sims, MD   ANESTHESIA:  Topical with tetracaine drops, augmented with 1% preservative-free intracameral lidocaine.   COMPLICATIONS:  None.   DESCRIPTION OF PROCEDURE:  The patient was identified in the holding room and transported to the operating room and placed in the supine position under the operating microscope.  The left eye was identified as the operative eye, which was prepped and draped in the usual sterile ophthalmic fashion.   A 1 millimeter clear-corneal paracentesis was made inferotemporally. Preservative-free 1% lidocaine mixed with 1:1,000 bisulfite-free aqueous solution of epinephrine was injected into the anterior chamber. The anterior chamber was then filled with Viscoat viscoelastic. A 2.4 millimeter keratome was used to make a clear-corneal incision superotemporally. The pupil remained miotic so a 6.25 mm Malyugin ring was inserted and engaged the pupil border. A curvilinear capsulorrhexis was made with a cystotome and capsulorrhexis forceps. Balanced salt solution was used to hydrodissect and hydrodelineate the nucleus. Phacoemulsification was then used to remove the lens nucleus and epinucleus. The remaining cortex was then removed using the irrigation and aspiration handpiece.  Provisc was then placed into the capsular bag to distend it for lens placement. A +21.00 D [id number] intraocular lens was then injected into the capsular bag. The Malyugin ring was removed. The remaining viscoelastic was aspirated.   Wounds were hydrated with balanced salt solution.  The anterior chamber was inflated to a physiologic pressure with balanced salt solution.  No wound leaks were noted. Moxifloxacin was injected intracmerally. Timolol and Brimonidine drops were applied to the eye.  The patient was taken to the recovery room in stable condition without complications of anesthesia or surgery.  [employer] 08/02/2023, 11:28 AM

## 2023-08-03 ENCOUNTER — Encounter: Payer: Self-pay | Admitting: Ophthalmology

## 2023-10-16 ENCOUNTER — Encounter: Payer: Self-pay | Admitting: Anesthesiology

## 2023-10-16 ENCOUNTER — Encounter: Payer: Self-pay | Admitting: Ophthalmology

## 2023-10-29 ENCOUNTER — Other Ambulatory Visit: Payer: Self-pay

## 2023-10-29 ENCOUNTER — Emergency Department
Admission: EM | Admit: 2023-10-29 | Discharge: 2023-10-29 | Disposition: A | Discharge status: Home / Self Care | Attending: Emergency Medicine | Admitting: Emergency Medicine

## 2023-10-29 ENCOUNTER — Encounter: Payer: Self-pay | Admitting: Intensive Care

## 2023-10-29 ENCOUNTER — Emergency Department

## 2023-10-29 DIAGNOSIS — R0602 Shortness of breath: Secondary | ICD-10-CM | POA: Diagnosis present

## 2023-10-29 DIAGNOSIS — R531 Weakness: Secondary | ICD-10-CM | POA: Diagnosis not present

## 2023-10-29 DIAGNOSIS — J441 Chronic obstructive pulmonary disease with (acute) exacerbation: Secondary | ICD-10-CM | POA: Diagnosis not present

## 2023-10-29 DIAGNOSIS — D72829 Elevated white blood cell count, unspecified: Secondary | ICD-10-CM | POA: Insufficient documentation

## 2023-10-29 LAB — CBC
HCT: 31.9 % — ABNORMAL LOW (ref 39.0–52.0)
Hemoglobin: 10.7 g/dL — ABNORMAL LOW (ref 13.0–17.0)
MCH: 31.8 pg (ref 26.0–34.0)
MCHC: 33.5 g/dL (ref 30.0–36.0)
MCV: 94.9 fL (ref 80.0–100.0)
Platelets: 231 10*3/uL (ref 150–400)
RBC: 3.36 MIL/uL — ABNORMAL LOW (ref 4.22–5.81)
RDW: 14.3 % (ref 11.5–15.5)
WBC: 10.7 10*3/uL — ABNORMAL HIGH (ref 4.0–10.5)
nRBC: 0 % (ref 0.0–0.2)

## 2023-10-29 LAB — BASIC METABOLIC PANEL WITH GFR
Anion gap: 10 (ref 5–15)
BUN: 41 mg/dL — ABNORMAL HIGH (ref 8–23)
CO2: 23 mmol/L (ref 22–32)
Calcium: 8.7 mg/dL — ABNORMAL LOW (ref 8.9–10.3)
Chloride: 105 mmol/L (ref 98–111)
Creatinine, Ser: 3.8 mg/dL — ABNORMAL HIGH (ref 0.61–1.24)
GFR, Estimated: 15 mL/min — ABNORMAL LOW (ref >60)
Glucose, Bld: 130 mg/dL — ABNORMAL HIGH (ref 70–99)
Potassium: 4.8 mmol/L (ref 3.5–5.1)
Sodium: 138 mmol/L (ref 135–145)

## 2023-10-29 LAB — BRAIN NATRIURETIC PEPTIDE: B Natriuretic Peptide: 893.9 pg/mL — ABNORMAL HIGH (ref 0.0–100.0)

## 2023-10-29 MED ORDER — PREDNISONE 10 MG (21) PO TBPK: ORAL_TABLET | ORAL | 0 refills | Status: DC

## 2023-10-29 MED ORDER — AZITHROMYCIN 250 MG PO TABS: ORAL_TABLET | ORAL | 0 refills | Status: AC

## 2023-10-29 MED ORDER — AZITHROMYCIN 500 MG PO TABS
500.0000 mg | ORAL_TABLET | Freq: Once | ORAL | Status: AC
  Administered 2023-10-29: 500 mg via ORAL
  Filled 2023-10-29: qty 1

## 2023-10-29 MED ORDER — METHYLPREDNISOLONE SODIUM SUCC 125 MG IJ SOLR
125.0000 mg | Freq: Once | INTRAMUSCULAR | Status: AC
  Administered 2023-10-29: 125 mg via INTRAVENOUS
  Filled 2023-10-29: qty 2

## 2023-10-29 MED ORDER — IPRATROPIUM-ALBUTEROL 0.5-2.5 (3) MG/3ML IN SOLN
3.0000 mL | Freq: Once | RESPIRATORY_TRACT | Status: AC
  Administered 2023-10-29: 3 mL via RESPIRATORY_TRACT
  Filled 2023-10-29: qty 3

## 2023-10-29 MED ORDER — PREDNISONE 10 MG (21) PO TBPK
ORAL_TABLET | ORAL | 0 refills | Status: AC
Start: 2023-10-29 — End: ?

## 2023-10-29 MED ORDER — AZITHROMYCIN 250 MG PO TABS: ORAL_TABLET | ORAL | 0 refills | Status: DC

## 2023-10-29 NOTE — ED Provider Notes (Signed)
 [facility] Provider Note   Event Date/Time   First MD Initiated Contact with Patient 10/29/23 1406     (approximate) History  No chief complaint on file.  HPI Phillip Reyes is a 83 y.o. male with a stated past medical history of COPD of worsening shortness of breath, generalized weakness, multiple falls over the last few days.  Patient's caregiver at bedside and states that he has had difficulty with exertion and complaining about worsening weakness.  Denies any recent travel, sick contacts, or food out of the ordinary.  Denies any joint pain, head trauma, or loss of consciousness with any falls ROS: Patient currently denies any vision changes, tinnitus, difficulty speaking, facial droop, sore throat, chest pain, abdominal pain, nausea/vomiting/diarrhea, dysuria, or weakness/numbness/paresthesias in any extremity   Physical Exam  Triage Vital Signs: ED Triage Vitals  Encounter Vitals Group     BP 10/29/23 1147 (!) 167/65     Girls Systolic BP Percentile --      Girls Diastolic BP Percentile --      Boys Systolic BP Percentile --      Boys Diastolic BP Percentile --      Pulse Rate 10/29/23 1147 (!) 59     Resp 10/29/23 1147 20     Temp 10/29/23 1147 98.2 F (36.8 C)     Temp Source 10/29/23 1147 Oral     SpO2 10/29/23 1147 100 %     Weight 10/29/23 1149 143 lb (64.9 kg)     Height 10/29/23 1149 5' 8.5 (1.74 m)     Head Circumference --      Peak Flow --      Pain Score 10/29/23 1149 10     Pain Loc --      Pain Education --      Exclude from Growth Chart --    Most recent vital signs: Vitals:   10/29/23 1147  BP: (!) 167/65  Pulse: (!) 59  Resp: 20  Temp: 98.2 F (36.8 C)  SpO2: 100%   General: Awake, oriented x4. CV:  Good peripheral perfusion. Resp:  Increased effort.  Inspiratory and expiratory wheezing over bilateral lung fields Abd:  No distention. Other:  Elderly well-developed, well-nourished Caucasian male resting comfortably in  mild respiratory distress ED Results / Procedures / Treatments  Labs (all labs ordered are listed, but only abnormal results are displayed) Labs Reviewed  BASIC METABOLIC PANEL WITH GFR - Abnormal; Notable for the following components:      Result Value   Glucose, Bld 130 (*)    BUN 41 (*)    Creatinine, Ser 3.80 (*)    Calcium  8.7 (*)    GFR, Estimated 15 (*)    All other components within normal limits  CBC - Abnormal; Notable for the following components:   WBC 10.7 (*)    RBC 3.36 (*)    Hemoglobin 10.7 (*)    HCT 31.9 (*)    All other components within normal limits  BRAIN NATRIURETIC PEPTIDE - Abnormal; Notable for the following components:   B Natriuretic Peptide 893.9 (*)    All other components within normal limits   EKG ED ECG REPORT I, Artist MARLA Kerns, the attending physician, personally viewed and interpreted this ECG. Date: 10/29/2023 EKG Time: 1152 Rate: 60 Rhythm: Atrial paced rhythm QRS Axis: normal Intervals: normal ST/T Wave abnormalities: normal Narrative Interpretation: Atrial paced rhythm.  No evidence of acute ischemia RADIOLOGY ED MD interpretation: 2 view chest x-ray  interpreted by me shows no evidence of acute abnormalities including no pneumonia, pneumothorax, or widened mediastinum - All radiology independently interpreted and agree with radiology assessment Official radiology report(s): DG Chest 2 View Result Date: 10/29/2023 CLINICAL DATA:  Shortness of breath.  Cold symptoms. EXAM: CHEST - 2 VIEW COMPARISON:  04/17/2023 and CT chest 06/11/2015. FINDINGS: Trachea is midline. Heart size stable. Pacemaker lead tips project over the right atrium and right ventricle. Lungs are somewhat low in volume. No airspace consolidation or pleural fluid. Flowing anterior osteophytosis in the thoracic spine. There may be slight compression of T12. IMPRESSION: Low lung volumes.  No acute findings. Electronically Signed   By: Newell Eke M.D.   On: 10/29/2023  14:08   PROCEDURES: Critical Care performed: No Procedures MEDICATIONS ORDERED IN ED: Medications  ipratropium-albuterol  (DUONEB) 0.5-2.5 (3) MG/3ML nebulizer solution 3 mL (3 mLs Nebulization Given 10/29/23 1445)  methylPREDNISolone  sodium succinate (SOLU-MEDROL ) 125 mg/2 mL injection 125 mg (125 mg Intravenous Given 10/29/23 1445)  azithromycin  (ZITHROMAX ) tablet 500 mg (500 mg Oral Given 10/29/23 1445)   IMPRESSION / MDM / ASSESSMENT AND PLAN / ED COURSE  I reviewed the triage vital signs and the nursing notes.                             The patient is on the cardiac monitor to evaluate for evidence of arrhythmia and/or significant heart rate changes. Patient's presentation is most consistent with acute presentation with potential threat to life or bodily function. The patient appears to be suffering from a moderate exacerbation of COPD.  Based on the history, exam, CXR/EKG, and further workup I don't suspect any other emergent cause of this presentation, such as pneumonia, acute coronary syndrome, congestive heart failure, pulmonary embolism, or pneumothorax.  ED Interventions: bronchodilators, steroids, antibiotics, reassess  Reassessment: After treatment, the patient's shortness of breath is resolved, and their lung exam has returned to baseline. They are comfortable and want to go home.  Rx: Steroids, Antibiotics Disposition: Discharge home with SRP. PCP follow up recommended in next 48hours.   FINAL CLINICAL IMPRESSION(S) / ED DIAGNOSES   Final diagnoses:  COPD with acute exacerbation (HCC)  Generalized weakness   Rx / DC Orders   ED Discharge Orders          Ordered    predniSONE  (STERAPRED UNI-PAK 21 TAB) 10 MG (21) TBPK tablet  Status:  Discontinued        10/29/23 1444    azithromycin  (ZITHROMAX  Z-PAK) 250 MG tablet  Status:  Discontinued        10/29/23 1444    predniSONE  (STERAPRED UNI-PAK 21 TAB) 10 MG (21) TBPK tablet        10/29/23 1523    azithromycin   (ZITHROMAX  Z-PAK) 250 MG tablet        10/29/23 1523           Note:  This document was prepared using Dragon voice recognition software and may include unintentional dictation errors.   Shonta Bourque K, MD 10/30/23 [phone]

## 2023-10-29 NOTE — ED Triage Notes (Signed)
 Caregiver reports he has had a cold X1 week. Reports new SOB. Had fall Saturday and today. Reports he felt dizzy and that's why he has been falling. Denies LOC.   C/o headache, bilateral hip pain, neck pain and tailbone pain  Baseline uses walker and cane  History COPD and diabetes. Has had right sided hip surgery in past  A&O x4 in triage

## 2023-11-08 ENCOUNTER — Ambulatory Visit: Admission: RE | Admit: 2023-11-08 | Source: Home / Self Care | Admitting: Ophthalmology

## 2023-11-08 SURGERY — PHACOEMULSIFICATION, CATARACT, WITH IOL INSERTION
Anesthesia: Topical | Laterality: Right

## 2024-04-17 IMAGING — DX DG CHEST 1V PORT
1 series · 1 of 1 positions shown · non-contrast
Comparison: Portable exam 1117 hours compared to 09/14/2021

CLINICAL DATA: Fever

EXAM:
PORTABLE CHEST 1 VIEW

[chest ap]
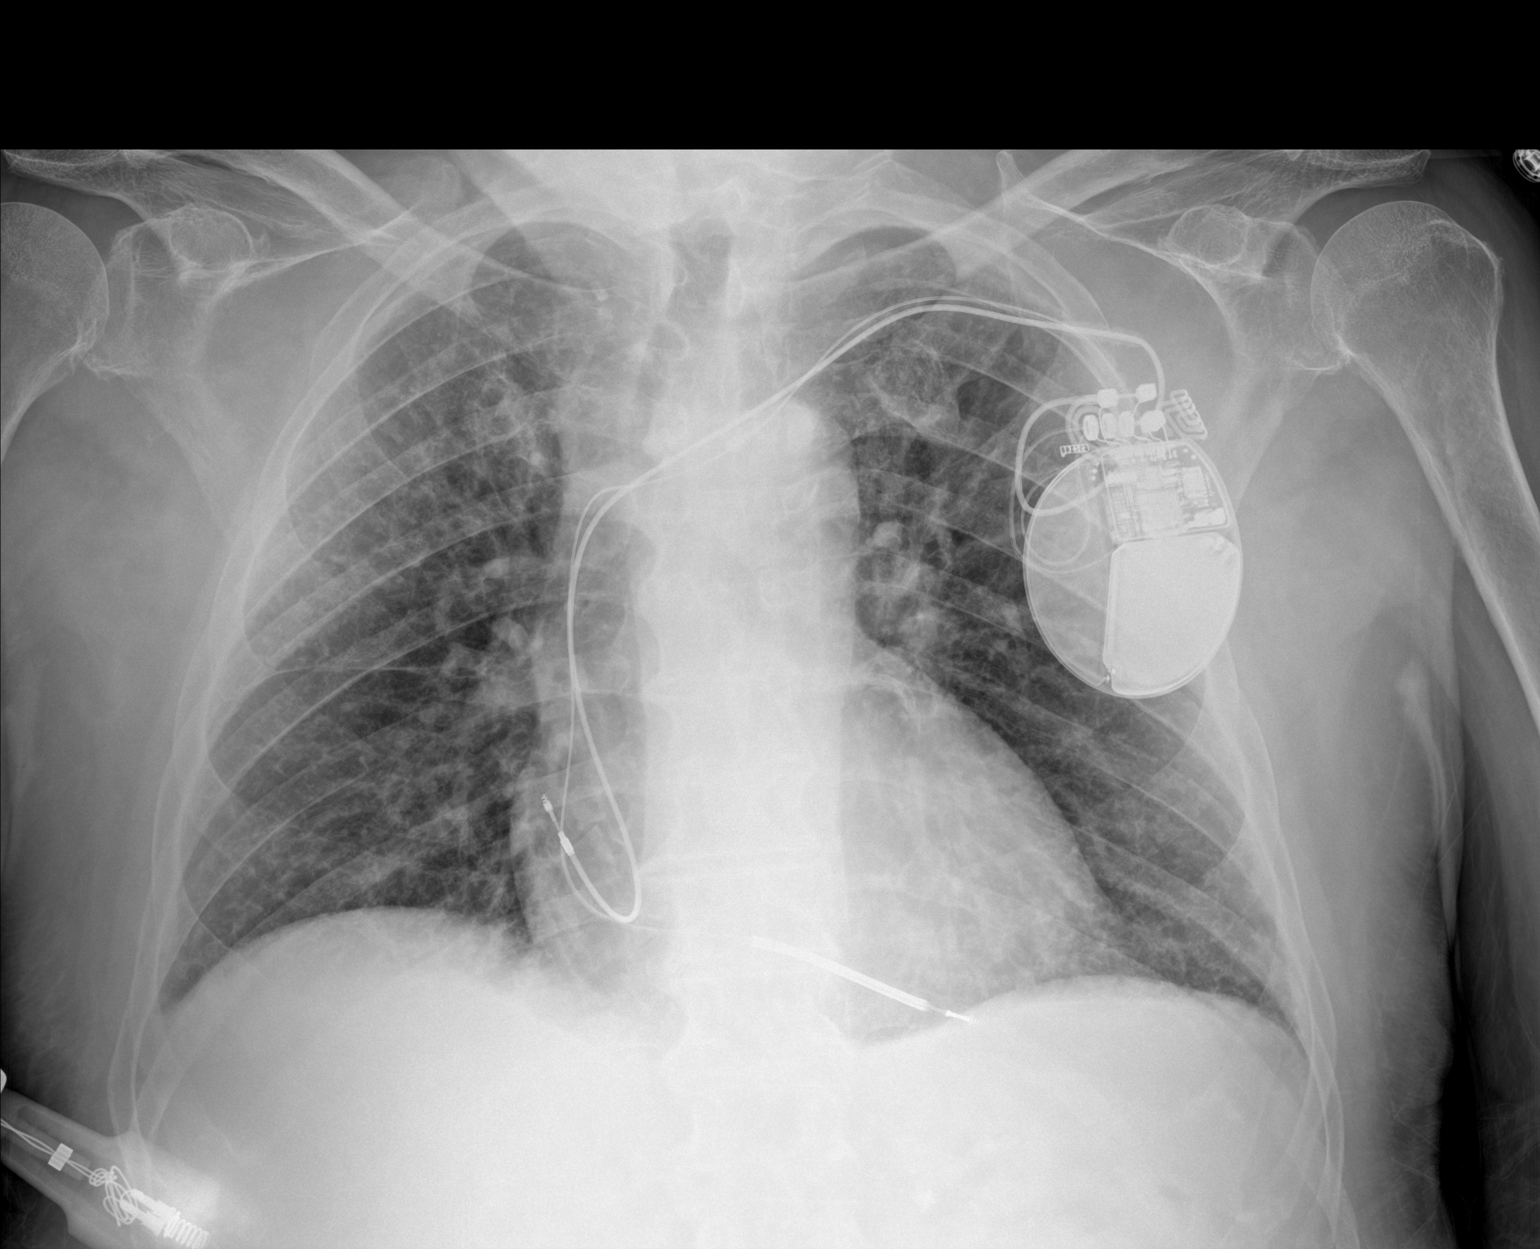

[1 of 1 positions shown; findings below may reference images not displayed]

FINDINGS: LEFT subclavian ICD leads stable projecting over RIGHT atrium and
RIGHT ventricle.

Normal heart size, mediastinal contours, and pulmonary vascularity.

Atherosclerotic calcification aorta.

RIGHT upper lobe infiltrate question pneumonia.

Slight accentuation of interstitial markings in remaining lungs
though less pronounced than RIGHT upper lobe.

No pleural effusion or pneumothorax.

Bones demineralized.
IMPRESSION: Question mild RIGHT upper lobe infiltrate/pneumonia.

## 2024-05-24 IMAGING — CT CT HEAD W/O CM
4 series · 16 of 47 positions shown, 18 images · non-contrast
Comparison: 10/23/2021

CLINICAL DATA: Neck trauma, fall 4 days ago



[Series 2: head bone · axial · 0.41mm/px · z∈[+350,+380]mm · 3 of 75 slices shown]
[im 8/75  bone]
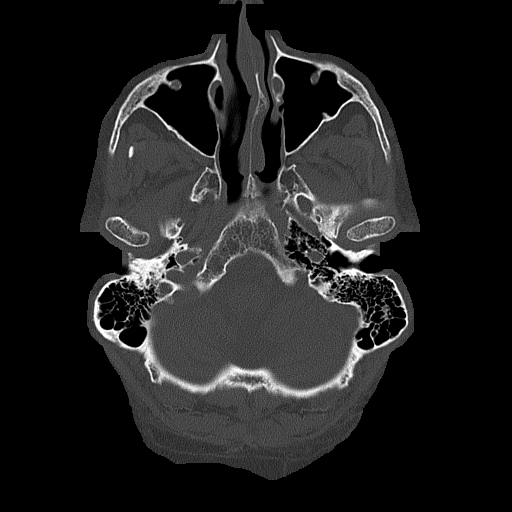
[im 15/75  bone]
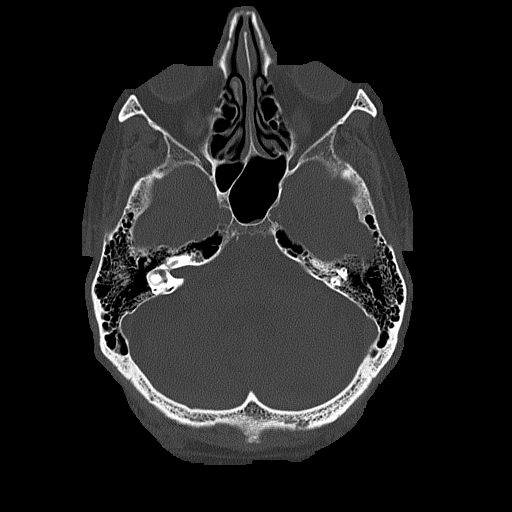
[im 23/75  bone]
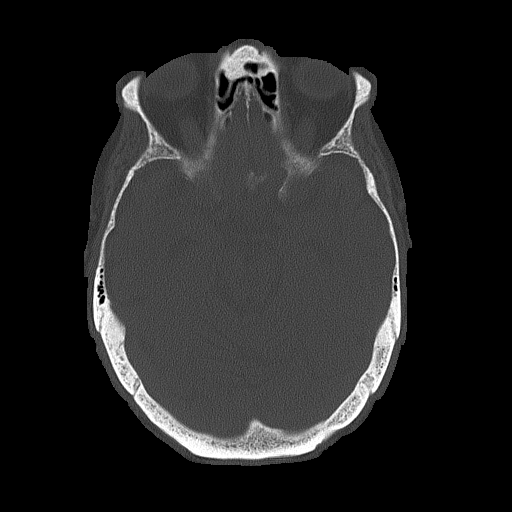

[Series 3: head wo · axial · 0.41mm/px · z∈[+351,+461]mm · 7 of 30 slices shown, 9 images]
[im 4/30  brain]
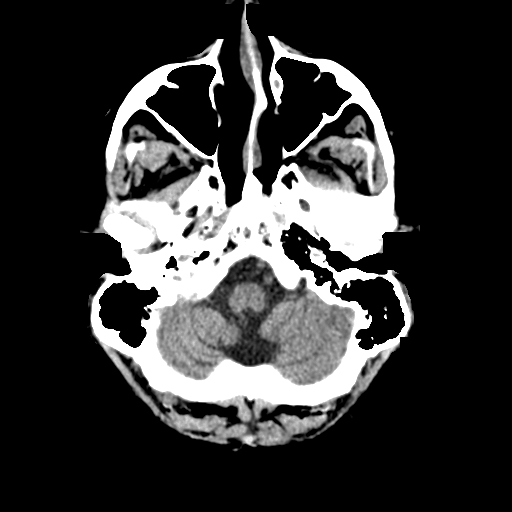
[im 4/30  bone]
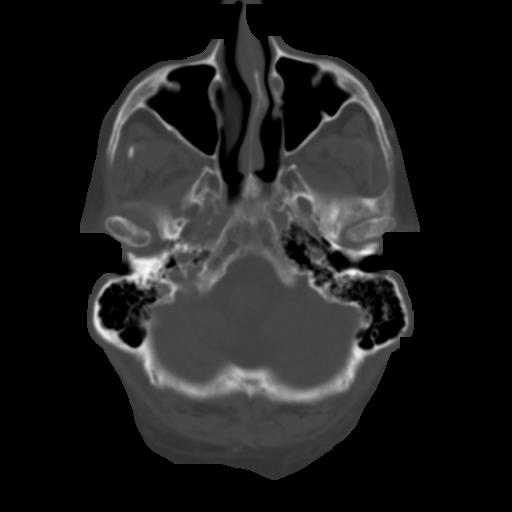
[im 8/30  brain]
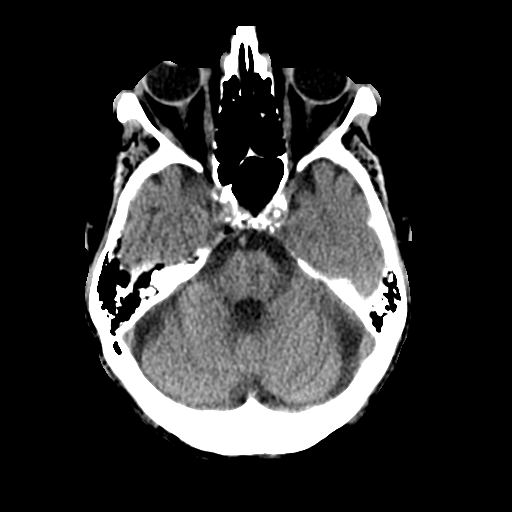
[im 11/30  brain]
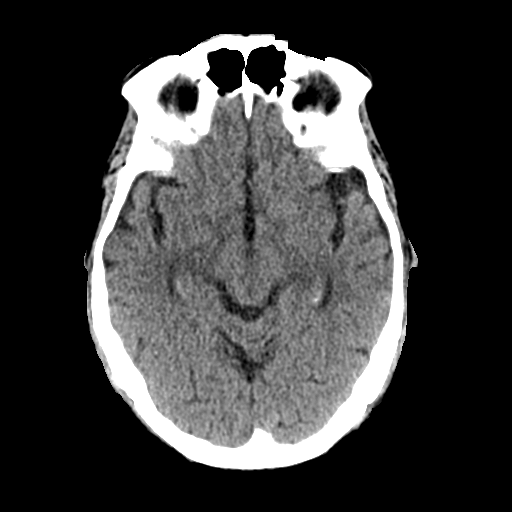
[im 15/30  brain]
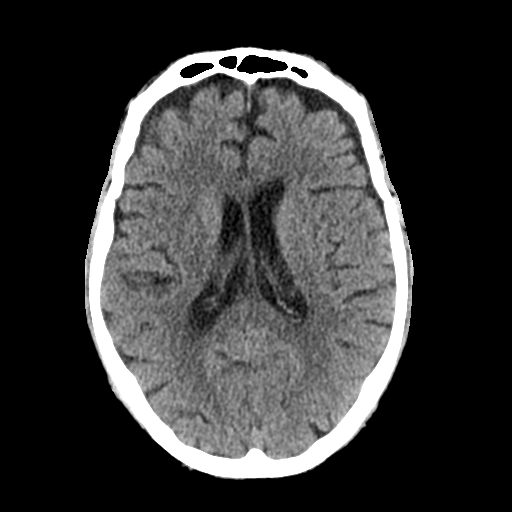
[im 19/30  brain]
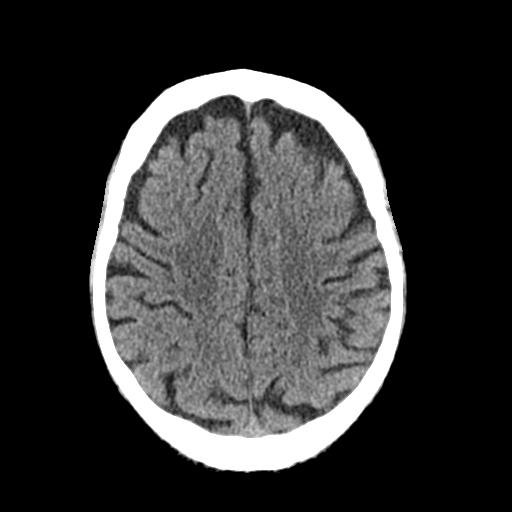
[im 19/30  bone]
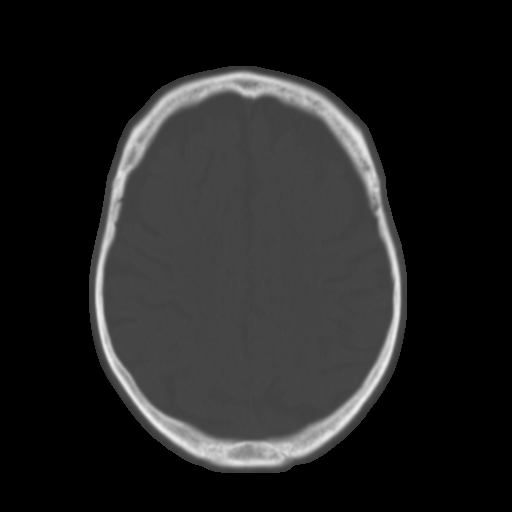
[im 22/30  brain]
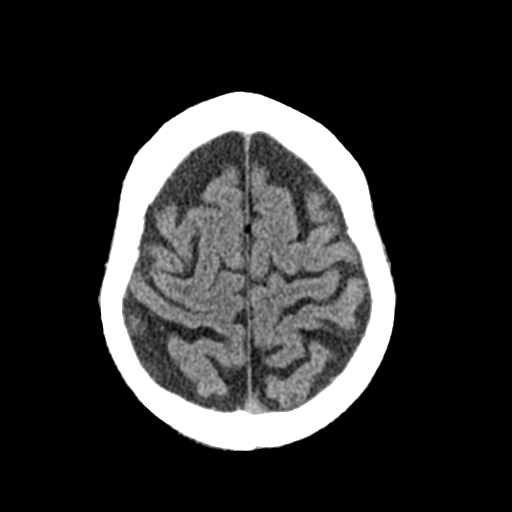
[im 26/30  brain]
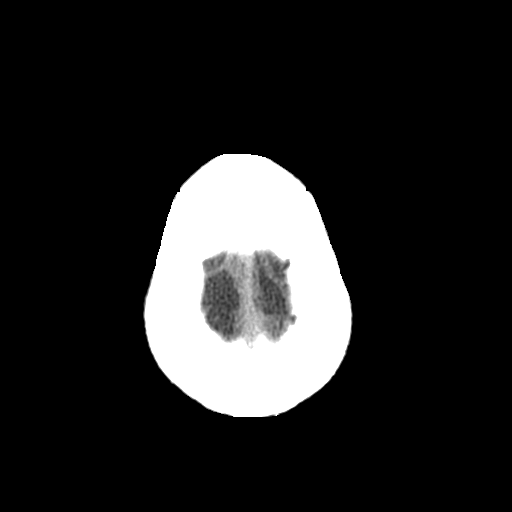

[Series 4: coronal soft tissue · coronal · 0.30mm/px · 3 of 64 slices shown]
[im 22/64  brain]
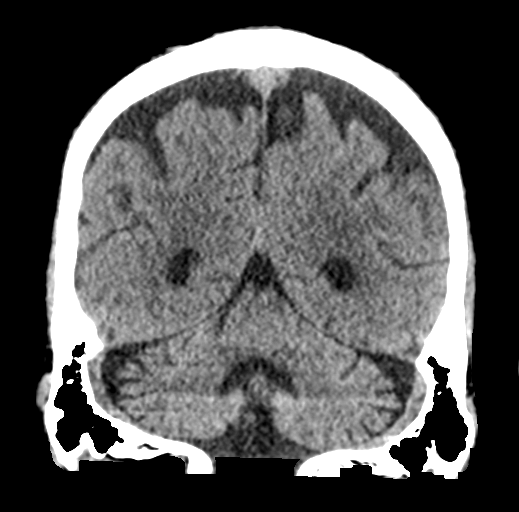
[im 29/64  brain]
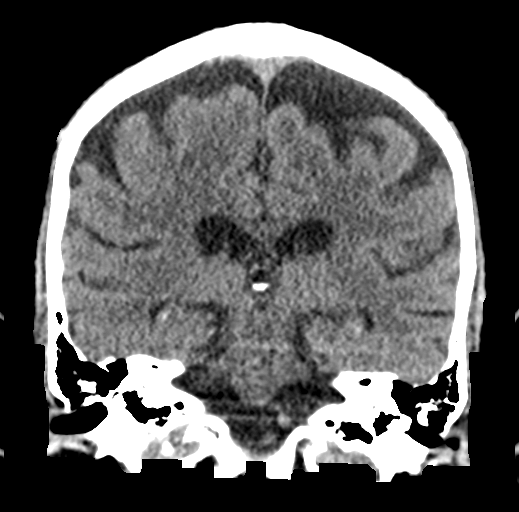
[im 36/64  brain]
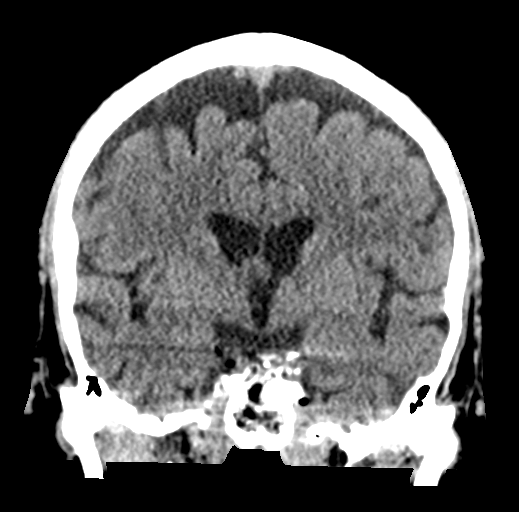

[Series 5: sagittal soft tissue · sagittal · 0.30mm/px · 3 of 52 slices shown]
[im 18/52  brain]
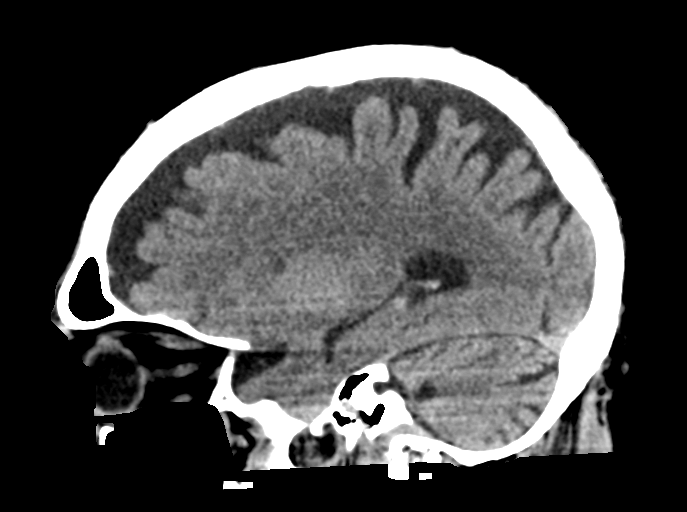
[im 26/52  brain]
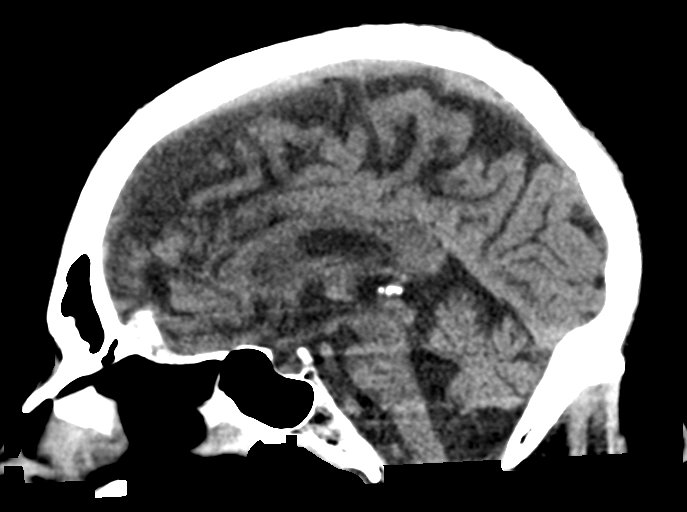
[im 35/52  brain]
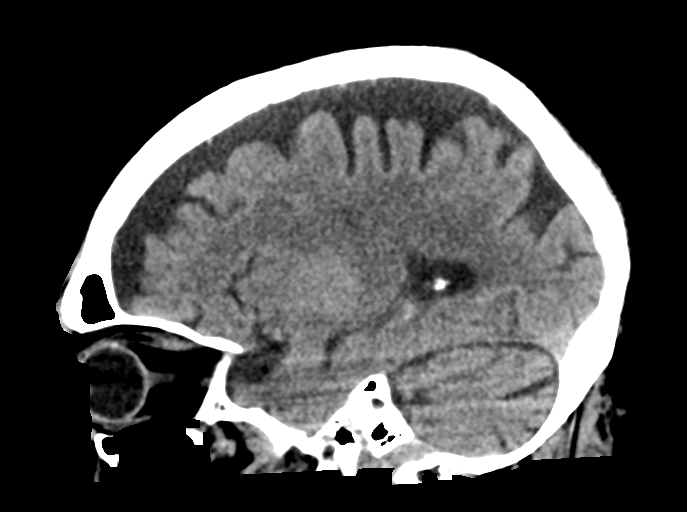

[16 of 47 positions shown; findings below may reference images not displayed]

FINDINGS: CT HEAD FINDINGS

Brain: No evidence of acute infarction, hemorrhage, hydrocephalus,
extra-axial collection or mass lesion/mass effect. Unchanged mild
asymmetry of the right lateral ventricle.

Vascular: No hyperdense vessel or unexpected calcification.

Skull: Normal. Negative for fracture or focal lesion.

Sinuses/Orbits: No acute finding.

Other: None.

CT CERVICAL SPINE FINDINGS

Alignment: Normal.

Skull base and vertebrae: No acute fracture. No primary bone lesion
or focal pathologic process.

Soft tissues and spinal canal: No prevertebral fluid or swelling. No
visible canal hematoma.

Disc levels:  Minimal multilevel disc space height loss.

Upper chest: Negative.

Other: None.
IMPRESSION: 1. No acute intracranial pathology.
2. No fracture or static subluxation of the cervical spine.

## 2024-05-24 IMAGING — CT CT CERVICAL SPINE W/O CM
3 of 4 series · 10 of 33 positions shown, 12 images · non-contrast
Comparison: 10/23/2021

CLINICAL DATA: Neck trauma, fall 4 days ago



[Series 6: sagittal bone · sagittal · 0.26mm/px · 5 of 49 slices shown, 6 images]
[im 17/49  bone]
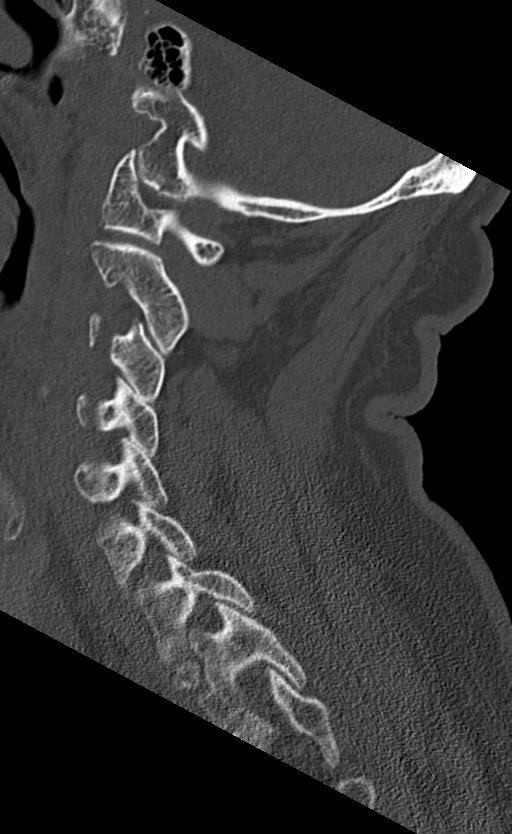
[im 21/49  bone]
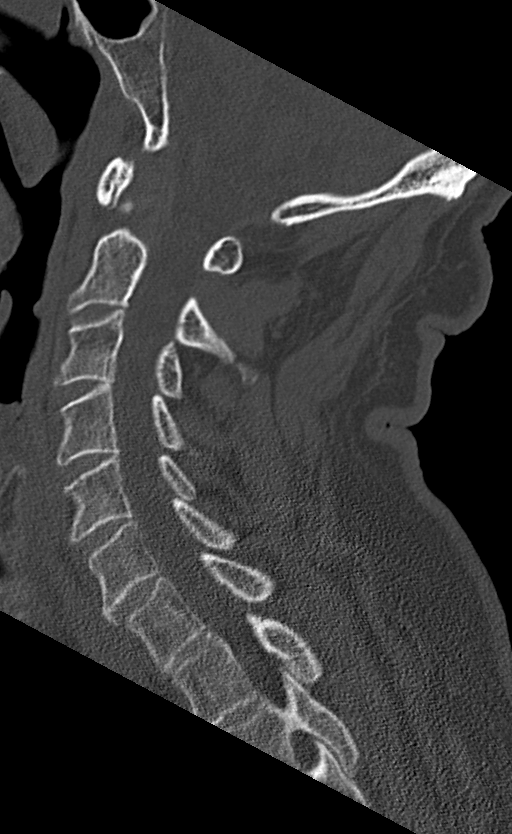
[im 25/49  soft-tissue]
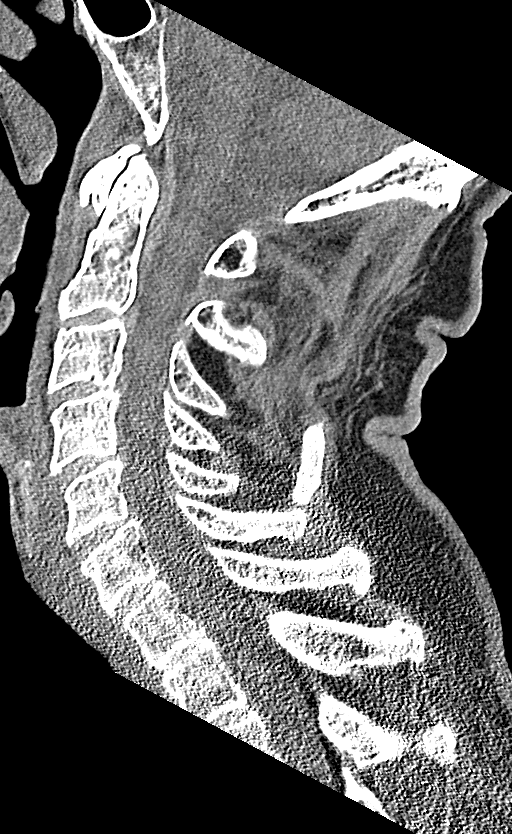
[im 25/49  bone]
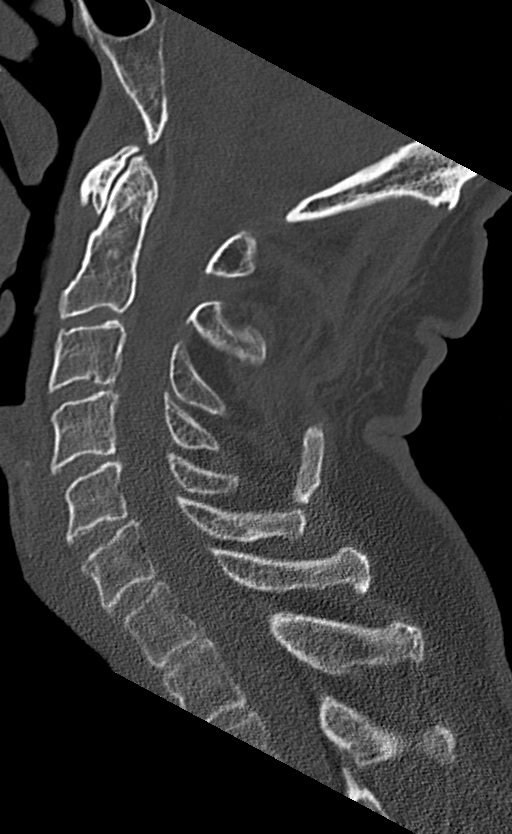
[im 29/49  bone]
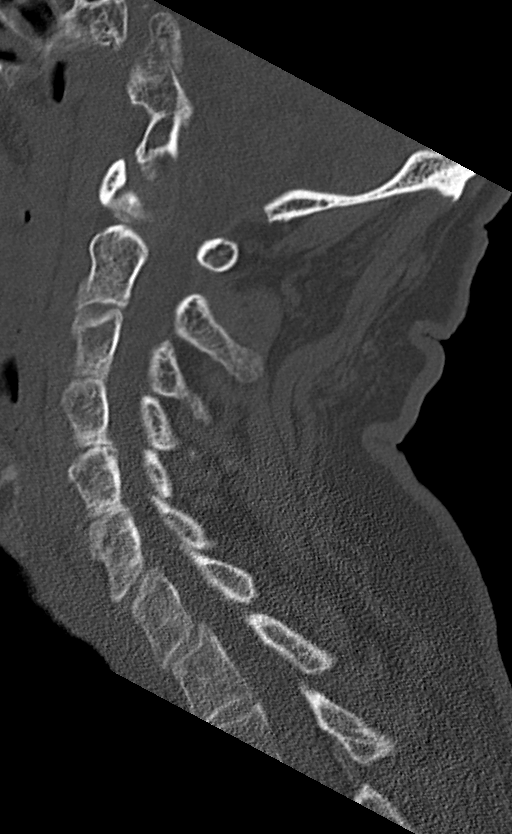
[im 33/49  bone]
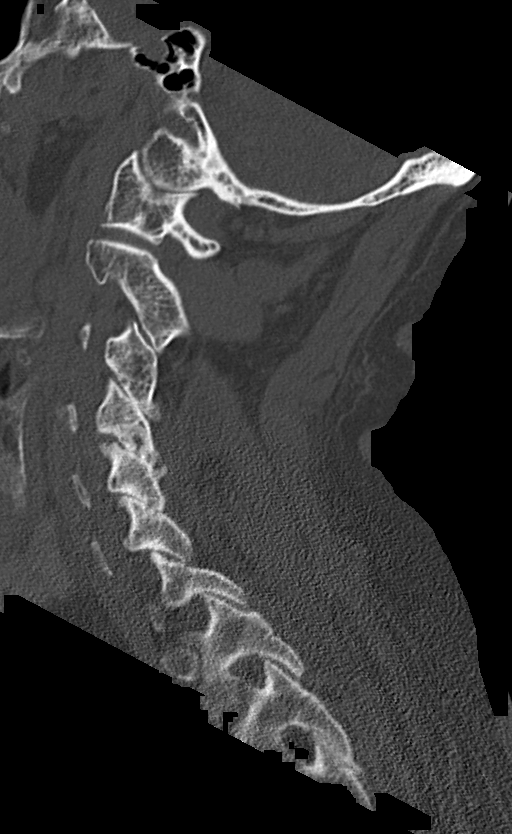

[Series 7: coronal bone · coronal · 0.19mm/px · 3 of 51 slices shown]
[im 12/51  bone]
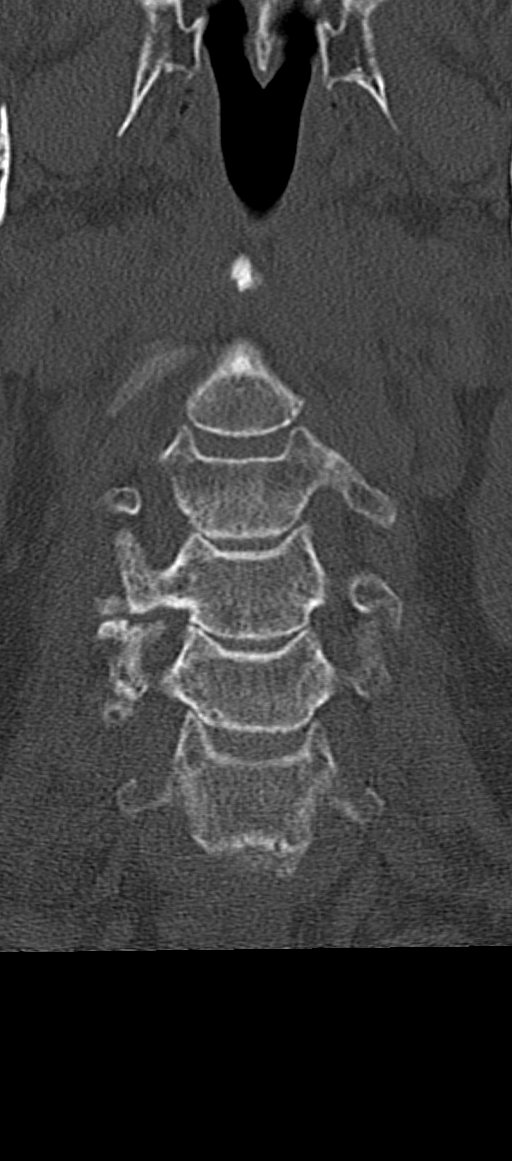
[im 21/51  bone]
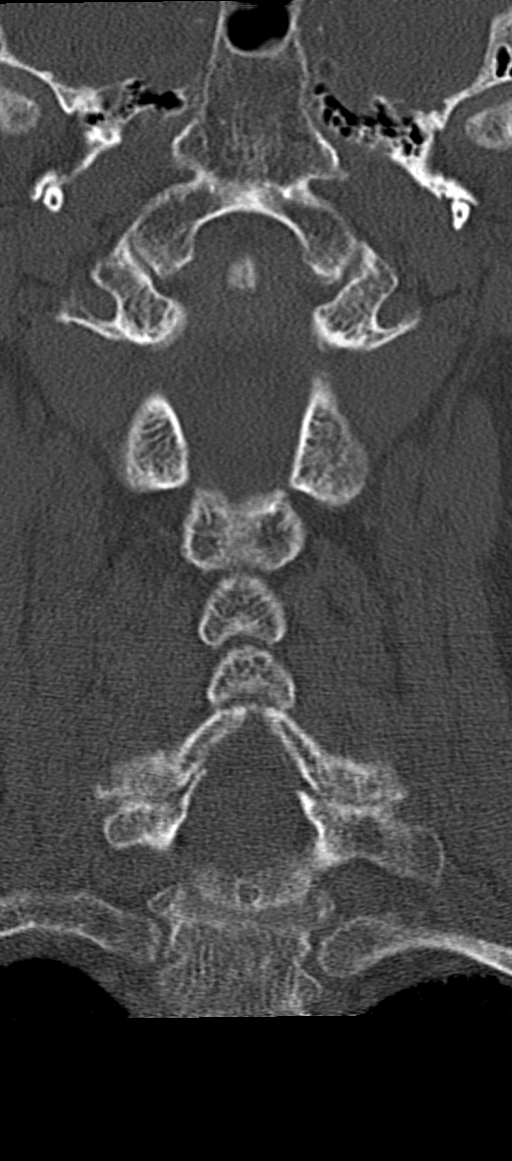
[im 30/51  bone]
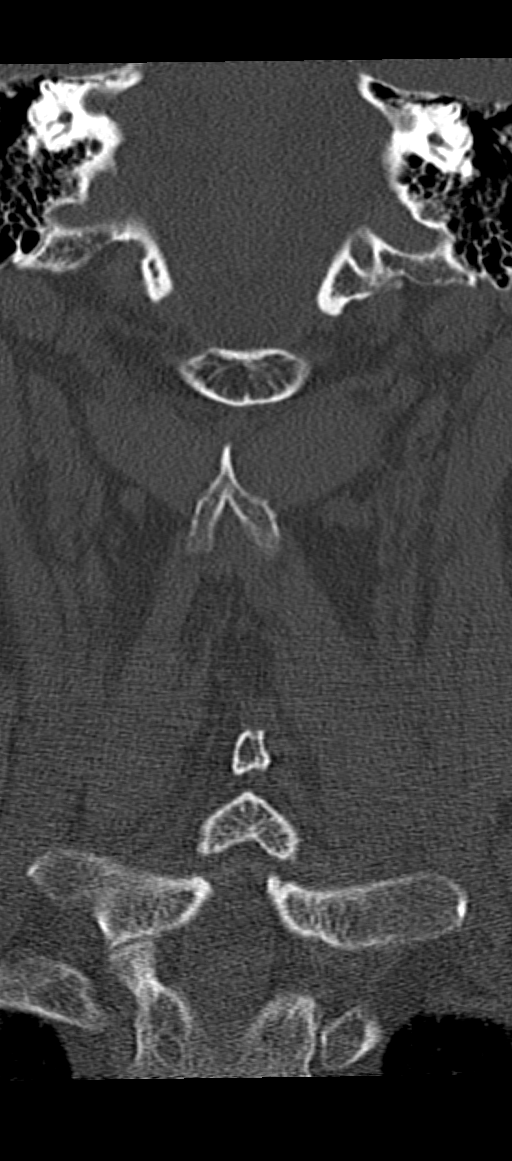

[Series 8: orthogonal bone · axial · 0.26mm/px · z∈[+202,+273]mm · 2 of 100 slices shown, 3 images]
[im 29/100  soft-tissue]
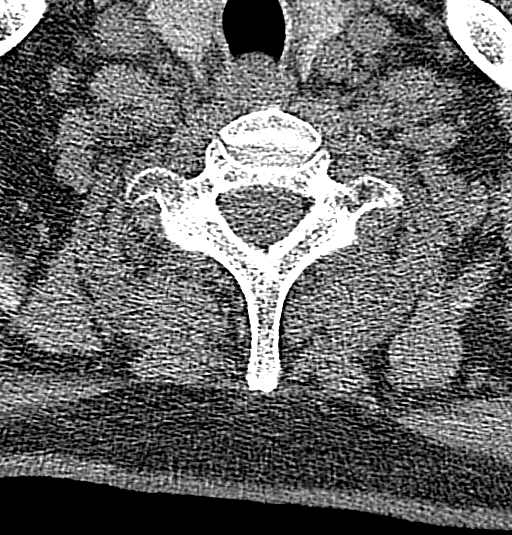
[im 29/100  bone]
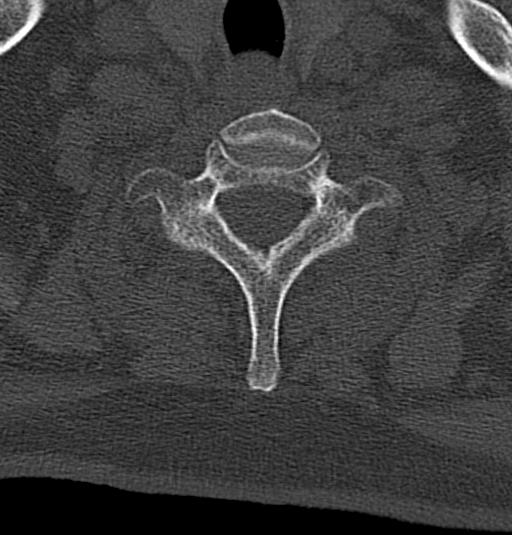
[im 71/100  bone]
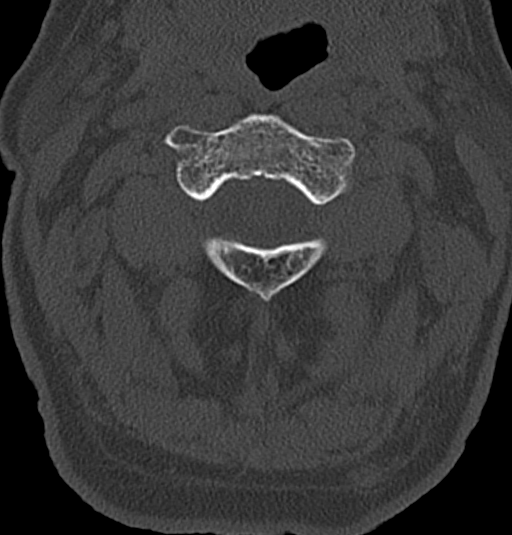

[10 of 33 positions shown; findings below may reference images not displayed]

FINDINGS: CT HEAD FINDINGS

Brain: No evidence of acute infarction, hemorrhage, hydrocephalus,
extra-axial collection or mass lesion/mass effect. Unchanged mild
asymmetry of the right lateral ventricle.

Vascular: No hyperdense vessel or unexpected calcification.

Skull: Normal. Negative for fracture or focal lesion.

Sinuses/Orbits: No acute finding.

Other: None.

CT CERVICAL SPINE FINDINGS

Alignment: Normal.

Skull base and vertebrae: No acute fracture. No primary bone lesion
or focal pathologic process.

Soft tissues and spinal canal: No prevertebral fluid or swelling. No
visible canal hematoma.

Disc levels:  Minimal multilevel disc space height loss.

Upper chest: Negative.

Other: None.
IMPRESSION: 1. No acute intracranial pathology.
2. No fracture or static subluxation of the cervical spine.

## 2024-06-06 ENCOUNTER — Other Ambulatory Visit: Payer: Self-pay

## 2024-06-06 ENCOUNTER — Emergency Department

## 2024-06-06 ENCOUNTER — Inpatient Hospital Stay
Admission: EM | Admit: 2024-06-06 | Source: Home / Self Care | Attending: Obstetrics and Gynecology | Admitting: Obstetrics and Gynecology

## 2024-06-06 DIAGNOSIS — I10 Essential (primary) hypertension: Secondary | ICD-10-CM | POA: Diagnosis present

## 2024-06-06 DIAGNOSIS — E119 Type 2 diabetes mellitus without complications: Secondary | ICD-10-CM

## 2024-06-06 DIAGNOSIS — J9601 Acute respiratory failure with hypoxia: Principal | ICD-10-CM | POA: Diagnosis present

## 2024-06-06 DIAGNOSIS — Z95810 Presence of automatic (implantable) cardiac defibrillator: Secondary | ICD-10-CM | POA: Diagnosis present

## 2024-06-06 DIAGNOSIS — C61 Malignant neoplasm of prostate: Secondary | ICD-10-CM | POA: Diagnosis present

## 2024-06-06 DIAGNOSIS — I5023 Acute on chronic systolic (congestive) heart failure: Secondary | ICD-10-CM

## 2024-06-06 DIAGNOSIS — J441 Chronic obstructive pulmonary disease with (acute) exacerbation: Secondary | ICD-10-CM | POA: Diagnosis present

## 2024-06-06 DIAGNOSIS — Z8673 Personal history of transient ischemic attack (TIA), and cerebral infarction without residual deficits: Secondary | ICD-10-CM

## 2024-06-06 DIAGNOSIS — I509 Heart failure, unspecified: Secondary | ICD-10-CM

## 2024-06-06 DIAGNOSIS — D631 Anemia in chronic kidney disease: Secondary | ICD-10-CM | POA: Diagnosis present

## 2024-06-06 LAB — CBC WITH DIFFERENTIAL/PLATELET
Abs Immature Granulocytes: 0.03 10*3/uL (ref 0.00–0.07)
Basophils Absolute: 0.2 10*3/uL — ABNORMAL HIGH (ref 0.0–0.1)
Basophils Relative: 1 %
Eosinophils Absolute: 0.4 10*3/uL (ref 0.0–0.5)
Eosinophils Relative: 4 %
HCT: 36.6 % — ABNORMAL LOW (ref 39.0–52.0)
Hemoglobin: 11.5 g/dL — ABNORMAL LOW (ref 13.0–17.0)
Immature Granulocytes: 0 %
Lymphocytes Relative: 23 %
Lymphs Abs: 2.5 10*3/uL (ref 0.7–4.0)
MCH: 30.3 pg (ref 26.0–34.0)
MCHC: 31.4 g/dL (ref 30.0–36.0)
MCV: 96.3 fL (ref 80.0–100.0)
Monocytes Absolute: 1.3 10*3/uL — ABNORMAL HIGH (ref 0.1–1.0)
Monocytes Relative: 12 %
Neutro Abs: 6.3 10*3/uL (ref 1.7–7.7)
Neutrophils Relative %: 60 %
Platelets: 281 10*3/uL (ref 150–400)
RBC: 3.8 MIL/uL — ABNORMAL LOW (ref 4.22–5.81)
RDW: 14.2 % (ref 11.5–15.5)
WBC: 10.8 10*3/uL — ABNORMAL HIGH (ref 4.0–10.5)
nRBC: 0 % (ref 0.0–0.2)

## 2024-06-06 LAB — CBG MONITORING, ED
Glucose-Capillary: 267 mg/dL — ABNORMAL HIGH (ref 70–99)
Glucose-Capillary: 279 mg/dL — ABNORMAL HIGH (ref 70–99)
Glucose-Capillary: 196 mg/dL — ABNORMAL HIGH (ref 70–99)

## 2024-06-06 LAB — PRO BRAIN NATRIURETIC PEPTIDE: Pro Brain Natriuretic Peptide: 10471 pg/mL — ABNORMAL HIGH

## 2024-06-06 LAB — COMPREHENSIVE METABOLIC PANEL WITH GFR
ALT: 6 U/L (ref 0–44)
AST: 18 U/L (ref 15–41)
Albumin: 4 g/dL (ref 3.5–5.0)
Alkaline Phosphatase: 95 U/L (ref 38–126)
Anion gap: 11 (ref 5–15)
BUN: 35 mg/dL — ABNORMAL HIGH (ref 8–23)
CO2: 22 mmol/L (ref 22–32)
Calcium: 8.6 mg/dL — ABNORMAL LOW (ref 8.9–10.3)
Chloride: 102 mmol/L (ref 98–111)
Creatinine, Ser: 3.44 mg/dL — ABNORMAL HIGH (ref 0.61–1.24)
GFR, Estimated: 17 mL/min — ABNORMAL LOW
Glucose, Bld: 210 mg/dL — ABNORMAL HIGH (ref 70–99)
Potassium: 4.4 mmol/L (ref 3.5–5.1)
Sodium: 135 mmol/L (ref 135–145)
Total Bilirubin: 0.5 mg/dL (ref 0.0–1.2)
Total Protein: 7 g/dL (ref 6.5–8.1)

## 2024-06-06 LAB — BLOOD GAS, VENOUS
Acid-base deficit: 2.8 mmol/L — ABNORMAL HIGH (ref 0.0–2.0)
Bicarbonate: 24.1 mmol/L (ref 20.0–28.0)
O2 Saturation: 42.2 %
Patient temperature: 37
pCO2, Ven: 49 mmHg (ref 44–60)
pH, Ven: 7.3 (ref 7.25–7.43)

## 2024-06-06 LAB — TROPONIN T, HIGH SENSITIVITY
Troponin T High Sensitivity: 34 ng/L — ABNORMAL HIGH (ref 0–19)
Troponin T High Sensitivity: 32 ng/L — ABNORMAL HIGH (ref 0–19)

## 2024-06-06 LAB — RESP PANEL BY RT-PCR (RSV, FLU A&B, COVID)  RVPGX2
Influenza A by PCR: NEGATIVE
Influenza B by PCR: NEGATIVE
Resp Syncytial Virus by PCR: NEGATIVE
SARS Coronavirus 2 by RT PCR: NEGATIVE

## 2024-06-06 LAB — HEMOGLOBIN A1C
Hgb A1c MFr Bld: 6 % — ABNORMAL HIGH (ref 4.8–5.6)
Mean Plasma Glucose: 125.5 mg/dL

## 2024-06-06 MED ORDER — ENOXAPARIN SODIUM 30 MG/0.3ML IJ SOSY
30.0000 mg | PREFILLED_SYRINGE | INTRAMUSCULAR | Status: AC
  Administered 2024-06-06 – 2024-06-13 (×8): 30 mg via SUBCUTANEOUS
  Filled 2024-06-06 (×8): qty 0.3

## 2024-06-06 MED ORDER — PANTOPRAZOLE SODIUM 40 MG IV SOLR
40.0000 mg | Freq: Two times a day (BID) | INTRAVENOUS | Status: DC
  Administered 2024-06-06 – 2024-06-07 (×4): 40 mg via INTRAVENOUS
  Filled 2024-06-06 (×4): qty 10

## 2024-06-06 MED ORDER — IPRATROPIUM-ALBUTEROL 0.5-2.5 (3) MG/3ML IN SOLN: 6.0000 mL | Freq: Once | RESPIRATORY_TRACT | Status: AC

## 2024-06-06 MED ORDER — ENOXAPARIN SODIUM 40 MG/0.4ML IJ SOSY: 40.0000 mg | PREFILLED_SYRINGE | INTRAMUSCULAR | Status: DC

## 2024-06-06 MED ORDER — ACETAMINOPHEN 325 MG PO TABS
650.0000 mg | ORAL_TABLET | Freq: Four times a day (QID) | ORAL | Status: AC | PRN
  Administered 2024-06-07 (×2): 650 mg via ORAL
  Filled 2024-06-06 (×2): qty 2

## 2024-06-06 MED ORDER — MORPHINE SULFATE (PF) 2 MG/ML IV SOLN
2.0000 mg | INTRAVENOUS | Status: DC | PRN
  Administered 2024-06-07: 2 mg via INTRAVENOUS
  Filled 2024-06-06: qty 1

## 2024-06-06 MED ORDER — TAMSULOSIN HCL 0.4 MG PO CAPS
0.8000 mg | ORAL_CAPSULE | Freq: Every day | ORAL | Status: AC
  Administered 2024-06-06 – 2024-06-13 (×7): 0.8 mg via ORAL
  Filled 2024-06-06 (×8): qty 2

## 2024-06-06 MED ORDER — CARVEDILOL 6.25 MG PO TABS
6.2500 mg | ORAL_TABLET | Freq: Two times a day (BID) | ORAL | Status: DC
  Administered 2024-06-07: 6.25 mg via ORAL
  Filled 2024-06-06: qty 1

## 2024-06-06 MED ORDER — ASPIRIN 81 MG PO TBEC
81.0000 mg | DELAYED_RELEASE_TABLET | Freq: Every day | ORAL | Status: AC
  Administered 2024-06-07 – 2024-06-13 (×7): 81 mg via ORAL
  Filled 2024-06-06 (×7): qty 1

## 2024-06-06 MED ORDER — OXYCODONE HCL 5 MG PO TABS: 5.0000 mg | ORAL_TABLET | ORAL | Status: DC | PRN

## 2024-06-06 MED ORDER — INSULIN ASPART 100 UNIT/ML IJ SOLN
0.0000 [IU] | Freq: Every day | INTRAMUSCULAR | Status: DC
  Administered 2024-06-07: 2 [IU] via SUBCUTANEOUS
  Filled 2024-06-06: qty 1
  Filled 2024-06-06: qty 2

## 2024-06-06 MED ORDER — FERROUS SULFATE 325 (65 FE) MG PO TABS
325.0000 mg | ORAL_TABLET | ORAL | Status: AC
  Administered 2024-06-07 – 2024-06-13 (×4): 325 mg via ORAL
  Filled 2024-06-06 (×5): qty 1

## 2024-06-06 MED ORDER — IPRATROPIUM-ALBUTEROL 0.5-2.5 (3) MG/3ML IN SOLN: 3.0000 mL | Freq: Four times a day (QID) | RESPIRATORY_TRACT | Status: DC | PRN

## 2024-06-06 MED ORDER — POLYETHYLENE GLYCOL 3350 17 G PO PACK: 17.0000 g | PACK | Freq: Every day | ORAL | Status: DC | PRN

## 2024-06-06 MED ORDER — ATORVASTATIN CALCIUM 20 MG PO TABS
40.0000 mg | ORAL_TABLET | Freq: Every day | ORAL | Status: AC
  Administered 2024-06-06 – 2024-06-13 (×7): 40 mg via ORAL
  Filled 2024-06-06 (×8): qty 2

## 2024-06-06 MED ORDER — METHYLPREDNISOLONE SODIUM SUCC 40 MG IJ SOLR
40.0000 mg | INTRAMUSCULAR | Status: AC
  Administered 2024-06-06 – 2024-06-07 (×2): 40 mg via INTRAVENOUS
  Filled 2024-06-06 (×2): qty 1

## 2024-06-06 MED ORDER — GABAPENTIN 300 MG PO CAPS
300.0000 mg | ORAL_CAPSULE | Freq: Every day | ORAL | Status: AC
  Administered 2024-06-06 – 2024-06-13 (×7): 300 mg via ORAL
  Filled 2024-06-06 (×8): qty 1

## 2024-06-06 MED ORDER — ACETAMINOPHEN 650 MG RE SUPP: 650.0000 mg | Freq: Four times a day (QID) | RECTAL | Status: AC | PRN

## 2024-06-06 MED ORDER — INSULIN ASPART 100 UNIT/ML IJ SOLN
0.0000 [IU] | Freq: Three times a day (TID) | INTRAMUSCULAR | Status: DC
  Administered 2024-06-06 – 2024-06-07 (×3): 5 [IU] via SUBCUTANEOUS
  Administered 2024-06-07 (×2): 2 [IU] via SUBCUTANEOUS
  Administered 2024-06-08: 7 [IU] via SUBCUTANEOUS
  Administered 2024-06-08: 3 [IU] via SUBCUTANEOUS
  Filled 2024-06-06: qty 2
  Filled 2024-06-06: qty 7
  Filled 2024-06-06 (×2): qty 5
  Filled 2024-06-06: qty 2
  Filled 2024-06-06: qty 5
  Filled 2024-06-06: qty 3

## 2024-06-06 MED ORDER — ONDANSETRON HCL 4 MG/2ML IJ SOLN: 4.0000 mg | Freq: Four times a day (QID) | INTRAMUSCULAR | Status: AC | PRN

## 2024-06-06 MED ORDER — LISINOPRIL 20 MG PO TABS
20.0000 mg | ORAL_TABLET | Freq: Every day | ORAL | Status: DC
  Administered 2024-06-07: 20 mg via ORAL
  Filled 2024-06-06: qty 1

## 2024-06-06 MED ORDER — INSULIN GLARGINE-YFGN 100 UNIT/ML ~~LOC~~ SOLN
5.0000 [IU] | Freq: Every day | SUBCUTANEOUS | Status: DC
  Administered 2024-06-07 – 2024-06-11 (×5): 5 [IU] via SUBCUTANEOUS
  Filled 2024-06-06 (×8): qty 0.05

## 2024-06-06 MED ORDER — FUROSEMIDE 10 MG/ML IJ SOLN
40.0000 mg | Freq: Two times a day (BID) | INTRAMUSCULAR | Status: DC
  Administered 2024-06-06 – 2024-06-07 (×3): 40 mg via INTRAVENOUS
  Filled 2024-06-06 (×3): qty 4

## 2024-06-06 MED ORDER — IPRATROPIUM-ALBUTEROL 0.5-2.5 (3) MG/3ML IN SOLN
RESPIRATORY_TRACT | Status: AC
  Administered 2024-06-06: 6 mL via RESPIRATORY_TRACT
  Filled 2024-06-06: qty 6

## 2024-06-06 MED ORDER — ONDANSETRON HCL 4 MG PO TABS: 4.0000 mg | ORAL_TABLET | Freq: Four times a day (QID) | ORAL | Status: AC | PRN

## 2024-06-06 MED ORDER — TRAZODONE HCL 50 MG PO TABS
50.0000 mg | ORAL_TABLET | Freq: Every day | ORAL | Status: AC
  Administered 2024-06-06 – 2024-06-13 (×7): 50 mg via ORAL
  Filled 2024-06-06 (×8): qty 1

## 2024-06-06 MED ORDER — FUROSEMIDE 10 MG/ML IJ SOLN
80.0000 mg | Freq: Once | INTRAMUSCULAR | Status: AC
  Administered 2024-06-06: 80 mg via INTRAVENOUS
  Filled 2024-06-06: qty 8

## 2024-06-06 NOTE — ED Notes (Signed)
 Dr Roann stated pt could come off BIPAP while he eats but keep him off bipap for an hour after with a nasal canula

## 2024-06-06 NOTE — ED Provider Notes (Signed)
 "  [facility] Provider Note    Event Date/Time   First MD Initiated Contact with Patient 06/06/24 [phone]     (approximate)   History   Chief Complaint Respiratory Distress   HPI  Phillip Reyes is a 84 y.o. male with past medical history of hypertension, diabetes, HFrEF, COPD, stroke, and CKD who presents to the ED complaining of shortness of breath.  Patient reports 2 days of increasing difficulty breathing associated with productive cough.  He denies any pain in his chest but does report some discomfort in his lower abdomen radiating around to his back, worse with coughing.  Per EMS, patient found in some respiratory distress with audible wheezing, was given 125 mg IV Solu-Medrol , 2 g IV magnesium , 1 DuoNeb, and 1 albuterol  en route without significant improvement.  He was placed on CPAP, states this has been helping him on arrival to the ED.     Physical Exam   Triage Vital Signs: ED Triage Vitals  Encounter Vitals Group     BP      Girls Systolic BP Percentile      Girls Diastolic BP Percentile      Boys Systolic BP Percentile      Boys Diastolic BP Percentile      Pulse      Resp      Temp      Temp src      SpO2      Weight      Height      Head Circumference      Peak Flow      Pain Score      Pain Loc      Pain Education      Exclude from Growth Chart     Most recent vital signs: Vitals:   06/06/24 0713 06/06/24 0716  BP:  (!) 203/84  Pulse: 74 71  Resp: (!) 26 (!) 24  Temp: 98.6 F (37 C)   SpO2: 100% 100%    Constitutional: Alert and oriented. Eyes: Conjunctivae are normal. Head: Atraumatic. Nose: No congestion/rhinnorhea. Mouth/Throat: Mucous membranes are moist.  Cardiovascular: Normal rate, regular rhythm. Grossly normal heart sounds.  2+ radial pulses bilaterally. Respiratory: Tachypneic with increased respiratory effort and accessory muscle use noted, inspiratory and expiratory wheezing noted  throughout. Gastrointestinal: Soft and nontender. No distention. Musculoskeletal: No lower extremity tenderness, 1+ pitting edema to mid shins bilaterally. Neurologic:  Normal speech and language. No gross focal neurologic deficits are appreciated.    ED Results / Procedures / Treatments   Labs (all labs ordered are listed, but only abnormal results are displayed) Labs Reviewed  CBC WITH DIFFERENTIAL/PLATELET - Abnormal; Notable for the following components:      Result Value   WBC 10.8 (*)    RBC 3.80 (*)    Hemoglobin 11.5 (*)    HCT 36.6 (*)    Monocytes Absolute 1.3 (*)    Basophils Absolute 0.2 (*)    All other components within normal limits  COMPREHENSIVE METABOLIC PANEL WITH GFR - Abnormal; Notable for the following components:   Glucose, Bld 210 (*)    BUN 35 (*)    Creatinine, Ser 3.44 (*)    Calcium  8.6 (*)    GFR, Estimated 17 (*)    All other components within normal limits  PRO BRAIN NATRIURETIC PEPTIDE - Abnormal; Notable for the following components:   Pro Brain Natriuretic Peptide 10,471.0 (*)    All other components within normal  limits  BLOOD GAS, VENOUS - Abnormal; Notable for the following components:   Acid-base deficit 2.8 (*)    All other components within normal limits  TROPONIN T, HIGH SENSITIVITY - Abnormal; Notable for the following components:   Troponin T High Sensitivity 34 (*)    All other components within normal limits     EKG  ED ECG REPORT I, Carlin Palin, the attending physician, personally viewed and interpreted this ECG.   Date: 06/06/2024  EKG Time: 7:17  Rate: 74  Rhythm: normal sinus rhythm  Axis: LAD  Intervals:nonspecific intraventricular conduction delay  ST&T Change: None  RADIOLOGY Chest x-ray reviewed and interpreted by me with pulmonary edema and small pleural effusions noted bilaterally, no focal infiltrate.  PROCEDURES:  Critical Care performed: Yes, see critical care procedure note(s)  .Critical  Care  Performed by: Palin Carlin, MD Authorized by: Palin Carlin, MD   Critical care provider statement:    Critical care time (minutes):  30   Critical care time was exclusive of:  Separately billable procedures and treating other patients and teaching time   Critical care was necessary to treat or prevent imminent or life-threatening deterioration of the following conditions:  Respiratory failure   Critical care was time spent personally by me on the following activities:  Development of treatment plan with patient or surrogate, discussions with consultants, evaluation of patient's response to treatment, examination of patient, ordering and review of laboratory studies, ordering and review of radiographic studies, ordering and performing treatments and interventions, pulse oximetry, re-evaluation of patient's condition and review of old charts   I assumed direction of critical care for this patient from another provider in my specialty: no     Care discussed with: admitting provider      MEDICATIONS ORDERED IN ED: Medications  furosemide  (LASIX ) injection 80 mg (has no administration in time range)  ipratropium-albuterol  (DUONEB) 0.5-2.5 (3) MG/3ML nebulizer solution 6 mL (6 mLs Nebulization Given 06/06/24 0716)     IMPRESSION / MDM / ASSESSMENT AND PLAN / ED COURSE  I reviewed the triage vital signs and the nursing notes.                              84 y.o. male with past medical history of hypertension, diabetes, HFrEF, COPD, CKD, and stroke who presents to the ED complaining of 2 days of increasing difficulty breathing with productive cough.  Patient's presentation is most consistent with acute presentation with potential threat to life or bodily function.  Differential diagnosis includes, but is not limited to, COPD exacerbation, CHF exacerbation, ACS, PE, pneumonia, pneumothorax, anemia, electrolyte abnormality, AKI, viral syndrome.  Patient ill-appearing and in moderate  respiratory distress, vital signs remarkable for hypertension and tachypnea.  He was immediately transition from CPAP to BiPAP, appears to be breathing more comfortably on BiPAP machine.  We will treat ongoing wheezing with DuoNebs, EKG shows no evidence of arrhythmia or ischemia and chest x-ray as well as labs are pending.  Chest x-ray concerning for pulmonary edema and elevated BNP consistent with this, will diurese with IV Lasix .  VBG reassuring without significant hypercapnia, mild troponin elevation noted but no symptoms to suggest ACS.  No significant anemia or leukocytosis noted, renal function stable compared to previous without acute electrolyte abnormality.  Patient's work of breathing does appear significantly improved on BiPAP, case discussed with hospitalist for admission.      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   Willo Dunnings, MD 06/06/24 [phone]  "

## 2024-06-06 NOTE — ED Triage Notes (Signed)
 Pt BIB [facility] from home for [facility] x1 day. Pt satting 88% with neb treatment going. CPAP initiated by EMS. O2 93% Pt also complains of lower abd pain radiating to back 125 solu medrol  1 duo neb 1 albuterol  neb 2g mag 243/94 HR 62

## 2024-06-06 NOTE — H&P (Addendum)
 "  History and Physical    Carlito Bogert FMW:[id number] DOB: 15-Jan-1941 DOA: 06/06/2024  DOS: the patient was seen and examined on 06/06/2024  PCP: [facility]   Patient coming from: Home  I have personally briefly reviewed patient's old medical records in [facility]  Chief Complaint: Shortness of breath since yesterday  HPI: Juelz Whittenberg is a pleasant 84 y.o. male with medical history significant for COPD not on home oxygen, CHF, DM, HTN,HLD,  GERD, CKD 4 who came into [facility] ED via EMS for shortness of breath.  Patient was started on CPAP for room air saturation of 88% with neb treatment by EMS.  Patient also complained of lower abdominal pain radiating to back, Solu-Medrol  125 mg IV, DuoNeb, 2 g of magnesium  given by EMS.  EMS vitals 243/94, heart rate 62.  Patient stated that he had shortness of breath starting yesterday and he was not able to breathe today.  EMS was called.  He denied any fever, chills, nausea, vomiting, chest pain, palpitations, cough, hemoptysis, hematemesis or melena.  ED Course: Upon arrival to the ED, patient is found to be hypertensive at 203/84, tachypneic at 24, leukocytes at 10.8, hemoglobin 11.5, BUN 35, creatinine 3.44, proBNP was 10,471, ABG showed pCO2 of 70.  Troponins were 34, EKG shows normal sinus rhythm, chest x-ray showed pulmonary edema with a small pleural effusion bilaterally.  Patient was given 80 mg of IV Lasix  x 1, nebulization.  Hospitalist service was consulted for evaluation for admission for acute hypoxemic respiratory failure likely due to COPD and CHF.  Review of Systems:  ROS  All other systems negative except as noted in the HPI.  Past Medical History:  Diagnosis Date   AICD (automatic cardioverter/defibrillator) present    Arthritis    Cancer (HCC)    COPD (chronic obstructive pulmonary disease) (HCC)    Diabetes mellitus without complication (HCC)    Edentulous    Full dentures    Grade II diastolic dysfunction     Hypertension    Presence of permanent cardiac pacemaker    Renal disorder    CKD -4   Stroke (HCC) 2019   No residual deficits    Past Surgical History:  Procedure Laterality Date   APPENDECTOMY     CATARACT EXTRACTION W/PHACO Left 08/02/2023   Procedure: CATARACT EXTRACTION PHACO AND INTRAOCULAR LENS PLACEMENT (IOC) LEFT DIABETIC MALYUGIN VISION BLUE OMIDRIA  20.02, 01:44.7;  Surgeon: Enola Feliciano Hugger, MD;  Location: [facility];  Service: Ophthalmology;  Laterality: Left;   INTRAMEDULLARY (IM) NAIL INTERTROCHANTERIC Right 09/15/2021   Procedure: INTRAMEDULLARY (IM) NAIL INTERTROCHANTRIC;  Surgeon: Kathlynn Sharper, MD;  Location: [facility] ORS;  Service: Orthopedics;  Laterality: Right;   THROAT SURGERY       reports that he quit smoking about 2 years ago. His smoking use included cigarettes. He has a 30.5 pack-year smoking history. He has never used smokeless tobacco. He reports that he does not drink alcohol and does not use drugs.  Allergies[1]  Family History  Problem Relation Age of Onset   Congestive Heart Failure Father    Other Mother        unknown medical history    Prior to Admission medications  Medication Sig Start Date End Date Taking? Authorizing Provider  acetaminophen  (TYLENOL ) 500 MG tablet Take 2 tablets (1,000 mg total) by mouth 3 (three) times daily as needed for mild pain, moderate pain or headache. 09/19/21   Charlene Debby BROCKS, PA-C  albuterol  (VENTOLIN   HFA) 108 (90 Base) MCG/ACT inhaler Inhale 2 puffs into the lungs every 6 (six) hours as needed for wheezing or shortness of breath.    [provider]  aspirin  EC 81 MG tablet Take 81 mg by mouth daily.    [provider]  atorvastatin  (LIPITOR ) 80 MG tablet Take 40 mg by mouth at bedtime.    [provider]  budesonide -formoterol  (SYMBICORT ) 160-4.5 MCG/ACT inhaler Inhale 2 puffs into the lungs 2 (two) times daily.    [provider]  carvedilol  (COREG ) 6.25 MG  tablet Take 1 tablet (6.25 mg total) by mouth 2 (two) times daily with a meal. 09/23/21   Christobal Guadalajara, MD  citalopram  (CELEXA ) 20 MG tablet Take 20 mg by mouth daily.    [provider]  cyanocobalamin  (VITAMIN B12) 1000 MCG tablet Take 1,000 mcg by mouth daily.    [provider]  docusate sodium  (COLACE) 100 MG capsule Take 1 capsule (100 mg total) by mouth 2 (two) times daily. Patient taking differently: Take 100 mg by mouth daily as needed. 09/23/21   Christobal Guadalajara, MD  ferrous sulfate  325 (65 FE) MG tablet Take 1 tablet (325 mg total) by mouth every other day. 11/03/21   Wouk, Devaughn Sayres, MD  fluticasone  [facility]) 50 MCG/ACT nasal spray Place into both nostrils daily as needed for allergies or rhinitis.    [provider]  furosemide  (LASIX ) 40 MG tablet Take 40 mg by mouth daily.    [provider]  gabapentin  (NEURONTIN ) 300 MG capsule Take 300 mg by mouth at bedtime.    [provider]  insulin  glargine (LANTUS ) 100 UNIT/ML injection Inject 5 Units into the skin at bedtime.    [provider]  insulin  regular (NOVOLIN R) 100 units/mL injection Inject 2-5 Units into the skin 2 (two) times daily before a meal.    [provider]  lidocaine  (XYLOCAINE ) 5 % ointment Apply 1 Application topically as needed. 08/24/22   White, Shelba SAUNDERS, NP  lisinopril  (ZESTRIL ) 20 MG tablet Take 20 mg by mouth daily.    [provider]  omeprazole (PRILOSEC) 20 MG capsule Take 20 mg by mouth 3 (three) times daily before meals.    [provider]  predniSONE  (STERAPRED UNI-PAK 21 TAB) 10 MG (21) TBPK tablet As directed on packaging 10/29/23   Bradler, Evan K, MD  tamsulosin  (FLOMAX ) 0.4 MG CAPS capsule Take 0.8 mg by mouth at bedtime.    [provider]  traZODone  (DESYREL ) 50 MG tablet Take 50 mg by mouth at bedtime.    [provider]    Physical Exam: Vitals:   06/06/24 0714 06/06/24 0716 06/06/24 0800 06/06/24 0930   BP:  (!) 203/84 (!) 171/83 (!) 186/69  Pulse:  71 77 84  Resp:  (!) 24 20 19   Temp:      TempSrc:      SpO2:  100% 100% 100%  Weight: 64.9 kg     Height: 5' 8.5 (1.74 m)       Physical Exam   Constitutional: Alert, awake, on BiPAP, respiratory distress present HEENT: Neck supple Respiratory: Bilateral decreased air entry at the bases, both bases have wheezing, no rales no rhonchi Cardiovascular: Regular rate and rhythm, no murmurs / rubs / gallops.  2+ bilateral pitting edema on lower extremity. 2+ pedal pulses. No carotid bruits.  Abdomen: Soft, no tenderness, Bowel sounds positive.  Musculoskeletal: no clubbing / cyanosis. Good ROM, no contractures. Normal muscle tone.  Skin: no rashes, lesions, ulcers. Neurologic: CN 2-12 grossly intact. Sensation intact, No focal deficit identified Psychiatric: Alert and oriented x 3. Normal mood.    Labs on Admission: I have personally reviewed following labs and imaging studies  CBC: Recent Labs  Lab 06/06/24 0713  WBC 10.8*  NEUTROABS 6.3  HGB 11.5*  HCT 36.6*  MCV 96.3  PLT 281   Basic Metabolic Panel: Recent Labs  Lab 06/06/24 0713  NA 135  K 4.4  CL 102  CO2 22  GLUCOSE 210*  BUN 35*  CREATININE 3.44*  CALCIUM  8.6*   GFR: Estimated Creatinine Clearance: 14.9 mL/min (A) (by C-G formula based on SCr of 3.44 mg/dL (H)). Liver Function Tests: Recent Labs  Lab 06/06/24 0713  AST 18  ALT 6  ALKPHOS 95  BILITOT 0.5  PROT 7.0  ALBUMIN 4.0   No results for input(s): LIPASE, AMYLASE in the last 168 hours. No results for input(s): AMMONIA in the last 168 hours. Coagulation Profile: No results for input(s): INR, PROTIME in the last 168 hours. Cardiac Enzymes: No results for input(s): CKTOTAL, CKMB, CKMBINDEX, TROPONINI, TROPONINIHS in the last 168 hours. BNP (last 3 results) Recent Labs    10/29/23 1152  BNP 893.9*   HbA1C: No results for input(s): HGBA1C in the last 72 hours. CBG: No  results for input(s): GLUCAP in the last 168 hours. Lipid Profile: No results for input(s): CHOL, HDL, LDLCALC, TRIG, CHOLHDL, LDLDIRECT in the last 72 hours. Thyroid Function Tests: No results for input(s): TSH, T4TOTAL, FREET4, T3FREE, THYROIDAB in the last 72 hours. Anemia Panel: No results for input(s): VITAMINB12, FOLATE, FERRITIN, TIBC, IRON, RETICCTPCT in the last 72 hours. Urine analysis:    Component Value Date/Time   COLORURINE STRAW (A) 10/28/2021 0635   APPEARANCEUR CLEAR (A) 10/28/2021 0635   APPEARANCEUR Clear 02/17/2013 0013   LABSPEC 1.006 10/28/2021 0635   LABSPEC 1.029 02/17/2013 0013   PHURINE 5.0 10/28/2021 0635   GLUCOSEU NEGATIVE 10/28/2021 0635   GLUCOSEU >=500 02/17/2013 0013   HGBUR NEGATIVE 10/28/2021 0635   BILIRUBINUR NEGATIVE 10/28/2021 0635   BILIRUBINUR Negative 02/17/2013 0013   KETONESUR NEGATIVE 10/28/2021 0635   PROTEINUR NEGATIVE 10/28/2021 0635   NITRITE NEGATIVE 10/28/2021 0635   LEUKOCYTESUR NEGATIVE 10/28/2021 0635   LEUKOCYTESUR Negative 02/17/2013 0013    Radiological Exams on Admission: I have personally reviewed images DG Chest Portable 1 View Result Date: 06/06/2024 EXAM: 2 VIEW(S) XRAY OF THE CHEST 06/06/2024 07:33:00 AM COMPARISON: AP and lateral chest 10/29/2023. CLINICAL HISTORY: SOB Shortness of breath. FINDINGS: LUNGS AND PLEURA: Small pleural effusions are noted new in the interval. There is increased perihilar vascular congestion and mild generalized interstitial edema superimposed on coarse chronic changes of both lungs. No pneumothorax. HEART AND MEDIASTINUM: Left chest dual lead pacing system/aid and wire insertions are stable. There is mild cardiomegaly. Mediastinal configuration is stable. There is aortic atherosclerosis. BONES AND SOFT TISSUES: Degenerative change and mild dextroscoliosis of the thoracic spine and osteopenia. IMPRESSION: 1. Findings consistent with mild congestive heart  failure/pulmonary edema. 2. Small pleural effusions, new in the interval. 3. Mild cardiomegaly. Electronically signed by: Francis Quam MD 06/06/2024 07:38 AM EST RP Workstation: [id number]    EKG: My personal interpretation of EKG shows: Sinus rhythm at 74 bpm    Assessment/Plan Principal Problem:   CHF (congestive heart failure) (HCC) Active Problems:   Acute respiratory failure with hypoxia (HCC)   COPD with acute exacerbation (HCC)   HTN (hypertension)   H/O: stroke  Prostate cancer (HCC)   Anemia of chronic kidney failure, stage 4 (severe) (HCC)    Assessment and Plan: 84 year old male with multiple medical problems including but not limited to COPD, CHF not on home oxygen, HTN, CKD 4, history of stroke, anemia, prostate cancer who came into ED from home for evaluation of acute onset of shortness of breath.  1.  Acute hypoxemic respiratory failure due to combination of COPD and CHF exacerbation - Patient will be admitted to hospital as inpatient. - He will be treated for both COPD and CHF. - Patient received Solu-Medrol , nebulization and IV Lasix  in the emergency room. - Continue BiPAP to maintain saturation more than 90% - Patient will be placed in progressive care unit  2.  HFpEF with exacerbation, s/p AICD - Last echocardiogram in 2023 showed normal ejection fraction. - Patient was started on Lasix  IV 80 mg in the emergency room. - Patient normally takes torsemide twice a day at home. - He has been placed on CHF pathway. - Continue Lasix  40 mg twice daily  3.  Exacerbation of COPD - Patient was given nebulization and Solu-Medrol . - Continue Solu-Medrol  40 mg twice daily - Continue nebulization - COVID RSV and flu negative - There is no acute signs of infection, no antibiotics at this point.  4.  HTN - Patient is already on Coreg , continue Lasix , lisinopril  20 mg p.o. daily. - Continue to monitor blood pressure  5.  GERD - Patient takes omeprazole at home. -  Continue Protonix  IV while he is in the hospital due to steroid use  6.  BPH - Continue on Flomax   7.  Type 2 diabetes - Patient takes Lantus  at home 5 units - Continue Lantus  5 units as well as insulin  sliding scale.  8.  Anemia - Patient normally takes ferrous sulfate  - Will continue that  9.  HLD - Resume home dose of Lipitor  80 mg daily  10.  Neuropathy - Resume home dose of gabapentin  300 mg at bedtime    DVT prophylaxis: Lovenox  Code Status: Full Code Family Communication: None Disposition Plan: Home versus rehab Consults called: None Admission status: Inpatient, progressive   Nena Rebel, MD [facility] 06/06/2024, 10:28 AM        [1]  Allergies Allergen Reactions   Norco [Hydrocodone -Acetaminophen ] Shortness Of Breath   Hydrochlorothiazide Other (See Comments)   Metformin Diarrhea   Terazosin Other (See Comments)   "

## 2024-06-06 NOTE — Progress Notes (Signed)
 Patient has been taken off bipap and placed on 2 liter nasal cannula. Tolerating well

## 2024-06-06 NOTE — Evaluation (Signed)
 Physical Therapy Evaluation Patient Details Name: Phillip Reyes MRN: [id number] DOB: 19-Oct-1940 Today's Date: 06/06/2024  History of Present Illness  Phillip Reyes is a pleasant 84 y.o. male with medical history significant for COPD not on home oxygen, CHF, DM, HTN,HLD,  GERD, CKD 4 who came into [facility] ED via EMS for shortness of breath.  Clinical Impression  Patient received on stretcher in ED sleeping. He is able to wake to voice. Patient requires mod A for bed mobility and transfers with B hand held A. He is able to take a few side steps with min A. Patient will continue to benefit from skilled PT to improve strength, endurance and independence with mobility.         If plan is discharge home, recommend the following: A lot of help with walking and/or transfers;A lot of help with bathing/dressing/bathroom   Can travel by private vehicle   No    Equipment Recommendations None recommended by PT  Recommendations for Other Services       Functional Status Assessment Patient has had a recent decline in their functional status and demonstrates the ability to make significant improvements in function in a reasonable and predictable amount of time.     Precautions / Restrictions Precautions Precautions: Fall Recall of Precautions/Restrictions: Intact Restrictions Weight Bearing Restrictions Per Provider Order: No      Mobility  Bed Mobility Overal bed mobility: Needs Assistance Bed Mobility: Supine to Sit, Sit to Supine     Supine to sit: Mod assist, HOB elevated, Used rails Sit to supine: Mod assist        Transfers Overall transfer level: Needs assistance Equipment used: 2 person hand held assist Transfers: Sit to/from Stand Sit to Stand: Min assist           General transfer comment: patient able to stand and take a couple of side steps. RR up to 30s with minimal activity O2 sats remained in 90%s on 4 liters via .    Ambulation/Gait                   Stairs            Wheelchair Mobility     Tilt Bed    Modified Rankin (Stroke Patients Only)       Balance Overall balance assessment: Needs assistance Sitting-balance support: Feet supported Sitting balance-Leahy Scale: Good     Standing balance support: Bilateral upper extremity supported, During functional activity Standing balance-Leahy Scale: Fair                               Pertinent Vitals/Pain Pain Assessment Pain Assessment: No/denies pain    Home Living Family/patient expects to be discharged to:: Private residence Living Arrangements: Spouse/significant other (wife is bed bound) Available Help at Discharge: Personal care attendant Type of Home: House Home Access: Ramped entrance       Home Layout: One level Home Equipment: Cane - single Librarian, Academic (2 wheels);Wheelchair - manual;Grab bars - tub/shower      Prior Function Prior Level of Function : Independent/Modified Independent             Mobility Comments: states he should use walker, but does not, denies falls ADLs Comments: Independent has aide during day to assist as needed.     Extremity/Trunk Assessment   Upper Extremity Assessment Upper Extremity Assessment: Defer to OT evaluation    Lower Extremity Assessment Lower Extremity  Assessment: Generalized weakness    Cervical / Trunk Assessment Cervical / Trunk Assessment: Normal  Communication   Communication Communication: Impaired Factors Affecting Communication: Hearing impaired;Reduced clarity of speech    Cognition Arousal: Alert Behavior During Therapy: WFL for tasks assessed/performed   PT - Cognitive impairments: No apparent impairments                         Following commands: Intact       Cueing Cueing Techniques: Verbal cues     General Comments      Exercises     Assessment/Plan    PT Assessment Patient needs continued PT services  PT Problem List Decreased  strength;Decreased activity tolerance;Decreased balance;Decreased mobility       PT Treatment Interventions DME instruction;Gait training;Functional mobility training;Therapeutic activities;Therapeutic exercise;Patient/family education;Balance training    PT Goals (Current goals can be found in the Care Plan section)  Acute Rehab PT Goals Patient Stated Goal: improve PT Goal Formulation: With patient Time For Goal Achievement: 06/20/24 Potential to Achieve Goals: Fair    Frequency Min 2X/week     Co-evaluation PT/OT/SLP Co-Evaluation/Treatment: Yes Reason for Co-Treatment: For patient/therapist safety;To address functional/ADL transfers PT goals addressed during session: Balance;Mobility/safety with mobility         AM-PAC PT 6 Clicks Mobility  Outcome Measure Help needed turning from your back to your side while in a flat bed without using bedrails?: A Lot Help needed moving from lying on your back to sitting on the side of a flat bed without using bedrails?: A Lot Help needed moving to and from a bed to a chair (including a wheelchair)?: A Little Help needed standing up from a chair using your arms (e.g., wheelchair or bedside chair)?: A Little Help needed to walk in hospital room?: A Lot Help needed climbing 3-5 steps with a railing? : A Lot 6 Click Score: 14    End of Session Equipment Utilized During Treatment: Oxygen Activity Tolerance: Patient limited by fatigue;Other (comment) (RR rate) Patient left: in bed;with call bell/phone within reach;with nursing/sitter in room Nurse Communication: Mobility status PT Visit Diagnosis: Other abnormalities of gait and mobility (R26.89);Muscle weakness (generalized) (M62.81);Difficulty in walking, not elsewhere classified (R26.2);Unsteadiness on feet (R26.81)    Time: 8659-8645 PT Time Calculation (min) (ACUTE ONLY): 14 min   Charges:   PT Evaluation $PT Eval Moderate Complexity: 1 Mod   PT General Charges $$ ACUTE PT  VISIT: 1 Visit         Phillip Reyes, PT, GCS 06/06/24,2:06 PM

## 2024-06-06 NOTE — ED Notes (Signed)
Pt taken off bipap at this time 

## 2024-06-06 NOTE — Evaluation (Signed)
 Occupational Therapy Evaluation Patient Details Name: Phillip Reyes MRN: [id number] DOB: August 20, 1940 Today's Date: 06/06/2024   History of Present Illness   Phillip Reyes is a pleasant 84 y.o. 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     Clinical Impressions Patient presenting with decreased Ind in self care,balance, functional mobility, transfers, endurance, and safety awareness. Patient reports living at home with wife (she is bed bound) and they have an aide that comes to assist as needed. Pt endorses being Ind at baseline without use of AD.  Patient currently functioning at mod A for bed mobility. Pt on 4Ls for mobility efforts. Pt stands with min HHA of 2 and takes several side steps. Pt's RR increases into 30's with functional mobility. Pt returning to supine with mod A. Call bell and all needed items within reach. Patient will benefit from acute OT to increase overall independence in the areas of ADLs, functional mobility, and safety awareness in order to safely discharge.     If plan is discharge home, recommend the following:   A lot of help with bathing/dressing/bathroom;A lot of help with walking and/or transfers;Assistance with cooking/housework;Direct supervision/assist for medications management;Assist for transportation;Help with stairs or ramp for entrance;Supervision due to cognitive status     Functional Status Assessment   Patient has had a recent decline in their functional status and demonstrates the ability to make significant improvements in function in a reasonable and predictable amount of time.     Equipment Recommendations   Other (comment) (defer)      Precautions/Restrictions   Precautions Precautions: Fall Recall of Precautions/Restrictions: Intact     Mobility Bed Mobility Overal bed mobility: Needs Assistance Bed Mobility: Supine  to Sit, Sit to Supine     Supine to sit: Mod assist, HOB elevated, Used rails Sit to supine: Mod assist        Transfers Overall transfer level: Needs assistance Equipment used: 2 person hand held assist Transfers: Sit to/from Stand Sit to Stand: Min assist           General transfer comment: patient able to stand and take a couple of side steps. RR up to 30s with minimal activity O2 sats remained in 90%s on 4 liters via [place].      Balance Overall balance assessment: Needs assistance Sitting-balance support: Feet supported Sitting balance-Leahy Scale: Good     Standing balance support: Bilateral upper extremity supported, During functional activity Standing balance-Leahy Scale: Fair                             ADL either performed or assessed with clinical judgement   ADL Overall ADL's : Needs assistance/impaired                         Toilet Transfer: Minimal assistance;Ambulation Toilet Transfer Details (indicate cue type and reason): simulated                 Vision Baseline Vision/History: 1 Wears glasses Patient Visual Report: No change from baseline              Pertinent Vitals/Pain Pain Assessment Pain Assessment: No/denies pain     Extremity/Trunk Assessment Upper Extremity Assessment Upper Extremity Assessment: Generalized weakness   Lower Extremity Assessment Lower Extremity Assessment: Generalized weakness   Cervical /  Trunk Assessment Cervical / Trunk Assessment: Normal   Communication Communication Communication: Impaired Factors Affecting Communication: Hearing impaired;Reduced clarity of speech   Cognition Arousal: Alert Behavior During Therapy: WFL for tasks assessed/performed Cognition: No apparent impairments                               Following commands: Intact       Cueing  General Comments   Cueing Techniques: Verbal cues              Home Living Family/patient  expects to be discharged to:: Private residence Living Arrangements: Spouse/significant other (wife is bed bound) Available Help at Discharge: Personal care attendant Type of Home: House Home Access: Ramped entrance     Home Layout: One level     Bathroom Shower/Tub: Arts Development Officer Toilet: Handicapped height     Home Equipment: Cane - single Librarian, Academic (2 wheels);Wheelchair - manual;Grab bars - tub/shower          Prior Functioning/Environment Prior Level of Function : Independent/Modified Independent             Mobility Comments: states he should use walker, but does not, denies falls ADLs Comments: Independent has aide during day to assist as needed.    OT Problem List: Decreased strength;Decreased activity tolerance;Impaired balance (sitting and/or standing);Decreased safety awareness   OT Treatment/Interventions: Self-care/ADL training;Balance training;Therapeutic exercise;Therapeutic activities;Energy conservation;Patient/family education      OT Goals(Current goals can be found in the care plan section)   Acute Rehab OT Goals Patient Stated Goal: to get stronger OT Goal Formulation: With patient Time For Goal Achievement: 06/20/24 Potential to Achieve Goals: Fair ADL Goals Pt Will Perform Grooming: with supervision;standing Pt Will Perform Lower Body Dressing: with supervision;sit to/from stand Pt Will Transfer to Toilet: with supervision;ambulating Pt Will Perform Toileting - Clothing Manipulation and hygiene: with supervision;sit to/from stand   OT Frequency:  Min 2X/week    Co-evaluation PT/OT/SLP Co-Evaluation/Treatment: Yes Reason for Co-Treatment: For patient/therapist safety;To address functional/ADL transfers PT goals addressed during session: Balance;Mobility/safety with mobility OT goals addressed during session: ADL's and self-care      AM-PAC OT 6 Clicks Daily Activity     Outcome Measure Help from another person  eating meals?: None Help from another person taking care of personal grooming?: A Little Help from another person toileting, which includes using toliet, bedpan, or urinal?: A Lot Help from another person bathing (including washing, rinsing, drying)?: A Lot Help from another person to put on and taking off regular upper body clothing?: A Little Help from another person to put on and taking off regular lower body clothing?: A Lot 6 Click Score: 16   End of Session Equipment Utilized During Treatment: Oxygen Nurse Communication: Mobility status  Activity Tolerance: Patient tolerated treatment well Patient left: in bed;with call bell/phone within reach;with bed alarm set  OT Visit Diagnosis: Unsteadiness on feet (R26.81);Repeated falls (R29.6);Muscle weakness (generalized) (M62.81)                Time: 8659-8645 OT Time Calculation (min): 14 min Charges:  OT General Charges $OT Visit: 1 Visit OT Evaluation $OT Eval Moderate Complexity: 1 [address], [place], OTR/L , CBIS ascom [phone]  06/06/24, 2:28 PM

## 2024-06-06 NOTE — ED Notes (Signed)
 Fall bundle is in place

## 2024-06-06 NOTE — ED Notes (Signed)
 Caregiver requesting to be called for updates. [name] [phone]

## 2024-06-06 NOTE — Progress Notes (Signed)
 PHARMACIST - PHYSICIAN COMMUNICATION  CONCERNING:  Enoxaparin  (Lovenox ) for DVT Prophylaxis    RECOMMENDATION: Patient was prescribed enoxaprin 40mg  q24 hours for VTE prophylaxis.   Filed Weights   06/06/24 0714  Weight: 64.9 kg (143 lb 1.3 oz)    Body mass index is 21.44 kg/m.  Estimated Creatinine Clearance: 14.9 mL/min (A) (by C-G formula based on SCr of 3.44 mg/dL (H)).  Patient is candidate for enoxaparin  30mg  every 24 hours based on CrCl <61ml/min   DESCRIPTION: Pharmacy has adjusted enoxaparin  dose per [facility] policy.  Patient is now receiving enoxaparin  30 mg every 24 hours    Estill CHRISTELLA Lutes, PharmD, BCPS Clinical Pharmacist 06/06/2024 9:50 AM

## 2024-06-06 NOTE — ED Notes (Signed)
 Bed alarm on, fall risk bracelet on, and grip socks on

## 2024-06-06 NOTE — ED Notes (Signed)
Pt placed back on bipap at this time.

## 2024-06-06 NOTE — ED Notes (Addendum)
 O2 remained between 91% and 98% while ambulating with physical therapy

## 2024-06-06 NOTE — ED Notes (Signed)
 Pharmacy messaged about lisinopril  that has not yet been verified. Pharmacy messaged back at 1123 saying med rec has not been completed by pharmacy tech.

## 2024-06-06 NOTE — ED Notes (Signed)
 Pt done eating at this time. Will resume Bipap in an hour

## 2024-06-07 ENCOUNTER — Inpatient Hospital Stay

## 2024-06-07 ENCOUNTER — Inpatient Hospital Stay: Admit: 2024-06-07 | Discharge: 2024-06-07 | Disposition: A | Attending: Hospitalist

## 2024-06-07 LAB — CBC
HCT: 33.5 % — ABNORMAL LOW (ref 39.0–52.0)
Hemoglobin: 11 g/dL — ABNORMAL LOW (ref 13.0–17.0)
MCH: 31 pg (ref 26.0–34.0)
MCHC: 32.8 g/dL (ref 30.0–36.0)
MCV: 94.4 fL (ref 80.0–100.0)
Platelets: 248 10*3/uL (ref 150–400)
RBC: 3.55 MIL/uL — ABNORMAL LOW (ref 4.22–5.81)
RDW: 14.1 % (ref 11.5–15.5)
WBC: 14.1 10*3/uL — ABNORMAL HIGH (ref 4.0–10.5)
nRBC: 0 % (ref 0.0–0.2)

## 2024-06-07 LAB — ECHOCARDIOGRAM COMPLETE
AR max vel: 1.09 cm2
AV Area VTI: 1.12 cm2
AV Area mean vel: 0.97 cm2
AV Mean grad: 10.7 mmHg
AV Peak grad: 19.5 mmHg
Ao pk vel: 2.21 m/s
Area-P 1/2: 4.36 cm2
Calc EF: 53.4 %
Height: 68.5 in
P 1/2 time: 522 ms
S' Lateral: 4.05 cm
Single Plane A2C EF: 57.7 %
Single Plane A4C EF: 47.8 %
Weight: 2571.45 [oz_av]

## 2024-06-07 LAB — GLUCOSE, CAPILLARY
Glucose-Capillary: 199 mg/dL — ABNORMAL HIGH (ref 70–99)
Glucose-Capillary: 228 mg/dL — ABNORMAL HIGH (ref 70–99)
Glucose-Capillary: 193 mg/dL — ABNORMAL HIGH (ref 70–99)
Glucose-Capillary: 232 mg/dL — ABNORMAL HIGH (ref 70–99)

## 2024-06-07 LAB — D-DIMER, QUANTITATIVE: D-Dimer, Quant: 0.77 ug{FEU}/mL — ABNORMAL HIGH (ref 0.00–0.50)

## 2024-06-07 LAB — COMPREHENSIVE METABOLIC PANEL WITH GFR
ALT: <5 U/L (ref 0–44)
AST: 13 U/L — ABNORMAL LOW (ref 15–41)
Albumin: 3.7 g/dL (ref 3.5–5.0)
Alkaline Phosphatase: 83 U/L (ref 38–126)
Anion gap: 15 (ref 5–15)
BUN: 40 mg/dL — ABNORMAL HIGH (ref 8–23)
CO2: 23 mmol/L (ref 22–32)
Calcium: 8.9 mg/dL (ref 8.9–10.3)
Chloride: 102 mmol/L (ref 98–111)
Creatinine, Ser: 3.62 mg/dL — ABNORMAL HIGH (ref 0.61–1.24)
GFR, Estimated: 16 mL/min — ABNORMAL LOW
Glucose, Bld: 213 mg/dL — ABNORMAL HIGH (ref 70–99)
Potassium: 4.2 mmol/L (ref 3.5–5.1)
Sodium: 139 mmol/L (ref 135–145)
Total Bilirubin: 0.4 mg/dL (ref 0.0–1.2)
Total Protein: 6.4 g/dL — ABNORMAL LOW (ref 6.5–8.1)

## 2024-06-07 LAB — PROTIME-INR
INR: 1.1 (ref 0.8–1.2)
Prothrombin Time: 14.4 s (ref 11.4–15.2)

## 2024-06-07 MED ORDER — POLYETHYLENE GLYCOL 3350 17 G PO PACK
17.0000 g | PACK | Freq: Every day | ORAL | Status: AC
  Administered 2024-06-07 – 2024-06-13 (×6): 17 g via ORAL
  Filled 2024-06-07 (×7): qty 1

## 2024-06-07 MED ORDER — PREDNISONE 20 MG PO TABS
40.0000 mg | ORAL_TABLET | Freq: Every day | ORAL | Status: DC
  Administered 2024-06-07 – 2024-06-11 (×5): 40 mg via ORAL
  Filled 2024-06-07 (×5): qty 2

## 2024-06-07 MED ORDER — PERFLUTREN LIPID MICROSPHERE
1.0000 mL | INTRAVENOUS | Status: AC | PRN
  Administered 2024-06-07: 5 mL via INTRAVENOUS

## 2024-06-07 MED ORDER — CITALOPRAM HYDROBROMIDE 20 MG PO TABS
20.0000 mg | ORAL_TABLET | Freq: Every day | ORAL | Status: AC
  Administered 2024-06-07 – 2024-06-13 (×7): 20 mg via ORAL
  Filled 2024-06-07 (×7): qty 1

## 2024-06-07 MED ORDER — IPRATROPIUM-ALBUTEROL 0.5-2.5 (3) MG/3ML IN SOLN
3.0000 mL | Freq: Four times a day (QID) | RESPIRATORY_TRACT | Status: DC
  Administered 2024-06-07 (×2): 3 mL via RESPIRATORY_TRACT
  Filled 2024-06-07 (×4): qty 3

## 2024-06-07 NOTE — Progress Notes (Signed)
" °  Echocardiogram 2D Echocardiogram has been performed. Definity  IV ultrasound imaging agent used on this study.  Phillip Reyes Louder 06/07/2024, 12:51 PM "

## 2024-06-07 NOTE — Progress Notes (Signed)
 " PROGRESS NOTE    Phillip Reyes  FMW:[id number] DOB: 22-Feb-1941 DOA: 06/06/2024 PCP: [facility]    Brief Narrative:   Phillip Reyes is a pleasant 84 y.o. 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  Patient was started on CPAP for room air saturation of 88% with neb treatment by EMS.  Patient also complained of lower abdominal pain radiating to back, Solu-Medrol  125 mg IV, DuoNeb, 2 g of magnesium  given by EMS.  EMS vitals 243/94, heart rate 62.  Patient stated that he had shortness of breath starting yesterday and he was not able to breathe today.  EMS was called.  He denied any fever, chills, nausea, vomiting, chest pain, palpitations, cough, hemoptysis, hematemesis or melena.    Assessment & Plan:   Principal Problem:   CHF (congestive heart failure) (HCC) Active Problems:   Acute respiratory failure with hypoxia (HCC)   COPD with acute exacerbation (HCC)   Cardiac defibrillator in situ   Insulin  dependent type 2 diabetes mellitus, controlled (HCC)   HTN (hypertension)   H/O: stroke   Prostate cancer (HCC)   Anemia of chronic kidney failure, stage 4 (severe) (HCC)  # HFrEF with acute exacerbation Presenting with LE edema and dyspnea, cxr with small effusions and pulm edema, bnp elevated to 10k. Improving with diuresis. - continue diuresis lasix  40 IV bid for home 40 daily - f/u tte - continue coreg   # COPD with acute exacerbation Wheezing on exam, could be cardiac, prudent to treat for concurrent copd. Nothing focal on cxr. Covid/flu/rsv neg - continue steroids  # Acute hypoxic respiratory failure Required bipap on arrival, now breathing comfortably on 3 liters, satting normally - wean [place] o2 as able - f/u dimer, consider PE eval if fails to continue to respond to above treatments  # CKD 4/5 Baseline cr around 15, is stably there - monitor closely  while diuresing  # MDD - home celexa   # T2DM Glucose appropriate - SSI  # Hx CVA with residual lef-sided weakness - cont home asa/plavix   # Neuropathy - home gabapentin   # HTN Bp elevated - home lisinopril   # BPH # History prostate cancer - home flomax   # Debility - PT consult    DVT prophylaxis: lovenox  Code Status: full Family Communication: no answer when daughter telephoned 1/31 (reports wife is on hospice and is an invalid)  Level of care: Progressive Status is: Inpatient Remains inpatient appropriate because: severity of illness    Consultants:  none  Procedures: none  Antimicrobials:  none    Subjective: Interval improvement in dyspnea. No cough, no pain  Objective: Vitals:   06/06/24 2319 06/07/24 0413 06/07/24 0448 06/07/24 0922  BP: (!) 170/63 (!) 160/58  136/61  Pulse: 64 63  66  Resp: 19 18    Temp: (!) 97.5 F (36.4 C) (!) 97.5 F (36.4 C)  98 F (36.7 C)  TempSrc:    Oral  SpO2: 100% 100%  98%  Weight:   72.9 kg   Height:        Intake/Output Summary (Last 24 hours) at 06/07/2024 1006 Last data filed at 06/07/2024 0600 Gross per 24 hour  Intake 240 ml  Output 2075 ml  Net -1835 ml   Filed Weights   06/06/24 0714 06/07/24 0448  Weight: 64.9 kg 72.9 kg    Examination:  General exam:  Appears calm and comfortable  Respiratory system: faint exp wheeze, normal wob, no tachypnea Cardiovascular system: S1 & S2 heard, RRR.   Gastrointestinal system: Abdomen is nondistended, soft and nontender.   Central nervous system: Alert and oriented. Decreased strength on left Extremities: warm, no edema Skin: No rashes, lesions or ulcers Psychiatry: Judgement and insight appear normal. Mood & affect appropriate.     Data Reviewed: I have personally reviewed following labs and imaging studies  CBC: Recent Labs  Lab 06/06/24 0713 06/07/24 0510  WBC 10.8* 14.1*  NEUTROABS 6.3  --   HGB 11.5* 11.0*  HCT 36.6* 33.5*  MCV 96.3  94.4  PLT 281 248   Basic Metabolic Panel: Recent Labs  Lab 06/06/24 0713 06/07/24 0510  NA 135 139  K 4.4 4.2  CL 102 102  CO2 22 23  GLUCOSE 210* 213*  BUN 35* 40*  CREATININE 3.44* 3.62*  CALCIUM  8.6* 8.9   GFR: Estimated Creatinine Clearance: 15.2 mL/min (A) (by C-G formula based on SCr of 3.62 mg/dL (H)). Liver Function Tests: Recent Labs  Lab 06/06/24 0713 06/07/24 0510  AST 18 13*  ALT 6 <5  ALKPHOS 95 83  BILITOT 0.5 0.4  PROT 7.0 6.4*  ALBUMIN 4.0 3.7   No results for input(s): LIPASE, AMYLASE in the last 168 hours. No results for input(s): AMMONIA in the last 168 hours. Coagulation Profile: Recent Labs  Lab 06/07/24 0510  INR 1.1   Cardiac Enzymes: No results for input(s): CKTOTAL, CKMB, CKMBINDEX, TROPONINI in the last 168 hours. BNP (last 3 results) Recent Labs    06/06/24 0713  PROBNP 10,471.0*   HbA1C: Recent Labs    06/06/24 0713  HGBA1C 6.0*   CBG: Recent Labs  Lab 06/06/24 1233 06/06/24 1625 06/06/24 2159 06/07/24 0918  GLUCAP 267* 279* 196* 199*   Lipid Profile: No results for input(s): CHOL, HDL, LDLCALC, TRIG, CHOLHDL, LDLDIRECT in the last 72 hours. Thyroid Function Tests: No results for input(s): TSH, T4TOTAL, FREET4, T3FREE, THYROIDAB in the last 72 hours. Anemia Panel: No results for input(s): VITAMINB12, FOLATE, FERRITIN, TIBC, IRON, RETICCTPCT in the last 72 hours. Urine analysis:    Component Value Date/Time   COLORURINE STRAW (A) 10/28/2021 0635   APPEARANCEUR CLEAR (A) 10/28/2021 0635   APPEARANCEUR Clear 02/17/2013 0013   LABSPEC 1.006 10/28/2021 0635   LABSPEC 1.029 02/17/2013 0013   PHURINE 5.0 10/28/2021 0635   GLUCOSEU NEGATIVE 10/28/2021 0635   GLUCOSEU >=500 02/17/2013 0013   HGBUR NEGATIVE 10/28/2021 0635   BILIRUBINUR NEGATIVE 10/28/2021 0635   BILIRUBINUR Negative 02/17/2013 0013   KETONESUR NEGATIVE 10/28/2021 0635   PROTEINUR NEGATIVE 10/28/2021  0635   NITRITE NEGATIVE 10/28/2021 0635   LEUKOCYTESUR NEGATIVE 10/28/2021 0635   LEUKOCYTESUR Negative 02/17/2013 0013   Sepsis Labs: @LABRCNTIP (procalcitonin:4,lacticidven:4)  ) Recent Results (from the past 240 hours)  Resp panel by RT-PCR (RSV, Flu A&B, Covid) Anterior Nasal Swab     Status: None   Collection Time: 06/06/24  9:36 AM   Specimen: Anterior Nasal Swab  Result Value Ref Range Status   SARS Coronavirus 2 by RT PCR NEGATIVE NEGATIVE Final    Comment: (NOTE) SARS-CoV-2 target nucleic acids are NOT DETECTED.  The SARS-CoV-2 RNA is generally detectable in upper respiratory specimens during the acute phase of infection. The lowest concentration of SARS-CoV-2 viral copies this assay can detect is 138 copies/mL. A negative result does not preclude SARS-Cov-2 infection and should not be used as the sole basis for treatment or other  patient management decisions. A negative result may occur with  improper specimen collection/handling, submission of specimen other than nasopharyngeal swab, presence of viral mutation(s) within the areas targeted by this assay, and inadequate number of viral copies(<138 copies/mL). A negative result must be combined with clinical observations, patient history, and epidemiological information. The expected result is Negative.  Fact Sheet for Patients:  bloggercourse.com  Fact Sheet for Healthcare Providers:  seriousbroker.it  This test is no t yet approved or cleared by the [place]  FDA and  has been authorized for detection and/or diagnosis of SARS-CoV-2 by FDA under an Emergency Use Authorization (EUA). This EUA will remain  in effect (meaning this test can be used) for the duration of the COVID-19 declaration under Section 564(b)(1) of the Act, 21 U.S.C.section 360bbb-3(b)(1), unless the authorization is terminated  or revoked sooner.       Influenza A by PCR NEGATIVE NEGATIVE  Final   Influenza B by PCR NEGATIVE NEGATIVE Final    Comment: (NOTE) The Xpert Xpress SARS-CoV-2/FLU/RSV plus assay is intended as an aid in the diagnosis of influenza from Nasopharyngeal swab specimens and should not be used as a sole basis for treatment. Nasal washings and aspirates are unacceptable for Xpert Xpress SARS-CoV-2/FLU/RSV testing.  Fact Sheet for Patients: bloggercourse.com  Fact Sheet for Healthcare Providers: seriousbroker.it  This test is not yet approved or cleared by the [place]  FDA and has been authorized for detection and/or diagnosis of SARS-CoV-2 by FDA under an Emergency Use Authorization (EUA). This EUA will remain in effect (meaning this test can be used) for the duration of the COVID-19 declaration under Section 564(b)(1) of the Act, 21 U.S.C. section 360bbb-3(b)(1), unless the authorization is terminated or revoked.     Resp Syncytial Virus by PCR NEGATIVE NEGATIVE Final    Comment: (NOTE) Fact Sheet for Patients: bloggercourse.com  Fact Sheet for Healthcare Providers: seriousbroker.it  This test is not yet approved or cleared by the [place]  FDA and has been authorized for detection and/or diagnosis of SARS-CoV-2 by FDA under an Emergency Use Authorization (EUA). This EUA will remain in effect (meaning this test can be used) for the duration of the COVID-19 declaration under Section 564(b)(1) of the Act, 21 U.S.C. section 360bbb-3(b)(1), unless the authorization is terminated or revoked.  Performed at [facility], [address], [place], [place] [address]          Radiology Studies: DG Chest Portable 1 View Result Date: 06/06/2024 EXAM: 2 VIEW(S) XRAY OF THE CHEST 06/06/2024 07:33:00 AM COMPARISON: AP and lateral chest 10/29/2023. CLINICAL HISTORY: SOB Shortness of breath. FINDINGS: LUNGS AND PLEURA: Small pleural  effusions are noted new in the interval. There is increased perihilar vascular congestion and mild generalized interstitial edema superimposed on coarse chronic changes of both lungs. No pneumothorax. HEART AND MEDIASTINUM: Left chest dual lead pacing system/aid and wire insertions are stable. There is mild cardiomegaly. Mediastinal configuration is stable. There is aortic atherosclerosis. BONES AND SOFT TISSUES: Degenerative change and mild dextroscoliosis of the thoracic spine and osteopenia. IMPRESSION: 1. Findings consistent with mild congestive heart failure/pulmonary edema. 2. Small pleural effusions, new in the interval. 3. Mild cardiomegaly. Electronically signed by: Francis Quam MD 06/06/2024 07:38 AM EST RP Workstation: [id number]        Scheduled Meds:  aspirin  EC  81 mg Oral Daily   atorvastatin   40 mg Oral QHS   carvedilol   6.25 mg Oral BID WC   enoxaparin  (LOVENOX ) injection  30 mg Subcutaneous Q24H  ferrous sulfate   325 mg Oral Q48H   furosemide   40 mg Intravenous BID   gabapentin   300 mg Oral QHS   insulin  aspart  0-5 Units Subcutaneous QHS   insulin  aspart  0-9 Units Subcutaneous TID WC   insulin  glargine-yfgn  5 Units Subcutaneous QHS   lisinopril   20 mg Oral Daily   pantoprazole  (PROTONIX ) IV  40 mg Intravenous Q12H   tamsulosin   0.8 mg Oral QHS   traZODone   50 mg Oral QHS   Continuous Infusions:   LOS: 1 day     Devaughn KATHEE Ban, MD [facility]   If 7PM-7AM, please contact night-coverage [email or web address] Password [email or web address] 06/07/2024, 10:06 AM     "

## 2024-06-07 NOTE — TOC Progression Note (Addendum)
 Transition of Care [facility]) - Progression Note    Patient Details  Name: Phillip Reyes MRN: [id number] Date of Birth: [birth year]  [facility] CM/SW Contact  Racheal LITTIE Schimke, RN Phone Number: 06/07/2024, 1:43 PM  Clinical Narrative: CMRN attempted to call patient's room to discuss SNF recommendation, no answer, called Daughter who says that patient would not like to go to SNF, due to taking care of bed bound wife. However she would prefer that I speak to him and he can/ able to decide.  4:20 PM- Daughter called back to say that she prefers that patient go home with [facility], to avoid being separated from wife.                   Expected Discharge Plan and Services                                               Social Drivers of Health (SDOH) Interventions SDOH Screenings   Food Insecurity: Food Insecurity Present (06/07/2024)  Housing: Low Risk (04/18/2022)  Transportation Needs: Unmet Transportation Needs (06/07/2024)  Utilities: Not At Risk (06/07/2024)  Tobacco Use: Medium Risk (06/06/2024)    Readmission Risk Interventions    04/17/2022    1:21 PM  Readmission Risk Prevention Plan  Medication Review (RN Care Manager) Complete  PCP or Specialist appointment within 3-5 days of discharge Complete  SW Recovery Care/Counseling Consult Complete  Palliative Care Screening Not Applicable  Skilled Nursing Facility Complete

## 2024-06-07 NOTE — Plan of Care (Signed)
" °  Problem: Education: Goal: Ability to describe self-care measures that may prevent or decrease complications (Diabetes Survival Skills Education) will improve Outcome: Progressing Goal: Individualized Educational Video(s) Outcome: Progressing   Problem: Coping: Goal: Ability to adjust to condition or change in health will improve Outcome: Progressing   Problem: Fluid Volume: Goal: Ability to maintain a balanced intake and output will improve Outcome: Progressing   Problem: Health Behavior/Discharge Planning: Goal: Ability to identify and utilize available resources and services will improve Outcome: Progressing Goal: Ability to manage health-related needs will improve Outcome: Progressing   Problem: Health Behavior/Discharge Planning: Goal: Ability to identify and utilize available resources and services will improve Outcome: Progressing Goal: Ability to manage health-related needs will improve Outcome: Progressing   Problem: Metabolic: Goal: Ability to maintain appropriate glucose levels will improve Outcome: Progressing   Problem: Nutritional: Goal: Maintenance of adequate nutrition will improve Outcome: Progressing Goal: Progress toward achieving an optimal weight will improve Outcome: Progressing   Problem: Skin Integrity: Goal: Risk for impaired skin integrity will decrease Outcome: Progressing   Problem: Tissue Perfusion: Goal: Adequacy of tissue perfusion will improve Outcome: Progressing   Problem: Education: Goal: Knowledge of General Education information will improve Description: Including pain rating scale, medication(s)/side effects and non-pharmacologic comfort measures Outcome: Progressing   Problem: Health Behavior/Discharge Planning: Goal: Ability to manage health-related needs will improve Outcome: Progressing   Problem: Clinical Measurements: Goal: Ability to maintain clinical measurements within normal limits will improve Outcome:  Progressing Goal: Will remain free from infection Outcome: Progressing Goal: Diagnostic test results will improve Outcome: Progressing Goal: Respiratory complications will improve Outcome: Progressing Goal: Cardiovascular complication will be avoided Outcome: Progressing   Problem: Activity: Goal: Risk for activity intolerance will decrease Outcome: Progressing   Problem: Nutrition: Goal: Adequate nutrition will be maintained Outcome: Progressing   Problem: Coping: Goal: Level of anxiety will decrease Outcome: Progressing   Problem: Elimination: Goal: Will not experience complications related to bowel motility Outcome: Progressing   Problem: Elimination: Goal: Will not experience complications related to bowel motility Outcome: Progressing Goal: Will not experience complications related to urinary retention Outcome: Progressing   Problem: Pain Managment: Goal: General experience of comfort will improve and/or be controlled Outcome: Progressing   Problem: Pain Managment: Goal: General experience of comfort will improve and/or be controlled Outcome: Progressing   Problem: Safety: Goal: Ability to remain free from injury will improve Outcome: Progressing   Problem: Skin Integrity: Goal: Risk for impaired skin integrity will decrease Outcome: Progressing   Problem: Education: Goal: Ability to demonstrate management of disease process will improve Outcome: Progressing Goal: Ability to verbalize understanding of medication therapies will improve Outcome: Progressing Goal: Individualized Educational Video(s) Outcome: Progressing   Problem: Activity: Goal: Capacity to carry out activities will improve Outcome: Progressing   Problem: Cardiac: Goal: Ability to achieve and maintain adequate cardiopulmonary perfusion will improve Outcome: Progressing   Problem: Education: Goal: Knowledge of disease or condition will improve Outcome: Progressing Goal: Knowledge  of the prescribed therapeutic regimen will improve Outcome: Progressing Goal: Individualized Educational Video(s) Outcome: Progressing   Problem: Activity: Goal: Ability to tolerate increased activity will improve Outcome: Progressing Goal: Will verbalize the importance of balancing activity with adequate rest periods Outcome: Progressing   Problem: Respiratory: Goal: Ability to maintain a clear airway will improve Outcome: Progressing Goal: Levels of oxygenation will improve Outcome: Progressing Goal: Ability to maintain adequate ventilation will improve Outcome: Progressing   "

## 2024-06-08 LAB — GLUCOSE, CAPILLARY
Glucose-Capillary: 218 mg/dL — ABNORMAL HIGH (ref 70–99)
Glucose-Capillary: 340 mg/dL — ABNORMAL HIGH (ref 70–99)
Glucose-Capillary: 185 mg/dL — ABNORMAL HIGH (ref 70–99)
Glucose-Capillary: 268 mg/dL — ABNORMAL HIGH (ref 70–99)

## 2024-06-08 LAB — BASIC METABOLIC PANEL WITH GFR
Anion gap: 13 (ref 5–15)
BUN: 56 mg/dL — ABNORMAL HIGH (ref 8–23)
CO2: 23 mmol/L (ref 22–32)
Calcium: 8.3 mg/dL — ABNORMAL LOW (ref 8.9–10.3)
Chloride: 100 mmol/L (ref 98–111)
Creatinine, Ser: 4.33 mg/dL — ABNORMAL HIGH (ref 0.61–1.24)
GFR, Estimated: 13 mL/min — ABNORMAL LOW
Glucose, Bld: 234 mg/dL — ABNORMAL HIGH (ref 70–99)
Potassium: 4.7 mmol/L (ref 3.5–5.1)
Sodium: 136 mmol/L (ref 135–145)

## 2024-06-08 MED ORDER — FUROSEMIDE 10 MG/ML IJ SOLN
40.0000 mg | Freq: Two times a day (BID) | INTRAMUSCULAR | Status: DC
  Administered 2024-06-08: 40 mg via INTRAVENOUS
  Filled 2024-06-08: qty 4

## 2024-06-08 MED ORDER — INSULIN ASPART 100 UNIT/ML IJ SOLN
0.0000 [IU] | Freq: Every day | INTRAMUSCULAR | Status: AC
  Administered 2024-06-08: 3 [IU] via SUBCUTANEOUS
  Administered 2024-06-09 – 2024-06-10 (×2): 4 [IU] via SUBCUTANEOUS
  Administered 2024-06-11: 5 [IU] via SUBCUTANEOUS
  Administered 2024-06-12: 4 [IU] via SUBCUTANEOUS
  Administered 2024-06-13: 2 [IU] via SUBCUTANEOUS
  Filled 2024-06-08: qty 3
  Filled 2024-06-08: qty 5
  Filled 2024-06-08 (×2): qty 4
  Filled 2024-06-08: qty 2
  Filled 2024-06-08: qty 4

## 2024-06-08 MED ORDER — IPRATROPIUM-ALBUTEROL 0.5-2.5 (3) MG/3ML IN SOLN
3.0000 mL | Freq: Four times a day (QID) | RESPIRATORY_TRACT | Status: DC | PRN
  Administered 2024-06-08: 3 mL via RESPIRATORY_TRACT
  Filled 2024-06-08: qty 3

## 2024-06-08 MED ORDER — PANTOPRAZOLE SODIUM 40 MG PO TBEC
40.0000 mg | DELAYED_RELEASE_TABLET | Freq: Two times a day (BID) | ORAL | Status: AC
  Administered 2024-06-08 – 2024-06-13 (×11): 40 mg via ORAL
  Filled 2024-06-08 (×11): qty 1

## 2024-06-08 MED ORDER — INSULIN ASPART 100 UNIT/ML IJ SOLN
0.0000 [IU] | Freq: Three times a day (TID) | INTRAMUSCULAR | Status: AC
  Administered 2024-06-08 – 2024-06-09 (×2): 3 [IU] via SUBCUTANEOUS
  Administered 2024-06-09: 5 [IU] via SUBCUTANEOUS
  Administered 2024-06-09: 8 [IU] via SUBCUTANEOUS
  Administered 2024-06-10: 3 [IU] via SUBCUTANEOUS
  Administered 2024-06-10 – 2024-06-11 (×3): 11 [IU] via SUBCUTANEOUS
  Administered 2024-06-11: 3 [IU] via SUBCUTANEOUS
  Administered 2024-06-12: 8 [IU] via SUBCUTANEOUS
  Administered 2024-06-12 (×2): 11 [IU] via SUBCUTANEOUS
  Administered 2024-06-13: 2 [IU] via SUBCUTANEOUS
  Administered 2024-06-13: 3 [IU] via SUBCUTANEOUS
  Administered 2024-06-13: 2 [IU] via SUBCUTANEOUS
  Filled 2024-06-08: qty 3
  Filled 2024-06-08: qty 11
  Filled 2024-06-08: qty 3
  Filled 2024-06-08 (×3): qty 11
  Filled 2024-06-08: qty 3
  Filled 2024-06-08: qty 5
  Filled 2024-06-08 (×3): qty 2
  Filled 2024-06-08 (×2): qty 8
  Filled 2024-06-08: qty 11
  Filled 2024-06-08: qty 3

## 2024-06-08 MED ORDER — CARVEDILOL 6.25 MG PO TABS
6.2500 mg | ORAL_TABLET | Freq: Two times a day (BID) | ORAL | Status: AC
  Administered 2024-06-08 – 2024-06-13 (×12): 6.25 mg via ORAL
  Filled 2024-06-08 (×12): qty 1

## 2024-06-08 NOTE — Plan of Care (Signed)

## 2024-06-08 NOTE — Progress Notes (Signed)
 Physical Therapy Treatment Patient Details Name: Phillip Reyes MRN: [id number] DOB: 1941/02/23 Today's Date: 06/08/2024   History of Present Illness Phillip Reyes is a pleasant 84 y.o. 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    PT Comments  Patient seen for PT session focused on bed mobility and short OOB mobility. Tolerated session fair with mild signs of exertion. Vitals remained stable during activity on 2 L/min. Main limiting factors today were fatigue. Interventions aimed at improving LE endurance. Patient shows good potential to make progress with continued acute level rehab. Patient continues to demonstrate mild to moderate activity restrictions and poor tolerance for progressive mobility. Continued skilled PT recommended to progress toward functional goals and support discharge readiness. Pt making good progress toward goals, will continue to follow POC. Discharge recommendation remains appropriate     If plan is discharge home, recommend the following: A lot of help with walking and/or transfers;A lot of help with bathing/dressing/bathroom   Can travel by private vehicle     No  Equipment Recommendations  None recommended by PT    Recommendations for Other Services       Precautions / Restrictions Precautions Precautions: Fall Recall of Precautions/Restrictions: Intact Restrictions Weight Bearing Restrictions Per Provider Order: No     Mobility  Bed Mobility Overal bed mobility: Needs Assistance Bed Mobility: Supine to Sit, Sit to Supine     Supine to sit: Mod assist, HOB elevated, Used rails Sit to supine: Mod assist        Transfers Overall transfer level: Needs assistance Equipment used: 1 person hand held assist, Rolling walker (2 wheels) Transfers: Sit to/from Stand Sit to Stand: Min assist           General transfer comment: patient able  to stand and take a couple of side steps. sit to stand x2 for positioning in chair    Ambulation/Gait Ambulation/Gait assistance: Min assist Gait Distance (Feet): 5 Feet Assistive device: Rolling walker (2 wheels) Gait Pattern/deviations: Step-to pattern, Shuffle, Trunk flexed       General Gait Details: few step forward, backwards and marching at bedside; 2 L/min. (PT arrived to room pt. 87% on RA oxygen placed)   Stairs             Wheelchair Mobility     Tilt Bed    Modified Rankin (Stroke Patients Only)       Balance Overall balance assessment: Needs assistance Sitting-balance support: Feet supported Sitting balance-Leahy Scale: Good     Standing balance support: Bilateral upper extremity supported, During functional activity Standing balance-Leahy Scale: Fair                              Hotel Manager: Impaired Factors Affecting Communication: Hearing impaired;Reduced clarity of speech  Cognition Arousal: Alert Behavior During Therapy: WFL for tasks assessed/performed   PT - Cognitive impairments: No apparent impairments                         Following commands: Intact      Cueing Cueing Techniques: Verbal cues  Exercises      General Comments        Pertinent Vitals/Pain Pain Assessment Pain Assessment: No/denies pain    Home Living  Prior Function            PT Goals (current goals can now be found in the care plan section) Acute Rehab PT Goals Patient Stated Goal: improve PT Goal Formulation: With patient Time For Goal Achievement: 06/20/24 Potential to Achieve Goals: Fair Progress towards PT goals: Progressing toward goals    Frequency    Min 2X/week      PT Plan      Co-evaluation              AM-PAC PT 6 Clicks Mobility   Outcome Measure  Help needed turning from your back to your side while in a flat bed without  using bedrails?: A Little Help needed moving from lying on your back to sitting on the side of a flat bed without using bedrails?: A Little Help needed moving to and from a bed to a chair (including a wheelchair)?: A Little Help needed standing up from a chair using your arms (e.g., wheelchair or bedside chair)?: A Little Help needed to walk in hospital room?: A Little Help needed climbing 3-5 steps with a railing? : A Lot 6 Click Score: 17    End of Session Equipment Utilized During Treatment: Oxygen Activity Tolerance: Patient limited by fatigue;Other (comment) Patient left: in bed;with call bell/phone within reach;with nursing/sitter in room Nurse Communication: Mobility status PT Visit Diagnosis: Other abnormalities of gait and mobility (R26.89);Muscle weakness (generalized) (M62.81);Difficulty in walking, not elsewhere classified (R26.2);Unsteadiness on feet (R26.81)     Time: 8578-8555 PT Time Calculation (min) (ACUTE ONLY): 23 min  Charges:    $Therapeutic Activity: 8-22 mins PT General Charges $$ ACUTE PT VISIT: 1 Visit                     Sherlean Lesches DPT, PT     Sherlean A Desyre Calma 06/08/2024, 2:54 PM

## 2024-06-08 NOTE — Progress Notes (Signed)
 PHARMACIST - PHYSICIAN COMMUNICATION  DR:   Kandis  CONCERNING: IV to Oral Route Change Policy  RECOMMENDATION: This patient is receiving pantoprazole  by the intravenous route.  Based on criteria approved by the Pharmacy and Therapeutics Committee, the intravenous medication(s) is/are being converted to the equivalent oral dose form(s).   DESCRIPTION: These criteria include: The patient is eating (either orally or via tube) and/or has been taking other orally administered medications for a least 24 hours The patient has no evidence of active gastrointestinal bleeding or impaired GI absorption (gastrectomy, short bowel, patient on TNA or NPO).  If you have questions about this conversion, please contact the Pharmacy Department  []   [phone] )  [facility] [x]   [phone] )  [facility] []   [phone] )  [facility] []   [phone] )  [facility] []   [phone] )  [facility], [facility] 06/08/2024 8:18 AM

## 2024-06-09 ENCOUNTER — Inpatient Hospital Stay

## 2024-06-09 LAB — BASIC METABOLIC PANEL WITH GFR
Anion gap: 10 (ref 5–15)
Anion gap: 12 (ref 5–15)
BUN: 73 mg/dL — ABNORMAL HIGH (ref 8–23)
BUN: 74 mg/dL — ABNORMAL HIGH (ref 8–23)
CO2: 25 mmol/L (ref 22–32)
CO2: 23 mmol/L (ref 22–32)
Calcium: 7.9 mg/dL — ABNORMAL LOW (ref 8.9–10.3)
Calcium: 8 mg/dL — ABNORMAL LOW (ref 8.9–10.3)
Chloride: 100 mmol/L (ref 98–111)
Chloride: 100 mmol/L (ref 98–111)
Creatinine, Ser: 4.66 mg/dL — ABNORMAL HIGH (ref 0.61–1.24)
Creatinine, Ser: 4.57 mg/dL — ABNORMAL HIGH (ref 0.61–1.24)
GFR, Estimated: 12 mL/min — ABNORMAL LOW
GFR, Estimated: 12 mL/min — ABNORMAL LOW
Glucose, Bld: 180 mg/dL — ABNORMAL HIGH (ref 70–99)
Glucose, Bld: 301 mg/dL — ABNORMAL HIGH (ref 70–99)
Potassium: 5.1 mmol/L (ref 3.5–5.1)
Potassium: 5.6 mmol/L — ABNORMAL HIGH (ref 3.5–5.1)
Sodium: 136 mmol/L (ref 135–145)
Sodium: 135 mmol/L (ref 135–145)

## 2024-06-09 LAB — GLUCOSE, CAPILLARY
Glucose-Capillary: 162 mg/dL — ABNORMAL HIGH (ref 70–99)
Glucose-Capillary: 221 mg/dL — ABNORMAL HIGH (ref 70–99)
Glucose-Capillary: 295 mg/dL — ABNORMAL HIGH (ref 70–99)
Glucose-Capillary: 303 mg/dL — ABNORMAL HIGH (ref 70–99)
Glucose-Capillary: 331 mg/dL — ABNORMAL HIGH (ref 70–99)

## 2024-06-09 MED ORDER — SODIUM ZIRCONIUM CYCLOSILICATE 10 G PO PACK
10.0000 g | PACK | Freq: Once | ORAL | Status: AC
  Administered 2024-06-09: 10 g via ORAL
  Filled 2024-06-09: qty 1

## 2024-06-09 MED ORDER — INSULIN ASPART 100 UNIT/ML IV SOLN
5.0000 [IU] | Freq: Once | INTRAVENOUS | Status: AC
  Administered 2024-06-09: 5 [IU] via INTRAVENOUS
  Filled 2024-06-09: qty 5

## 2024-06-09 MED ORDER — ENSURE PLUS HIGH PROTEIN PO LIQD: 237.0000 mL | Freq: Two times a day (BID) | ORAL | Status: DC

## 2024-06-09 NOTE — Plan of Care (Signed)

## 2024-06-09 NOTE — Consult Note (Signed)
 "  Cardiology Consultation   Patient ID: Phillip Reyes MRN: [id number]; DOB: 05-Dec-1940  Admit date: 06/06/2024 Date of Consult: 06/09/2024  PCP:  [facility] Providers Cardiologist:  None        Patient Profile: Phillip Reyes is a 84 y.o. male with a hx of CHF, bradycardia s/p ICD, CAD, COPD, hypertension, hyperlipidemia, type 2 diabetes, GERD, and CKD who is being seen 06/09/2024 for the evaluation of acute on chronic CHF at the request of Dr. Kandis.  History of Present Illness: [facility]  Limited records are available for review.  Notes indicate coronary artery disease with prior PCI in 07/2018 and 08/2018 with ICD placement 12/2019. History of CHF dating back to at least 2021 with EF of 35% at that time. Most recent echo 04/2022 with EF improved to 55-60%.   Patient initially presented to the ED 06/06/2024 with concern for worsening shortness of breath over the past week or so.  He endorses dyspnea both with exertion and at rest.  He also reports lower extremity swelling, intermittent feelings of heart racing, and intermittent chest discomfort.  In the ED, patient was found to be hypertensive at 203/84 and tachypneic at 24.  He was started on CPAP for room air saturation of 88% with nebulizer treatment by EMS.  Pertinent labs include WBC 10.8, Hgb 11.5, BUN 35, creatinine 3.44, proBNP 10,471.  ABG showed pCO2 of 70.  Troponin minimally elevated at 34.  EKG without acute ischemic changes.  Chest x-ray revealed pulmonary edema with small pleural effusions bilaterally.  Patient was given IV Lasix  80 mg x 1 in the ED and nebulizer treatment.  Patient has since been IV diuresed for presumed acute CHF.  However, renal function has continued to worsen.  Echo this admission revealed mildly reduced LV systolic function with EF 40 to 45%, mild to moderate aortic stenosis, global hypokinesis, normal diastolic parameters,  low normal RV systolic function, and mild AI.  Cardiology was asked to consult for further assistance with management of acute on chronic CHF.  At time of cardiology consult, patient feels that dyspnea has improved slightly since admission but overall remains very short of breath.  Denies current chest pain.  Past Medical History:  Diagnosis Date   AICD (automatic cardioverter/defibrillator) present    Arthritis    Cancer (HCC)    COPD (chronic obstructive pulmonary disease) (HCC)    Diabetes mellitus without complication (HCC)    Edentulous    Full dentures    Grade II diastolic dysfunction    Hypertension    Presence of permanent cardiac pacemaker    Renal disorder    CKD -4   Stroke (HCC) 2019   No residual deficits    Past Surgical History:  Procedure Laterality Date   APPENDECTOMY     CATARACT EXTRACTION W/PHACO Left 08/02/2023   Procedure: CATARACT EXTRACTION PHACO AND INTRAOCULAR LENS PLACEMENT (IOC) LEFT DIABETIC MALYUGIN VISION BLUE OMIDRIA  20.02, 01:44.7;  Surgeon: Enola Feliciano Hugger, MD;  Location: [facility];  Service: Ophthalmology;  Laterality: Left;   INTRAMEDULLARY (IM) NAIL INTERTROCHANTERIC Right 09/15/2021   Procedure: INTRAMEDULLARY (IM) NAIL INTERTROCHANTRIC;  Surgeon: Kathlynn Sharper, MD;  Location: [facility];  Service: Orthopedics;  Laterality: Right;   THROAT SURGERY         Scheduled Meds:  aspirin  EC  81 mg Oral Daily   atorvastatin   40 mg Oral QHS   carvedilol   6.25 mg Oral BID WC   citalopram   20 mg Oral Daily   enoxaparin  (LOVENOX ) injection  30 mg Subcutaneous Q24H   ferrous sulfate   325 mg Oral Q48H   gabapentin   300 mg Oral QHS   insulin  aspart  0-15 Units Subcutaneous TID WC   insulin  aspart  0-5 Units Subcutaneous QHS   insulin  glargine-yfgn  5 Units Subcutaneous QHS   pantoprazole   40 mg Oral BID   polyethylene glycol  17 g Oral Daily   predniSONE   40 mg Oral Q breakfast   tamsulosin   0.8 mg Oral QHS   traZODone   50 mg Oral QHS    Continuous Infusions:  PRN Meds: acetaminophen  **OR** acetaminophen , ipratropium-albuterol , ondansetron  **OR** ondansetron  (ZOFRAN ) IV  Allergies:   Allergies[1]  Social History:   Social History   Socioeconomic History   Marital status: Married    Spouse name: Not on file   Number of children: Not on file   Years of education: Not on file   Highest education level: Not on file  Occupational History   Not on file  Tobacco Use   Smoking status: Former    Current packs/day: 0.00    Average packs/day: 0.5 packs/day for 61.0 years (30.5 ttl pk-yrs)    Types: Cigarettes    Quit date: 05/08/2022    Years since quitting: 2.0   Smokeless tobacco: Never  Vaping Use   Vaping status: Never Used  Substance and Sexual Activity   Alcohol use: No   Drug use: No   Sexual activity: Not on file  Other Topics Concern   Not on file  Social History Narrative   Not on file   Social Drivers of Health   Tobacco Use: Medium Risk (06/06/2024)   Patient History    Smoking Tobacco Use: Former    Smokeless Tobacco Use: Never    Passive Exposure: Not on Actuary Strain: Not on file  Food Insecurity: Food Insecurity Present (06/07/2024)   Epic    Worried About Programme Researcher, Broadcasting/film/video in the Last Year: Sometimes true    Ran Out of Food in the Last Year: Sometimes true  Transportation Needs: Unmet Transportation Needs (06/07/2024)   Epic    Lack of Transportation (Medical): Yes    Lack of Transportation (Non-Medical): Yes  Physical Activity: Not on file  Stress: Not on file  Social Connections: Unknown (06/08/2024)   Social Connection and Isolation Panel    Frequency of Communication with Friends and Family: Three times a week    Frequency of Social Gatherings with Friends and Family: Twice a week    Attends Religious Services: Not on Marketing Executive or Organizations: Not on file    Attends Banker Meetings: Not on file    Marital Status: Not on file   Intimate Partner Violence: Not At Risk (06/07/2024)   Epic    Fear of Current or Ex-Partner: No    Emotionally Abused: No    Physically Abused: No    Sexually Abused: No  Depression (PHQ2-9): Not on file  Alcohol Screen: Not on file  Housing: Low Risk (06/08/2024)   Epic    Unable to Pay for Housing in the Last Year: No    Number of Times Moved in the Last Year: 0    Homeless in the Last Year: No  Utilities: Not At Risk (06/07/2024)   Epic    Threatened with loss of utilities: No  Health Literacy: Not on file    Family History:    Family History  Problem Relation Age of Onset   Congestive Heart Failure Father    Other Mother        unknown medical history     ROS:  Please see the history of present illness.   All other ROS reviewed and negative.     Physical Exam/Data: Vitals:   06/09/24 0152 06/09/24 0355 06/09/24 0500 06/09/24 0807  BP: (!) 126/53 (!) 134/55  131/60  Pulse: (!) 59 (!) 59  (!) 59  Resp: 17 20  20   Temp: 97.7 F (36.5 C) 98.2 F (36.8 C)  97.7 F (36.5 C)  TempSrc:    Oral  SpO2: 96% 97%  100%  Weight:   70.5 kg   Height:        Intake/Output Summary (Last 24 hours) at 06/09/2024 0814 Last data filed at 06/09/2024 0010 Gross per 24 hour  Intake 720 ml  Output 1750 ml  Net -1030 ml      06/09/2024    5:00 AM 06/08/2024    4:58 AM 06/07/2024    4:48 AM  Last 3 Weights  Weight (lbs) 155 lb 6.8 oz 159 lb 13.3 oz 160 lb 11.5 oz  Weight (kg) 70.5 kg 72.5 kg 72.9 kg     Body mass index is 23.29 kg/m.  General:  Well nourished, well developed, in no acute distress HEENT: normal Neck: no JVD Vascular: No carotid bruits; Distal pulses 2+ bilaterally Cardiac:  normal S1, S2; RRR; no murmur Lungs: Coarse breath sounds and wheezing throughout lung fields Abd: soft, nontender, no hepatomegaly  Ext: no edema Skin: warm and dry  Psych:  Normal affect   EKG:  The EKG was personally reviewed and demonstrates: Sinus rhythm with nonspecific IVCD and  significant baseline artifact, rate 74 bpm Telemetry:  Telemetry was personally reviewed and demonstrates: Atrial paced rhythm, 60s bpm  Relevant CV Studies:  06/07/2024 2D echo 1. Mild apical hypo.   2. Mild/Mod Aortic stenosis Valve area 1.1 cm2.   3. Left ventricular ejection fraction, by estimation, is 40 to 45%. The  left ventricle has mildly decreased function. The left ventricle  demonstrates global hypokinesis. Left ventricular diastolic parameters  were normal.   4. Right ventricular systolic function is low normal. The right  ventricular size is normal.   5. The mitral valve is normal in structure. Trivial mitral valve  regurgitation.   6. The aortic valve is calcified. There is severe thickening of the  aortic valve. Aortic valve regurgitation is mild.   Laboratory Data: High Sensitivity Troponin:  No results for input(s): TROPONINIHS in the last 720 hours.  Recent Labs  Lab 06/06/24 0713 06/06/24 0936  TRNPT 34* 32*      Chemistry Recent Labs  Lab 06/07/24 0510 06/08/24 0430 06/09/24 0417  NA 139 136 136  K 4.2 4.7 5.1  CL 102 100 100  CO2 23 23 25   GLUCOSE 213* 234* 180*  BUN 40* 56* 73*  CREATININE 3.62* 4.33* 4.66*  CALCIUM  8.9 8.3* 7.9*  GFRNONAA 16* 13* 12*  ANIONGAP 15 13 10     Recent Labs  Lab 06/06/24 0713 06/07/24 0510  PROT 7.0 6.4*  ALBUMIN 4.0 3.7  AST 18 13*  ALT 6 <5  ALKPHOS 95 83  BILITOT 0.5 0.4   Lipids No results for input(s): CHOL, TRIG, HDL, LABVLDL, LDLCALC, CHOLHDL in the last 168 hours.  Hematology Recent Labs  Lab  06/06/24 0713 06/07/24 0510  WBC 10.8* 14.1*  RBC 3.80* 3.55*  HGB 11.5* 11.0*  HCT 36.6* 33.5*  MCV 96.3 94.4  MCH 30.3 31.0  MCHC 31.4 32.8  RDW 14.2 14.1  PLT 281 248   Thyroid No results for input(s): TSH, FREET4 in the last 168 hours.  BNP Recent Labs  Lab 06/06/24 0713  PROBNP 10,471.0*    DDimer  Recent Labs  Lab 06/07/24 1619  DDIMER 0.77*    Radiology/Studies:   CT HEAD WO CONTRAST ( ) Result Date: 06/07/2024 IMPRESSION: 1. No acute intracranial abnormality. Electronically signed by: Lonni Necessary MD 06/07/2024 05:15 PM EST RP Workstation: [id number]   DG Chest Portable 1 View Result Date: 06/06/2024 IMPRESSION: 1. Findings consistent with mild congestive heart failure/pulmonary edema. 2. Small pleural effusions, new in the interval. 3. Mild cardiomegaly. Electronically signed by: Francis Quam MD 06/06/2024 07:38 AM EST RP Workstation: [id number]   Assessment and Plan:  Acute on chronic HFmrEF - History of CHF dating back to at least 2021 with EF of 35% at that time.  Most recent echo 04/2022 with EF improved at 55 to 60%. - Initially presenting 1/30 with worsening shortness of breath and lower extremity swelling, proBNP 10,471 and chest x-ray showing mild CHF/pulmonary edema with small bilateral pleural effusions - Echo this admission revealed mildly reduced LV systolic function with EF 40 to 45% - Has been IV diuresed during admission with net output of -2.2 L - Renal function progressively worsening throughout admission with initial BUN/creatinine 35/3.44, now 73/4.66 - Agree with holding further diuresis and lisinopril  given worsening renal function - Obtain chest x-ray  Acute hypoxic respiratory failure COPD exacerbation - Initially requiring CPAP, now weaned to 2 L nasal cannula - In the setting of acute on chronic CHF and COPD exacerbation - Hold diuresis - Ongoing management of COPD per IM  CKD IV/V - BUN/creatinine up significantly from baseline, worsening with diuresis - Continue to avoid nephrotoxic agents - Hold off on further IV diuresis, as above - Would likely benefit from nephrology consult  Essential hypertension - Holding PTA lisinopril  - BP reasonably well-controlled  Aortic stenosis - Mild to moderate on echo this admission - Continue to monitor as outpatient   For questions or updates, please contact  [facility] Please consult [email or web address] for contact info under      Signed, Lesley LITTIE Maffucci, PA-C  06/09/2024 8:14 AM     [1]  Allergies Allergen Reactions   Norco [Hydrocodone -Acetaminophen ] Shortness Of Breath   Hydrochlorothiazide Other (See Comments)   Metformin Diarrhea   Terazosin Other (See Comments)   "

## 2024-06-09 NOTE — Discharge Instructions (Signed)
 Food Resources  Agency Name: [facility] Address: [address], [place], [place] [address] Phone: [phone] Website: www.[email or web address] Service(s) Offered: Housing services, self-sufficiency, congregate meal program, weatherization program, Event organiser program, emergency food assistance,  housing counseling, home ownership program, wheels - to work program.  [employer] free for 60 and older at various locations from [employer], Monday-Friday:  [employer], [address] [place], [phone] -[facility], [address] [place] [phone]  -[facility], [address], [place] [phone]  -[address], [place], [phone]  Agency Name: [facility] Address: [phone] [address], [place], [place] [address] Phone: [phone] Website: www.[email or web address] Service(s) Offered: Home delivered hot, frozen, and emergency  meals. Grocery assistance program which matches  volunteers one-on-one with seniors unable to grocery shop  for themselves. Must be 60 years and older; less than 20  hours of in-home aide service, limited or no driving ability;  live alone or with someone with a disability; live in  [place].  Agency Name: [facility]) Address: [address] [place], [place] [address] Phone: [phone] Service(s) Offered: Food is served to shut-ins, homeless, elderly, and low income people in the community every Saturday (11:30 am-12:30 pm) and Sunday (12:30 pm-1:30pm). Volunteers also offer help and encouragement in seeking employment,  and spiritual guidance.  Agency Name: [facility] [employer] [facility] Address: [address] [place], [place] [address] Phone: [phone] Service(s) Offered: Child support services; child welfare services; food stamps; Medicaid; work first family assistance; and aid  with fuel,  rent, food and medicine.  Agency Name: [facility] Address: [address] [place], [place] Phone: [phone] Website: [email or web address] Services Offered: Monday 10:00am-12:00, 8:00pm-9:00pm, and Friday 10:00am-12:00.  Agency Name: [employer] [facility] [place] Address: [address], [place], [place] [address] Phone: [phone] Website: www.[email or web address] Service(s) Offered: Serves weekday meals, open from 11:30 am- 1:00 pm., and 6:30-7:30pm, Monday-Wednesday-Friday distributes food 3:30-6pm, Monday-Wednesday-Friday.  Agency Name: [facility] Address: [address], [place], [place] Phone: [phone] Website: [email or web address] Services Offered: Distributes food the 4th Saturday of the month, starting at 8:00 am  Agency Name: [facility] Address: [phone] [address] [place], [place] [address] Phone: [phone] Website: [email or web address] Service(s) Offered: Bread of life, weekly food pantry. Open Wednesdays from 10:00am-noon.  Agency Name: [facility] [employer] Address: [address] [place], [place], [place] Phone: [phone] Services Offered: Distributes food 9am-1pm, Monday-Thursday. Call for details.  Agency Name: [facility] Address: [address] [place], [place] [address] Phone: [phone] Website: [email or web address] Service(s) Offered: Games developer. Call for assistance.  Agency Name: [facility] Address: [address] [place], [place] [address] Phone: [phone] Service Offered: Emergency Food Pantry. Call for appointment.  Agency Name: [facility] Address: [address] [place], [place] [address] Phone: [phone] Website: [email or web address] Services Offered: Games developer. Call for details  Agency Name: [facility] Address: [address] [place], [place] Phone:  [phone] Website: [email or web address] Service(s) Offered: Emergency Food Pantry. Call for details.  Agency Name: Holiday representative Address: [address], [place], [place] [address] Phone: [phone] or [phone] Website: [email or web address] Service(s) Offered: Distribute food 9am-11:30 am, Tuesday-Friday, and 1-3:30pm, Monday-Friday. Food pantry Monday-Friday 1pm-3pm, fresh items, Mon.-Wed.-Fri.  Agency Name: [facility] Address: [address] [place], [place] [address] Phone: [phone] Website: www.[email or web address].org Services Offered: Distribute food Tues and Sats from 9:00am-noon.  Closed 1st Saturday of each month. Call for details  Agency Name: [facility] [employer] Address: [facility] [address], [place] [address] Phone: [phone]  Services Offered: Delivers meals every Thursday    Rent/Utility/Housing  Agency Name: [facility] Address: [address] [place], [place] [address] Phone: [phone] Email: [email or web address] Website: www.[email or web address] Service(s) Offered: Housing services, self-sufficiency, congregate meal program, weatherization program, Field seismologist program, emergency food assistance,  housing counseling, home ownership program, wheels -towork program.  Agency Name: [facility] Address: [address], [place], [place] [address] Phone: [phone] (8a-4p) [phone] (8p- 10p) Email: [email or web address] Website: [email or web address] Service(s) Offered: A program for homeless and/or needy men that includes one-on-one counseling, life skills training and job rehabilitation.  Agency Name: [employer] [facility] [place] Address: [address] [place], [place] [address] Phone: [phone] Website: www.[email or web address] Service(s) Offered: Assistance to needy in emergency with  utility bills, heating fuel, and prescriptions. Shelter for homeless 7pm-7am. August 31, 2016 15  Agency Name: [facility] [place] (Developmentally Disabled) Address: [address], [place], [place] [address] Phone: [phone] Contact Person: Cathleen Corti Email: [email or web address] Website: [email or web address] Service(s) Offered: Helps individuals with developmental disabilities move from housing that is more restrictive to homes where they  can achieve greater independence and have more  opportunities.  Agency Name: [facility] Address: [address], [place], [place] [address] Phone: [phone] Email: [email or web address] Website: www.[email or web address].org Service(s) Offered: Provides affordable housing for low-income families, elderly, and disabled individuals. Offer a wide range of  programs and services, from financial planning to afterschool and summer programs.  Agency Name: Department of Social Services Address: [address] [place], [place] [address] Phone: [phone] Service(s) Offered: Child support services; child welfare services; food stamps; Medicaid; work first family assistance; and aid with fuel,  rent, food and medicine.  Agency Name: [facility] [place] [employer]. Address: [facility] [address], [place], [place]  [address] Phone: [phone] Website: www.familyabuseservices.org Service(s) Offered: 24 hour Crisis Line: [phone]; 24 hour Emergency Shelter; Transitional Housing; Support Groups; Scientist, physiological; [employer]; Hispanic Outreach: [phone];  Visitation Center: [phone].  Agency Name: [facility] [place]. Address: [address], [place], [place] [address] Phone: [phone] Service(s) Offered: CAP Services; Home and [employer]; Individual or Group Supports; Respite Care Non-Institutional Nursing;  Residential Supports; Respite Care and Personal Care  Services; Transportation; Family and Friends Night; Recreational Activities; Three Nutritious Meals/Snacks; Consultation with Registered Dietician; Twenty-four hour Registered Nurse Access; Daily and [employer]; [place]aves; [place] for the [employer] (During Summer Months) Bingo Night (Every  Wednesday Night); Special Populations Dance Night  (Every Tuesday Night); Professional Hair Care Services.  Agency Name: [facility] Address: [address], [place], [place] [address] Phone: [phone] Contact Person: Jabier Mutton Website: [email or web address].Physicist, medical) Offered: Residential treatment facility for women; food and  clothing, educational & employment development and  transportation to work; Counsellor of financial skills;  parenting and family reunification; emotional and spiritual  support; transitional housing for program graduates.  Agency Name: [facility] Address: [address] [place], [place] [address] Phone: [phone] Email: [email or web address] Website: [email or web address].es Service(s) Offered: Public housing units for elderly, disabled, and low income people; housing choice vouchers for income eligible  applicants; shelter plus care vouchers; and Psychologist, clinical.  Agency Name: [facility] [employer] Address: [address], [place], [place] [address] Phone: [phone] Email: [email or web address] Website:  www.habitatalamance.org Service(s) Offered: Build houses for families in need of decent housing. Each adult in the family must invest 200 hours of labor on  someone else's house, work with volunteers to build their own house, attend classes on budgeting, home maintenance, yard care, and attend homeowner association meetings.  Agency Name: [facility] Address: [address] [place], [place] [address] Phone: [phone] Website:  www.rsli.org Service(s) Offered: Intermediate care facilities for intellectually delayed, Supervised Living in group homes for adults with developmental disabilities, Supervised Living for people who have dual diagnoses (MRMI), Independent Living, Supported Living, respite and a variety of CAP services, pre-vocational services, day supports, and [employer].  Agency Name: [place] [employer] Phone: [phone] Website: www.NCForeclosurePrevention.gov Service(s) Offered: Zero-interest, deferred loans to homeowners struggling to pay their mortgage. Call for more information.

## 2024-06-09 NOTE — TOC Progression Note (Signed)
 Transition of Care [facility]) - Progression Note    Patient Details  Name: Phillip Reyes MRN: [id number] Date of Birth: [birth year]  [name]) CM/SW Contact  Lauraine [name], LCSW Phone Number: 06/09/2024, 12:45 PM  Clinical Narrative:   CSW met with patient. He confirmed he does not want SNF placement but he is agreeable to home health. He prefers to use his VA benefits rather than [employer]. CSW sent out referral for PT, OT, RN. Patient does not use oxygen at home. Will follow for this potential discharge need. Patient stated his caregiver should be able to transport him home at discharge.  Expected Discharge Plan and Services                                               Social Drivers of Health (SDOH) Interventions SDOH Screenings   Food Insecurity: Food Insecurity Present (06/07/2024)  Housing: Low Risk (06/08/2024)  Transportation Needs: Unmet Transportation Needs (06/07/2024)  Utilities: Not At Risk (06/07/2024)  Social Connections: Unknown (06/08/2024)  Tobacco Use: Medium Risk (06/06/2024)    Readmission Risk Interventions    04/17/2022    1:21 PM  Readmission Risk Prevention Plan  Medication Review (RN Care Manager) Complete  PCP or Specialist appointment within 3-5 days of discharge Complete  SW Recovery Care/Counseling Consult Complete  Palliative Care Screening Not Applicable  Skilled Nursing Facility Complete

## 2024-06-10 LAB — BASIC METABOLIC PANEL WITH GFR
Anion gap: 12 (ref 5–15)
BUN: 84 mg/dL — ABNORMAL HIGH (ref 8–23)
CO2: 23 mmol/L (ref 22–32)
Calcium: 7.9 mg/dL — ABNORMAL LOW (ref 8.9–10.3)
Chloride: 100 mmol/L (ref 98–111)
Creatinine, Ser: 4.65 mg/dL — ABNORMAL HIGH (ref 0.61–1.24)
GFR, Estimated: 12 mL/min — ABNORMAL LOW
Glucose, Bld: 219 mg/dL — ABNORMAL HIGH (ref 70–99)
Potassium: 5.3 mmol/L — ABNORMAL HIGH (ref 3.5–5.1)
Sodium: 136 mmol/L (ref 135–145)

## 2024-06-10 LAB — CBC
HCT: 30.1 % — ABNORMAL LOW (ref 39.0–52.0)
Hemoglobin: 9.7 g/dL — ABNORMAL LOW (ref 13.0–17.0)
MCH: 31 pg (ref 26.0–34.0)
MCHC: 32.2 g/dL (ref 30.0–36.0)
MCV: 96.2 fL (ref 80.0–100.0)
Platelets: 200 10*3/uL (ref 150–400)
RBC: 3.13 MIL/uL — ABNORMAL LOW (ref 4.22–5.81)
RDW: 14.3 % (ref 11.5–15.5)
WBC: 14.9 10*3/uL — ABNORMAL HIGH (ref 4.0–10.5)
nRBC: 0 % (ref 0.0–0.2)

## 2024-06-10 LAB — GLUCOSE, CAPILLARY
Glucose-Capillary: 170 mg/dL — ABNORMAL HIGH (ref 70–99)
Glucose-Capillary: 326 mg/dL — ABNORMAL HIGH (ref 70–99)
Glucose-Capillary: 348 mg/dL — ABNORMAL HIGH (ref 70–99)

## 2024-06-10 MED ORDER — INSULIN ASPART 100 UNIT/ML IJ SOLN
5.0000 [IU] | Freq: Three times a day (TID) | INTRAMUSCULAR | Status: DC
  Administered 2024-06-10 – 2024-06-12 (×6): 5 [IU] via SUBCUTANEOUS
  Filled 2024-06-10 (×6): qty 5

## 2024-06-10 MED ORDER — ORAL CARE MOUTH RINSE: 15.0000 mL | OROMUCOSAL | Status: AC | PRN

## 2024-06-10 MED ORDER — SODIUM ZIRCONIUM CYCLOSILICATE 10 G PO PACK
10.0000 g | PACK | Freq: Once | ORAL | Status: AC
  Administered 2024-06-10: 10 g via ORAL
  Filled 2024-06-10 (×2): qty 1

## 2024-06-10 MED ORDER — SODIUM ZIRCONIUM CYCLOSILICATE 10 G PO PACK: 10.0000 g | PACK | Freq: Once | ORAL | Status: DC

## 2024-06-10 MED ORDER — ISOSORBIDE MONONITRATE ER 30 MG PO TB24
30.0000 mg | ORAL_TABLET | Freq: Every day | ORAL | Status: AC
  Administered 2024-06-10 – 2024-06-13 (×4): 30 mg via ORAL
  Filled 2024-06-10 (×4): qty 1

## 2024-06-10 NOTE — Inpatient Diabetes Management (Signed)
 Inpatient Diabetes Program Recommendations  AACE/ADA: New Consensus Statement on Inpatient Glycemic Control   Target Ranges:  Prepandial:   less than 140 mg/dL      Peak postprandial:   less than 180 mg/dL (1-2 hours)      Critically ill patients:  140 - 180 mg/dL    Latest Reference Range & Units 06/09/24 08:44 06/09/24 11:43 06/09/24 16:06 06/09/24 18:44 06/09/24 21:03 06/10/24 08:49 06/10/24 12:34  Glucose-Capillary 70 - 99 mg/dL 837 (H) 778 (H) 704 (H) 303 (H) 331 (H) 170 (H) 326 (H)   Review of Glycemic Control  Diabetes history: DM2 Outpatient Diabetes medications: Lantus  5 units at bedtime, Novoiln R 2-5 units BID with meals Current orders for Inpatient glycemic control: Semglee  5 units at bedtime, Novolog  0-15 units TID with meals, Novolog  0 -5 units at bedtime; Prednisone  40 mg QAM  Inpatient Diabetes Program Recommendations:    Insulin : If steroids are continued, please consider ordering Novolog  5 units TID with meals for meal coverage if patient eats at least 50% of meals.  Thanks, Earnie Gainer, RN, MSN, CDCES Diabetes Coordinator Inpatient Diabetes Program [phone] (Team Pager from 8am to 5pm)

## 2024-06-10 NOTE — Progress Notes (Signed)
 " [facility]  ROUNDING NOTE   Subjective:   Phillip Reyes is a 84 y.o.  male with past medical history of diabetes, hypertension, GERD, anemia, COPD, and chronic systolic heart failure.  Patient presents to the emergency department with shortness of breath and has been admitted for CHF (congestive heart failure) (HCC) [I50.9] COPD exacerbation (HCC) [J44.1] Acute on chronic systolic congestive heart failure (HCC) [I50.23] Acute respiratory failure with hypoxia (HCC) [J96.01]  Patient is known to our practice from previous admissions.  He is currently followed by [facility] nephrology.  Patient seen laying in bed.  No family present at bedside.  Patient is very difficult to arouse.  When aroused, remains awake for very short periods of time.  When seen later in day, patient able to follow conversation.  Unable to recall how long he had been experiencing shortness of breath prior to ED arrival.  States he takes all medications as prescribed.  Reports mild discomfort in lower back..  Denies shortness of breath.  No known fever or chills.  Labs on ED arrival concerning for creatinine 3.44 with GFR 17, BNP greater than 10,000, troponin 34, white count 10.8 with hemoglobin 11.5.  Respiratory panel negative for influenza, COVID-19, and RSV.  Chest x-ray consistent with mild congestive heart failure with pulmonary edema and small pleural effusions.  CT head negative.  CT chest negative for pneumonia.  Renal ultrasound negative for hydronephrosis.  We have been consulted to evaluate acute kidney injury.  Objective:  Vital signs in last 24 hours:  Temp:  [97.4 F (36.3 C)-98.6 F (37 C)] 97.7 F (36.5 C) (02/03 0851) Pulse Rate:  [59-66] 59 (02/03 0851) Resp:  [17-20] 20 (02/03 0512) BP: (114-141)/(46-61) 141/61 (02/03 0851) SpO2:  [92 %-99 %] 95 % (02/03 0851) Weight:  [72.4 kg] 72.4 kg (02/03 0500)  Weight change: 1.9 kg Filed Weights   06/08/24 0458 06/09/24 0500 06/10/24 0500  Weight:  72.5 kg 70.5 kg 72.4 kg    Intake/Output: I/O last 3 completed shifts: In: 1680 [P.O.:1680] Out: 1300 [Urine:1300]   Intake/Output this shift:  No intake/output data recorded.  Physical Exam: General: NAD  Head: Normocephalic, atraumatic. Moist oral mucosal membranes  Eyes: Anicteric  Lungs:  Coarse, normal effort  Heart: Regular rate and rhythm  Abdomen:  Soft, nontender  Extremities:  No peripheral edema.  Neurologic: Awake, alert, conversant  Skin: Warm,dry, no rash  Access: None    Basic Metabolic Panel: Recent Labs  Lab 06/07/24 0510 06/08/24 0430 06/09/24 0417 06/09/24 1546 06/10/24 0451  NA 139 136 136 135 136  K 4.2 4.7 5.1 5.6* 5.3*  CL 102 100 100 100 100  CO2 23 23 25 23 23   GLUCOSE 213* 234* 180* 301* 219*  BUN 40* 56* 73* 74* 84*  CREATININE 3.62* 4.33* 4.66* 4.57* 4.65*  CALCIUM  8.9 8.3* 7.9* 8.0* 7.9*    Liver Function Tests: Recent Labs  Lab 06/06/24 0713 06/07/24 0510  AST 18 13*  ALT 6 <5  ALKPHOS 95 83  BILITOT 0.5 0.4  PROT 7.0 6.4*  ALBUMIN 4.0 3.7   No results for input(s): LIPASE, AMYLASE in the last 168 hours. No results for input(s): AMMONIA in the last 168 hours.  CBC: Recent Labs  Lab 06/06/24 0713 06/07/24 0510 06/10/24 0451  WBC 10.8* 14.1* 14.9*  NEUTROABS 6.3  --   --   HGB 11.5* 11.0* 9.7*  HCT 36.6* 33.5* 30.1*  MCV 96.3 94.4 96.2  PLT 281 248 200  Cardiac Enzymes: No results for input(s): CKTOTAL, CKMB, CKMBINDEX, TROPONINI in the last 168 hours.  BNP: Invalid input(s): POCBNP  CBG: Recent Labs  Lab 06/09/24 1606 06/09/24 1844 06/09/24 2103 06/10/24 0849 06/10/24 1234  GLUCAP 295* 303* 331* 170* 326*    Microbiology: Results for orders placed or performed during the hospital encounter of 06/06/24  Resp panel by RT-PCR (RSV, Flu A&B, Covid) Anterior Nasal Swab     Status: None   Collection Time: 06/06/24  9:36 AM   Specimen: Anterior Nasal Swab  Result Value Ref Range Status    SARS Coronavirus 2 by RT PCR NEGATIVE NEGATIVE Final    Comment: (NOTE) SARS-CoV-2 target nucleic acids are NOT DETECTED.  The SARS-CoV-2 RNA is generally detectable in upper respiratory specimens during the acute phase of infection. The lowest concentration of SARS-CoV-2 viral copies this assay can detect is 138 copies/mL. A negative result does not preclude SARS-Cov-2 infection and should not be used as the sole basis for treatment or other patient management decisions. A negative result may occur with  improper specimen collection/handling, submission of specimen other than nasopharyngeal swab, presence of viral mutation(s) within the areas targeted by this assay, and inadequate number of viral copies(<138 copies/mL). A negative result must be combined with clinical observations, patient history, and epidemiological information. The expected result is Negative.  Fact Sheet for Patients:  bloggercourse.com  Fact Sheet for Healthcare Providers:  seriousbroker.it  This test is no t yet approved or cleared by the [place]  FDA and  has been authorized for detection and/or diagnosis of SARS-CoV-2 by FDA under an Emergency Use Authorization (EUA). This EUA will remain  in effect (meaning this test can be used) for the duration of the COVID-19 declaration under Section 564(b)(1) of the Act, 21 U.S.C.section 360bbb-3(b)(1), unless the authorization is terminated  or revoked sooner.       Influenza A by PCR NEGATIVE NEGATIVE Final   Influenza B by PCR NEGATIVE NEGATIVE Final    Comment: (NOTE) The Xpert Xpress SARS-CoV-2/FLU/RSV plus assay is intended as an aid in the diagnosis of influenza from Nasopharyngeal swab specimens and should not be used as a sole basis for treatment. Nasal washings and aspirates are unacceptable for Xpert Xpress SARS-CoV-2/FLU/RSV testing.  Fact Sheet for  Patients: bloggercourse.com  Fact Sheet for Healthcare Providers: seriousbroker.it  This test is not yet approved or cleared by the [place]  FDA and has been authorized for detection and/or diagnosis of SARS-CoV-2 by FDA under an Emergency Use Authorization (EUA). This EUA will remain in effect (meaning this test can be used) for the duration of the COVID-19 declaration under Section 564(b)(1) of the Act, 21 U.S.C. section 360bbb-3(b)(1), unless the authorization is terminated or revoked.     Resp Syncytial Virus by PCR NEGATIVE NEGATIVE Final    Comment: (NOTE) Fact Sheet for Patients: bloggercourse.com  Fact Sheet for Healthcare Providers: seriousbroker.it  This test is not yet approved or cleared by the [place]  FDA and has been authorized for detection and/or diagnosis of SARS-CoV-2 by FDA under an Emergency Use Authorization (EUA). This EUA will remain in effect (meaning this test can be used) for the duration of the COVID-19 declaration under Section 564(b)(1) of the Act, 21 U.S.C. section 360bbb-3(b)(1), unless the authorization is terminated or revoked.  Performed at [facility], [address] [place], [place] [address]     Coagulation Studies: No results for input(s): LABPROT, INR in the last 72 hours.  Urinalysis: No results for  input(s): COLORURINE, LABSPEC, PHURINE, GLUCOSEU, HGBUR, BILIRUBINUR, KETONESUR, PROTEINUR, UROBILINOGEN, NITRITE, LEUKOCYTESUR in the last 72 hours.  Invalid input(s): APPERANCEUR    Imaging: CT CHEST WO CONTRAST Result Date: 06/09/2024 EXAM: CT CHEST WITHOUT CONTRAST 06/09/2024 05:26:37 PM TECHNIQUE: CT of the chest was performed without the administration of intravenous contrast. Multiplanar reformatted images are provided for review. Automated exposure control, iterative reconstruction,  and/or weight based adjustment of the mA/kV was utilized to reduce the radiation dose to as low as reasonably achievable. COMPARISON: Chest x-ray same day and CT chest 06/10/2005. CLINICAL HISTORY: aspiration? Aspiration (query). FINDINGS: MEDIASTINUM: Coronary and aortic atherosclerotic calcifications are noted. The heart is mildly enlarged. Left sided pacemaker is present. Secretions are seen layering in the bilateral mainstem bronchi. There is no plugging of bronchi. Right thyroid nodules are present measuring up to 13 mm. LYMPH NODES: No mediastinal, hilar or axillary lymphadenopathy. LUNGS AND PLEURA: Mild enlarged emphysema present. There is some dependent atelectasis in the bilateral lower lobes. There is some scattered peripheral reticular opacities, nonspecific. There is no focal lung consolidation. There are small to moderate bilateral pleural effusions. No pneumothorax. SOFT TISSUES/BONES: No acute abnormality of the bones or soft tissues. UPPER ABDOMEN: Limited images of the upper abdomen demonstrates no acute abnormality. IMPRESSION: 1. No CT evidence of aspiration pneumonia or focal lung consolidation. 2. Small to moderate bilateral pleural effusions with dependent bilateral lower lobe atelectasis. 3. Secretions layering in the bilateral mainstem bronchi without bronchial plugging. 4. Scattered peripheral reticular opacities, nonspecific. 5. Mild cardiomegaly with coronary and aortic atherosclerotic calcifications, and left-sided pacemaker. Electronically signed by: Greig Pique MD 06/09/2024 07:14 PM EST RP Workstation: [id number]   US  RENAL Result Date: 06/09/2024 EXAM: RETROPERITONEAL ULTRASOUND OF THE KIDNEYS 06/09/2024 05:16:59 PM TECHNIQUE: Real-time ultrasonography of the retroperitoneum, specifically the kidneys and urinary bladder, was performed. COMPARISON: US  Renal 04/17/2022. CLINICAL HISTORY: Acute kidney injury. FINDINGS: RIGHT KIDNEY: Right kidney measures 9.9 cm in length. Cortical  thinning with increased echogenicity. No hydronephrosis. No calculus. No mass. LEFT KIDNEY: Left kidney measures 9.3 cm in length. Cortical thinning with increased echogenicity. No hydronephrosis. No calculus. No mass. IMPRESSION: 1. Cortical thinning with increased echogenicity of both kidneys, consistent with chronic kidney disease. 2. No hydronephrosis or nephrolithiasis. Electronically signed by: Rogelia Myers MD 06/09/2024 06:18 PM EST RP Workstation: [id number]   DG Chest 2 View Result Date: 06/09/2024 CLINICAL DATA:  Dyspnea EXAM: CHEST - 2 VIEW COMPARISON:  06/06/2024 FINDINGS: LEFT chest wall AICD is unchanged in configuration. Heart size is at upper limits of normal. Mild pulmonary vasculature congestion is unchanged. Unchanged small LEFT pleural effusion. Scattered BILATERAL lung opacities, greatest at the bases, favored to be due to combination of atelectasis and pulmonary edema. IMPRESSION: No significant interval change in appearance of the chest with findings most consistent with CHF/fluid volume overload. Electronically Signed   By: Aliene Lloyd M.D.   On: 06/09/2024 10:40     Medications:     aspirin  EC  81 mg Oral Daily   atorvastatin   40 mg Oral QHS   carvedilol   6.25 mg Oral BID WC   citalopram   20 mg Oral Daily   enoxaparin  (LOVENOX ) injection  30 mg Subcutaneous Q24H   ferrous sulfate   325 mg Oral Q48H   gabapentin   300 mg Oral QHS   insulin  aspart  0-15 Units Subcutaneous TID WC   insulin  aspart  0-5 Units Subcutaneous QHS   insulin  glargine-yfgn  5 Units Subcutaneous QHS   isosorbide  mononitrate  30 mg  Oral Daily   pantoprazole   40 mg Oral BID   polyethylene glycol  17 g Oral Daily   predniSONE   40 mg Oral Q breakfast   tamsulosin   0.8 mg Oral QHS   traZODone   50 mg Oral QHS   acetaminophen  **OR** acetaminophen , ipratropium-albuterol , ondansetron  **OR** ondansetron  (ZOFRAN ) IV, mouth rinse  Assessment/ Plan:  Phillip Reyes is a 84 y.o.  male with past medical  history of diabetes, hypertension, anemia, COPD, GERD and chronic systolic heart failure. 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  Acute kidney injury secondary to cardiorenal vs progression of kidney disease. Renal US  negative for obstruction.  BUN continues to rise. Decent urine output recorded. No acute indication for dialysis, but patient agreeable to proceed if needed. Appreciate palliative care assisting with GOC and confirming patient would like dialysis short term, if needed. Will continue to monitor for now. Continue to hold nephrotoxic agents and therapies.   Lab Results  Component Value Date   CREATININE 4.65 (H) 06/10/2024   CREATININE 4.57 (H) 06/09/2024   CREATININE 4.66 (H) 06/09/2024    Intake/Output Summary (Last 24 hours) at 06/10/2024 1545 Last data filed at 06/10/2024 0010 Gross per 24 hour  Intake 960 ml  Output --  Net 960 ml   2. Hyperkalemia, likely secondary to kidney failure. Potassium 5.6, 5.3. Given temporizing measures and Lokelma    3. Anemia of chronic kidney disease Lab Results  Component Value Date   HGB 9.7 (L) 06/10/2024    Hgb within acceptable range for renal patient.   4. Chronic systolic heart failure, TTE shows EF 40-45% with mild-mod aoritc stenosis. Diuresis held due to kidney injury.    LOS: 4 Phillip Reyes 2/3/20263:45 PM   "

## 2024-06-10 NOTE — TOC Progression Note (Signed)
 Transition of Care [facility]) - Progression Note    Patient Details  Name: Phillip Reyes MRN: [id number] Date of Birth: 1940/06/02  [facility] CM/SW Contact  Lauraine [name], LCSW Phone Number: 06/10/2024, 1:05 PM  Clinical Narrative:  Gave home health offers. Will give patient time to review and determine preference.   Expected Discharge Plan and Services                                               Social Drivers of Health (SDOH) Interventions SDOH Screenings   Food Insecurity: Food Insecurity Present (06/07/2024)  Housing: Low Risk (06/08/2024)  Transportation Needs: Unmet Transportation Needs (06/07/2024)  Utilities: Not At Risk (06/07/2024)  Social Connections: Unknown (06/08/2024)  Tobacco Use: Medium Risk (06/06/2024)    Readmission Risk Interventions    04/17/2022    1:21 PM  Readmission Risk Prevention Plan  Medication Review (RN Care Manager) Complete  PCP or Specialist appointment within 3-5 days of discharge Complete  SW Recovery Care/Counseling Consult Complete  Palliative Care Screening Not Applicable  Skilled Nursing Facility Complete

## 2024-06-10 NOTE — Plan of Care (Signed)

## 2024-06-11 ENCOUNTER — Inpatient Hospital Stay

## 2024-06-11 LAB — BODY FLUID CELL COUNT WITH DIFFERENTIAL
Eos, Fluid: 0 %
Lymphs, Fluid: 28 %
Monocyte-Macrophage-Serous Fluid: 7 %
Neutrophil Count, Fluid: 50 %
Other Cells, Fluid: 15 %
Total Nucleated Cell Count, Fluid: 207 uL

## 2024-06-11 LAB — LACTATE DEHYDROGENASE: LDH: 180 U/L (ref 105–235)

## 2024-06-11 LAB — GLUCOSE, CAPILLARY
Glucose-Capillary: 271 mg/dL — ABNORMAL HIGH (ref 70–99)
Glucose-Capillary: 170 mg/dL — ABNORMAL HIGH (ref 70–99)
Glucose-Capillary: 85 mg/dL (ref 70–99)
Glucose-Capillary: 331 mg/dL — ABNORMAL HIGH (ref 70–99)
Glucose-Capillary: 414 mg/dL — ABNORMAL HIGH (ref 70–99)

## 2024-06-11 LAB — COMPREHENSIVE METABOLIC PANEL WITH GFR
ALT: 6 U/L (ref 0–44)
AST: <10 U/L — ABNORMAL LOW (ref 15–41)
Albumin: 3.2 g/dL — ABNORMAL LOW (ref 3.5–5.0)
Alkaline Phosphatase: 73 U/L (ref 38–126)
Anion gap: 11 (ref 5–15)
BUN: 96 mg/dL — ABNORMAL HIGH (ref 8–23)
CO2: 24 mmol/L (ref 22–32)
Calcium: 8 mg/dL — ABNORMAL LOW (ref 8.9–10.3)
Chloride: 101 mmol/L (ref 98–111)
Creatinine, Ser: 4.85 mg/dL — ABNORMAL HIGH (ref 0.61–1.24)
GFR, Estimated: 11 mL/min — ABNORMAL LOW
Glucose, Bld: 199 mg/dL — ABNORMAL HIGH (ref 70–99)
Potassium: 5.7 mmol/L — ABNORMAL HIGH (ref 3.5–5.1)
Sodium: 135 mmol/L (ref 135–145)
Total Bilirubin: <0.2 mg/dL (ref 0.0–1.2)
Total Protein: 5.1 g/dL — ABNORMAL LOW (ref 6.5–8.1)

## 2024-06-11 LAB — HEPATITIS B SURFACE ANTIGEN: Hepatitis B Surface Ag: NONREACTIVE

## 2024-06-11 LAB — PATHOLOGIST SMEAR REVIEW

## 2024-06-11 LAB — GLUCOSE, RANDOM: Glucose, Bld: 399 mg/dL — ABNORMAL HIGH (ref 70–99)

## 2024-06-11 MED ORDER — LIDOCAINE HCL (PF) 1 % IJ SOLN
5.0000 mL | Freq: Once | INTRAMUSCULAR | Status: AC
  Administered 2024-06-11: 5 mL via INTRADERMAL

## 2024-06-11 MED ORDER — PREDNISONE 20 MG PO TABS
20.0000 mg | ORAL_TABLET | Freq: Every day | ORAL | Status: AC
  Administered 2024-06-12 – 2024-06-13 (×2): 20 mg via ORAL
  Filled 2024-06-11 (×2): qty 1

## 2024-06-11 MED ORDER — SODIUM ZIRCONIUM CYCLOSILICATE 10 G PO PACK
10.0000 g | PACK | Freq: Four times a day (QID) | ORAL | Status: AC
  Administered 2024-06-11 (×2): 10 g via ORAL
  Filled 2024-06-11 (×2): qty 1

## 2024-06-11 MED ORDER — HYDRALAZINE HCL 10 MG PO TABS
10.0000 mg | ORAL_TABLET | Freq: Three times a day (TID) | ORAL | Status: AC
  Administered 2024-06-11 – 2024-06-13 (×5): 10 mg via ORAL
  Filled 2024-06-11 (×6): qty 1

## 2024-06-11 NOTE — Plan of Care (Signed)

## 2024-06-11 NOTE — Progress Notes (Signed)
 "                                                                                                                                                                                                          Daily Progress Note   Patient Name: Phillip Reyes       Date: 06/11/2024 DOB: Sep 28, 1940  Age: 84 y.o. MRN#: [id number] Attending Physician: Franchot Novel, MD Primary Care Physician: [facility] Admit Date: 06/06/2024  Reason for Consultation/Follow-up: Establishing goals of care  Subjective: Notes and labs reviewed.  In to see patient.  He states he is feeling better today after having his thoracentesis.  He jokes that he wishes he could go down and get more removed if it would make him feel better still.   We revisited conversation from yesterday.  He states with thought and prayer, he does not believe he would want CPR; he states he believes that he would want his transition out of this world to be in God's hands.  He states though he is still struggling with the decision.  Discussed following up again tomorrow morning to discuss definitive decisions.  He states he will continue to think on it.  Cardiology and bedside and PMT departed.  Length of Stay: 5  Current Medications: Scheduled Meds:   aspirin  EC  81 mg Oral Daily   atorvastatin   40 mg Oral QHS   carvedilol   6.25 mg Oral BID WC   citalopram   20 mg Oral Daily   enoxaparin  (LOVENOX ) injection  30 mg Subcutaneous Q24H   ferrous sulfate   325 mg Oral Q48H   gabapentin   300 mg Oral QHS   hydrALAZINE   10 mg Oral Q8H   insulin  aspart  0-15 Units Subcutaneous TID WC   insulin  aspart  0-5 Units Subcutaneous QHS   insulin  aspart  5 Units Subcutaneous TID WC   insulin  glargine-yfgn  5 Units Subcutaneous QHS   isosorbide  mononitrate  30 mg Oral Daily   pantoprazole   40 mg Oral BID   polyethylene glycol  17 g Oral Daily   [START ON 06/12/2024] predniSONE   20 mg Oral Q breakfast   sodium zirconium cyclosilicate   10 g Oral  Q6H   tamsulosin   0.8 mg Oral QHS   traZODone   50 mg Oral QHS    Continuous Infusions:   PRN Meds: acetaminophen  **OR** acetaminophen , ipratropium-albuterol , ondansetron  **OR** ondansetron  (ZOFRAN ) IV, mouth rinse  Physical Exam Pulmonary:     Effort: Pulmonary effort is normal.  Neurological:     Mental Status: He is  alert.             Vital Signs: BP (!) 131/58 (BP Location: Right Arm)   Pulse 60   Temp 97.9 F (36.6 C)   Resp 18   Ht 5' 8.5 (1.74 m)   Wt 74.7 kg   SpO2 100%   BMI 24.68 kg/m  SpO2: SpO2: 100 % O2 Device: O2 Device: Nasal Cannula O2 Flow Rate: O2 Flow Rate (L/min): 2 L/min  Intake/output summary:  Intake/Output Summary (Last 24 hours) at 06/11/2024 1451 Last data filed at 06/10/2024 2310 Gross per 24 hour  Intake 720 ml  Output 300 ml  Net 420 ml   LBM: Last BM Date : 06/09/24 Baseline Weight: Weight: 64.9 kg Most recent weight: Weight: 74.7 kg   Patient Active Problem List   Diagnosis Date Noted   CHF (congestive heart failure) (HCC) 06/06/2024   Coronary artery disease 01/10/2023   Renal artery stenosis 01/10/2023   Pneumonia 04/18/2022   Acute respiratory failure with hypoxia (HCC) 04/16/2022   Anemia of chronic kidney failure, stage 4 (severe) (HCC) 04/16/2022   Prostate cancer (HCC) 10/28/2021   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/28/2021   Paroxysmal supraventricular tachycardia 10/28/2021   Frequent falls 10/27/2021   Unable to care for self 10/27/2021   History of fracture of right hip 09/2021 10/27/2021   Fever 09/20/2021   Acute on chronic anemia 09/18/2021   Delirium 09/17/2021   Dysphagia 09/16/2021   Other fracture of right femur, initial encounter for closed fracture (HCC) 09/14/2021   Chronic systolic CHF (congestive heart failure) (HCC) 09/14/2021   CKD (chronic kidney disease) stage 4, GFR 15-29 ml/min (HCC) 09/14/2021   Hyperkalemia 09/14/2021   Enterococcus UTI 06/27/2021   Aspiration pneumonia (HCC) 06/27/2021   AKI  (acute kidney injury) 06/27/2021   Acute renal failure superimposed on stage 4 chronic kidney disease (HCC)    Malnutrition of moderate degree 06/24/2021   Monocytosis    Acute respiratory failure (HCC) 06/22/2021   COPD with acute exacerbation (HCC) 04/16/2020   CAP (community acquired pneumonia) 01/27/2019   Repeated falls 10/17/2018   Altered mental status 03/09/2016   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/04/2015   Acute encephalopathy 06/11/2015   Insulin  dependent type 2 diabetes mellitus, controlled (HCC) 06/11/2015   HTN (hypertension) 06/11/2015   COPD (chronic obstructive pulmonary disease) (HCC) 06/11/2015   H/O: stroke 06/11/2015    Palliative Care Assessment & Plan    Recommendations/Plan: PMT will follow-up tomorrow  Code Status:    Code Status Orders  (From admission, onward)           Start     Ordered   06/06/24 0942  Full code  Continuous       Question:  By:  Answer:  Consent: discussion documented in EHR   06/06/24 0942           Code Status History     Date Active Date Inactive Code Status Order ID Comments User Context   04/16/2022 0227 04/20/2022 0402 Full Code [id number]  Cleatus Delayne GAILS, MD ED   10/27/2021 2012 11/01/2021 1859 Full Code [id number]  Cleatus Delayne GAILS, MD ED   09/14/2021 1835 09/23/2021 1939 Full Code [id number]  Awanda City, MD ED   06/22/2021 1558 06/29/2021 2200 Full Code [id number]  Maranda Lonell MATSU, NP ED   11/09/2019 0053 11/11/2019 2012 DNR [id number]  Lawence, Madison LABOR, MD ED   01/28/2019 1335 01/29/2019 1759 DNR [id number]  Sainani, Vivek J, MD Inpatient   03/09/2016 1057 03/09/2016 1534 Full Code [id number]  Almeda Bernard, MD ED   06/11/2015 2343 06/13/2015 1818 Full Code [id number]  Jenel Lenis, MD Inpatient    Thank you for allowing the Palliative Medicine Team to assist in the care of this patient.   Time In: 2:20 Time Out: 2:45 Total Time 25 min Prolonged Time Billed  no        Greater than 50%  of this time was spent counseling and coordinating care related to the above assessment and plan.  Camelia Lewis, NP  Please contact Palliative Medicine Team phone at [phone] for questions and concerns.       "

## 2024-06-11 NOTE — Progress Notes (Signed)
 PT Cancellation Note  Patient Details Name: Phillip Reyes MRN: [id number] DOB: 06/03/1940   Cancelled Treatment:    Reason Eval/Treat Not Completed: Patient at procedure or test/unavailable (Chart reviewed- twice attempted to see pt previous day, busy with other providers both times. This morning pt preparing to go off floor forprocedure. WIll continue to follow and attempt again at later date/time.)  10:41 AM, 06/11/24 [name], PT, DPT Physical Therapist - [facility]  [phone] (ASCOM)    [name] 06/11/2024, 10:41 AM

## 2024-06-11 NOTE — Progress Notes (Signed)
 " PROGRESS NOTE    Phillip Reyes  FMW:[id number] DOB: 1941-04-22 DOA: 06/06/2024 PCP: [facility]    Brief Narrative:   Phillip Reyes is a pleasant 84 y.o. male with medical history significant for COPD not on home oxygen, CHF, DM, HTN,HLD,  GERD, CKD 4 who came into [facility] ED via EMS for shortness of breath.  Patient was started on CPAP for room air saturation of 88% with neb treatment by EMS.  Patient also complained of lower abdominal pain radiating to back, Solu-Medrol  125 mg IV, DuoNeb, 2 g of magnesium  given by EMS.  EMS vitals 243/94, heart rate 62.  Patient stated that he had shortness of breath starting yesterday and he was not able to breathe today.  EMS was called.  He denied any fever, chills, nausea, vomiting, chest pain, palpitations, cough, hemoptysis, hematemesis or melena.    Assessment & Plan:   Principal Problem:   CHF (congestive heart failure) (HCC) Active Problems:   Acute respiratory failure with hypoxia (HCC)   COPD with acute exacerbation (HCC)   Cardiac defibrillator in situ   Insulin  dependent type 2 diabetes mellitus, controlled (HCC)   HTN (hypertension)   H/O: stroke   Prostate cancer (HCC)   Anemia of chronic kidney failure, stage 4 (severe) (HCC)  # HFrEF with acute exacerbation # ICD status # Aortic stenosis Presenting with LE edema and dyspnea, cxr with small effusions and pulm edema, bnp elevated to 10k.   TTE shows EF 40-45, low-normal RV function, mild/mod aortic stenosis. Diuresis on pause 2/2 AKI. Breathing improved s/p thoracentesis 2/4 AM 500ccc removed.  - hold lasix  - cardiology following, ultimately may need dialysis for volume control - coreg  continued, imdur  aded - fluid restrict 1.5 liters  # COPD with acute exacerbation Wheezing on exam, could be cardiac, prudent to treat for concurrent copd. Nothing focal on cxr. Covid/flu/rsv neg. - continue steroids, taper   # Acute hypoxic respiratory failure # Aspiration #  Pleural effusion Required bipap on arrival, now stable on 2 liters but with wet cough , CXR showing persistent pulmonary edema, nothing focal. Dimer wnl for age so PE less likely. CTA with mild/mod effusions, nothing focal. SLP has seen, cleared for current dysphagia 3 diet - wean Delta o2 as able - s/p thoracentesis 500cc removed from L pleural space. 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 Renal u/s no obstruction. May need dialysis, per palliative patient ok with short-term and patient confirms this. Though made him aware he isn't a great candidate for dialysis. - nephrology folloiwng  - Lokelma  x2 2/4  - continue to hold lisinopril   # MDD - home celexa   # T2DM Glucose elevated with steroids - SSI - add mealtime, appreciate dm educator recs  # Hx CVA with residual lef-sided weakness - cont home asa/plavix   # Neuropathy - home gabapentin   # HTN Bp wnl - home lisinopril  on hold  # BPH # History prostate cancer - home flomax   # Debility PT advising snf but family opts for home with home health  # End-of-life care With worsening kidney disease, heart failure, COPD, prior stroke, poor functional status, prognosis is poor. Remains full code full scope. - palliative care consulted today    DVT prophylaxis: lovenox  Code Status: full Family Communication: daughter updated telephonically 2/3 (reports wife is on hospice and is an invalid)  Level of  care: Progressive Status is: Inpatient Remains inpatient appropriate because: severity of illness  Consultants:  Cardiology, nephrology   Procedures: none  Antimicrobials:  none    Subjective: Today feels a bit better, bit more energy, breathing a bit better  Objective: Vitals:   06/11/24 0500 06/11/24 0518 06/11/24 1010 06/11/24 1100  BP:  (!) 133/58 (!) 156/57 (!) 131/58  Pulse:  60 60 60  Resp:  18     Temp:  97.9 F (36.6 C)    TempSrc:      SpO2:  99% 100% 100%  Weight: 74.7 kg     Height:        Intake/Output Summary (Last 24 hours) at 06/11/2024 1648 Last data filed at 06/11/2024 1600 Gross per 24 hour  Intake 720 ml  Output 1200 ml  Net -480 ml   Filed Weights   06/09/24 0500 06/10/24 0500 06/11/24 0500  Weight: 70.5 kg 72.4 kg 74.7 kg    Examination:  Constitutional: In no distress.  Cardiovascular: Normal rate, regular rhythm. No lower extremity edema  Pulmonary: Non labored breathing on 1L O2, diffuse wheezing   Abdominal: Soft. Non distended and non tender Musculoskeletal: Normal range of motion.     Neurological: Alert and oriented to person, place, and time. Non focal  Skin: Skin is warm and dry.    Data Reviewed: I have personally reviewed following labs and imaging studies  CBC: Recent Labs  Lab 06/06/24 0713 06/07/24 0510 06/10/24 0451  WBC 10.8* 14.1* 14.9*  NEUTROABS 6.3  --   --   HGB 11.5* 11.0* 9.7*  HCT 36.6* 33.5* 30.1*  MCV 96.3 94.4 96.2  PLT 281 248 200   Basic Metabolic Panel: Recent Labs  Lab 06/08/24 0430 06/09/24 0417 06/09/24 1546 06/10/24 0451 06/11/24 0431  NA 136 136 135 136 135  K 4.7 5.1 5.6* 5.3* 5.7*  CL 100 100 100 100 101  CO2 23 25 23 23 24   GLUCOSE 234* 180* 301* 219* 199*  BUN 56* 73* 74* 84* 96*  CREATININE 4.33* 4.66* 4.57* 4.65* 4.85*  CALCIUM  8.3* 7.9* 8.0* 7.9* 8.0*   GFR: Estimated Creatinine Clearance: 11.4 mL/min (A) (by C-G formula based on SCr of 4.85 mg/dL (H)). Liver Function Tests: Recent Labs  Lab 06/06/24 0713 06/07/24 0510 06/11/24 0431  AST 18 13* <10*  ALT 6 <5 6  ALKPHOS 95 83 73  BILITOT 0.5 0.4 <0.2  PROT 7.0 6.4* 5.1*  ALBUMIN 4.0 3.7 3.2*   No results for input(s): LIPASE, AMYLASE in the last 168 hours. No results for input(s): AMMONIA in the last 168 hours. Coagulation Profile: Recent Labs  Lab 06/07/24 0510  INR 1.1   Cardiac Enzymes: No results for input(s):  CKTOTAL, CKMB, CKMBINDEX, TROPONINI in the last 168 hours. BNP (last 3 results) Recent Labs    06/06/24 0713  PROBNP 10,471.0*   HbA1C: No results for input(s): HGBA1C in the last 72 hours.  CBG: Recent Labs  Lab 06/10/24 1234 06/10/24 1649 06/10/24 2056 06/11/24 0847 06/11/24 1230  GLUCAP 326* 348* 271* 170* 85   Lipid Profile: No results for input(s): CHOL, HDL, LDLCALC, TRIG, CHOLHDL, LDLDIRECT in the last 72 hours. Thyroid Function Tests: No results for input(s): TSH, T4TOTAL, FREET4, T3FREE, THYROIDAB in the last 72 hours. Anemia Panel: No results for input(s): VITAMINB12, FOLATE, FERRITIN, TIBC, IRON, RETICCTPCT in the last 72 hours. Urine analysis:    Component Value Date/Time   COLORURINE STRAW (A) 10/28/2021 9364   APPEARANCEUR CLEAR (A)  10/28/2021 0635   APPEARANCEUR Clear 02/17/2013 0013   LABSPEC 1.006 10/28/2021 0635   LABSPEC 1.029 02/17/2013 0013   PHURINE 5.0 10/28/2021 0635   GLUCOSEU NEGATIVE 10/28/2021 0635   GLUCOSEU >=500 02/17/2013 0013   HGBUR NEGATIVE 10/28/2021 0635   BILIRUBINUR NEGATIVE 10/28/2021 0635   BILIRUBINUR Negative 02/17/2013 0013   KETONESUR NEGATIVE 10/28/2021 0635   PROTEINUR NEGATIVE 10/28/2021 0635   NITRITE NEGATIVE 10/28/2021 0635   LEUKOCYTESUR NEGATIVE 10/28/2021 0635   LEUKOCYTESUR Negative 02/17/2013 0013   Sepsis Labs: @LABRCNTIP (procalcitonin:4,lacticidven:4)  ) Recent Results (from the past 240 hours)  Resp panel by RT-PCR (RSV, Flu A&B, Covid) Anterior Nasal Swab     Status: None   Collection Time: 06/06/24  9:36 AM   Specimen: Anterior Nasal Swab  Result Value Ref Range Status   SARS Coronavirus 2 by RT PCR NEGATIVE NEGATIVE Final    Comment: (NOTE) SARS-CoV-2 target nucleic acids are NOT DETECTED.  The SARS-CoV-2 RNA is generally detectable in upper respiratory specimens during the acute phase of infection. The lowest concentration of SARS-CoV-2 viral copies  this assay can detect is 138 copies/mL. A negative result does not preclude SARS-Cov-2 infection and should not be used as the sole basis for treatment or other patient management decisions. A negative result may occur with  improper specimen collection/handling, submission of specimen other than nasopharyngeal swab, presence of viral mutation(s) within the areas targeted by this assay, and inadequate number of viral copies(<138 copies/mL). A negative result must be combined with clinical observations, patient history, and epidemiological information. The expected result is Negative.  Fact Sheet for Patients:  bloggercourse.com  Fact Sheet for Healthcare Providers:  seriousbroker.it  This test is no t yet approved or cleared by the [place]  FDA and  has been authorized for detection and/or diagnosis of SARS-CoV-2 by FDA under an Emergency Use Authorization (EUA). This EUA will remain  in effect (meaning this test can be used) for the duration of the COVID-19 declaration under Section 564(b)(1) of the Act, 21 U.S.C.section 360bbb-3(b)(1), unless the authorization is terminated  or revoked sooner.       Influenza A by PCR NEGATIVE NEGATIVE Final   Influenza B by PCR NEGATIVE NEGATIVE Final    Comment: (NOTE) The Xpert Xpress SARS-CoV-2/FLU/RSV plus assay is intended as an aid in the diagnosis of influenza from Nasopharyngeal swab specimens and should not be used as a sole basis for treatment. Nasal washings and aspirates are unacceptable for Xpert Xpress SARS-CoV-2/FLU/RSV testing.  Fact Sheet for Patients: bloggercourse.com  Fact Sheet for Healthcare Providers: seriousbroker.it  This test is not yet approved or cleared by the [place]  FDA and has been authorized for detection and/or diagnosis of SARS-CoV-2 by FDA under an Emergency Use Authorization (EUA). This EUA  will remain in effect (meaning this test can be used) for the duration of the COVID-19 declaration under Section 564(b)(1) of the Act, 21 U.S.C. section 360bbb-3(b)(1), unless the authorization is terminated or revoked.     Resp Syncytial Virus by PCR NEGATIVE NEGATIVE Final    Comment: (NOTE) Fact Sheet for Patients: bloggercourse.com  Fact Sheet for Healthcare Providers: seriousbroker.it  This test is not yet approved or cleared by the [place]  FDA and has been authorized for detection and/or diagnosis of SARS-CoV-2 by FDA under an Emergency Use Authorization (EUA). This EUA will remain in effect (meaning this test can be used) for the duration of the COVID-19 declaration under Section 564(b)(1) of the Act, 21 U.S.C. section 360bbb-3(b)(1), unless  the authorization is terminated or revoked.  Performed at [facility], [address] [place], [place] [address]          Radiology Studies: US  THORACENTESIS LEFT ASP PLEURAL SPACE W/IMG GUIDE Result Date: 06/11/2024 INDICATION: 84 year old male with a history of COPD, CHF, CKD 4 who presented to the ED with worsening shortness of breath. Imaging notable for bilateral pleural effusions. Request for diagnostic and therapeutic left-sided thoracentesis. EXAM: ULTRASOUND GUIDED DIAGNOSTIC AND THERAPEUTIC, LEFT-SIDED THORACENTESIS MEDICATIONS: 1% lidocaine , 6 mL. COMPLICATIONS: None immediate. PROCEDURE: An ultrasound guided thoracentesis was thoroughly discussed with the patient and questions answered. The benefits, risks, alternatives and complications were also discussed. The patient understands and wishes to proceed with the procedure. Written consent was obtained. Ultrasound was performed to localize and mark an adequate pocket of fluid in the left chest. The area was then prepped and draped in the normal sterile fashion. 1% Lidocaine  was used for local anesthesia. Under  ultrasound guidance a 6 Fr Safe-T-Centesis catheter was introduced. Thoracentesis was performed. The catheter was removed and a dressing applied. FINDINGS: A total of approximately 500 mL of clear yellow fluid was removed. Samples were sent to the laboratory as requested by the clinical team. IMPRESSION: Successful ultrasound guided left-sided thoracentesis yielding 500 mL of pleural fluid. Procedure performed by: Sherrilee Bal, PA-C and Lexi Roberts, PA-C under the supervision of Dr. VEAR Lent Electronically Signed   By: Wilkie Lent M.D.   On: 06/11/2024 16:03   DG Chest Port 1 View Result Date: 06/11/2024 CLINICAL DATA:  Status post thoracentesis. EXAM: PORTABLE CHEST 1 VIEW COMPARISON:  June 09, 2024 FINDINGS: There is stable dual lead AICD positioning. The heart size and mediastinal contours are within normal limits. Moderate severity calcification of the aortic arch is seen. Mild atelectasis and/or infiltrate is seen within the right upper lobe. There is a very small right pleural effusion. No pneumothorax is identified. Degenerative changes are present throughout the thoracic spine. IMPRESSION: 1. Mild right upper lobe atelectasis and/or infiltrate. 2. Very small right pleural effusion. 3. No evidence of pneumothorax, status post thoracentesis. Electronically Signed   By: Suzen Dials M.D.   On: 06/11/2024 11:32   CT CHEST WO CONTRAST Result Date: 06/09/2024 EXAM: CT CHEST WITHOUT CONTRAST 06/09/2024 05:26:37 PM TECHNIQUE: CT of the chest was performed without the administration of intravenous contrast. Multiplanar reformatted images are provided for review. Automated exposure control, iterative reconstruction, and/or weight based adjustment of the mA/kV was utilized to reduce the radiation dose to as low as reasonably achievable. COMPARISON: Chest x-ray same day and CT chest 06/10/2005. CLINICAL HISTORY: aspiration? Aspiration (query). FINDINGS: MEDIASTINUM: Coronary and aortic  atherosclerotic calcifications are noted. The heart is mildly enlarged. Left sided pacemaker is present. Secretions are seen layering in the bilateral mainstem bronchi. There is no plugging of bronchi. Right thyroid nodules are present measuring up to 13 mm. LYMPH NODES: No mediastinal, hilar or axillary lymphadenopathy. LUNGS AND PLEURA: Mild enlarged emphysema present. There is some dependent atelectasis in the bilateral lower lobes. There is some scattered peripheral reticular opacities, nonspecific. There is no focal lung consolidation. There are small to moderate bilateral pleural effusions. No pneumothorax. SOFT TISSUES/BONES: No acute abnormality of the bones or soft tissues. UPPER ABDOMEN: Limited images of the upper abdomen demonstrates no acute abnormality. IMPRESSION: 1. No CT evidence of aspiration pneumonia or focal lung consolidation. 2. Small to moderate bilateral pleural effusions with dependent bilateral lower lobe atelectasis. 3. Secretions layering in the bilateral mainstem bronchi  without bronchial plugging. 4. Scattered peripheral reticular opacities, nonspecific. 5. Mild cardiomegaly with coronary and aortic atherosclerotic calcifications, and left-sided pacemaker. Electronically signed by: Greig Pique MD 06/09/2024 07:14 PM EST RP Workstation: [id number]   US  RENAL Result Date: 06/09/2024 EXAM: RETROPERITONEAL ULTRASOUND OF THE KIDNEYS 06/09/2024 05:16:59 PM TECHNIQUE: Real-time ultrasonography of the retroperitoneum, specifically the kidneys and urinary bladder, was performed. COMPARISON: US  Renal 04/17/2022. CLINICAL HISTORY: Acute kidney injury. FINDINGS: RIGHT KIDNEY: Right kidney measures 9.9 cm in length. Cortical thinning with increased echogenicity. No hydronephrosis. No calculus. No mass. LEFT KIDNEY: Left kidney measures 9.3 cm in length. Cortical thinning with increased echogenicity. No hydronephrosis. No calculus. No mass. IMPRESSION: 1. Cortical thinning with increased  echogenicity of both kidneys, consistent with chronic kidney disease. 2. No hydronephrosis or nephrolithiasis. Electronically signed by: Rogelia Myers MD 06/09/2024 06:18 PM EST RP Workstation: [id number]        Scheduled Meds:  aspirin  EC  81 mg Oral Daily   atorvastatin   40 mg Oral QHS   carvedilol   6.25 mg Oral BID WC   citalopram   20 mg Oral Daily   enoxaparin  (LOVENOX ) injection  30 mg Subcutaneous Q24H   ferrous sulfate   325 mg Oral Q48H   gabapentin   300 mg Oral QHS   hydrALAZINE   10 mg Oral Q8H   insulin  aspart  0-15 Units Subcutaneous TID WC   insulin  aspart  0-5 Units Subcutaneous QHS   insulin  aspart  5 Units Subcutaneous TID WC   insulin  glargine-yfgn  5 Units Subcutaneous QHS   isosorbide  mononitrate  30 mg Oral Daily   pantoprazole   40 mg Oral BID   polyethylene glycol  17 g Oral Daily   [START ON 06/12/2024] predniSONE   20 mg Oral Q breakfast   tamsulosin   0.8 mg Oral QHS   traZODone   50 mg Oral QHS   Continuous Infusions:   LOS: 5 days     Alban Pepper, MD [facility]   If 7PM-7AM, please contact night-coverage [email or web address] Password [email or web address] 06/11/2024, 4:48 PM     "

## 2024-06-11 NOTE — Progress Notes (Addendum)
 Pt blood sugar at 414. STAT random CBG ordered. NP Foust made aware.  2228: Pt STAT random CBG resulted. NP Foust made aware and instructed primary RN to administer 5 units novolog  and the ordered the 5 units glargine.

## 2024-06-11 NOTE — TOC Progression Note (Addendum)
 Transition of Care [facility]) - Progression Note    Patient Details  Name: Phillip Reyes MRN: [id number] Date of Birth: 30-Apr-1941  [facility] CM/SW Contact  Lauraine [name], LCSW Phone Number: 06/11/2024, 10:42 AM  Clinical Narrative:  CSW went by the room to see which home health agency the patient wants. He is off the unit for ultrasound. Will try again later.   12:18 pm: Patient prefers [facility] because they work with his wife. Liaison is aware. CSW sent secure chat to MD requesting home health order for PT, OT, RN so [facility] can start [place] auth.  Expected Discharge Plan and Services                                               Social Drivers of Health (SDOH) Interventions SDOH Screenings   Food Insecurity: Food Insecurity Present (06/07/2024)  Housing: Low Risk (06/08/2024)  Transportation Needs: Unmet Transportation Needs (06/07/2024)  Utilities: Not At Risk (06/07/2024)  Social Connections: Unknown (06/08/2024)  Tobacco Use: Medium Risk (06/06/2024)    Readmission Risk Interventions    04/17/2022    1:21 PM  Readmission Risk Prevention Plan  Medication Review (RN Care Manager) Complete  PCP or Specialist appointment within 3-5 days of discharge Complete  SW Recovery Care/Counseling Consult Complete  Palliative Care Screening Not Applicable  Skilled Nursing Facility Complete

## 2024-06-11 NOTE — Progress Notes (Signed)
 " [facility]  ROUNDING NOTE   Subjective:   Phillip Reyes is a 84 y.o.  male with past medical history of diabetes, hypertension, GERD, anemia, COPD, and chronic systolic heart failure.  Patient presents to the emergency department with shortness of breath and has been admitted for CHF (congestive heart failure) (HCC) [I50.9] COPD exacerbation (HCC) [J44.1] Acute on chronic systolic congestive heart failure (HCC) [I50.23] Acute respiratory failure with hypoxia (HCC) [J96.01]  Patient is known to our practice from previous admissions.  He is currently followed by [facility] nephrology.    Update: Patient sitting up in bed Arousable for short periods.  Room air States his breathing has improved since thoracentesis.   Objective:  Vital signs in last 24 hours:  Temp:  [97.7 F (36.5 C)-98.1 F (36.7 C)] 97.9 F (36.6 C) (02/04 0518) Pulse Rate:  [59-60] 60 (02/04 1100) Resp:  [18-20] 18 (02/04 0518) BP: (111-156)/(47-58) 131/58 (02/04 1100) SpO2:  [98 %-100 %] 100 % (02/04 1100) Weight:  [74.7 kg] 74.7 kg (02/04 0500)  Weight change: 2.3 kg Filed Weights   06/09/24 0500 06/10/24 0500 06/11/24 0500  Weight: 70.5 kg 72.4 kg 74.7 kg    Intake/Output: I/O last 3 completed shifts: In: 1680 [P.O.:1680] Out: 300 [Urine:300]   Intake/Output this shift:  No intake/output data recorded.  Physical Exam: General: NAD  Head: Normocephalic, atraumatic. Moist oral mucosal membranes  Eyes: Anicteric  Lungs:  Coarse, normal effort  Heart: Regular rate and rhythm  Abdomen:  Soft, nontender  Extremities:  No peripheral edema.  Neurologic: Awake, alert, conversant  Skin: Warm,dry, no rash  Access: None    Basic Metabolic Panel: Recent Labs  Lab 06/08/24 0430 06/09/24 0417 06/09/24 1546 06/10/24 0451 06/11/24 0431  NA 136 136 135 136 135  K 4.7 5.1 5.6* 5.3* 5.7*  CL 100 100 100 100 101  CO2 23 25 23 23 24   GLUCOSE 234* 180* 301* 219* 199*  BUN 56* 73* 74* 84* 96*   CREATININE 4.33* 4.66* 4.57* 4.65* 4.85*  CALCIUM  8.3* 7.9* 8.0* 7.9* 8.0*    Liver Function Tests: Recent Labs  Lab 06/06/24 0713 06/07/24 0510 06/11/24 0431  AST 18 13* <10*  ALT 6 <5 6  ALKPHOS 95 83 73  BILITOT 0.5 0.4 <0.2  PROT 7.0 6.4* 5.1*  ALBUMIN 4.0 3.7 3.2*   No results for input(s): LIPASE, AMYLASE in the last 168 hours. No results for input(s): AMMONIA in the last 168 hours.  CBC: Recent Labs  Lab 06/06/24 0713 06/07/24 0510 06/10/24 0451  WBC 10.8* 14.1* 14.9*  NEUTROABS 6.3  --   --   HGB 11.5* 11.0* 9.7*  HCT 36.6* 33.5* 30.1*  MCV 96.3 94.4 96.2  PLT 281 248 200    Cardiac Enzymes: No results for input(s): CKTOTAL, CKMB, CKMBINDEX, TROPONINI in the last 168 hours.  BNP: Invalid input(s): POCBNP  CBG: Recent Labs  Lab 06/10/24 1234 06/10/24 1649 06/10/24 2056 06/11/24 0847 06/11/24 1230  GLUCAP 326* 348* 271* 170* 85    Microbiology: Results for orders placed or performed during the hospital encounter of 06/06/24  Resp panel by RT-PCR (RSV, Flu A&B, Covid) Anterior Nasal Swab     Status: None   Collection Time: 06/06/24  9:36 AM   Specimen: Anterior Nasal Swab  Result Value Ref Range Status   SARS Coronavirus 2 by RT PCR NEGATIVE NEGATIVE Final    Comment: (NOTE) SARS-CoV-2 target nucleic acids are NOT DETECTED.  The SARS-CoV-2 RNA is generally  detectable in upper respiratory specimens during the acute phase of infection. The lowest concentration of SARS-CoV-2 viral copies this assay can detect is 138 copies/mL. A negative result does not preclude SARS-Cov-2 infection and should not be used as the sole basis for treatment or other patient management decisions. A negative result may occur with  improper specimen collection/handling, submission of specimen other than nasopharyngeal swab, presence of viral mutation(s) within the areas targeted by this assay, and inadequate number of viral copies(<138 copies/mL). A  negative result must be combined with clinical observations, patient history, and epidemiological information. The expected result is Negative.  Fact Sheet for Patients:  bloggercourse.com  Fact Sheet for Healthcare Providers:  seriousbroker.it  This test is no t yet approved or cleared by the [place]  FDA and  has been authorized for detection and/or diagnosis of SARS-CoV-2 by FDA under an Emergency Use Authorization (EUA). This EUA will remain  in effect (meaning this test can be used) for the duration of the COVID-19 declaration under Section 564(b)(1) of the Act, 21 U.S.C.section 360bbb-3(b)(1), unless the authorization is terminated  or revoked sooner.       Influenza A by PCR NEGATIVE NEGATIVE Final   Influenza B by PCR NEGATIVE NEGATIVE Final    Comment: (NOTE) The Xpert Xpress SARS-CoV-2/FLU/RSV plus assay is intended as an aid in the diagnosis of influenza from Nasopharyngeal swab specimens and should not be used as a sole basis for treatment. Nasal washings and aspirates are unacceptable for Xpert Xpress SARS-CoV-2/FLU/RSV testing.  Fact Sheet for Patients: bloggercourse.com  Fact Sheet for Healthcare Providers: seriousbroker.it  This test is not yet approved or cleared by the [place]  FDA and has been authorized for detection and/or diagnosis of SARS-CoV-2 by FDA under an Emergency Use Authorization (EUA). This EUA will remain in effect (meaning this test can be used) for the duration of the COVID-19 declaration under Section 564(b)(1) of the Act, 21 U.S.C. section 360bbb-3(b)(1), unless the authorization is terminated or revoked.     Resp Syncytial Virus by PCR NEGATIVE NEGATIVE Final    Comment: (NOTE) Fact Sheet for Patients: bloggercourse.com  Fact Sheet for Healthcare  Providers: seriousbroker.it  This test is not yet approved or cleared by the [place]  FDA and has been authorized for detection and/or diagnosis of SARS-CoV-2 by FDA under an Emergency Use Authorization (EUA). This EUA will remain in effect (meaning this test can be used) for the duration of the COVID-19 declaration under Section 564(b)(1) of the Act, 21 U.S.C. section 360bbb-3(b)(1), unless the authorization is terminated or revoked.  Performed at [facility], [address] [place], [place] [address]     Coagulation Studies: No results for input(s): LABPROT, INR in the last 72 hours.  Urinalysis: No results for input(s): COLORURINE, LABSPEC, PHURINE, GLUCOSEU, HGBUR, BILIRUBINUR, KETONESUR, PROTEINUR, UROBILINOGEN, NITRITE, LEUKOCYTESUR in the last 72 hours.  Invalid input(s): APPERANCEUR    Imaging: DG Chest Port 1 View Result Date: 06/11/2024 CLINICAL DATA:  Status post thoracentesis. EXAM: PORTABLE CHEST 1 VIEW COMPARISON:  June 09, 2024 FINDINGS: There is stable dual lead AICD positioning. The heart size and mediastinal contours are within normal limits. Moderate severity calcification of the aortic arch is seen. Mild atelectasis and/or infiltrate is seen within the right upper lobe. There is a very small right pleural effusion. No pneumothorax is identified. Degenerative changes are present throughout the thoracic spine. IMPRESSION: 1. Mild right upper lobe atelectasis and/or infiltrate. 2. Very small right pleural effusion. 3. No evidence of  pneumothorax, status post thoracentesis. Electronically Signed   By: Suzen Dials M.D.   On: 06/11/2024 11:32   CT CHEST WO CONTRAST Result Date: 06/09/2024 EXAM: CT CHEST WITHOUT CONTRAST 06/09/2024 05:26:37 PM TECHNIQUE: CT of the chest was performed without the administration of intravenous contrast. Multiplanar reformatted images are provided for review.  Automated exposure control, iterative reconstruction, and/or weight based adjustment of the mA/kV was utilized to reduce the radiation dose to as low as reasonably achievable. COMPARISON: Chest x-ray same day and CT chest 06/10/2005. CLINICAL HISTORY: aspiration? Aspiration (query). FINDINGS: MEDIASTINUM: Coronary and aortic atherosclerotic calcifications are noted. The heart is mildly enlarged. Left sided pacemaker is present. Secretions are seen layering in the bilateral mainstem bronchi. There is no plugging of bronchi. Right thyroid nodules are present measuring up to 13 mm. LYMPH NODES: No mediastinal, hilar or axillary lymphadenopathy. LUNGS AND PLEURA: Mild enlarged emphysema present. There is some dependent atelectasis in the bilateral lower lobes. There is some scattered peripheral reticular opacities, nonspecific. There is no focal lung consolidation. There are small to moderate bilateral pleural effusions. No pneumothorax. SOFT TISSUES/BONES: No acute abnormality of the bones or soft tissues. UPPER ABDOMEN: Limited images of the upper abdomen demonstrates no acute abnormality. IMPRESSION: 1. No CT evidence of aspiration pneumonia or focal lung consolidation. 2. Small to moderate bilateral pleural effusions with dependent bilateral lower lobe atelectasis. 3. Secretions layering in the bilateral mainstem bronchi without bronchial plugging. 4. Scattered peripheral reticular opacities, nonspecific. 5. Mild cardiomegaly with coronary and aortic atherosclerotic calcifications, and left-sided pacemaker. Electronically signed by: Greig Pique MD 06/09/2024 07:14 PM EST RP Workstation: [id number]   US  RENAL Result Date: 06/09/2024 EXAM: RETROPERITONEAL ULTRASOUND OF THE KIDNEYS 06/09/2024 05:16:59 PM TECHNIQUE: Real-time ultrasonography of the retroperitoneum, specifically the kidneys and urinary bladder, was performed. COMPARISON: US  Renal 04/17/2022. CLINICAL HISTORY: Acute kidney injury. FINDINGS: RIGHT  KIDNEY: Right kidney measures 9.9 cm in length. Cortical thinning with increased echogenicity. No hydronephrosis. No calculus. No mass. LEFT KIDNEY: Left kidney measures 9.3 cm in length. Cortical thinning with increased echogenicity. No hydronephrosis. No calculus. No mass. IMPRESSION: 1. Cortical thinning with increased echogenicity of both kidneys, consistent with chronic kidney disease. 2. No hydronephrosis or nephrolithiasis. Electronically signed by: Rogelia Myers MD 06/09/2024 06:18 PM EST RP Workstation: [id number]     Medications:     aspirin  EC  81 mg Oral Daily   atorvastatin   40 mg Oral QHS   carvedilol   6.25 mg Oral BID WC   citalopram   20 mg Oral Daily   enoxaparin  (LOVENOX ) injection  30 mg Subcutaneous Q24H   ferrous sulfate   325 mg Oral Q48H   gabapentin   300 mg Oral QHS   hydrALAZINE   10 mg Oral Q8H   insulin  aspart  0-15 Units Subcutaneous TID WC   insulin  aspart  0-5 Units Subcutaneous QHS   insulin  aspart  5 Units Subcutaneous TID WC   insulin  glargine-yfgn  5 Units Subcutaneous QHS   isosorbide  mononitrate  30 mg Oral Daily   pantoprazole   40 mg Oral BID   polyethylene glycol  17 g Oral Daily   [START ON 06/12/2024] predniSONE   20 mg Oral Q breakfast   tamsulosin   0.8 mg Oral QHS   traZODone   50 mg Oral QHS   acetaminophen  **OR** acetaminophen , ipratropium-albuterol , ondansetron  **OR** ondansetron  (ZOFRAN ) IV, mouth rinse  Assessment/ Plan:  Mr. Phillip Reyes is a 84 y.o.  male with past medical history of diabetes, hypertension, anemia, COPD, GERD and chronic systolic heart  failure. 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  Acute  kidney injury secondary to cardiorenal vs progression of kidney disease. Renal US  negative for obstruction.    Creatinine continues to rise. No urine output recorded yesterday. Patient is agreeable to proceed with dialysis, if needed. No acute indication but if function does not improve, will need to make that decision. Will continue to monitor.  Lab Results  Component Value Date   CREATININE 4.85 (H) 06/11/2024   CREATININE 4.65 (H) 06/10/2024   CREATININE 4.57 (H) 06/09/2024    Intake/Output Summary (Last 24 hours) at 06/11/2024 1538 Last data filed at 06/10/2024 2310 Gross per 24 hour  Intake 720 ml  Output 300 ml  Net 420 ml   2. Hyperkalemia, likely secondary to kidney failure. Potassium 5.7 today. Given Lokelma  10 g x 2.   3. Anemia of chronic kidney disease Lab Results  Component Value Date   HGB 9.7 (L) 06/10/2024    Hgb within acceptable range for renal patient.   4. Chronic systolic heart failure, TTE shows EF 40-45% with mild-mod aoritc stenosis. Diuresis held due to kidney injury.    LOS: 5 Phillip Reyes 2/4/20263:38 PM   "

## 2024-06-11 NOTE — Inpatient Diabetes Management (Addendum)
 Inpatient Diabetes Program Recommendations  AACE/ADA: New Consensus Statement on Inpatient Glycemic Control   Target Ranges:  Prepandial:   less than 140 mg/dL      Peak postprandial:   less than 180 mg/dL (1-2 hours)      Critically ill patients:  140 - 180 mg/dL    Latest Reference Range & Units 06/09/24 08:44 06/09/24 11:43 06/09/24 16:06 06/09/24 18:44 06/09/24 21:03 06/10/24 08:49 06/10/24 12:34 06/10/24 16:49  Glucose-Capillary 70 - 99 mg/dL 837 (H) 778 (H) 704 (H) 303 (H) 331 (H) 170 (H) 326 (H) 348 (H)   Review of Glycemic Control  Diabetes history: DM2 Outpatient Diabetes medications: Lantus  5 units at bedtime, Novoiln R 2-5 units BID with meals Current orders for Inpatient glycemic control: Semglee  5 units at bedtime, Novolog  0-15 units TID with meals, Novolog  0 -5 units at bedtime, Novolog  5 units TID with meals; Prednisone  40 mg QAM   Inpatient Diabetes Program Recommendations:     Insulin : If steroids are continued, please consider increasing  meal coverage to Novolog  8 units TID with meals if patient eats at least 50% of meals.   Thanks, Earnie Gainer, RN, MSN, CDCES Diabetes Coordinator Inpatient Diabetes Program [phone] (Team Pager from 8am to 5pm)

## 2024-06-11 NOTE — Procedures (Signed)
 PROCEDURE SUMMARY:  Successful US  guided left-sided thoracentesis. Yielded of clear yellow fluid. Patient tolerated procedure well. No immediate complications. EBL = trace  Specimen sent for labs.  Post procedure chest X-ray reveals no pneumothorax  Fleming Prill CHRISTELLA Bal PA-C 06/11/2024 12:29 PM

## 2024-06-11 NOTE — Progress Notes (Signed)
 "  Rounding Note   Patient Name: Phillip Reyes Date of Encounter: 06/11/2024  Cardiologist: [facility]  Subjective S/p L thoracentesis with 500 mL fluid yielded this morning. Breathing is improved. BP trending up. Renal function continues to worsen.   Scheduled Meds:  aspirin  EC  81 mg Oral Daily   atorvastatin   40 mg Oral QHS   carvedilol   6.25 mg Oral BID WC   citalopram   20 mg Oral Daily   enoxaparin  (LOVENOX ) injection  30 mg Subcutaneous Q24H   ferrous sulfate   325 mg Oral Q48H   gabapentin   300 mg Oral QHS   insulin  aspart  0-15 Units Subcutaneous TID WC   insulin  aspart  0-5 Units Subcutaneous QHS   insulin  aspart  5 Units Subcutaneous TID WC   insulin  glargine-yfgn  5 Units Subcutaneous QHS   isosorbide  mononitrate  30 mg Oral Daily   pantoprazole   40 mg Oral BID   polyethylene glycol  17 g Oral Daily   predniSONE   40 mg Oral Q breakfast   sodium zirconium cyclosilicate   10 g Oral Q6H   tamsulosin   0.8 mg Oral QHS   traZODone   50 mg Oral QHS   Continuous Infusions:  PRN Meds: acetaminophen  **OR** acetaminophen , ipratropium-albuterol , ondansetron  **OR** ondansetron  (ZOFRAN ) IV, mouth rinse   Vital Signs  Vitals:   06/10/24 2348 06/11/24 0500 06/11/24 0518 06/11/24 1010  BP: (!) 129/52  (!) 133/58 (!) 156/57  Pulse: 60  60 60  Resp: 20  18   Temp: 98.1 F (36.7 C)  97.9 F (36.6 C)   TempSrc:      SpO2: 100%  99% 100%  Weight:  74.7 kg    Height:        Intake/Output Summary (Last 24 hours) at 06/11/2024 1056 Last data filed at 06/10/2024 2310 Gross per 24 hour  Intake 720 ml  Output 300 ml  Net 420 ml      06/11/2024    5:00 AM 06/10/2024    5:00 AM 06/09/2024    5:00 AM  Last 3 Weights  Weight (lbs) 164 lb 10.9 oz 159 lb 9.8 oz 155 lb 6.8 oz  Weight (kg) 74.7 kg 72.4 kg 70.5 kg      Telemetry Atrial paced with intermittent atrial sensed and ventricular paced rhythm - Personally Reviewed  Physical Exam  GEN: No acute distress.   Neck: No  JVD Cardiac: RRR, no murmurs, rubs, or gallops.  Respiratory: Coarse breath sounds throughout GI: Soft, nontender, non-distended  MS: No edema; No deformity. Neuro:  Nonfocal  Psych: Normal affect   Labs High Sensitivity Troponin:  No results for input(s): TROPONINIHS in the last 720 hours.  Recent Labs  Lab 06/06/24 0713 06/06/24 0936  TRNPT 34* 32*       Chemistry Recent Labs  Lab 06/06/24 0713 06/07/24 0510 06/08/24 0430 06/09/24 1546 06/10/24 0451 06/11/24 0431  NA 135 139   < > 135 136 135  K 4.4 4.2   < > 5.6* 5.3* 5.7*  CL 102 102   < > 100 100 101  CO2 22 23   < > 23 23 24   GLUCOSE 210* 213*   < > 301* 219* 199*  BUN 35* 40*   < > 74* 84* 96*  CREATININE 3.44* 3.62*   < > 4.57* 4.65* 4.85*  CALCIUM  8.6* 8.9   < > 8.0* 7.9* 8.0*  PROT 7.0 6.4*  --   --   --  5.1*  ALBUMIN  4.0 3.7  --   --   --  3.2*  AST 18 13*  --   --   --  <10*  ALT 6 <5  --   --   --  6  ALKPHOS 95 83  --   --   --  73  BILITOT 0.5 0.4  --   --   --  <0.2  GFRNONAA 17* 16*   < > 12* 12* 11*  ANIONGAP 11 15   < > 12 12 11    < > = values in this interval not displayed.    Lipids No results for input(s): CHOL, TRIG, HDL, LABVLDL, LDLCALC, CHOLHDL in the last 168 hours.  Hematology Recent Labs  Lab 06/06/24 0713 06/07/24 0510 06/10/24 0451  WBC 10.8* 14.1* 14.9*  RBC 3.80* 3.55* 3.13*  HGB 11.5* 11.0* 9.7*  HCT 36.6* 33.5* 30.1*  MCV 96.3 94.4 96.2  MCH 30.3 31.0 31.0  MCHC 31.4 32.8 32.2  RDW 14.2 14.1 14.3  PLT 281 248 200   Thyroid No results for input(s): TSH, FREET4 in the last 168 hours.  BNP Recent Labs  Lab 06/06/24 0713  PROBNP 10,471.0*    DDimer  Recent Labs  Lab 06/07/24 1619  DDIMER 0.77*     Radiology  CT CHEST WO CONTRAST Result Date: 06/09/2024 IMPRESSION: 1. No CT evidence of aspiration pneumonia or focal lung consolidation. 2. Small to moderate bilateral pleural effusions with dependent bilateral lower lobe atelectasis. 3. Secretions  layering in the bilateral mainstem bronchi without bronchial plugging. 4. Scattered peripheral reticular opacities, nonspecific. 5. Mild cardiomegaly with coronary and aortic atherosclerotic calcifications, and left-sided pacemaker. Electronically signed by: Greig Pique MD 06/09/2024 07:14 PM EST RP Workstation: [id number]   US  RENAL Result Date: 06/09/2024 IMPRESSION: 1. Cortical thinning with increased echogenicity of both kidneys, consistent with chronic kidney disease. 2. No hydronephrosis or nephrolithiasis. Electronically signed by: Rogelia Myers MD 06/09/2024 06:18 PM EST RP Workstation: [id number]   DG Chest 2 View Result Date: 06/09/2024 IMPRESSION: No significant interval change in appearance of the chest with findings most consistent with CHF/fluid volume overload. Electronically Signed   By: Aliene Lloyd M.D.   On: 06/09/2024 10:40    Cardiac Studies  06/07/2024 2D echo 1. Mild apical hypo.   2. Mild/Mod Aortic stenosis Valve area 1.1 cm2.   3. Left ventricular ejection fraction, by estimation, is 40 to 45%. The  left ventricle has mildly decreased function. The left ventricle  demonstrates global hypokinesis. Left ventricular diastolic parameters  were normal.   4. Right ventricular systolic function is low normal. The right  ventricular size is normal.   5. The mitral valve is normal in structure. Trivial mitral valve  regurgitation.   6. The aortic valve is calcified. There is severe thickening of the  aortic valve. Aortic valve regurgitation is mild.   Patient Profile   84 y.o. male  with a hx of CHF, bradycardia s/p ICD, CAD, COPD, hypertension, hyperlipidemia, type 2 diabetes, GERD, and CKD who was admitted 1/30 with dyspnea, found to have acute on chronic CHF and COPD, who is being seen for the management of CHF.   Assessment & Plan   Acute on chronic HFmrEF - History of CHF dating back to at least 2021 with EF of 35% at that time.  Most recent echo 04/2022 with EF  improved at 55 to 60%. - Initially presenting 1/30 with worsening shortness of breath and lower extremity swelling, proBNP 10,471 and chest  x-ray showing mild CHF/pulmonary edema with small bilateral pleural effusions - Echo this admission revealed mildly reduced LV systolic function with EF 40 to 45% - Has been IV diuresed during admission with net output of -1.6 L - Renal function continues to worsen despite holding diuresis - Continue to hold diuresis and lisinopril  - Continue carvedilol  6.25 mg twice daily - Continue Imdur  30 mg daily. Start hydralazine  10 mg three times daily.  - Seen by nephrology yesterday who did not recommend dialysis at this time    Acute hypoxic respiratory failure COPD exacerbation - Initially requiring CPAP, now weaned to 2 L nasal cannula - In the setting of acute on chronic CHF and COPD exacerbation - Hold diuresis - S/p L thoracentesis yielding 500 mL this morning with symptomatic improvement - Ongoing management of COPD per IM   CKD IV/V - BUN/creatinine up significantly from baseline, worsening with diuresis - Continue to avoid nephrotoxic agents - Hold off on further IV diuresis, as above - Nephrology following   Essential hypertension - Holding PTA lisinopril  - Continue imdur  and carvedilol  as above - BP well-controlled   Aortic stenosis - Mild to moderate on echo this admission - Continue to monitor as outpatient  For questions or updates, please contact [facility] Please consult [email or web address] for contact info under       Signed, Lesley LITTIE Maffucci, PA-C  06/11/2024, 10:56 AM    "

## 2024-06-12 ENCOUNTER — Inpatient Hospital Stay

## 2024-06-12 LAB — GLUCOSE, CAPILLARY
Glucose-Capillary: 239 mg/dL — ABNORMAL HIGH (ref 70–99)
Glucose-Capillary: 260 mg/dL — ABNORMAL HIGH (ref 70–99)
Glucose-Capillary: 302 mg/dL — ABNORMAL HIGH (ref 70–99)
Glucose-Capillary: 321 mg/dL — ABNORMAL HIGH (ref 70–99)
Glucose-Capillary: 333 mg/dL — ABNORMAL HIGH (ref 70–99)

## 2024-06-12 LAB — LD, BODY FLUID (OTHER): LD, Body Fluid: 86 [IU]/L

## 2024-06-12 LAB — RENAL FUNCTION PANEL
Albumin: 3.3 g/dL — ABNORMAL LOW (ref 3.5–5.0)
Anion gap: 12 (ref 5–15)
BUN: 110 mg/dL — ABNORMAL HIGH (ref 8–23)
CO2: 24 mmol/L (ref 22–32)
Calcium: 8 mg/dL — ABNORMAL LOW (ref 8.9–10.3)
Chloride: 100 mmol/L (ref 98–111)
Creatinine, Ser: 4.52 mg/dL — ABNORMAL HIGH (ref 0.61–1.24)
GFR, Estimated: 12 mL/min — ABNORMAL LOW
Glucose, Bld: 224 mg/dL — ABNORMAL HIGH (ref 70–99)
Phosphorus: 4.7 mg/dL — ABNORMAL HIGH (ref 2.5–4.6)
Potassium: 5.4 mmol/L — ABNORMAL HIGH (ref 3.5–5.1)
Sodium: 137 mmol/L (ref 135–145)

## 2024-06-12 LAB — GLUCOSE, BODY FLUID OTHER: Glucose, Body Fluid Other: 190 mg/dL

## 2024-06-12 LAB — PROTEIN, BODY FLUID (OTHER): Total Protein, Body Fluid Other: 1.8 g/dL

## 2024-06-12 LAB — HEPATITIS B SURFACE ANTIBODY, QUANTITATIVE: Hep B S AB Quant (Post): <3.5 m[IU]/mL — ABNORMAL LOW

## 2024-06-12 MED ORDER — METOCLOPRAMIDE HCL 5 MG/ML IJ SOLN
5.0000 mg | Freq: Once | INTRAMUSCULAR | Status: AC
  Administered 2024-06-12: 5 mg via INTRAVENOUS
  Filled 2024-06-12: qty 2

## 2024-06-12 MED ORDER — IPRATROPIUM-ALBUTEROL 0.5-2.5 (3) MG/3ML IN SOLN: 3.0000 mL | Freq: Two times a day (BID) | RESPIRATORY_TRACT | Status: DC

## 2024-06-12 MED ORDER — SODIUM ZIRCONIUM CYCLOSILICATE 10 G PO PACK
10.0000 g | PACK | Freq: Four times a day (QID) | ORAL | Status: AC
  Administered 2024-06-12: 10 g via ORAL
  Filled 2024-06-12 (×2): qty 1

## 2024-06-12 MED ORDER — INSULIN ASPART 100 UNIT/ML IJ SOLN
7.0000 [IU] | Freq: Three times a day (TID) | INTRAMUSCULAR | Status: AC
  Administered 2024-06-12 – 2024-06-13 (×4): 7 [IU] via SUBCUTANEOUS
  Filled 2024-06-12 (×4): qty 7

## 2024-06-12 MED ORDER — BUDESON-GLYCOPYRROL-FORMOTEROL 160-9-4.8 MCG/ACT IN AERO
2.0000 | INHALATION_SPRAY | Freq: Two times a day (BID) | RESPIRATORY_TRACT | Status: AC
  Administered 2024-06-12 – 2024-06-13 (×4): 2 via RESPIRATORY_TRACT
  Filled 2024-06-12: qty 5.9

## 2024-06-12 MED ORDER — IPRATROPIUM-ALBUTEROL 0.5-2.5 (3) MG/3ML IN SOLN
3.0000 mL | Freq: Two times a day (BID) | RESPIRATORY_TRACT | Status: AC
  Administered 2024-06-12 – 2024-06-13 (×3): 3 mL via RESPIRATORY_TRACT
  Filled 2024-06-12 (×3): qty 3

## 2024-06-12 MED ORDER — SODIUM CHLORIDE 0.9 % IV SOLN: INTRAVENOUS | Status: AC

## 2024-06-12 MED ORDER — IPRATROPIUM-ALBUTEROL 0.5-2.5 (3) MG/3ML IN SOLN: 3.0000 mL | RESPIRATORY_TRACT | Status: DC | PRN

## 2024-06-12 MED ORDER — INSULIN GLARGINE-YFGN 100 UNIT/ML ~~LOC~~ SOLN
7.0000 [IU] | Freq: Every day | SUBCUTANEOUS | Status: AC
  Administered 2024-06-12 – 2024-06-13 (×2): 7 [IU] via SUBCUTANEOUS
  Filled 2024-06-12 (×2): qty 0.07

## 2024-06-12 MED ORDER — NEPRO/CARBSTEADY PO LIQD
237.0000 mL | Freq: Two times a day (BID) | ORAL | Status: AC
  Administered 2024-06-12 – 2024-06-13 (×2): 237 mL via ORAL

## 2024-06-12 NOTE — TOC Progression Note (Addendum)
 Transition of Care [facility]) - Progression Note    Patient Details  Name: Phillip Reyes MRN: [id number] Date of Birth: 21-Dec-1940  [facility] CM/SW Contact  Lauraine [name], LCSW Phone Number: 06/12/2024, 9:44 AM  Clinical Narrative:  Patient remains on acute oxygen. CSW emailed the [place] hospital liaison to see if she would recommend that he get home oxygen through them or use his [place] [employer] to get it through an external agency if needed at discharge.   9:58 am: [facility] to see if we can go ahead and get sats test or if we need to wait until at least 48 hours out from discharge.  2:39 pm: No call back from [place] [facility] so far. CSW asked RN to complete sats test.  Expected Discharge Plan and Services                                               Social Drivers of Health (SDOH) Interventions SDOH Screenings   Food Insecurity: Food Insecurity Present (06/07/2024)  Housing: Low Risk (06/08/2024)  Transportation Needs: Unmet Transportation Needs (06/07/2024)  Utilities: Not At Risk (06/07/2024)  Social Connections: Unknown (06/08/2024)  Tobacco Use: Medium Risk (06/06/2024)    Readmission Risk Interventions    04/17/2022    1:21 PM  Readmission Risk Prevention Plan  Medication Review (RN Care Manager) Complete  PCP or Specialist appointment within 3-5 days of discharge Complete  SW Recovery Care/Counseling Consult Complete  Palliative Care Screening Not Applicable  Skilled Nursing Facility Complete

## 2024-06-12 NOTE — Progress Notes (Signed)
 " PROGRESS NOTE    Phillip Reyes  FMW:[id number] DOB: July 11, 1940 DOA: 06/06/2024 PCP: [facility]    Brief Narrative:   Phillip Reyes is a pleasant 84 y.o. male with medical history significant for COPD not on home oxygen, CHF, DM, HTN,HLD,  GERD, CKD 4 who came into [facility] ED via EMS for shortness of breath.  Patient was started on CPAP for room air saturation of 88% with neb treatment by EMS.  Patient also complained of lower abdominal pain radiating to back, Solu-Medrol  125 mg IV, DuoNeb, 2 g of magnesium  given by EMS.  EMS vitals 243/94, heart rate 62.  Patient stated that he had shortness of breath starting yesterday and he was not able to breathe today.  EMS was called.  He denied any fever, chills, nausea, vomiting, chest pain, palpitations, cough, hemoptysis, hematemesis or melena.    Assessment & Plan:   Principal Problem:   CHF (congestive heart failure) (HCC) Active Problems:   Acute respiratory failure with hypoxia (HCC)   COPD exacerbation (HCC)   Cardiac defibrillator in situ   Insulin  dependent type 2 diabetes mellitus, controlled (HCC)   HTN (hypertension)   H/O: stroke   Prostate cancer (HCC)   Anemia of chronic kidney failure, stage 4 (severe) (HCC)  # HFrEF with acute exacerbation # ICD status # Aortic stenosis Presenting with LE edema and dyspnea, cxr with small effusions and pulm edema, bnp elevated to 10k.   TTE shows EF 40-45, low-normal RV function, mild/mod aortic stenosis. Diuresis on pause 2/2 AKI. Breathing improved s/p thoracentesis 2/4 AM 500ccc removed. Transudative - cardiology following, ultimately may need dialysis for volume control - coreg  continued, imdur  aded - fluid restrict 1.5 liters  # COPD with acute exacerbation Continues to have wheezing. -Scheduled nebs were resumed -Patient started on scheduled inhalers as well - continue steroids, taper   # Acute hypoxic respiratory failure Resolving  # Aspiration # Pleural  effusion Required bipap on arrival, now stable on 2 liters but with wet cough , CXR showing persistent pulmonary edema, nothing focal. Dimer wnl for age so PE less likely. CTA with mild/mod effusions, nothing focal. - s/p thoracentesis 500cc removed from L pleural space. 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 May need dialysis, per palliative patient ok with short-term and patient confirms this. Though made him aware he isn't a great candidate for dialysis. - nephrology folloiwng  - Lokelma  x2 2/4  - continue to hold lisinopril   # MDD - home celexa   # T2DM Glucose elevated with steroids - SSI -Long-acting and mealtime insulin  adjusted  # Hx CVA with residual lef-sided weakness - cont home asa/plavix   # Neuropathy - home gabapentin   # HTN Bp wnl - home lisinopril  on hold  # BPH # History prostate cancer - home flomax   # Debility PT advising snf but family opts for home with home health  # End-of-life care With worsening kidney disease, heart failure, COPD, prior stroke, poor functional status, prognosis is poor. Remains full code full scope. - palliative care consulted today    DVT prophylaxis: lovenox  Code Status: full Family Communication: daughter updated telephonically 2/3 (reports wife is on hospice and is an invalid)  Level of care: Progressive Status is: Inpatient Remains inpatient appropriate because: severity of illness  Consultants:  Cardiology, nephrology   Procedures: none  Antimicrobials:  none    Subjective: Today feels a bit better, bit more energy, breathing a bit better  Objective: Vitals:   06/12/24 1121 06/12/24 1538 06/12/24 1538 06/12/24 1542  BP: (!) 128/46  (!) 127/42   Pulse: (!) 59  60   Resp: 18  18   Temp: 98.7 F (37.1 C)  98 F (36.7 C)   TempSrc:       SpO2: 100% 98% 100% 98%  Weight:      Height:        Intake/Output Summary (Last 24 hours) at 06/12/2024 1748 Last data filed at 06/12/2024 1427 Gross per 24 hour  Intake 1077 ml  Output 1400 ml  Net -323 ml   Filed Weights   06/10/24 0500 06/11/24 0500 06/12/24 0500  Weight: 72.4 kg 74.7 kg 72.4 kg    Examination:  Constitutional: In no distress.  Cardiovascular: Normal rate, regular rhythm. No lower extremity edema  Pulmonary: Non labored breathing on [address], diffuse wheezing   Abdominal: Soft. Non distended and non tender Musculoskeletal: Normal range of motion.     Neurological: Alert and oriented to person, place, and time. Non focal  Skin: Skin is warm and dry.    Data Reviewed: I have personally reviewed following labs and imaging studies  CBC: Recent Labs  Lab 06/06/24 0713 06/07/24 0510 06/10/24 0451  WBC 10.8* 14.1* 14.9*  NEUTROABS 6.3  --   --   HGB 11.5* 11.0* 9.7*  HCT 36.6* 33.5* 30.1*  MCV 96.3 94.4 96.2  PLT 281 248 200   Basic Metabolic Panel: Recent Labs  Lab 06/09/24 0417 06/09/24 1546 06/10/24 0451 06/11/24 0431 06/11/24 2125 06/12/24 0510  NA 136 135 136 135  --  137  K 5.1 5.6* 5.3* 5.7*  --  5.4*  CL 100 100 100 101  --  100  CO2 25 23 23 24   --  24  GLUCOSE 180* 301* 219* 199* 399* 224*  BUN 73* 74* 84* 96*  --  110*  CREATININE 4.66* 4.57* 4.65* 4.85*  --  4.52*  CALCIUM  7.9* 8.0* 7.9* 8.0*  --  8.0*  PHOS  --   --   --   --   --  4.7*   GFR: Estimated Creatinine Clearance: 12.2 mL/min (A) (by C-G formula based on SCr of 4.52 mg/dL (H)). Liver Function Tests: Recent Labs  Lab 06/06/24 0713 06/07/24 0510 06/11/24 0431 06/12/24 0510  AST 18 13* <10*  --   ALT 6 <5 6  --   ALKPHOS 95 83 73  --   BILITOT 0.5 0.4 <0.2  --   PROT 7.0 6.4* 5.1*  --   ALBUMIN 4.0 3.7 3.2* 3.3*   No results for input(s): LIPASE, AMYLASE in the last 168 hours. No results for input(s): AMMONIA in the last 168 hours. Coagulation  Profile: Recent Labs  Lab 06/07/24 0510  INR 1.1   Cardiac Enzymes: No results for input(s): CKTOTAL, CKMB, CKMBINDEX, TROPONINI in the last 168 hours. BNP (last 3 results) Recent Labs    06/06/24 0713  PROBNP 10,471.0*   HbA1C: No results for input(s): HGBA1C in the last 72 hours.  CBG: Recent Labs  Lab 06/11/24 1754 06/11/24 2049 06/12/24 0804 06/12/24 1122 06/12/24 1605  GLUCAP 331* 414* 239* 333* 302*   Lipid Profile: No results for input(s): CHOL, HDL, LDLCALC, TRIG, CHOLHDL, LDLDIRECT in the last 72 hours. Thyroid Function Tests: No results for input(s): TSH,  T4TOTAL, FREET4, T3FREE, THYROIDAB in the last 72 hours. Anemia Panel: No results for input(s): VITAMINB12, FOLATE, FERRITIN, TIBC, IRON, RETICCTPCT in the last 72 hours. Urine analysis:    Component Value Date/Time   COLORURINE STRAW (A) 10/28/2021 0635   APPEARANCEUR CLEAR (A) 10/28/2021 0635   APPEARANCEUR Clear 02/17/2013 0013   LABSPEC 1.006 10/28/2021 0635   LABSPEC 1.029 02/17/2013 0013   PHURINE 5.0 10/28/2021 0635   GLUCOSEU NEGATIVE 10/28/2021 0635   GLUCOSEU >=500 02/17/2013 0013   HGBUR NEGATIVE 10/28/2021 0635   BILIRUBINUR NEGATIVE 10/28/2021 0635   BILIRUBINUR Negative 02/17/2013 0013   KETONESUR NEGATIVE 10/28/2021 0635   PROTEINUR NEGATIVE 10/28/2021 0635   NITRITE NEGATIVE 10/28/2021 0635   LEUKOCYTESUR NEGATIVE 10/28/2021 0635   LEUKOCYTESUR Negative 02/17/2013 0013   Sepsis Labs: @LABRCNTIP (procalcitonin:4,lacticidven:4)  ) Recent Results (from the past 240 hours)  Resp panel by RT-PCR (RSV, Flu A&B, Covid) Anterior Nasal Swab     Status: None   Collection Time: 06/06/24  9:36 AM   Specimen: Anterior Nasal Swab  Result Value Ref Range Status   SARS Coronavirus 2 by RT PCR NEGATIVE NEGATIVE Final    Comment: (NOTE) SARS-CoV-2 target nucleic acids are NOT DETECTED.  The SARS-CoV-2 RNA is generally detectable in upper  respiratory specimens during the acute phase of infection. The lowest concentration of SARS-CoV-2 viral copies this assay can detect is 138 copies/mL. A negative result does not preclude SARS-Cov-2 infection and should not be used as the sole basis for treatment or other patient management decisions. A negative result may occur with  improper specimen collection/handling, submission of specimen other than nasopharyngeal swab, presence of viral mutation(s) within the areas targeted by this assay, and inadequate number of viral copies(<138 copies/mL). A negative result must be combined with clinical observations, patient history, and epidemiological information. The expected result is Negative.  Fact Sheet for Patients:  bloggercourse.com  Fact Sheet for Healthcare Providers:  seriousbroker.it  This test is no t yet approved or cleared by the [place]  FDA and  has been authorized for detection and/or diagnosis of SARS-CoV-2 by FDA under an Emergency Use Authorization (EUA). This EUA will remain  in effect (meaning this test can be used) for the duration of the COVID-19 declaration under Section 564(b)(1) of the Act, 21 U.S.C.section 360bbb-3(b)(1), unless the authorization is terminated  or revoked sooner.       Influenza A by PCR NEGATIVE NEGATIVE Final   Influenza B by PCR NEGATIVE NEGATIVE Final    Comment: (NOTE) The Xpert Xpress SARS-CoV-2/FLU/RSV plus assay is intended as an aid in the diagnosis of influenza from Nasopharyngeal swab specimens and should not be used as a sole basis for treatment. Nasal washings and aspirates are unacceptable for Xpert Xpress SARS-CoV-2/FLU/RSV testing.  Fact Sheet for Patients: bloggercourse.com  Fact Sheet for Healthcare Providers: seriousbroker.it  This test is not yet approved or cleared by the [place]  FDA and has been  authorized for detection and/or diagnosis of SARS-CoV-2 by FDA under an Emergency Use Authorization (EUA). This EUA will remain in effect (meaning this test can be used) for the duration of the COVID-19 declaration under Section 564(b)(1) of the Act, 21 U.S.C. section 360bbb-3(b)(1), unless the authorization is terminated or revoked.     Resp Syncytial Virus by PCR NEGATIVE NEGATIVE Final    Comment: (NOTE) Fact Sheet for Patients: bloggercourse.com  Fact Sheet for Healthcare Providers: seriousbroker.it  This test is not yet approved or cleared by the [place]  FDA and has  been authorized for detection and/or diagnosis of SARS-CoV-2 by FDA under an Emergency Use Authorization (EUA). This EUA will remain in effect (meaning this test can be used) for the duration of the COVID-19 declaration under Section 564(b)(1) of the Act, 21 U.S.C. section 360bbb-3(b)(1), unless the authorization is terminated or revoked.  Performed at [facility], [address] [place], [place] [address]          Radiology Studies: US  THORACENTESIS LEFT ASP PLEURAL SPACE W/IMG GUIDE Result Date: 06/11/2024 INDICATION: 84 year old male with a history of COPD, CHF, CKD 4 who presented to the ED with worsening shortness of breath. Imaging notable for bilateral pleural effusions. Request for diagnostic and therapeutic left-sided thoracentesis. EXAM: ULTRASOUND GUIDED DIAGNOSTIC AND THERAPEUTIC, LEFT-SIDED THORACENTESIS MEDICATIONS: 1% lidocaine , 6 mL. COMPLICATIONS: None immediate. PROCEDURE: An ultrasound guided thoracentesis was thoroughly discussed with the patient and questions answered. The benefits, risks, alternatives and complications were also discussed. The patient understands and wishes to proceed with the procedure. Written consent was obtained. Ultrasound was performed to localize and mark an adequate pocket of fluid in the left chest. The  area was then prepped and draped in the normal sterile fashion. 1% Lidocaine  was used for local anesthesia. Under ultrasound guidance a 6 Fr Safe-T-Centesis catheter was introduced. Thoracentesis was performed. The catheter was removed and a dressing applied. FINDINGS: A total of approximately 500 mL of clear yellow fluid was removed. Samples were sent to the laboratory as requested by the clinical team. IMPRESSION: Successful ultrasound guided left-sided thoracentesis yielding 500 mL of pleural fluid. Procedure performed by: Sherrilee Bal, PA-C and Lexi Roberts, PA-C under the supervision of Dr. VEAR Lent Electronically Signed   By: Wilkie Lent M.D.   On: 06/11/2024 16:03   DG Chest Port 1 View Result Date: 06/11/2024 CLINICAL DATA:  Status post thoracentesis. EXAM: PORTABLE CHEST 1 VIEW COMPARISON:  June 09, 2024 FINDINGS: There is stable dual lead AICD positioning. The heart size and mediastinal contours are within normal limits. Moderate severity calcification of the aortic arch is seen. Mild atelectasis and/or infiltrate is seen within the right upper lobe. There is a very small right pleural effusion. No pneumothorax is identified. Degenerative changes are present throughout the thoracic spine. IMPRESSION: 1. Mild right upper lobe atelectasis and/or infiltrate. 2. Very small right pleural effusion. 3. No evidence of pneumothorax, status post thoracentesis. Electronically Signed   By: Suzen Dials M.D.   On: 06/11/2024 11:32        Scheduled Meds:  aspirin  EC  81 mg Oral Daily   atorvastatin   40 mg Oral QHS   budesonide -glycopyrrolate -formoterol   2 puff Inhalation BID   carvedilol   6.25 mg Oral BID WC   citalopram   20 mg Oral Daily   enoxaparin  (LOVENOX ) injection  30 mg Subcutaneous Q24H   feeding supplement (NEPRO CARB STEADY)  237 mL Oral BID BM   ferrous sulfate   325 mg Oral Q48H   gabapentin   300 mg Oral QHS   hydrALAZINE   10 mg Oral Q8H   insulin  aspart  0-15 Units  Subcutaneous TID WC   insulin  aspart  0-5 Units Subcutaneous QHS   insulin  aspart  7 Units Subcutaneous TID WC   insulin  glargine-yfgn  7 Units Subcutaneous QHS   ipratropium-albuterol   3 mL Nebulization BID   isosorbide  mononitrate  30 mg Oral Daily   pantoprazole   40 mg Oral BID   polyethylene glycol  17 g Oral Daily   predniSONE   20 mg Oral Q breakfast  sodium zirconium cyclosilicate   10 g Oral Q6H   tamsulosin   0.8 mg Oral QHS   traZODone   50 mg Oral QHS   Continuous Infusions:  sodium chloride  40 mL/hr at 06/12/24 1617     LOS: 6 days     Alban Pepper, MD [facility]   If 7PM-7AM, please contact night-coverage [email or web address] Password [facility] 06/12/2024, 5:48 PM     "

## 2024-06-12 NOTE — Inpatient Diabetes Management (Signed)
 Inpatient Diabetes Program Recommendations  AACE/ADA: New Consensus Statement on Inpatient Glycemic Control (2015)  Target Ranges:  Prepandial:   less than 140 mg/dL      Peak postprandial:   less than 180 mg/dL (1-2 hours)      Critically ill patients:  140 - 180 mg/dL    Latest Reference Range & Units 06/11/24 08:47 06/11/24 12:30 06/11/24 17:54 06/11/24 20:49  Glucose-Capillary 70 - 99 mg/dL 829 (H)  8 units Novolog   85  5 units Novolog   331 (H)  16 units Novolog   414 (H)  5 units Novolog   5 units Semglee     Latest Reference Range & Units 06/12/24 08:04 06/12/24 11:22  Glucose-Capillary 70 - 99 mg/dL 760 (H)  13 units Novolog   333 (H)  16 units Novolog    (H): Data is abnormally high     Home DM Meds: Lantus  5 units at bedtime Novoiln R 2-5 units BID with meals   Current Orders: Semglee  5 units at HS Novolog  Moderate Correction Scale/ SSI (0-15 units) TID AC + HS Novolog  5 units TID with meals   MD- Note Prednisone  reduced to 20 mg daily  CBGs remain elevated  Please consider increasing the Novolog  Meal Coverage to 7 units TID with meals    --Will follow patient during hospitalization--  Adina Rudolpho Arrow RN, MSN, CDCES Diabetes Coordinator Inpatient Glycemic Control Team Team Pager: [phone] (8a-5p)

## 2024-06-12 NOTE — Progress Notes (Signed)
 Pt is having multiple episode of choking/coughing after eating and drinking. NP Donati-Garmon made aware. See new orders 1947 and 2246.

## 2024-06-12 NOTE — Plan of Care (Signed)
  Problem: Education: Goal: Knowledge of General Education information will improve Description: Including pain rating scale, medication(s)/side effects and non-pharmacologic comfort measures Outcome: Progressing   Problem: Clinical Measurements: Goal: Respiratory complications will improve Outcome: Progressing   Problem: Clinical Measurements: Goal: Cardiovascular complication will be avoided Outcome: Progressing   Problem: Activity: Goal: Risk for activity intolerance will decrease Outcome: Progressing   Problem: Nutrition: Goal: Adequate nutrition will be maintained Outcome: Progressing   Problem: Elimination: Goal: Will not experience complications related to bowel motility Outcome: Progressing   Problem: Elimination: Goal: Will not experience complications related to urinary retention Outcome: Progressing   Problem: Pain Managment: Goal: General experience of comfort will improve and/or be controlled Outcome: Progressing   Problem: Safety: Goal: Ability to remain free from injury will improve Outcome: Progressing

## 2024-06-12 NOTE — Progress Notes (Addendum)
 " [facility]  ROUNDING NOTE   Subjective:   Cowan Pilar is a 84 y.o.  male with past medical history of diabetes, hypertension, GERD, anemia, COPD, and chronic systolic heart failure.  Patient presents to the emergency department with shortness of breath and has been admitted for CHF (congestive heart failure) (HCC) [I50.9] COPD exacerbation (HCC) [J44.1] Acute on chronic systolic congestive heart failure (HCC) [I50.23] Acute respiratory failure with hypoxia (HCC) [J96.01]  Patient is known to our practice from previous admissions.  He is currently followed by [facility] nephrology.    Update: Patient seen sitting up in bed Awake and alert 2L [place]  Creatinine 4.52 UOP 1.7L  Objective:  Vital signs in last 24 hours:  Temp:  [97.5 F (36.4 C)-98.7 F (37.1 C)] 98.7 F (37.1 C) (02/05 1121) Pulse Rate:  [59-60] 59 (02/05 1121) Resp:  [18-20] 18 (02/05 1121) BP: (118-156)/(46-65) 128/46 (02/05 1121) SpO2:  [96 %-100 %] 100 % (02/05 1121) Weight:  [72.4 kg] 72.4 kg (02/05 0500)  Weight change: -2.3 kg Filed Weights   06/10/24 0500 06/11/24 0500 06/12/24 0500  Weight: 72.4 kg 74.7 kg 72.4 kg    Intake/Output: I/O last 3 completed shifts: In: 1317 [P.O.:1317] Out: 2000 [Urine:2000]   Intake/Output this shift:  Total I/O In: 480 [P.O.:480] Out: 600 [Urine:600]  Physical Exam: General: NAD  Head: Normocephalic, atraumatic. Moist oral mucosal membranes  Eyes: Anicteric  Lungs:  Diminished, normal effort  Heart: Regular rate and rhythm  Abdomen:  Soft, nontender  Extremities:  No peripheral edema.  Neurologic: Awake, alert, conversant  Skin: Warm,dry, no rash  Access: None    Basic Metabolic Panel: Recent Labs  Lab 06/09/24 0417 06/09/24 1546 06/10/24 0451 06/11/24 0431 06/11/24 2125 06/12/24 0510  NA 136 135 136 135  --  137  K 5.1 5.6* 5.3* 5.7*  --  5.4*  CL 100 100 100 101  --  100  CO2 25 23 23 24   --  24  GLUCOSE 180* 301* 219* 199* 399* 224*   BUN 73* 74* 84* 96*  --  110*  CREATININE 4.66* 4.57* 4.65* 4.85*  --  4.52*  CALCIUM  7.9* 8.0* 7.9* 8.0*  --  8.0*  PHOS  --   --   --   --   --  4.7*    Liver Function Tests: Recent Labs  Lab 06/06/24 0713 06/07/24 0510 06/11/24 0431 06/12/24 0510  AST 18 13* <10*  --   ALT 6 <5 6  --   ALKPHOS 95 83 73  --   BILITOT 0.5 0.4 <0.2  --   PROT 7.0 6.4* 5.1*  --   ALBUMIN 4.0 3.7 3.2* 3.3*   No results for input(s): LIPASE, AMYLASE in the last 168 hours. No results for input(s): AMMONIA in the last 168 hours.  CBC: Recent Labs  Lab 06/06/24 0713 06/07/24 0510 06/10/24 0451  WBC 10.8* 14.1* 14.9*  NEUTROABS 6.3  --   --   HGB 11.5* 11.0* 9.7*  HCT 36.6* 33.5* 30.1*  MCV 96.3 94.4 96.2  PLT 281 248 200    Cardiac Enzymes: No results for input(s): CKTOTAL, CKMB, CKMBINDEX, TROPONINI in the last 168 hours.  BNP: Invalid input(s): POCBNP  CBG: Recent Labs  Lab 06/11/24 1230 06/11/24 1754 06/11/24 2049 06/12/24 0804 06/12/24 1122  GLUCAP 85 331* 414* 239* 333*    Microbiology: Results for orders placed or performed during the hospital encounter of 06/06/24  Resp panel by RT-PCR (RSV, Flu  A&B, Covid) Anterior Nasal Swab     Status: None   Collection Time: 06/06/24  9:36 AM   Specimen: Anterior Nasal Swab  Result Value Ref Range Status   SARS Coronavirus 2 by RT PCR NEGATIVE NEGATIVE Final    Comment: (NOTE) SARS-CoV-2 target nucleic acids are NOT DETECTED.  The SARS-CoV-2 RNA is generally detectable in upper respiratory specimens during the acute phase of infection. The lowest concentration of SARS-CoV-2 viral copies this assay can detect is 138 copies/mL. A negative result does not preclude SARS-Cov-2 infection and should not be used as the sole basis for treatment or other patient management decisions. A negative result may occur with  improper specimen collection/handling, submission of specimen other than nasopharyngeal swab,  presence of viral mutation(s) within the areas targeted by this assay, and inadequate number of viral copies(<138 copies/mL). A negative result must be combined with clinical observations, patient history, and epidemiological information. The expected result is Negative.  Fact Sheet for Patients:  bloggercourse.com  Fact Sheet for Healthcare Providers:  seriousbroker.it  This test is no t yet approved or cleared by the [place]  FDA and  has been authorized for detection and/or diagnosis of SARS-CoV-2 by FDA under an Emergency Use Authorization (EUA). This EUA will remain  in effect (meaning this test can be used) for the duration of the COVID-19 declaration under Section 564(b)(1) of the Act, 21 U.S.C.section 360bbb-3(b)(1), unless the authorization is terminated  or revoked sooner.       Influenza A by PCR NEGATIVE NEGATIVE Final   Influenza B by PCR NEGATIVE NEGATIVE Final    Comment: (NOTE) The Xpert Xpress SARS-CoV-2/FLU/RSV plus assay is intended as an aid in the diagnosis of influenza from Nasopharyngeal swab specimens and should not be used as a sole basis for treatment. Nasal washings and aspirates are unacceptable for Xpert Xpress SARS-CoV-2/FLU/RSV testing.  Fact Sheet for Patients: bloggercourse.com  Fact Sheet for Healthcare Providers: seriousbroker.it  This test is not yet approved or cleared by the [place]  FDA and has been authorized for detection and/or diagnosis of SARS-CoV-2 by FDA under an Emergency Use Authorization (EUA). This EUA will remain in effect (meaning this test can be used) for the duration of the COVID-19 declaration under Section 564(b)(1) of the Act, 21 U.S.C. section 360bbb-3(b)(1), unless the authorization is terminated or revoked.     Resp Syncytial Virus by PCR NEGATIVE NEGATIVE Final    Comment: (NOTE) Fact Sheet for  Patients: bloggercourse.com  Fact Sheet for Healthcare Providers: seriousbroker.it  This test is not yet approved or cleared by the [place]  FDA and has been authorized for detection and/or diagnosis of SARS-CoV-2 by FDA under an Emergency Use Authorization (EUA). This EUA will remain in effect (meaning this test can be used) for the duration of the COVID-19 declaration under Section 564(b)(1) of the Act, 21 U.S.C. section 360bbb-3(b)(1), unless the authorization is terminated or revoked.  Performed at [facility], [address] [place], [place] [address]     Coagulation Studies: No results for input(s): LABPROT, INR in the last 72 hours.  Urinalysis: No results for input(s): COLORURINE, LABSPEC, PHURINE, GLUCOSEU, HGBUR, BILIRUBINUR, KETONESUR, PROTEINUR, UROBILINOGEN, NITRITE, LEUKOCYTESUR in the last 72 hours.  Invalid input(s): APPERANCEUR    Imaging: US  THORACENTESIS LEFT ASP PLEURAL SPACE W/IMG GUIDE Result Date: 06/11/2024 INDICATION: 84 year old male with a history of COPD, CHF, CKD 4 who presented to the ED with worsening shortness of breath. Imaging notable for bilateral pleural effusions. Request for diagnostic  and therapeutic left-sided thoracentesis. EXAM: ULTRASOUND GUIDED DIAGNOSTIC AND THERAPEUTIC, LEFT-SIDED THORACENTESIS MEDICATIONS: 1% lidocaine , 6 mL. COMPLICATIONS: None immediate. PROCEDURE: An ultrasound guided thoracentesis was thoroughly discussed with the patient and questions answered. The benefits, risks, alternatives and complications were also discussed. The patient understands and wishes to proceed with the procedure. Written consent was obtained. Ultrasound was performed to localize and mark an adequate pocket of fluid in the left chest. The area was then prepped and draped in the normal sterile fashion. 1% Lidocaine  was used for local anesthesia. Under ultrasound  guidance a 6 Fr Safe-T-Centesis catheter was introduced. Thoracentesis was performed. The catheter was removed and a dressing applied. FINDINGS: A total of approximately 500 mL of clear yellow fluid was removed. Samples were sent to the laboratory as requested by the clinical team. IMPRESSION: Successful ultrasound guided left-sided thoracentesis yielding 500 mL of pleural fluid. Procedure performed by: Sherrilee Bal, PA-C and Lexi Roberts, PA-C under the supervision of Dr. VEAR Lent Electronically Signed   By: Wilkie Lent M.D.   On: 06/11/2024 16:03   DG Chest Port 1 View Result Date: 06/11/2024 CLINICAL DATA:  Status post thoracentesis. EXAM: PORTABLE CHEST 1 VIEW COMPARISON:  June 09, 2024 FINDINGS: There is stable dual lead AICD positioning. The heart size and mediastinal contours are within normal limits. Moderate severity calcification of the aortic arch is seen. Mild atelectasis and/or infiltrate is seen within the right upper lobe. There is a very small right pleural effusion. No pneumothorax is identified. Degenerative changes are present throughout the thoracic spine. IMPRESSION: 1. Mild right upper lobe atelectasis and/or infiltrate. 2. Very small right pleural effusion. 3. No evidence of pneumothorax, status post thoracentesis. Electronically Signed   By: Suzen Dials M.D.   On: 06/11/2024 11:32     Medications:     aspirin  EC  81 mg Oral Daily   atorvastatin   40 mg Oral QHS   budesonide -glycopyrrolate -formoterol   2 puff Inhalation BID   carvedilol   6.25 mg Oral BID WC   citalopram   20 mg Oral Daily   enoxaparin  (LOVENOX ) injection  30 mg Subcutaneous Q24H   ferrous sulfate   325 mg Oral Q48H   gabapentin   300 mg Oral QHS   hydrALAZINE   10 mg Oral Q8H   insulin  aspart  0-15 Units Subcutaneous TID WC   insulin  aspart  0-5 Units Subcutaneous QHS   insulin  aspart  5 Units Subcutaneous TID WC   insulin  glargine-yfgn  5 Units Subcutaneous QHS   ipratropium-albuterol   3  mL Nebulization BID   isosorbide  mononitrate  30 mg Oral Daily   pantoprazole   40 mg Oral BID   polyethylene glycol  17 g Oral Daily   predniSONE   20 mg Oral Q breakfast   tamsulosin   0.8 mg Oral QHS   traZODone   50 mg Oral QHS   acetaminophen  **OR** acetaminophen , ondansetron  **OR** ondansetron  (ZOFRAN ) IV, mouth rinse  Assessment/ Plan:  Mr. Tabor Denham is a 84 y.o.  male with past medical history of diabetes, hypertension, anemia, COPD, GERD and chronic systolic heart failure. 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  Acute kidney injury secondary to cardiorenal vs progression of kidney disease. Renal US  negative for obstruction.  Creatinine slightly improved today. Has produced more urine. Patient continues to be agreeable to dialysis if needed. Will order gentle IV hydration, NS 0.9% at 40ml/hr. Will also order Nepro twice daily. Will monitor labs in urine output in am and decide. Would encourage continued conversations with Palliative care.  Lab Results  Component Value Date   CREATININE 4.52 (H) 06/12/2024   CREATININE 4.85 (H) 06/11/2024   CREATININE 4.65 (H) 06/10/2024    Intake/Output Summary (Last 24 hours) at 06/12/2024 1430 Last data filed at 06/12/2024 1427 Gross per 24 hour  Intake 1077 ml  Output 2300 ml  Net -1223 ml   2. Hyperkalemia, likely secondary to kidney failure. Potassium 5.4 today. Will reorder Lokelma  10 g x 2.   3. Anemia of chronic kidney disease Lab Results  Component Value Date   HGB 9.7 (L) 06/10/2024    Hgb at goal. Will continue to monitor.   4. Chronic systolic heart failure, TTE shows EF 40-45% with mild-mod  aoritc stenosis. Diuresis remains held due to kidney injury.    LOS: 6 Ayjah Show 2/5/20262:30 PM   "

## 2024-06-12 NOTE — Progress Notes (Signed)
 "  Rounding Note   Patient Name: Phillip Reyes Date of Encounter: 06/12/2024  [name] Cardiologist: new to [facility]  Subjective Sitting up in bed, reports he had breakfast Reports significant wheezing Breathing felt better after his thoracentesis yesterday -Maintaining paced rhyth  Scheduled Meds:  aspirin  EC  81 mg Oral Daily   atorvastatin   40 mg Oral QHS   budesonide -glycopyrrolate -formoterol   2 puff Inhalation BID   carvedilol   6.25 mg Oral BID WC   citalopram   20 mg Oral Daily   enoxaparin  (LOVENOX ) injection  30 mg Subcutaneous Q24H   feeding supplement (NEPRO CARB STEADY)  237 mL Oral BID BM   ferrous sulfate   325 mg Oral Q48H   gabapentin   300 mg Oral QHS   hydrALAZINE   10 mg Oral Q8H   insulin  aspart  0-15 Units Subcutaneous TID WC   insulin  aspart  0-5 Units Subcutaneous QHS   insulin  aspart  7 Units Subcutaneous TID WC   insulin  glargine-yfgn  7 Units Subcutaneous QHS   ipratropium-albuterol   3 mL Nebulization BID   isosorbide  mononitrate  30 mg Oral Daily   pantoprazole   40 mg Oral BID   polyethylene glycol  17 g Oral Daily   predniSONE   20 mg Oral Q breakfast   sodium zirconium cyclosilicate   10 g Oral Q6H   tamsulosin   0.8 mg Oral QHS   traZODone   50 mg Oral QHS   Continuous Infusions:  sodium chloride      PRN Meds: acetaminophen  **OR** acetaminophen , ondansetron  **OR** ondansetron  (ZOFRAN ) IV, mouth rinse   Vital Signs  Vitals:   06/12/24 0500 06/12/24 0527 06/12/24 0800 06/12/24 1121  BP:  (!) 140/65 (!) 156/56 (!) 128/46  Pulse:   (!) 59 (!) 59  Resp:  18  18  Temp:  97.6 F (36.4 C) 98.1 F (36.7 C) 98.7 F (37.1 C)  TempSrc:  Oral Oral   SpO2:  100% 100% 100%  Weight: 72.4 kg     Height:        Intake/Output Summary (Last 24 hours) at 06/12/2024 1534 Last data filed at 06/12/2024 1427 Gross per 24 hour  Intake 1077 ml  Output 2300 ml  Net -1223 ml      06/12/2024    5:00 AM 06/11/2024    5:00 AM 06/10/2024    5:00 AM   Last 3 Weights  Weight (lbs) 159 lb 9.8 oz 164 lb 10.9 oz 159 lb 9.8 oz  Weight (kg) 72.4 kg 74.7 kg 72.4 kg      Telemetry Paced rhythm- Personally Reviewed  ECG   - Personally Reviewed  Physical Exam  GEN: No acute distress.   Neck: No JVD Cardiac: RRR, no murmurs, rubs, or gallops.  Respiratory: Wheezing GI: Soft, nontender, non-distended  MS: No edema; No deformity. Neuro:  Nonfocal  Psych: Normal affect   Labs High Sensitivity Troponin:  No results for input(s): TROPONINIHS in the last 720 hours.  Recent Labs  Lab 06/06/24 0713 06/06/24 0936  TRNPT 34* 32*       Chemistry Recent Labs  Lab 06/06/24 0713 06/07/24 0510 06/08/24 0430 06/10/24 0451 06/11/24 0431 06/11/24 2125 06/12/24 0510  NA 135 139   < > 136 135  --  137  K 4.4 4.2   < > 5.3* 5.7*  --  5.4*  CL 102 102   < > 100 101  --  100  CO2 22 23   < > 23 24  --  24  GLUCOSE 210* 213*   < > 219* 199* 399* 224*  BUN 35* 40*   < > 84* 96*  --  110*  CREATININE 3.44* 3.62*   < > 4.65* 4.85*  --  4.52*  CALCIUM  8.6* 8.9   < > 7.9* 8.0*  --  8.0*  PROT 7.0 6.4*  --   --  5.1*  --   --   ALBUMIN 4.0 3.7  --   --  3.2*  --  3.3*  AST 18 13*  --   --  <10*  --   --   ALT 6 <5  --   --  6  --   --   ALKPHOS 95 83  --   --  73  --   --   BILITOT 0.5 0.4  --   --  <0.2  --   --   GFRNONAA 17* 16*   < > 12* 11*  --  12*  ANIONGAP 11 15   < > 12 11  --  12   < > = values in this interval not displayed.    Lipids No results for input(s): CHOL, TRIG, HDL, LABVLDL, LDLCALC, CHOLHDL in the last 168 hours.  Hematology Recent Labs  Lab 06/06/24 0713 06/07/24 0510 06/10/24 0451  WBC 10.8* 14.1* 14.9*  RBC 3.80* 3.55* 3.13*  HGB 11.5* 11.0* 9.7*  HCT 36.6* 33.5* 30.1*  MCV 96.3 94.4 96.2  MCH 30.3 31.0 31.0  MCHC 31.4 32.8 32.2  RDW 14.2 14.1 14.3  PLT 281 248 200   Thyroid No results for input(s): TSH, FREET4 in the last 168 hours.  BNP Recent Labs  Lab 06/06/24 0713  PROBNP  10,471.0*    DDimer  Recent Labs  Lab 06/07/24 1619  DDIMER 0.77*     Radiology  US  THORACENTESIS LEFT ASP PLEURAL SPACE W/IMG GUIDE Result Date: 06/11/2024 INDICATION: 84 year old male with a history of COPD, CHF, CKD 4 who presented to the ED with worsening shortness of breath. Imaging notable for bilateral pleural effusions. Request for diagnostic and therapeutic left-sided thoracentesis. EXAM: ULTRASOUND GUIDED DIAGNOSTIC AND THERAPEUTIC, LEFT-SIDED THORACENTESIS MEDICATIONS: 1% lidocaine , 6 mL. COMPLICATIONS: None immediate. PROCEDURE: An ultrasound guided thoracentesis was thoroughly discussed with the patient and questions answered. The benefits, risks, alternatives and complications were also discussed. The patient understands and wishes to proceed with the procedure. Written consent was obtained. Ultrasound was performed to localize and mark an adequate pocket of fluid in the left chest. The area was then prepped and draped in the normal sterile fashion. 1% Lidocaine  was used for local anesthesia. Under ultrasound guidance a 6 Fr Safe-T-Centesis catheter was introduced. Thoracentesis was performed. The catheter was removed and a dressing applied. FINDINGS: A total of approximately 500 mL of clear yellow fluid was removed. Samples were sent to the laboratory as requested by the clinical team. IMPRESSION: Successful ultrasound guided left-sided thoracentesis yielding 500 mL of pleural fluid. Procedure performed by: Sherrilee Bal, PA-C and Lexi Roberts, PA-C under the supervision of Dr. VEAR Lent Electronically Signed   By: Wilkie Lent M.D.   On: 06/11/2024 16:03   DG Chest Port 1 View Result Date: 06/11/2024 CLINICAL DATA:  Status post thoracentesis. EXAM: PORTABLE CHEST 1 VIEW COMPARISON:  June 09, 2024 FINDINGS: There is stable dual lead AICD positioning. The heart size and mediastinal contours are within normal limits. Moderate severity calcification of the aortic arch is seen.  Mild atelectasis and/or infiltrate is seen within the right upper  lobe. There is a very small right pleural effusion. No pneumothorax is identified. Degenerative changes are present throughout the thoracic spine. IMPRESSION: 1. Mild right upper lobe atelectasis and/or infiltrate. 2. Very small right pleural effusion. 3. No evidence of pneumothorax, status post thoracentesis. Electronically Signed   By: Suzen Dials M.D.   On: 06/11/2024 11:32    Cardiac Studies   Patient Profile   84 y.o. male  with a hx of CHF, bradycardia s/p ICD, CAD, COPD, hypertension, hyperlipidemia, type 2 diabetes, GERD, and CKD who was admitted 1/30 with dyspnea, found to have acute on chronic CHF and COPD, who is being seen for the management of CHF.   Assessment & Plan  Acute on chronic systolic CHF -Depressed ejection fraction dating back to 2021 EF 35% at that time - Echocardiogram this admission with EF 40 to 45% - Presenting with respiratory distress, worsening renal failure, pleural effusions, diuresis limited by acute on chronic renal failure  - On carvedilol  6.25 twice daily Imdur  30 daily hydralazine  10 mg 3 times daily    Acute on chronic hypoxic respiratory failure COPD exacerbation Initially required CPAP, after thoracentesis tolerating nasal cannula oxygen -Significant wheezing on exam -Diuretic on hold in the setting of worsening renal failure - Palliative following   Acute on chronic chronic kidney disease BUN/creatinine worse this admission with diuresis -Diuretic on hold Nephrology following, few options -Palliative following   Essential hypertension On isosorbide , carvedilol , hydralazine  -Blood pressure 120 up to 150 systolic   Mild to moderate aortic valve stenosis Not a major clinical issue at this time  Dispo Outlook is poor in the setting of acute on chronic renal failure not amenable to diuresis or dialysis  [facility] will sign off.   Medication  Recommendations: No further medication changes Other recommendations (labs, testing, etc): No further testing at this time Follow up as an outpatient: Outpatient follow-up in clinic   For questions or updates, please contact [facility] Please consult [email or web address] for contact info under     Signed, Tieasha Larsen, MD  06/12/2024, 3:34 PM    "

## 2024-06-12 NOTE — Progress Notes (Signed)
"  ° °      Overnight   NAME: Phillip Reyes MRN: [id number] DOB : 24-Sep-1940    Date of Service   06/12/2024   HPI/Events of Note   HPI:  84 y.o. male with medical history significant for COPD not on home oxygen, CHF, DM, HTN,HLD,  GERD, CKD 4 who came into [facility] ED via EMS for shortness of breath.  Patient was started on CPAP for room air saturation of 88% with neb treatment by EMS.  Patient also complained of lower abdominal pain radiating to back, Solu-Medrol  125 mg IV, DuoNeb, 2 g of magnesium  given by EMS.  EMS vitals 243/94, heart rate 62.  Patient stated that he had shortness of breath starting yesterday and he was not able to breathe today.  EMS was called.  He denied any fever, chills, nausea, vomiting, chest pain, palpitations, cough, hemoptysis, hematemesis or melena.      Overnight: Notified by RN of shortness of breath.  RN reports RT is at bedside administering DuoNeb.  RN reports concern for aspiration given difficulty swallowing, reports he is choking/coughing when swallowing.  Patient made n.p.o. speech evaluation placed and chest ray x-ray ordered.  On evaluation patient reports improvement of symptoms post DuoNeb agrees to remaining n.p.o. until speech evaluation is complete. Afebrile with leukocytosis.   Physical Exam Vitals and nursing note reviewed.  HENT:     Mouth/Throat:     Mouth: Mucous membranes are dry.  Eyes:     Pupils: Pupils are equal, round, and reactive to light.  Cardiovascular:     Rate and Rhythm: Normal rate and regular rhythm.     Pulses:          Radial pulses are 2+ on the right side and 2+ on the left side.     Heart sounds: S1 normal and S2 normal.  Pulmonary:     Breath sounds: Examination of the right-upper field reveals wheezing. Examination of the right-middle field reveals wheezing and rhonchi. Examination of the right-lower field reveals rhonchi. Wheezing and rhonchi present.  Abdominal:     General: Abdomen is flat. Bowel sounds are normal.      Palpations: Abdomen is soft.  Musculoskeletal:        General: No swelling.  Skin:    General: Skin is warm and dry.     Capillary Refill: Capillary refill takes 2 to 3 seconds.  Neurological:     General: No focal deficit present.     Mental Status: He is alert and oriented to person, place, and time.     GCS: GCS eye subscore is 4. GCS verbal subscore is 5. GCS motor subscore is 6.       Interventions/ Plan   N.p.o. Speech evaluation Chest x-ray IMPRESSION: Diffuse interstitial coarsening is accentuated by hypoinflation Superimposed interstitial edema or pneumonia are difficult to exclude.Probable small bilateral pleural effusions.- recommend considering antibiotics for potential aspirational pneumonia given leukocytosis.     Updates     Burlie Cajamarca Donati- Aram BSN RN CCRN AGACNP-BC Acute Care Nurse Practitioner [facility] [place]  "

## 2024-06-12 NOTE — Progress Notes (Signed)
 Physical Therapy Treatment Patient Details Name: Phillip Reyes MRN: [id number] DOB: 1941-04-23 Today's Date: 06/12/2024   History of Present Illness Phillip Reyes is a pleasant 84 y.o. male with medical history significant for COPD not on home oxygen, CHF, DM, HTN,HLD,  GERD, CKD 4 who came into [facility] ED via EMS for shortness of breath.    PT Comments  Pt requiring minA for bed mobility and transfers, minGuard for AMB at bedside with RW. All transition are somewhat slow and rigid, but no gross LOB in session, no notable pain limitations. Pt able to complete entire session on room air ending at 98%. Pt in bed at EOS with NA at bedside.    If plan is discharge home, recommend the following: A lot of help with walking and/or transfers;A lot of help with bathing/dressing/bathroom;Assistance with cooking/housework   Can travel by private vehicle     No  Equipment Recommendations  None recommended by PT    Recommendations for Other Services       Precautions / Restrictions Precautions Precautions: Fall Recall of Precautions/Restrictions: Intact Restrictions Weight Bearing Restrictions Per Provider Order: No     Mobility  Bed Mobility Overal bed mobility: Needs Assistance Bed Mobility: Supine to Sit, Sit to Supine     Supine to sit: Min assist Sit to supine: Min assist        Transfers Overall transfer level: Needs assistance Equipment used: Rolling walker (2 wheels) Transfers: Sit to/from Stand Sit to Stand: Min assist           General transfer comment: min Guard from elevated surface    Ambulation/Gait Ambulation/Gait assistance: Contact guard assist Gait Distance (Feet): 18 Feet Assistive device: Rolling walker (2 wheels) Gait Pattern/deviations: Step-to pattern       General Gait Details: slow AMB alternating to window forward and backward to EOB x3; room air, no LOB, 98% after effort.   Stairs             Wheelchair Mobility     Tilt Bed     Modified Rankin (Stroke Patients Only)       Horticulturist, Commercial    Exercises      General Comments        Pertinent Vitals/Pain      Home Living                          Prior Function            PT Goals (current goals can now be found in the care plan section) Acute Rehab PT Goals Patient Stated Goal: improve PT Goal Formulation: With patient Time For Goal Achievement: 06/20/24 Potential to Achieve Goals: Fair Progress towards PT goals: Progressing toward goals    Frequency    Min 2X/week      PT Plan      Co-evaluation              AM-PAC PT 6 Clicks Mobility  Outcome Measure  Help needed turning from your back to your side while in a flat bed without using bedrails?: A Lot Help needed moving from lying on your back to sitting on the side of a flat bed without using bedrails?: A Lot Help needed moving to and from a bed to a chair (including a wheelchair)?: A Lot Help needed standing up from a chair using your arms (e.g., wheelchair or bedside chair)?: A Lot Help needed to walk in hospital room?: A Little Help needed climbing 3-5 steps with a railing? : A Lot 6 Click Score: 13    End of Session Equipment Utilized During Treatment: Oxygen;Gait belt Activity Tolerance: Patient limited by fatigue;Patient tolerated treatment well Patient left: in bed;with call bell/phone within reach;with nursing/sitter in room Nurse Communication: Mobility status PT Visit Diagnosis: Other abnormalities of gait and mobility (R26.89);Muscle weakness (generalized) (M62.81);Difficulty in walking, not elsewhere classified (R26.2);Unsteadiness on feet (R26.81)     Time: 8484-8462 PT Time Calculation (min) (ACUTE ONLY): 22 min  Charges:    $Therapeutic Activity: 8-22 mins PT General Charges $$ ACUTE PT  VISIT: 1 Visit                    3:48 PM, 06/12/24 [name], PT, DPT Physical Therapist - [facility]  [phone] (ASCOM)    Daimian Sudberry C 06/12/2024, 3:45 PM

## 2024-06-13 LAB — BASIC METABOLIC PANEL WITH GFR
Anion gap: 13 (ref 5–15)
Anion gap: 12 (ref 5–15)
BUN: 112 mg/dL — ABNORMAL HIGH (ref 8–23)
BUN: 112 mg/dL — ABNORMAL HIGH (ref 8–23)
CO2: 22 mmol/L (ref 22–32)
CO2: 23 mmol/L (ref 22–32)
Calcium: 7.7 mg/dL — ABNORMAL LOW (ref 8.9–10.3)
Calcium: 7.6 mg/dL — ABNORMAL LOW (ref 8.9–10.3)
Chloride: 99 mmol/L (ref 98–111)
Chloride: 100 mmol/L (ref 98–111)
Creatinine, Ser: 4.6 mg/dL — ABNORMAL HIGH (ref 0.61–1.24)
Creatinine, Ser: 4.43 mg/dL — ABNORMAL HIGH (ref 0.61–1.24)
GFR, Estimated: 12 mL/min — ABNORMAL LOW
GFR, Estimated: 13 mL/min — ABNORMAL LOW
Glucose, Bld: 290 mg/dL — ABNORMAL HIGH (ref 70–99)
Glucose, Bld: 206 mg/dL — ABNORMAL HIGH (ref 70–99)
Potassium: 4.9 mmol/L (ref 3.5–5.1)
Potassium: 5.1 mmol/L (ref 3.5–5.1)
Sodium: 134 mmol/L — ABNORMAL LOW (ref 135–145)
Sodium: 135 mmol/L (ref 135–145)

## 2024-06-13 LAB — GLUCOSE, CAPILLARY
Glucose-Capillary: 178 mg/dL — ABNORMAL HIGH (ref 70–99)
Glucose-Capillary: 146 mg/dL — ABNORMAL HIGH (ref 70–99)
Glucose-Capillary: 144 mg/dL — ABNORMAL HIGH (ref 70–99)
Glucose-Capillary: 246 mg/dL — ABNORMAL HIGH (ref 70–99)

## 2024-06-13 LAB — BLOOD GAS, VENOUS
Acid-base deficit: 1.1 mmol/L (ref 0.0–2.0)
Bicarbonate: 24.8 mmol/L (ref 20.0–28.0)
O2 Saturation: 90.2 %
Patient temperature: 37
pCO2, Ven: 45 mmHg (ref 44–60)
pH, Ven: 7.35 (ref 7.25–7.43)
pO2, Ven: 55 mmHg — ABNORMAL HIGH (ref 32–45)

## 2024-06-13 LAB — CBC
HCT: 28.4 % — ABNORMAL LOW (ref 39.0–52.0)
Hemoglobin: 9.3 g/dL — ABNORMAL LOW (ref 13.0–17.0)
MCH: 31.3 pg (ref 26.0–34.0)
MCHC: 32.7 g/dL (ref 30.0–36.0)
MCV: 95.6 fL (ref 80.0–100.0)
Platelets: 198 10*3/uL (ref 150–400)
RBC: 2.97 MIL/uL — ABNORMAL LOW (ref 4.22–5.81)
RDW: 14.3 % (ref 11.5–15.5)
WBC: 16.1 10*3/uL — ABNORMAL HIGH (ref 4.0–10.5)
nRBC: 0 % (ref 0.0–0.2)

## 2024-06-13 LAB — PRO BRAIN NATRIURETIC PEPTIDE: Pro Brain Natriuretic Peptide: 3909 pg/mL — ABNORMAL HIGH

## 2024-06-13 MED ORDER — ADULT MULTIVITAMIN W/MINERALS CH: 1.0000 | ORAL_TABLET | Freq: Every day | ORAL | Status: AC

## 2024-06-13 NOTE — Plan of Care (Signed)
" °  Problem: Education: Goal: Knowledge of General Education information will improve Description: Including pain rating scale, medication(s)/side effects and non-pharmacologic comfort measures 06/13/2024 0231 by Cathyann Ceola HERO, RN Outcome: Progressing 06/13/2024 0201 by Cathyann Ceola HERO, RN Outcome: Progressing   Problem: Clinical Measurements: Goal: Respiratory complications will improve 06/13/2024 0231 by Cathyann Ceola HERO, RN Outcome: Progressing 06/13/2024 0201 by Cathyann Ceola HERO, RN Outcome: Progressing   Problem: Clinical Measurements: Goal: Cardiovascular complication will be avoided 06/13/2024 0231 by Cathyann Ceola HERO, RN Outcome: Progressing 06/13/2024 0201 by Cathyann Ceola HERO, RN Outcome: Progressing   Problem: Nutrition: Goal: Adequate nutrition will be maintained 06/13/2024 0231 by Cathyann Ceola HERO, RN Outcome: Not Met (add Reason) 06/13/2024 0201 by Cathyann Ceola HERO, RN Outcome: Not Met (add Reason) Note: Pt is coughing every    Problem: Coping: Goal: Level of anxiety will decrease Outcome: Not Met (add Reason) Note: Pt cough every time eat and drink. Pt placed NPO and needs speech re-evaluation   Problem: Pain Managment: Goal: General experience of comfort will improve and/or be controlled Outcome: Progressing   Problem: Safety: Goal: Ability to remain free from injury will improve Outcome: Progressing   "

## 2024-06-13 NOTE — Progress Notes (Signed)
 Speech Language Pathology Treatment: Dysphagia  Patient Details Name: Phillip Reyes MRN: [id number] DOB: 1941-02-02 Today's Date: 06/13/2024 Time: 8844-8754 SLP Time Calculation (min) (ACUTE ONLY): 50 min  Assessment / Plan / Recommendation Clinical Impression  Pt seen for ongoing assessment of swallowing and toleration of diet. NSG reported that pt had declined Pulmonary presentation yesterday which appeared to impact his ability to safely eat/drink- coughing observed intermittently.   At this session today, pt's Respiratory status was calm and unlabored; no crackly noises noted during breathing at Rest as were noted yesterday. MD present stated pt's breathing txs were resumed.  Pt on RA, afebrile.  Pt was awake, verbal and enjoyed having good conversation w/ this SLP. He is full of humor and insight. Pt fed himself w/ a little setup support; lights on and sitting up. Pt provided Proper Upright Sitting Support w/ Pillow low behind back. Dentures cleaned and placed. Pt uses a CUP to drink Baseline.   Pt appears to present w/ functional oropharyngeal phase swallow w/ No oropharyngeal phase dysphagia noted, No neuromuscular deficits noted. Pt consumed po trials w/ No overt, clinical s/s of aspiration during po trials. Pt appears at reduced risk for aspiration following general aspiration precautions. However, pt does have challenging factors that could impact oropharyngeal swallowing to include: suspected impact from declined Pulmonary stauts(COPD, CHF); Deconditioning; GERD; need for setup/sitting up Positioning; Dentures; need for Conservation of Energy strategies. These factors can increase risk for dysphagia as well as decreased oral intake overall.    During po trials, pt consumed all consistencies w/ no overt coughing, decline in vocal quality, or change in respiratory presentation during/post trials. O2 sats were 97% during when checked. Oral phase appeared [facility] w/ timely bolus management,  mastication, and control of bolus propulsion for A-P transfer for swallowing. Oral clearing achieved w/ all trial consistencies -- moistened, soft foods given. Pt fed self w/ setup support. Respiratory presentation remained Calm during- pt was encouraged to take Rest Breaks intermittently for conservation of energy and to maintain calm breathing. NO STRAWS were used.    Recommend continue a fairly regular consistency diet w/ well-Cut/Chopped meats, moistened foods(mech soft in the hospital); Thin liquids -- Cup drinking ONLY. Recommend general aspiration precautions including small sips/bites Slowly; Rest Breaks during meals. Conservation of energy practices including Less Talking at meals and choosing foods easy to chew/eat. Pills WHOLE in Puree for safer, easier swallowing -- pt has been doing this and it was encouraged now and for D/C to the pt.  Education given on Pills in Puree; food consistencies, Prep, and easy to eat options; conservation of energy practices; general aspiration precautions to pt.  No further skilled ST services indicated. MD/NSG to reconsult if any new needs arise while admitted. NSG updated, agreed. MD updated. Recommend Dietician f/u for support. Precautions posted in room, chart.       HPI HPI: Pt is a pleasant 84 y.o. 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  Patient was started on CPAP for room air saturation of 88% with neb treatment by EMS.  Patient also complained of lower abdominal pain radiating to back, Solu-Medrol  125 mg IV, DuoNeb, 2 g of magnesium  given by EMS.  EMS vitals 243/94, heart rate 62.  Patient stated that he had shortness of breath starting yesterday and he was not able to breathe today.  EMS was called.  He  denied any fever, chills, nausea, vomiting, chest pain, palpitations, cough, hemoptysis, hematemesis or melena.  Hospitalist service was  consulted for evaluation for admission for acute hypoxemic respiratory failure likely due to COPD and CHF.  Chest Imaging at admit/today have revealed: Unchanged small LEFT pleural effusion. Scattered BILATERAL lung  opacities, greatest at the bases, favored to be due to combination  of atelectasis and pulmonary edema.  No significant interval change in appearance of the chest with  findings most consistent with CHF/fluid volume overload..  Pt had a day of increased crackly breathing; MD stated the breathing txs had been stopped but were now resumed today.      SLP Plan  All goals met        Swallow Evaluation Recommendations   Recommendations: PO diet PO Diet Recommendation: Dysphagia 3 (Mechanical soft);Thin liquids (Level 0) (gravies to moisten) Liquid Administration via: Cup;No straw Medication Administration: Whole meds with puree Supervision: Patient able to self-feed;Intermittent supervision/cueing for swallowing strategies;Set-up assistance for safety Postural changes: Position pt fully upright for meals;Stay upright 30-60 min after meals;Out of bed for meals Oral care recommendations: Oral care BID (2x/day);Pt independent with oral care;Staff/trained caregiver to provide oral care (setup and Denture care) Recommended consults: Consider dietitian consultation;Consider Palliative care (Pulmonary for incentive spirometer, exs.)     Recommendations   See above                 (Dietician support) Oral care BID;Oral care before and after PO;Staff/trained caregiver to provide oral care (Denture care)   Intermittent Supervision/Assistance Dysphagia, unspecified (R13.10) (suspected impact from declined Pulmonary stauts(COPD, CHF); Deconditioning; need for setup/sitting up Positioning; Dentures; need for Conservation of Energy strategies)     All goals met        Phillip Portugal, MS, CCC-SLP Speech Language Pathologist Rehab Services; [facility] [phone] (ascom) Phillip Reyes  06/13/2024, 5:50 PM

## 2024-06-13 NOTE — Progress Notes (Signed)
 "                                                                                                                                                                                                          Daily Progress Note   Patient Name: Phillip Reyes       Date: 06/13/2024 DOB: [birth year]  Age: 84 y.o. MRN#: [id number] Attending Physician: Franchot Novel, MD Primary Care Physician: [facility] Admit Date: 06/06/2024  Reason for Consultation/Follow-up: Establishing goals of care  Subjective: Notes and labs reviewed.  And to see patient.  He is currently sitting in bed at this time.  He smiles and greets me and states he is feeling better.  We revisited previous discussion regarding limits to care.  He states he has decided that he would never want CPR.  He states he is also decided that he would never want to be put on ventilator support, not even short-term.  He states he would never want a feeding tube.  He advises he would be amenable to temporary dialysis if truly needed.  He states his stepdaughter Phillip Reyes is aware of this.  I completed a MOST form today and the signed original was placed in the chart. Each section of options on the form were reviewed in full detail and any questions were answered as needed. The form was scanned and sent to medical records for it to be uploaded under ACP tab in Epic. A photocopy was also placed in the chart to be scanned into EMR. The patient outlined their wishes for the following treatment decisions:  Cardiopulmonary Resuscitation: Do Not Attempt Resuscitation (DNR/No CPR)  Medical Interventions: Limited Additional Interventions: Use medical treatment, IV fluids and cardiac monitoring as indicated, DO NOT USE intubation or mechanical ventilation. May consider use of less invasive airway support such as BiPAP or CPAP. Also provide comfort measures. Transfer to the hospital if indicated. Avoid intensive care.   Antibiotics: Antibiotics if  indicated  IV Fluids: IV fluids if indicated  Feeding Tube: No feeding tube     Length of Stay: 7  Current Medications: Scheduled Meds:   aspirin  EC  81 mg Oral Daily   atorvastatin   40 mg Oral QHS   budesonide -glycopyrrolate -formoterol   2 puff Inhalation BID   carvedilol   6.25 mg Oral BID WC   citalopram   20 mg Oral Daily   enoxaparin  (LOVENOX ) injection  30 mg Subcutaneous Q24H   feeding supplement (NEPRO CARB STEADY)  237 mL Oral BID BM   ferrous sulfate   325 mg Oral Q48H  gabapentin   300 mg Oral QHS   hydrALAZINE   10 mg Oral Q8H   insulin  aspart  0-15 Units Subcutaneous TID WC   insulin  aspart  0-5 Units Subcutaneous QHS   insulin  aspart  7 Units Subcutaneous TID WC   insulin  glargine-yfgn  7 Units Subcutaneous QHS   ipratropium-albuterol   3 mL Nebulization BID   isosorbide  mononitrate  30 mg Oral Daily   [START ON 06/14/2024] multivitamin with minerals  1 tablet Oral Daily   pantoprazole   40 mg Oral BID   polyethylene glycol  17 g Oral Daily   predniSONE   20 mg Oral Q breakfast   tamsulosin   0.8 mg Oral QHS   traZODone   50 mg Oral QHS    Continuous Infusions:  sodium chloride  40 mL/hr at 06/12/24 1617    PRN Meds: acetaminophen  **OR** acetaminophen , ondansetron  **OR** ondansetron  (ZOFRAN ) IV, mouth rinse  Physical Exam Pulmonary:     Effort: Pulmonary effort is normal.  Skin:    General: Skin is warm and dry.  Neurological:     Mental Status: He is alert.             Vital Signs: BP (!) 144/61 (BP Location: Right Arm)   Pulse 60   Temp 98.1 F (36.7 C)   Resp 17   Ht 5' 8.5 (1.74 m)   Wt 76.4 kg   SpO2 98%   BMI 25.24 kg/m  SpO2: SpO2: 98 % O2 Device: O2 Device: Room Air O2 Flow Rate: O2 Flow Rate (L/min): 2 L/min  Intake/output summary:  Intake/Output Summary (Last 24 hours) at 06/13/2024 1559 Last data filed at 06/13/2024 0700 Gross per 24 hour  Intake 720.2 ml  Output 750 ml  Net -29.8 ml   LBM: Last BM Date : 06/12/24 Baseline Weight:  Weight: 64.9 kg Most recent weight: Weight: 76.4 kg   Patient Active Problem List   Diagnosis Date Noted   CHF (congestive heart failure) (HCC) 06/06/2024   Coronary artery disease 01/10/2023   Renal artery stenosis 01/10/2023   Pneumonia 04/18/2022   Acute respiratory failure with hypoxia (HCC) 04/16/2022   Anemia of chronic kidney failure, stage 4 (severe) (HCC) 04/16/2022   Prostate cancer (HCC) 10/28/2021   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/28/2021   Paroxysmal supraventricular tachycardia 10/28/2021   Frequent falls 10/27/2021   Unable to care for self 10/27/2021   History of fracture of right hip 09/2021 10/27/2021   Fever 09/20/2021   Acute on chronic anemia 09/18/2021   Delirium 09/17/2021   Dysphagia 09/16/2021   Other fracture of right femur, initial encounter for closed fracture (HCC) 09/14/2021   Chronic systolic CHF (congestive heart failure) (HCC) 09/14/2021   CKD (chronic kidney disease) stage 4, GFR 15-29 ml/min (HCC) 09/14/2021   Hyperkalemia 09/14/2021   Enterococcus UTI 06/27/2021   Aspiration pneumonia (HCC) 06/27/2021   AKI (acute kidney injury) 06/27/2021   Acute renal failure superimposed on stage 4 chronic kidney disease (HCC)    Malnutrition of moderate degree 06/24/2021   Monocytosis    Acute respiratory failure (HCC) 06/22/2021   COPD exacerbation (HCC) 04/16/2020   CAP (community acquired pneumonia) 01/27/2019   Repeated falls 10/17/2018   Altered mental status 03/09/2016   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/04/2015   Acute encephalopathy 06/11/2015   Insulin  dependent type 2 diabetes mellitus, controlled (HCC) 06/11/2015   HTN (hypertension) 06/11/2015   COPD (chronic obstructive pulmonary disease) (HCC) 06/11/2015   H/O: stroke 06/11/2015  Palliative Care Assessment & Plan   Recommendations/Plan: DNR/DNI.  Amenable to temporary dialysis if absolutely needed.  Patient has  advised that he has step daughter Phillip Reyes is his H POA.    Code Status:    Code Status Orders  (From admission, onward)           Start     Ordered   06/06/24 0942  Full code  Continuous       Question:  By:  Answer:  Consent: discussion documented in EHR   06/06/24 0942           Code Status History     Date Active Date Inactive Code Status Order ID Comments User Context   04/16/2022 0227 04/20/2022 0402 Full Code [id number]  Cleatus Delayne GAILS, MD ED   10/27/2021 2012 11/01/2021 1859 Full Code [id number]  Cleatus Delayne GAILS, MD ED   09/14/2021 1835 09/23/2021 1939 Full Code [id number]  Awanda [place], MD ED   06/22/2021 1558 06/29/2021 2200 Full Code [id number]  Maranda Lonell MATSU, NP ED   11/09/2019 0053 11/11/2019 2012 DNR [id number]  Lawence, Madison LABOR, MD ED   01/28/2019 1335 01/29/2019 1759 DNR [id number]  Runell Redia PARAS, MD Inpatient   03/09/2016 1057 03/09/2016 1534 Full Code [id number]  Almeda Bernard, MD ED   06/11/2015 2343 06/13/2015 1818 Full Code [id number]  Jenel Lenis, MD Inpatient       Thank you for allowing the Palliative Medicine Team to assist in the care of this patient.   Time In: 2:45 Time Out: 3:20 Total Time 35 min Prolonged Time Billed  no       Greater than 50%  of this time was spent counseling and coordinating care related to the above assessment and plan.  Camelia Lewis, NP  Please contact Palliative Medicine Team phone at [phone] for questions and concerns.       "

## 2024-06-13 NOTE — Progress Notes (Signed)
 " PROGRESS NOTE    Phillip Reyes  FMW:[id number] DOB: 1940-08-06 DOA: 06/06/2024 PCP: [facility]    Brief Narrative:   Phillip Reyes is a pleasant 84 y.o. male with medical history significant for COPD not on home oxygen, CHF, DM, HTN,HLD,  GERD, CKD 4 who came into [facility] ED via EMS for shortness of breath.  Patient was started on CPAP for room air saturation of 88% with neb treatment by EMS.  Patient also complained of lower abdominal pain radiating to back, Solu-Medrol  125 mg IV, DuoNeb, 2 g of magnesium  given by EMS.  EMS vitals 243/94, heart rate 62.  Patient stated that he had shortness of breath starting yesterday and he was not able to breathe today.  EMS was called.  He denied any fever, chills, nausea, vomiting, chest pain, palpitations, cough, hemoptysis, hematemesis or melena.    Assessment & Plan:   Principal Problem:   CHF (congestive heart failure) (HCC) Active Problems:   Acute respiratory failure with hypoxia (HCC)   COPD exacerbation (HCC)   Cardiac defibrillator in situ   Insulin  dependent type 2 diabetes mellitus, controlled (HCC)   HTN (hypertension)   H/O: stroke   Prostate cancer (HCC)   Anemia of chronic kidney failure, stage 4 (severe) (HCC)  # HFrEF with acute exacerbation # ICD status # Aortic stenosis Presenting with LE edema and dyspnea, cxr with small effusions and pulm edema, bnp elevated to 10k.   TTE shows EF 40-45, low-normal RV function, mild/mod aortic stenosis.  Breathing improved s/p thoracentesis 2/4 AM 500ccc removed. Transudative. -Diuresis on pause 2/2 AKI.  - cardiology following, ultimately may need dialysis for volume control - coreg  continued, imdur  aded - fluid restrict 1.5 liters  # COPD with acute exacerbation Wheezing improved, Feels breathing is improved.  -Scheduled nebs -Continue home inhalers -Continue steroids  # Acute hypoxic respiratory failure Resolved # Aspiration # Pleural effusion Required bipap on  arrival, now on room air. Dimer wnl for age so PE less likely. CT chest with mild/mod effusions, nothing focal. - s/p thoracentesis 500cc removed from L pleural space.  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  Baseline cr around 3.8, serum creatinine 4.4, Renal u/s no obstruction. May need dialysis, per palliative patient ok with short-term and patient confirms this. Though made him aware he isn't a great candidate for dialysis. - nephrology folloiwng  - continue to hold lisinopril   Anemia of CKD Stable.    # MDD - home celexa   # T2DM Glucose elevated with steroids, better controlled.  - SSI -Continue Long-acting and mealtime insulin    # Hx CVA with residual lef-sided weakness - cont home asa/plavix   # Neuropathy - home gabapentin   # HTN Bp wnl - home lisinopril  on hold  # BPH # History prostate cancer - home flomax   # Debility PT advising snf but family opts for home with home health  # End-of-life care With worsening kidney disease, heart failure, COPD, prior stroke, poor functional status, prognosis is poor. Remains full code full scope. - palliative care consulted today    DVT prophylaxis: lovenox  Code Status: full Family Communication: daughter updated telephonically 2/3 (reports wife is on hospice and is an invalid)  Level of care: Progressive Status is: Inpatient Remains inpatient appropriate because: severity of illness  Consultants:  Cardiology, nephrology   Procedures: none  Antimicrobials:  none    Subjective: Breathing continues  to feel better.   Objective: Vitals:   06/13/24 0610 06/13/24 0830 06/13/24 1151 06/13/24 1607  BP: (!) 126/49 (!) 128/52 (!) 144/61 (!) 99/42  Pulse: 65 60 60 60  Resp: 18 18 17 17   Temp: 98.2 F (36.8 C) 98.3 F (36.8 C) 98.1 F (36.7 C) 98.5 F (36.9 C)  TempSrc: Oral     SpO2: 98% 97% 98% 96%  Weight: 76.4 kg     Height:         Intake/Output Summary (Last 24 hours) at 06/13/2024 1749 Last data filed at 06/13/2024 0700 Gross per 24 hour  Intake 720.2 ml  Output 750 ml  Net -29.8 ml   Filed Weights   06/11/24 0500 06/12/24 0500 06/13/24 0610  Weight: 74.7 kg 72.4 kg 76.4 kg    Examination:   Physical Exam  Constitutional: In no distress.  Cardiovascular: Normal rate, regular rhythm. No lower extremity edema  Pulmonary: Non labored breathing on room air, diffuse wheezing improved, no rales Abdominal: Soft. Non distended and non tender Musculoskeletal: Normal range of motion.     Neurological: Alert and oriented to person, place, and time. Non focal  Skin: Skin is warm and dry.     Data Reviewed: I have personally reviewed following labs and imaging studies  CBC: Recent Labs  Lab 06/07/24 0510 06/10/24 0451 06/13/24 0434  WBC 14.1* 14.9* 16.1*  HGB 11.0* 9.7* 9.3*  HCT 33.5* 30.1* 28.4*  MCV 94.4 96.2 95.6  PLT 248 200 198   Basic Metabolic Panel: Recent Labs  Lab 06/10/24 0451 06/11/24 0431 06/11/24 2125 06/12/24 0510 06/12/24 2232 06/13/24 0434  NA 136 135  --  137 134* 135  K 5.3* 5.7*  --  5.4* 4.9 5.1  CL 100 101  --  100 99 100  CO2 23 24  --  24 22 23   GLUCOSE 219* 199* 399* 224* 290* 206*  BUN 84* 96*  --  110* 112* 112*  CREATININE 4.65* 4.85*  --  4.52* 4.60* 4.43*  CALCIUM  7.9* 8.0*  --  8.0* 7.7* 7.6*  PHOS  --   --   --  4.7*  --   --    GFR: Estimated Creatinine Clearance: 12.4 mL/min (A) (by C-G formula based on SCr of 4.43 mg/dL (H)). Liver Function Tests: Recent Labs  Lab 06/07/24 0510 06/11/24 0431 06/12/24 0510  AST 13* <10*  --   ALT <5 6  --   ALKPHOS 83 73  --   BILITOT 0.4 <0.2  --   PROT 6.4* 5.1*  --   ALBUMIN 3.7 3.2* 3.3*   No results for input(s): LIPASE, AMYLASE in the last 168 hours. No results for input(s): AMMONIA in the last 168 hours. Coagulation Profile: Recent Labs  Lab 06/07/24 0510  INR 1.1   Cardiac Enzymes: No  results for input(s): CKTOTAL, CKMB, CKMBINDEX, TROPONINI in the last 168 hours. BNP (last 3 results) Recent Labs    06/06/24 0713 06/13/24 0434  PROBNP 10,471.0* 3,909.0*   HbA1C: No results for input(s): HGBA1C in the last 72 hours.  CBG: Recent Labs  Lab 06/12/24 2117 06/12/24 2339 06/13/24 0831 06/13/24 1142 06/13/24 1606  GLUCAP 260* 321* 178* 146* 144*   Lipid Profile: No results for input(s): CHOL, HDL, LDLCALC, TRIG, CHOLHDL, LDLDIRECT in the last 72 hours. Thyroid Function Tests: No results for input(s): TSH, T4TOTAL, FREET4, T3FREE, THYROIDAB in the last 72 hours. Anemia Panel: No results for input(s): VITAMINB12, FOLATE, FERRITIN, TIBC, IRON, RETICCTPCT  in the last 72 hours. Urine analysis:    Component Value Date/Time   COLORURINE STRAW (A) 10/28/2021 0635   APPEARANCEUR CLEAR (A) 10/28/2021 0635   APPEARANCEUR Clear 02/17/2013 0013   LABSPEC 1.006 10/28/2021 0635   LABSPEC 1.029 02/17/2013 0013   PHURINE 5.0 10/28/2021 0635   GLUCOSEU NEGATIVE 10/28/2021 0635   GLUCOSEU >=500 02/17/2013 0013   HGBUR NEGATIVE 10/28/2021 0635   BILIRUBINUR NEGATIVE 10/28/2021 0635   BILIRUBINUR Negative 02/17/2013 0013   KETONESUR NEGATIVE 10/28/2021 0635   PROTEINUR NEGATIVE 10/28/2021 0635   NITRITE NEGATIVE 10/28/2021 0635   LEUKOCYTESUR NEGATIVE 10/28/2021 0635   LEUKOCYTESUR Negative 02/17/2013 0013   Sepsis Labs: @LABRCNTIP (procalcitonin:4,lacticidven:4)  ) Recent Results (from the past 240 hours)  Resp panel by RT-PCR (RSV, Flu A&B, Covid) Anterior Nasal Swab     Status: None   Collection Time: 06/06/24  9:36 AM   Specimen: Anterior Nasal Swab  Result Value Ref Range Status   SARS Coronavirus 2 by RT PCR NEGATIVE NEGATIVE Final    Comment: (NOTE) SARS-CoV-2 target nucleic acids are NOT DETECTED.  The SARS-CoV-2 RNA is generally detectable in upper respiratory specimens during the acute phase of infection. The  lowest concentration of SARS-CoV-2 viral copies this assay can detect is 138 copies/mL. A negative result does not preclude SARS-Cov-2 infection and should not be used as the sole basis for treatment or other patient management decisions. A negative result may occur with  improper specimen collection/handling, submission of specimen other than nasopharyngeal swab, presence of viral mutation(s) within the areas targeted by this assay, and inadequate number of viral copies(<138 copies/mL). A negative result must be combined with clinical observations, patient history, and epidemiological information. The expected result is Negative.  Fact Sheet for Patients:  bloggercourse.com  Fact Sheet for Healthcare Providers:  seriousbroker.it  This test is no t yet approved or cleared by the [place]  FDA and  has been authorized for detection and/or diagnosis of SARS-CoV-2 by FDA under an Emergency Use Authorization (EUA). This EUA will remain  in effect (meaning this test can be used) for the duration of the COVID-19 declaration under Section 564(b)(1) of the Act, 21 U.S.C.section 360bbb-3(b)(1), unless the authorization is terminated  or revoked sooner.       Influenza A by PCR NEGATIVE NEGATIVE Final   Influenza B by PCR NEGATIVE NEGATIVE Final    Comment: (NOTE) The Xpert Xpress SARS-CoV-2/FLU/RSV plus assay is intended as an aid in the diagnosis of influenza from Nasopharyngeal swab specimens and should not be used as a sole basis for treatment. Nasal washings and aspirates are unacceptable for Xpert Xpress SARS-CoV-2/FLU/RSV testing.  Fact Sheet for Patients: bloggercourse.com  Fact Sheet for Healthcare Providers: seriousbroker.it  This test is not yet approved or cleared by the [place]  FDA and has been authorized for detection and/or diagnosis of SARS-CoV-2 by FDA under  an Emergency Use Authorization (EUA). This EUA will remain in effect (meaning this test can be used) for the duration of the COVID-19 declaration under Section 564(b)(1) of the Act, 21 U.S.C. section 360bbb-3(b)(1), unless the authorization is terminated or revoked.     Resp Syncytial Virus by PCR NEGATIVE NEGATIVE Final    Comment: (NOTE) Fact Sheet for Patients: bloggercourse.com  Fact Sheet for Healthcare Providers: seriousbroker.it  This test is not yet approved or cleared by the [place]  FDA and has been authorized for detection and/or diagnosis of SARS-CoV-2 by FDA under an Emergency Use Authorization (EUA). This EUA will remain in  effect (meaning this test can be used) for the duration of the COVID-19 declaration under Section 564(b)(1) of the Act, 21 U.S.C. section 360bbb-3(b)(1), unless the authorization is terminated or revoked.  Performed at [facility], [address] [place], [place] [address]          Radiology Studies: DG Chest 1 View Result Date: 06/12/2024 EXAM: 1 VIEW(S) XRAY OF THE CHEST 06/12/2024 08:06:08 PM COMPARISON: 06/11/2024 CLINICAL HISTORY: Aspiration into airway. FINDINGS: LINES, TUBES AND DEVICES: Left subclavian dual lead AICD in place. LUNGS AND PLEURA: Hypoinflation. Diffuse interstitial coarsening. No focal consolidation. Probable small bilateral pleural effusions. No pneumothorax. HEART AND MEDIASTINUM: Aortic arch atherosclerosis. No acute abnormality of the cardiac and mediastinal silhouettes. BONES AND SOFT TISSUES: No acute osseous abnormality. IMPRESSION: 1. Diffuse interstitial coarsening is accentuated by hypoinflation. Superimposed interstitial edema or pneumonia are difficult to exclude. 2. Probable small bilateral pleural effusions. Electronically signed by: Norman Gatlin MD 06/12/2024 08:13 PM EST RP Workstation: [id number]        Scheduled Meds:  aspirin  EC  81  mg Oral Daily   atorvastatin   40 mg Oral QHS   budesonide -glycopyrrolate -formoterol   2 puff Inhalation BID   carvedilol   6.25 mg Oral BID WC   citalopram   20 mg Oral Daily   enoxaparin  (LOVENOX ) injection  30 mg Subcutaneous Q24H   feeding supplement (NEPRO CARB STEADY)  237 mL Oral BID BM   ferrous sulfate   325 mg Oral Q48H   gabapentin   300 mg Oral QHS   hydrALAZINE   10 mg Oral Q8H   insulin  aspart  0-15 Units Subcutaneous TID WC   insulin  aspart  0-5 Units Subcutaneous QHS   insulin  aspart  7 Units Subcutaneous TID WC   insulin  glargine-yfgn  7 Units Subcutaneous QHS   ipratropium-albuterol   3 mL Nebulization BID   isosorbide  mononitrate  30 mg Oral Daily   [START ON 06/14/2024] multivitamin with minerals  1 tablet Oral Daily   pantoprazole   40 mg Oral BID   polyethylene glycol  17 g Oral Daily   predniSONE   20 mg Oral Q breakfast   tamsulosin   0.8 mg Oral QHS   traZODone   50 mg Oral QHS   Continuous Infusions:  sodium chloride  40 mL/hr at 06/12/24 1617     LOS: 7 days    Alban Pepper, MD [facility]  If 7PM-7AM, please contact night-coverage [email or web address] Password [facility] 06/13/2024, 5:49 PM     "

## 2024-06-13 NOTE — Progress Notes (Signed)
 " [facility]  ROUNDING NOTE   Subjective:   Phillip Reyes is a 84 y.o.  male with past medical history of diabetes, hypertension, GERD, anemia, COPD, and chronic systolic heart failure.  Patient presents to the emergency department with shortness of breath and has been admitted for CHF (congestive heart failure) (HCC) [I50.9] COPD exacerbation (HCC) [J44.1] Acute on chronic systolic congestive heart failure (HCC) [I50.23] Acute respiratory failure with hypoxia (HCC) [J96.01]  Patient is known to our practice from previous admissions.  He is currently followed by [facility] nephrology.    Update: Patient seen resting in bed No family present Room air No lower extremity edema Discussed his worsening renal function, patient would like to continue prayer and hold off on dialysis at this time.  Creatinine 4.43 UOP 1.35L  Objective:  Vital signs in last 24 hours:  Temp:  [98 F (36.7 C)-98.3 F (36.8 C)] 98.1 F (36.7 C) (02/06 1151) Pulse Rate:  [60-65] 60 (02/06 1151) Resp:  [17-20] 17 (02/06 1151) BP: (123-144)/(42-61) 144/61 (02/06 1151) SpO2:  [97 %-100 %] 98 % (02/06 1151) Weight:  [76.4 kg] 76.4 kg (02/06 0610)  Weight change: 4 kg Filed Weights   06/11/24 0500 06/12/24 0500 06/13/24 0610  Weight: 74.7 kg 72.4 kg 76.4 kg    Intake/Output: I/O last 3 completed shifts: In: 1797.2 [P.O.:1317; I.V.:480.2] Out: 1350 [Urine:1350]   Intake/Output this shift:  No intake/output data recorded.  Physical Exam: General: NAD  Head: Normocephalic, atraumatic. Moist oral mucosal membranes  Eyes: Anicteric  Lungs:  Diminished, normal effort  Heart: Regular rate and rhythm  Abdomen:  Soft, nontender  Extremities:  No peripheral edema.  Neurologic: Awake, alert, conversant  Skin: Warm,dry, no rash  Access: None    Basic Metabolic Panel: Recent Labs  Lab 06/10/24 0451 06/11/24 0431 06/11/24 2125 06/12/24 0510 06/12/24 2232 06/13/24 0434  NA 136 135  --  137  134* 135  K 5.3* 5.7*  --  5.4* 4.9 5.1  CL 100 101  --  100 99 100  CO2 23 24  --  24 22 23   GLUCOSE 219* 199* 399* 224* 290* 206*  BUN 84* 96*  --  110* 112* 112*  CREATININE 4.65* 4.85*  --  4.52* 4.60* 4.43*  CALCIUM  7.9* 8.0*  --  8.0* 7.7* 7.6*  PHOS  --   --   --  4.7*  --   --     Liver Function Tests: Recent Labs  Lab 06/07/24 0510 06/11/24 0431 06/12/24 0510  AST 13* <10*  --   ALT <5 6  --   ALKPHOS 83 73  --   BILITOT 0.4 <0.2  --   PROT 6.4* 5.1*  --   ALBUMIN 3.7 3.2* 3.3*   No results for input(s): LIPASE, AMYLASE in the last 168 hours. No results for input(s): AMMONIA in the last 168 hours.  CBC: Recent Labs  Lab 06/07/24 0510 06/10/24 0451 06/13/24 0434  WBC 14.1* 14.9* 16.1*  HGB 11.0* 9.7* 9.3*  HCT 33.5* 30.1* 28.4*  MCV 94.4 96.2 95.6  PLT 248 200 198    Cardiac Enzymes: No results for input(s): CKTOTAL, CKMB, CKMBINDEX, TROPONINI in the last 168 hours.  BNP: Invalid input(s): POCBNP  CBG: Recent Labs  Lab 06/12/24 1605 06/12/24 2117 06/12/24 2339 06/13/24 0831 06/13/24 1142  GLUCAP 302* 260* 321* 178* 146*    Microbiology: Results for orders placed or performed during the hospital encounter of 06/06/24  Resp panel by RT-PCR (  RSV, Flu A&B, Covid) Anterior Nasal Swab     Status: None   Collection Time: 06/06/24  9:36 AM   Specimen: Anterior Nasal Swab  Result Value Ref Range Status   SARS Coronavirus 2 by RT PCR NEGATIVE NEGATIVE Final    Comment: (NOTE) SARS-CoV-2 target nucleic acids are NOT DETECTED.  The SARS-CoV-2 RNA is generally detectable in upper respiratory specimens during the acute phase of infection. The lowest concentration of SARS-CoV-2 viral copies this assay can detect is 138 copies/mL. A negative result does not preclude SARS-Cov-2 infection and should not be used as the sole basis for treatment or other patient management decisions. A negative result may occur with  improper specimen  collection/handling, submission of specimen other than nasopharyngeal swab, presence of viral mutation(s) within the areas targeted by this assay, and inadequate number of viral copies(<138 copies/mL). A negative result must be combined with clinical observations, patient history, and epidemiological information. The expected result is Negative.  Fact Sheet for Patients:  bloggercourse.com  Fact Sheet for Healthcare Providers:  seriousbroker.it  This test is no t yet approved or cleared by the [place]  FDA and  has been authorized for detection and/or diagnosis of SARS-CoV-2 by FDA under an Emergency Use Authorization (EUA). This EUA will remain  in effect (meaning this test can be used) for the duration of the COVID-19 declaration under Section 564(b)(1) of the Act, 21 U.S.C.section 360bbb-3(b)(1), unless the authorization is terminated  or revoked sooner.       Influenza A by PCR NEGATIVE NEGATIVE Final   Influenza B by PCR NEGATIVE NEGATIVE Final    Comment: (NOTE) The Xpert Xpress SARS-CoV-2/FLU/RSV plus assay is intended as an aid in the diagnosis of influenza from Nasopharyngeal swab specimens and should not be used as a sole basis for treatment. Nasal washings and aspirates are unacceptable for Xpert Xpress SARS-CoV-2/FLU/RSV testing.  Fact Sheet for Patients: bloggercourse.com  Fact Sheet for Healthcare Providers: seriousbroker.it  This test is not yet approved or cleared by the [place]  FDA and has been authorized for detection and/or diagnosis of SARS-CoV-2 by FDA under an Emergency Use Authorization (EUA). This EUA will remain in effect (meaning this test can be used) for the duration of the COVID-19 declaration under Section 564(b)(1) of the Act, 21 U.S.C. section 360bbb-3(b)(1), unless the authorization is terminated or revoked.     Resp Syncytial  Virus by PCR NEGATIVE NEGATIVE Final    Comment: (NOTE) Fact Sheet for Patients: bloggercourse.com  Fact Sheet for Healthcare Providers: seriousbroker.it  This test is not yet approved or cleared by the [place]  FDA and has been authorized for detection and/or diagnosis of SARS-CoV-2 by FDA under an Emergency Use Authorization (EUA). This EUA will remain in effect (meaning this test can be used) for the duration of the COVID-19 declaration under Section 564(b)(1) of the Act, 21 U.S.C. section 360bbb-3(b)(1), unless the authorization is terminated or revoked.  Performed at [facility], [address] [place], [place] [address]     Coagulation Studies: No results for input(s): LABPROT, INR in the last 72 hours.  Urinalysis: No results for input(s): COLORURINE, LABSPEC, PHURINE, GLUCOSEU, HGBUR, BILIRUBINUR, KETONESUR, PROTEINUR, UROBILINOGEN, NITRITE, LEUKOCYTESUR in the last 72 hours.  Invalid input(s): APPERANCEUR    Imaging: DG Chest 1 View Result Date: 06/12/2024 EXAM: 1 VIEW(S) XRAY OF THE CHEST 06/12/2024 08:06:08 PM COMPARISON: 06/11/2024 CLINICAL HISTORY: Aspiration into airway. FINDINGS: LINES, TUBES AND DEVICES: Left subclavian dual lead AICD in place. LUNGS AND PLEURA:  Hypoinflation. Diffuse interstitial coarsening. No focal consolidation. Probable small bilateral pleural effusions. No pneumothorax. HEART AND MEDIASTINUM: Aortic arch atherosclerosis. No acute abnormality of the cardiac and mediastinal silhouettes. BONES AND SOFT TISSUES: No acute osseous abnormality. IMPRESSION: 1. Diffuse interstitial coarsening is accentuated by hypoinflation. Superimposed interstitial edema or pneumonia are difficult to exclude. 2. Probable small bilateral pleural effusions. Electronically signed by: Norman Gatlin MD 06/12/2024 08:13 PM EST RP Workstation: [id number]     Medications:     sodium chloride  40 mL/hr at 06/12/24 1617    aspirin  EC  81 mg Oral Daily   atorvastatin   40 mg Oral QHS   budesonide -glycopyrrolate -formoterol   2 puff Inhalation BID   carvedilol   6.25 mg Oral BID WC   citalopram   20 mg Oral Daily   enoxaparin  (LOVENOX ) injection  30 mg Subcutaneous Q24H   feeding supplement (NEPRO CARB STEADY)  237 mL Oral BID BM   ferrous sulfate   325 mg Oral Q48H   gabapentin   300 mg Oral QHS   hydrALAZINE   10 mg Oral Q8H   insulin  aspart  0-15 Units Subcutaneous TID WC   insulin  aspart  0-5 Units Subcutaneous QHS   insulin  aspart  7 Units Subcutaneous TID WC   insulin  glargine-yfgn  7 Units Subcutaneous QHS   ipratropium-albuterol   3 mL Nebulization BID   isosorbide  mononitrate  30 mg Oral Daily   [START ON 06/14/2024] multivitamin with minerals  1 tablet Oral Daily   pantoprazole   40 mg Oral BID   polyethylene glycol  17 g Oral Daily   predniSONE   20 mg Oral Q breakfast   tamsulosin   0.8 mg Oral QHS   traZODone   50 mg Oral QHS   acetaminophen  **OR** acetaminophen , ondansetron  **OR** ondansetron  (ZOFRAN ) IV, mouth rinse  Assessment/ Plan:  Phillip Reyes is a 84 y.o.  male with past medical history of diabetes, hypertension, anemia, COPD, GERD and chronic systolic heart failure. 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  Acute kidney injury secondary to cardiorenal vs progression of kidney disease. Renal US  negative for obstruction.    Creatinine remains stable, GFR remains below 15.  BUN 112.  Patient continues to have adequate urine output.  Discussed patient's wishes to initiate renal replacement therapy however he would like  to hold and monitor for now.  Will continue to monitor during this weekend. Lab Results  Component Value Date   CREATININE 4.43 (H) 06/13/2024   CREATININE 4.60 (H) 06/12/2024   CREATININE 4.52 (H) 06/12/2024    Intake/Output Summary (Last 24 hours) at 06/13/2024 1443 Last data filed at 06/13/2024 0700 Gross per 24 hour  Intake 720.2 ml  Output 750 ml  Net -29.8 ml   2. Hyperkalemia, likely secondary to kidney failure. Potassium 5.1 today.  Will continue to monitor for need of additional Lokelma .  3. Anemia of chronic kidney disease Lab Results  Component Value Date   HGB 9.3 (L) 06/13/2024    Hgb acceptable for renal patient.. Will continue to monitor.   4. Chronic systolic heart failure, TTE shows EF 40-45% with mild-mod aoritc stenosis. Diuresis remains held due to kidney injury.    LOS: 7 Yarissa Reining 2/6/20262:43 PM   "

## 2024-06-13 NOTE — Progress Notes (Signed)
 Nutrition Follow-up  DOCUMENTATION CODES:   Not applicable  INTERVENTION:   -MVI with minerals daily -Continue Magic cup BID with meals, each supplement provides 290 kcal and 9 grams of protein  -Continue Nepro Shake po BID, each supplement provides 425 kcal and 19 grams protein  -Continue dysphagia 3 diet with thin liquids   NUTRITION DIAGNOSIS:   Inadequate oral intake related to  (SOB) as evidenced by per patient/family report.  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  2/2- s/p BSE- dysphagia 3 diet with thin liquids 2/4- left sided thoracentesis- 500 ml removed 2/6- s/p BSE- dysphagia 3 diet with thin liquids  Reviewed I/O's: -150 ml x 24 hours and -2.9 L since admission  UOP: 1.4 L x 24 hours   Per RN, patient was having multiple coughing and choking episodes when eating and drinking yesterday. Patient was NPO, pending SLP evaluation. He has since been advanced to a dysphagia 3 diet ewith thin liquids per SLP.   Patient has had good oral intake. Noted meal completions 40-100%. Nepro was ordered by Nephrology on 06/12/24, which patient has been drinking.   Patient sleeping soundly at time of visit. No family at bedside. Patient very lethargic at time of visit. He opened his eyes briefly when name was called, but unable to stay awake long enough to participate in interview.   Nephrology following; no acuter indications for HD at this time. Palliative care also following for goals of care discussions.   Reviewed weights; weight has ranged from 70.5-76.4 kg. Patient with edema and history of CHF with renal impairment, which makes weights difficult to interpret.   Medications reviewed and  include lovenox , ferrous sulfate , neurontin , and miralax .   Per TOC notes, patient is refusing SNF placement. Plan for home with home health services.   Labs reviewed: CBGS: [id number] (inpatient orders for glycemic control are 0-15 units insulin  aspart daily at bedtime, 7 units insulin  aspart TID with meals, and 7 units insulin  glargine-yfgn daily). Noted DM coordinator recommendations (7 units insulin  aspart TID with meals), which have been implemented.   NUTRITION - FOCUSED PHYSICAL EXAM:  Flowsheet Row Most Recent Value  Orbital Region No depletion  Upper Arm Region No depletion  Thoracic and Lumbar Region No depletion  Buccal Region No depletion  Temple Region Mild depletion  Clavicle Bone Region No depletion  Clavicle and Acromion Bone Region No depletion  Scapular Bone Region No depletion  Dorsal Hand Mild depletion  Patellar Region Mild depletion  Anterior Thigh Region Mild depletion  Posterior Calf Region Mild depletion  Edema (RD Assessment) Mild  Hair Reviewed  Eyes Reviewed  Mouth Reviewed  Skin Reviewed  Nails Reviewed    Diet Order:   Diet Order             DIET DYS 3 Room service appropriate? Yes with Assist; Fluid consistency: Thin  Diet effective now                   EDUCATION NEEDS:   Not appropriate for education at this time  Skin:  Skin Assessment: Reviewed RN Assessment  Last BM:  06/11/24 (type 6)  Height:   Ht  Readings from Last 1 Encounters:  06/06/24 5' 8.5 (1.74 m)    Weight:   Wt Readings from Last 1 Encounters:  06/13/24 76.4 kg    Ideal Body Weight:  71.4 kg  BMI:  Body mass index is 25.24 kg/m.  Estimated Nutritional Needs:   Kcal:  1700-1900  Protein:  85-105 grams  Fluid:  >1.7 L/day    Margery ORN, RD, LDN, CDCES Registered Dietitian III Certified Diabetes Care and Education Specialist If unable to reach this RD, please use RD Inpatient group chat on secure chat between hours of 8am-4 pm daily

## 2024-06-25 ENCOUNTER — Ambulatory Visit: Admitting: Family
